# Patient Record
Sex: Female | Born: 1955 | Race: White | Hispanic: No | Marital: Married | Smoking: Former smoker
Health system: Southern US, Community
[De-identification: ages and names within clinical notes are randomized; demographics above are authoritative.]

## PROBLEM LIST (undated history)

## (undated) DIAGNOSIS — G4730 Sleep apnea, unspecified: Secondary | ICD-10-CM

## (undated) DIAGNOSIS — M722 Plantar fascial fibromatosis: Secondary | ICD-10-CM

## (undated) DIAGNOSIS — J189 Pneumonia, unspecified organism: Secondary | ICD-10-CM

## (undated) DIAGNOSIS — F32A Depression, unspecified: Secondary | ICD-10-CM

## (undated) DIAGNOSIS — T7840XA Allergy, unspecified, initial encounter: Secondary | ICD-10-CM

## (undated) DIAGNOSIS — E669 Obesity, unspecified: Secondary | ICD-10-CM

## (undated) DIAGNOSIS — M1990 Unspecified osteoarthritis, unspecified site: Secondary | ICD-10-CM

## (undated) DIAGNOSIS — F419 Anxiety disorder, unspecified: Secondary | ICD-10-CM

## (undated) DIAGNOSIS — D649 Anemia, unspecified: Secondary | ICD-10-CM

## (undated) DIAGNOSIS — J302 Other seasonal allergic rhinitis: Secondary | ICD-10-CM

## (undated) DIAGNOSIS — Z8619 Personal history of other infectious and parasitic diseases: Secondary | ICD-10-CM

## (undated) DIAGNOSIS — I10 Essential (primary) hypertension: Secondary | ICD-10-CM

## (undated) DIAGNOSIS — F329 Major depressive disorder, single episode, unspecified: Secondary | ICD-10-CM

## (undated) DIAGNOSIS — M797 Fibromyalgia: Secondary | ICD-10-CM

## (undated) DIAGNOSIS — A4902 Methicillin resistant Staphylococcus aureus infection, unspecified site: Secondary | ICD-10-CM

## (undated) HISTORY — DX: Essential (primary) hypertension: I10

## (undated) HISTORY — DX: Plantar fascial fibromatosis: M72.2

## (undated) HISTORY — PX: UPPER GASTROINTESTINAL ENDOSCOPY: SHX188

## (undated) HISTORY — DX: Allergy, unspecified, initial encounter: T78.40XA

## (undated) HISTORY — DX: Anxiety disorder, unspecified: F41.9

## (undated) HISTORY — DX: Personal history of other infectious and parasitic diseases: Z86.19

## (undated) HISTORY — PX: TONSILLECTOMY: SUR1361

---

## 1999-04-12 ENCOUNTER — Ambulatory Visit (HOSPITAL_COMMUNITY): Admission: RE | Admit: 1999-04-12 | Discharge: 1999-04-12 | Payer: Self-pay | Admitting: Orthopedic Surgery

## 2000-05-08 ENCOUNTER — Encounter: Payer: Self-pay | Admitting: Orthopedic Surgery

## 2000-05-08 ENCOUNTER — Encounter: Admission: RE | Admit: 2000-05-08 | Discharge: 2000-05-08 | Payer: Self-pay | Admitting: Orthopedic Surgery

## 2000-06-05 ENCOUNTER — Ambulatory Visit (HOSPITAL_COMMUNITY): Admission: RE | Admit: 2000-06-05 | Discharge: 2000-06-05 | Payer: Self-pay | Admitting: Orthopedic Surgery

## 2000-06-13 ENCOUNTER — Encounter: Admission: RE | Admit: 2000-06-13 | Discharge: 2000-07-18 | Payer: Self-pay | Admitting: Orthopedic Surgery

## 2000-10-24 HISTORY — PX: GASTRIC BYPASS: SHX52

## 2001-07-17 ENCOUNTER — Encounter: Payer: Self-pay | Admitting: Internal Medicine

## 2001-07-17 ENCOUNTER — Inpatient Hospital Stay (HOSPITAL_COMMUNITY): Admission: EM | Admit: 2001-07-17 | Discharge: 2001-07-18 | Payer: Self-pay | Admitting: *Deleted

## 2001-07-29 ENCOUNTER — Emergency Department (HOSPITAL_COMMUNITY): Admission: EM | Admit: 2001-07-29 | Discharge: 2001-07-29 | Payer: Self-pay

## 2001-08-08 ENCOUNTER — Encounter: Admission: RE | Admit: 2001-08-08 | Discharge: 2001-08-08 | Payer: Self-pay | Admitting: Family Medicine

## 2001-10-24 HISTORY — PX: SHOULDER ARTHROSCOPY: SHX128

## 2002-02-15 ENCOUNTER — Ambulatory Visit (HOSPITAL_BASED_OUTPATIENT_CLINIC_OR_DEPARTMENT_OTHER): Admission: RE | Admit: 2002-02-15 | Discharge: 2002-02-15 | Payer: Self-pay | Admitting: Orthopedic Surgery

## 2002-10-24 HISTORY — PX: KNEE ARTHROSCOPY: SHX127

## 2003-04-21 ENCOUNTER — Ambulatory Visit (HOSPITAL_BASED_OUTPATIENT_CLINIC_OR_DEPARTMENT_OTHER): Admission: RE | Admit: 2003-04-21 | Discharge: 2003-04-21 | Payer: Self-pay | Admitting: Orthopedic Surgery

## 2003-11-07 ENCOUNTER — Inpatient Hospital Stay (HOSPITAL_COMMUNITY): Admission: RE | Admit: 2003-11-07 | Discharge: 2003-11-12 | Payer: Self-pay | Admitting: Orthopedic Surgery

## 2003-11-07 HISTORY — PX: TOTAL KNEE ARTHROPLASTY: SHX125

## 2003-11-17 ENCOUNTER — Emergency Department (HOSPITAL_COMMUNITY): Admission: EM | Admit: 2003-11-17 | Discharge: 2003-11-17 | Payer: Self-pay | Admitting: Emergency Medicine

## 2004-02-05 ENCOUNTER — Ambulatory Visit: Admission: RE | Admit: 2004-02-05 | Discharge: 2004-02-05 | Payer: Self-pay | Admitting: Orthopedic Surgery

## 2004-03-29 ENCOUNTER — Inpatient Hospital Stay (HOSPITAL_COMMUNITY): Admission: RE | Admit: 2004-03-29 | Discharge: 2004-04-02 | Payer: Self-pay | Admitting: Orthopedic Surgery

## 2004-03-29 HISTORY — PX: TOTAL KNEE ARTHROPLASTY: SHX125

## 2004-11-30 ENCOUNTER — Ambulatory Visit: Payer: Self-pay | Admitting: Internal Medicine

## 2004-12-06 ENCOUNTER — Ambulatory Visit (HOSPITAL_COMMUNITY): Admission: RE | Admit: 2004-12-06 | Discharge: 2004-12-06 | Payer: Self-pay | Admitting: Internal Medicine

## 2004-12-08 ENCOUNTER — Ambulatory Visit: Payer: Self-pay | Admitting: Internal Medicine

## 2005-01-03 ENCOUNTER — Ambulatory Visit: Payer: Self-pay | Admitting: Internal Medicine

## 2005-02-17 ENCOUNTER — Emergency Department (HOSPITAL_COMMUNITY): Admission: EM | Admit: 2005-02-17 | Discharge: 2005-02-17 | Payer: Self-pay | Admitting: Emergency Medicine

## 2005-03-18 ENCOUNTER — Emergency Department (HOSPITAL_COMMUNITY): Admission: EM | Admit: 2005-03-18 | Discharge: 2005-03-18 | Payer: Self-pay | Admitting: *Deleted

## 2006-01-06 ENCOUNTER — Ambulatory Visit (HOSPITAL_BASED_OUTPATIENT_CLINIC_OR_DEPARTMENT_OTHER): Admission: RE | Admit: 2006-01-06 | Discharge: 2006-01-06 | Payer: Self-pay | Admitting: Orthopedic Surgery

## 2006-01-06 HISTORY — PX: ROTATOR CUFF REPAIR W/ DISTAL CLAVICLE EXCISION: SHX2365

## 2008-02-21 ENCOUNTER — Encounter: Admission: RE | Admit: 2008-02-21 | Discharge: 2008-02-21 | Payer: Self-pay

## 2008-05-09 ENCOUNTER — Ambulatory Visit (HOSPITAL_COMMUNITY): Admission: RE | Admit: 2008-05-09 | Discharge: 2008-05-10 | Payer: Self-pay | Admitting: Neurosurgery

## 2008-05-09 HISTORY — PX: ANTERIOR CERVICAL DECOMP/DISCECTOMY FUSION: SHX1161

## 2009-11-26 ENCOUNTER — Other Ambulatory Visit: Payer: Self-pay | Admitting: Internal Medicine

## 2009-11-26 ENCOUNTER — Ambulatory Visit: Payer: Self-pay | Admitting: Internal Medicine

## 2009-11-30 ENCOUNTER — Ambulatory Visit: Payer: Self-pay | Admitting: Internal Medicine

## 2009-12-03 ENCOUNTER — Ambulatory Visit: Payer: Self-pay | Admitting: Internal Medicine

## 2009-12-07 ENCOUNTER — Ambulatory Visit: Payer: Self-pay | Admitting: Specialist

## 2009-12-08 ENCOUNTER — Ambulatory Visit: Payer: Self-pay | Admitting: Internal Medicine

## 2009-12-10 ENCOUNTER — Ambulatory Visit: Payer: Self-pay | Admitting: Internal Medicine

## 2009-12-16 ENCOUNTER — Ambulatory Visit: Payer: Self-pay | Admitting: Internal Medicine

## 2009-12-17 ENCOUNTER — Ambulatory Visit: Payer: Self-pay | Admitting: Internal Medicine

## 2009-12-23 ENCOUNTER — Ambulatory Visit: Payer: Self-pay | Admitting: Internal Medicine

## 2009-12-24 ENCOUNTER — Ambulatory Visit: Payer: Self-pay | Admitting: Internal Medicine

## 2009-12-31 ENCOUNTER — Ambulatory Visit: Payer: Self-pay | Admitting: Internal Medicine

## 2010-07-23 ENCOUNTER — Ambulatory Visit (HOSPITAL_COMMUNITY): Admission: RE | Admit: 2010-07-23 | Discharge: 2010-07-23 | Payer: Self-pay | Admitting: Orthopedic Surgery

## 2010-07-30 ENCOUNTER — Inpatient Hospital Stay (HOSPITAL_COMMUNITY): Admission: RE | Admit: 2010-07-30 | Discharge: 2010-08-03 | Payer: Self-pay | Admitting: Orthopedic Surgery

## 2010-07-30 HISTORY — PX: TOTAL KNEE REVISION: SHX996

## 2010-10-24 DIAGNOSIS — G4730 Sleep apnea, unspecified: Secondary | ICD-10-CM

## 2010-10-24 HISTORY — DX: Sleep apnea, unspecified: G47.30

## 2011-01-05 LAB — WOUND CULTURE

## 2011-01-05 LAB — CROSSMATCH: Antibody Screen: NEGATIVE

## 2011-01-05 LAB — BASIC METABOLIC PANEL
BUN: 10 mg/dL (ref 6–23)
BUN: 12 mg/dL (ref 6–23)
BUN: 13 mg/dL (ref 6–23)
BUN: 6 mg/dL (ref 6–23)
CO2: 30 mEq/L (ref 19–32)
CO2: 30 mEq/L (ref 19–32)
Calcium: 8.6 mg/dL (ref 8.4–10.5)
Chloride: 102 mEq/L (ref 96–112)
Chloride: 103 mEq/L (ref 96–112)
Chloride: 104 mEq/L (ref 96–112)
Creatinine, Ser: 0.68 mg/dL (ref 0.4–1.2)
Creatinine, Ser: 0.8 mg/dL (ref 0.4–1.2)
GFR calc Af Amer: >60 mL/min (ref >60)
GFR calc non Af Amer: >60 mL/min (ref >60)
GFR calc non Af Amer: >60 mL/min (ref >60)
Glucose, Bld: 108 mg/dL — ABNORMAL HIGH (ref 70–99)
Glucose, Bld: 112 mg/dL — ABNORMAL HIGH (ref 70–99)
Potassium: 3.9 mEq/L (ref 3.5–5.1)
Potassium: 4.2 mEq/L (ref 3.5–5.1)
Sodium: 137 mEq/L (ref 135–145)

## 2011-01-05 LAB — CBC
HCT: 28.2 % — ABNORMAL LOW (ref 36.0–46.0)
Hemoglobin: 9.4 g/dL — ABNORMAL LOW (ref 12.0–15.0)
MCH: 29.6 pg (ref 26.0–34.0)
MCH: 29.7 pg (ref 26.0–34.0)
MCHC: 30.9 g/dL (ref 30.0–36.0)
MCHC: 31.6 g/dL (ref 30.0–36.0)
MCV: 94.8 fL (ref 78.0–100.0)
MCV: 95.3 fL (ref 78.0–100.0)
Platelets: 286 10*3/uL (ref 150–400)
Platelets: 336 10*3/uL (ref 150–400)
RBC: 3.98 MIL/uL (ref 3.87–5.11)
RDW: 14.3 % (ref 11.5–15.5)
RDW: 14.5 % (ref 11.5–15.5)
RDW: 14.6 % (ref 11.5–15.5)
RDW: 14.8 % (ref 11.5–15.5)
WBC: 6.9 10*3/uL (ref 4.0–10.5)

## 2011-01-05 LAB — ANAEROBIC CULTURE

## 2011-01-05 LAB — GRAM STAIN

## 2011-01-05 LAB — PROTIME-INR: Prothrombin Time: 13.6 seconds (ref 11.6–15.2)

## 2011-01-06 LAB — DIFFERENTIAL
Eosinophils Relative: 6 % — ABNORMAL HIGH (ref 0–5)
Lymphocytes Relative: 38 % (ref 12–46)
Monocytes Absolute: 0.5 10*3/uL (ref 0.1–1.0)
Monocytes Relative: 8 % (ref 3–12)
Neutro Abs: 3.2 10*3/uL (ref 1.7–7.7)

## 2011-01-06 LAB — TYPE AND SCREEN: ABO/RH(D): O POS

## 2011-01-06 LAB — APTT: aPTT: 33 seconds (ref 24–37)

## 2011-01-06 LAB — COMPREHENSIVE METABOLIC PANEL
AST: 25 U/L (ref 0–37)
Albumin: 4.3 g/dL (ref 3.5–5.2)
Alkaline Phosphatase: 86 U/L (ref 39–117)
BUN: 9 mg/dL (ref 6–23)
Chloride: 103 mEq/L (ref 96–112)
GFR calc Af Amer: >60 mL/min (ref >60)
Potassium: 4.3 mEq/L (ref 3.5–5.1)
Total Protein: 6.1 g/dL (ref 6.0–8.3)

## 2011-01-06 LAB — CBC
HCT: 35.8 % — ABNORMAL LOW (ref 36.0–46.0)
Hemoglobin: 11.4 g/dL — ABNORMAL LOW (ref 12.0–15.0)
MCH: 29.6 pg (ref 26.0–34.0)
MCHC: 31.8 g/dL (ref 30.0–36.0)
Platelets: 337 10*3/uL (ref 150–400)

## 2011-01-06 LAB — URINALYSIS, ROUTINE W REFLEX MICROSCOPIC
Glucose, UA: NEGATIVE mg/dL
Nitrite: NEGATIVE
Specific Gravity, Urine: 1.02 (ref 1.005–1.030)
pH: 5.5 (ref 5.0–8.0)

## 2011-01-06 LAB — SURGICAL PCR SCREEN
MRSA, PCR: NEGATIVE
Staphylococcus aureus: POSITIVE — AB

## 2011-03-08 NOTE — Op Note (Signed)
[name], [name]                 ACCOUNT NO.:  [id number]   MEDICAL RECORD NO.:  [id number]          PATIENT TYPE:  OIB   LOCATION:  3599                         FACILITY:  [facility]   PHYSICIAN:  Coletta Memos, M.D.     DATE OF BIRTH:  03-30-1956   DATE OF PROCEDURE:  DATE OF DISCHARGE:                               OPERATIVE REPORT   PREOPERATIVE DIAGNOSES:  Cervical spondylosis C5-6, C6-C7 and cervical  radiculopathy.   POSTOPERATIVE DIAGNOSES:  Cervical spondylosis C5-6, C6-C7 and cervical  radiculopathy.   PROCEDURE:  1. Anterior cervical decompression C5-C6, C6-C7.  2. Arthrodesis C5-C6, 6 mm allograft; C6-C7, 7 mm allograft.  3. Anterior instrumentation, Vector plating with 14-16 mm screws.   COMPLICATIONS:  None.   SURGEON:  Coletta Memos, MD   ASSISTANT:  Stefani Dama, MD   INDICATIONS:  [name] presented with a significant pain in the left  upper extremity along with weakness in the triceps and slightly in her  biceps.  After undergoing extensive conservative treatment, I therefore  offered and she agreed to undergo operative decompression.   OPERATIVE NOTE:  [name] was brought to the operating room, intubated,  and placed under general anesthetic without difficulty.  Head was placed  in essentially neutral position on a horseshoe headrest.  The neck was  prepped.  She was draped in a sterile fashion.  I infiltrated 4 mL 0.5%  lidocaine with 1:200,000 strength epinephrine into the cervical region,  starting at the level of the cricothyroid membrane.  I opened the skin  with a #10 blade, took this down to the platysma.  I opened the platysma  in a horizontal fashion and exposed the strap muscles.  I retracted the  strap muscles medially and the sternocleidomastoid laterally and was  able to delineate an avascular corridor to the cervical spine.  I placed  spinal needle __________.  I placed another spinal needle at both 4-5, 5-  6.  I then reflected the  longus colli muscles bilaterally.  I exposed  both C5-6 and C6-7.  I opened the disk spaces at both levels using a 15  blade.  I did some preliminary curetting.  I then turned my attention to  the C6-7 level.  I placed self-retaining retractors and distraction pins  1 at C6, the other at C7.  I then started with my decompression.   The cervical decompression of the C6-7 was performed using the  microscope, high-speed drill, and Kerrison punches along with curette.  I removed osteophytes and endplate until I was able to fully decompress  both C7 nerve roots on the right and left sides.  I did this and did a  great deal of drilling out in the uncovertebral joints to remove any  bony trapping of the nerve.  After the decompression of the nerve roots  and spinal canal had been completed, I then sized the space for a graft.  I also prepared for arthrodesis by using a barrel-shaped bur to even out  the retrieval bodies at C6 and at C7.   I completed the  arthrodesis by placing a 7-mm allograft at this level.  I achieved hemostasis using Surgifoam.  I then turned my attention to  the C5-6 level.  I removed the distraction pin from C7 and placed at C5.  I removed the self-retaining retractor.  I opened the disk space further  with the distraction pins and then again using curettes, pituitary  rongeurs, and the microscope I completed my decompression of both C6  nerve root and the spinal canal.  Osteophytes again were drilled down  and soft tissue was removed.  Certainly, it was overgrown as it had been  at C6-7.  After completing the decompression, I turned my attention to  the arthrodesis.   I used again Broussard burr to even out the surfaces at C5 and C6.  I  sized the space and placed a 6 mm structural allograft into this space.  I again was able to control bleeding.  I then turned my attention to the  anterior instrumentation.   I placed an anterior plate from U0-A5 using 16 mm screws at  C5, 14 mm  screws at C6-C7.  I used longer screws at C5 secondary to an osteophyte.  I kept the plate off the bone to some degree.  X-ray showed the plate  plug-in screws to be in good position.  I completed this with Dr.  Verlee Rossetti assistance using self-tapping screws at each level, 2 screws  placed at C5, 2 at C6, and 2 at C7.  I then irrigated the wound.  I then  closed wound in layered fashion using Vicryl sutures to reapproximate  the platysma in subcuticular layers.  Dermabond was used for sterile  dressing.  She was extubated moving all extremities well.           ______________________________  Coletta Memos, M.D.     KC/MEDQ  D:  05/09/2008  T:  05/10/2008  Job:  [id number]

## 2011-03-11 NOTE — Discharge Summary (Signed)
[place]  Patient:    [name], [name] Visit Number: [id number] MRN: [id number]          Service Type: MED Location: [id number] Attending Physician:  Doneta Public Dictated by:   Arlis Porta, M.D. Admit Date:  07/16/2001 Disc. Date: 07/18/01   CC:         Evlyn Clines, M.D., [facility]   Discharge Summary  DISCHARGE DIAGNOSES: 1. Surgical complications from gastric bypass and stapling. 2. Nausea and vomiting and dehydration.  SECONDARY DIAGNOSES: 1. Hypertension. 2. Allergic rhinitis. 3. Right rotator cuff problems. 4. She is also status post arthroscopic knee surgery x 3, twice on the left    and one on the right.  CONSULTATIONS:  None during her stay.  PROCEDURES: 1. She had an abdominal x-ray and chest x-ray done on July 17, 2001,    which showed no acute abdominal or pulmonary abnormalities. 2. She had a urinalysis on July 17, 2001, which showed specific gravity    1.023, pH of 6, glucose of 100, moderate bilirubin, 15 ketones, protein    greater than 300.  Urobilinogen 1, nitrate positive.  Small leukocyte    esterase, many epithelial cells, 7-10 white blood cells, 3-6 red blood    cells, many bacteria and hyalin casts.  She had blood cultures x 3 drawn    which were done on July 17, 2001, which showed one of them from her    left PICC line growing positive for gram negative rods and gram positive    cocci.  The other two blood cultures were negative growth to date. 3. The patient also had a CBC and complete metabolic panel drawn on July 17, 2001, which showed slightly elevated LFTs with an AST of 82, and an ALT    of 49, and alk. phos. of 115.  She also had hypokalemia with a potassium    level of 3.1.  Her CBC showed mild anemia at hemoglobin of 11.9, and    hematocrit of 35.  White blood cell count was 9.4.  HOSPITAL COURSE:  This is a 55 year old white female who underwent  gastric bypass stapling procedure in August of 2002, by Dr. Clovis Riley and who has since been put on TNA through a PICC line secondary to inability to tolerate p.o. The patient has nausea and vomiting for several weeks, and has since been getting worse.  She presented to the [facility] Emergency Room with a history of a fever of 101.4, feelings chills, and weak and listless.  The patient was then admitted to rehydrate her with intravenous fluids as well as to rule out any possible sepsis as a cause for her nausea and vomiting. She was also given potassium supplementation in her fluids.  Blood cultures and urine cultures were drawn which showed 1 blood culture positive for gram positive cocci and gram negative rods.  Her PICC line was then taken out of her arm and then she was started on IV Zosyn.  Her nausea was also treated with IV Phenergan with a good result as the patient only vomited once.  She was also given protonix to help relieve possible gastritis.  Throughout her stay she was kept n.p.o. and just maintained on IV fluids.  She remained afebrile and hemodynamically stable with normal and stable vital signs.  It was decided that the best care that she would receive was to be transferred back to [facility]  where she is known by her physicians there.  A phone call to Dr. Evlyn Clines was placed and he agreed to accept her as a patient in Dr. Ledell Peoples absence.  She will be transferred by [facility] and her care will be resumed at [facility].  DISCHARGE MEDICATIONS: 1. Phenergan 12.5 mg IV q6h. p.r.n. for nausea. 2. Toradol 15-30 mg IV q.6h p.r.n. for pain. 3. Zosyn 3.375 grams IV q.6h. 4. Protonix 40 mg IV q. day 5. Tylenol 650 mg per rectum q.6h. p.r.n. for pain and fever. 6. D-5 normal saline with 30 mEq of potassium chloride per liter running at    158 mL/hr.  ACTIVITY:  She is to maintain bed rest with bathroom privileges.  DIET:  She can eat a diet  as tolerated.  WOUND CARE:  FOLLOWUP:  She is being transferred to [facility] and to be taken care of by Dr. Evlyn Clines.  The phone number there is [phone]. Dictated by:   Arlis Porta, M.D. Attending Physician:  Doneta Public DD:  07/18/01 TD:  07/18/01 Job: [id number]

## 2011-03-11 NOTE — Discharge Summary (Signed)
NAMECONSANDRA, [name]                           ACCOUNT NO.:  [id number]   MEDICAL RECORD NO.:  [id number]                   PATIENT TYPE:  INP   LOCATION:  5005                                 FACILITY:  [facility]   PHYSICIAN:  Harvie Junior, M.D.                DATE OF BIRTH:  [birth year]   DATE OF ADMISSION:  03/29/2004  DATE OF DISCHARGE:  04/02/2004                                 DISCHARGE SUMMARY   ADMITTING DIAGNOSES:  1. End-stage degenerative joint disease left knee.  2. 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  3. Hypertension.  4. History of morbid obesity status post gastric bypass surgery August 2002.   DISCHARGE DIAGNOSES:  1. End-stage degenerative joint disease left knee.  2. 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  3. Hypertension.  4. History of morbid obesity status post gastric bypass surgery August 2002.  5. Postoperative blood loss anemia.   PROCEDURES:  Left total knee arthroplasty.   BRIEF HISTORY:  [name] is a 55 year old female who had an on-the-job  injury to both her knees.  She had pain in both knees and subsequently  underwent a right total knee replacement.  She did relatively well from  this, but had continued pain in her left knee with weightbearing.  X-rays of  left knee showed bone on bone changes with complete loss of joint space.  She got temporary relief with left knee arthroscopy.  She had night pain and  pain with ambulation and did not respond to exhaustive conservative  management of the non-operative type.  She was admitted for a left total  knee replacement.   LABORATORIES:  EKG on admission showed normal sinus rhythm.  EKG showed no  active disease.  Hemoglobin on admission was 9.9, hematocrit 30.4.  Indices  were within normal limits.  On postoperative day #1 her hemoglobin was 8.9,  postoperative day #2 7.8, postoperative day #3 8.6, and #4 8.4.  Pro time on  admission was 11.2 seconds with an  INR of 0.7, PTT 32.  On Coumadin therapy  her pro time was up to 15.1 seconds with an INR of 1.3.  CMET on admission  was within normal limits other than decreased calcium, total protein, and  albumin at 8.2, 5.1, and 3.2.  Urinalysis showed no abnormalities.  2 units  packed rbc's were transfused during this hospitalization.   HOSPITAL COURSE:  The patient underwent left total knee arthroplasty as well  described in Dr. Luiz Blare' operative note on March 29, 2004.  Immediately  postoperatively patient had a Marcaine epidural with 10 mL of 0.25% plain  Marcaine.  This improved her pain somewhat.  Her epidural catheter was  placed for pain control.  She was given IV Ancef 1 g q.8h. x5 doses.  On  postoperative day #1 she was gotten out of bed to the chair.  She  did  complain of quite a bit of left knee pain.  She was given her usual  OxyContin dose which she had taken preoperatively which was 80 mg b.i.d.  On  postoperative day #2 she had improvement in knee pain.  Her Foley catheter  which had been placed at time of surgery was intact.  She was out of bed to  the chair.  She had low grade fever of 100.0.  Her vital signs were stable.  Hemoglobin was 7.8.  Her INR was 1.0. Her left knee dressing was changed.  Her Hemovac drain was pulled.  The epidural was discontinued per anesthesia  and 2 units of packed rbc's were transfused.  She was continued with  physical therapy.  Was started on Lovenox six hours after the epidural was  discontinued subcutaneous.  Rehabilitation consult was obtained for possible  admission to inpatient rehabilitation floor.  She made progress with their  therapies.  On postoperative day #4 she had moderate knee pain.  She was  taking fluids, voiding without difficulty.  She was afebrile and vital signs  were stable.  Her hemoglobin was 8.4.  INR was 1.3.  Her saline lines were  discontinued.  She was discharged home in improved condition on a regular  diet.   DISCHARGE  MEDICATIONS:  1. Was given prescription for OxyContin 80 mg b.i.d.  2. OxyContin IR for breakthrough pain one q.6h. p.r.n. pain.  3. Robaxin 750 mg one t.i.d. p.r.n. spasm.  4. Coumadin for DVT prophylaxis x1 month postoperatively.  5. Lovenox 30 mg subcutaneous b.i.d. x3 days.   ACTIVITY:  Ambulating, weightbearing as tolerated on the left with a walker.  She will need home health PT three times a week and home CPM 0 degrees to 50  degrees, increase up to 90 degrees as tolerated.  She will follow up with  Dr. Luiz Blare in two weeks in the office, call sooner if any problems occur.      [name], P.A.                       Harvie Junior, M.D.    [name]  D:  06/01/2004  T:  06/02/2004  Job:  [id number]   cc:   Burnett Kanaris, PA  [facility] [address]

## 2011-03-11 NOTE — Op Note (Signed)
NAMEELAINNA, ESHLEMAN                           ACCOUNT NO.:  [id number]   MEDICAL RECORD NO.:  [id number]                   PATIENT TYPE:  INP   LOCATION:  2112                                 FACILITY:  [facility]   PHYSICIAN:  Harvie Junior, M.D.                DATE OF BIRTH:  Sep 02, 1956   DATE OF PROCEDURE:  11/07/2003  DATE OF DISCHARGE:                                 OPERATIVE REPORT   PREOPERATIVE DIAGNOSIS:  End-stage degenerative joint disease right knee.   POSTOPERATIVE DIAGNOSIS:  End-stage degenerative joint disease right knee.   PROCEDURE:  Right total knee replacement with DePuy LCS instrumentation,  large femur, size 4 cemented keel tibia and a 12.5-mm bridging bearing with  the 38-mm all poly patella.   SURGEON:  Harvie Junior, M.D.   ASSISTANT:  Bradley Ferris, M.D.   ANESTHESIA:  General.   INDICATIONS FOR PROCEDURE:  This is a 55 year old female with a long history  of having bilateral severe degenerative joint disease who ultimately tried  activity modification and injection therapy and anti-inflammatory  medication. None of this worked, and because of continued severe  degenerative joint disease end-stage of both knees, the patient is taken to  the operating room for total knee replacement.   Preoperatively the patient had significant problems with pain control, and  ultimately had been on  OxyContin 60 mg b.i.d., as well as oral pain  relievers, and felt that  there were going to be some issues with pain  control postoperatively, so she was planned for epidural anesthesia and this  was obtained preoperatively.   DESCRIPTION OF PROCEDURE:  The patient was taken to the operating room and  after adequate anesthesia was obtained with a general anesthetic, the  patient was placed on the operating table. Her  right leg was prepped and  draped in the usual sterile fashion.   Following  this a midline incision was made through the subcutaneous tissue  and taken  down to the level of the knee. A medial peripatellar arthrotomy  was then undertaken. The anterior and  posterior cruciates were removed as  well as the medial and lateral  meniscus.   Attention was turned to the tibia where a cut was made with a 10 degree  slope, perpendicular to the long axis of the tibia. At this point attention  was turned towards the femur, where the femur was sized, and then the  flexion gap was measured and  the anterior and posterior cuts were made. The  gaps were then measured and attention was turned towards the chamfer cuts  which were made as well as the anterior cut.   At this point the keel was then cut as well as a long peg hole. At this  point a trial tibia was put in place as well as a trial femur. The flexion-  extension gaps had been matched  during the surgery perfectly, and a 12.5-mm  bridging bearing was put in place. Excellent range of motion was achieved  with no instability.   Attention was turned towards the patella, where it was sized to a 38, and  the patella was then cut down to a level of 14 mm and a 38-mm patella was  put in place. Excellent range of motion was achieved at this point. All the  components were then removed. The knee was copiously irrigated with a pulse  lavage irrigation system.   The components were cemented into place, a large femur, size 4 cemented keel  tibia, 12.5-mm bridging bearing and a 38-mm all poly patella. Excellent fit  and fill with the components was achieved. The leg was held in 10 degrees of  flexion with compression while the cement was allowed to harden.   The patient was then taken through a range of motion and the medial  peripatellar arthrotomy was then closed after medium Hemovac drains were put  in place with #1 Vicryl. The skin was closed with 0 and 2-0 Vicryl and skin  staples. A sterile compressive dressing was applied at this point.   The patient was taken to the recovery room. She was noted to  be in  satisfactory condition. Estimated blood loss was from the procedure was not  significant.                                               Harvie Junior, M.D.    Ranae Plumber  D:  11/09/2003  T:  11/09/2003  Job:  [id number]

## 2011-03-11 NOTE — Op Note (Signed)
NAME:  [name], [name]                           ACCOUNT NO.:  [id number]   MEDICAL RECORD NO.:  [id number]                   PATIENT TYPE:  AMB   LOCATION:  DSC                                  FACILITY:  [facility]   PHYSICIAN:  Harvie Junior, M.D.                DATE OF BIRTH:  [birth year]   DATE OF PROCEDURE:  04/21/2003  DATE OF DISCHARGE:                                 OPERATIVE REPORT   PREOPERATIVE DIAGNOSIS:  Left knee degenerative changes with suspected  patellofemoral and medial compartment disease.   POSTOPERATIVE DIAGNOSIS:  1. Grade 4 chondromalacia medial femoral compartment.  2. Grade 2 and 3 change lateral femoral compartment.  3. Grade 2 and 3 change patellofemoral compartment.   OPERATION PERFORMED:  1. Debridement of medial femoral compartment.  2. Debridement of lateral femoral compartment.  3. Debridement of chondromalacia patella.   SURGEON:  Harvie Junior, M.D.   ASSISTANT:  Marshia Ly, P.A.   ANESTHESIA:  General.   INDICATIONS FOR PROCEDURE:  The patient is a 55 year old female with a long  history of having significant degenerative disease of her left knee.  She  had two previous left knee arthroscopies with some relief.  Because of  relief with these and recent weight loss, the patient was taken to the  operating room for the left knee arthroscopy.  Previous dictation was made  of her right knee arthroscopy.  This is a separate procedure.   DESCRIPTION OF PROCEDURE:  The patient was taken to the operating room and  after adequate anesthesia was obtained with a general anesthetic, the  patient was placed supine on the operating table.  The left leg was prepped  and draped in the usual sterile fashion.  Following this, the routine  arthroscopic examination of the knee revealed that there was obvious grade 4  change in the medial femoral compartment.  This was debrided back to a  smooth and stable rim.  There was some slight tendency towards the  posterior  horn of the meniscus to be pulled in but certainly not enough that I  considered a meniscal tear.  This was contoured down with suction shaver.  Following this, attention was turned to the lateral femoral compartment  where there was a grade 3 change in the lateral femoral compartment.  This  was debrided with  suction shaver back to a smooth and stable rim.  Attention was turned up into the patellofemoral joint where there was grade  two and three changes in the femoral trochlea.  This was debrided back to a  smooth and stable rim.  At this point the knee was copiously irrigated and  suctioned dry.  The arthroscopic  portals were closed with a bandage.  A sterile compressive dressing was  applied.  The patient was taken to the recovery room where she was noted to  be in satisfactory condition.  The estimated blood loss for this procedure  was none.                                                Harvie Junior, M.D.    Ranae Plumber  D:  04/21/2003  T:  04/21/2003  Job:  [id number]

## 2011-03-11 NOTE — Discharge Summary (Signed)
NAMESOPHY, MESLER                           ACCOUNT NO.:  [id number]   MEDICAL RECORD NO.:  [id number]                   PATIENT TYPE:  INP   LOCATION:  5032                                 FACILITY:  [facility]   PHYSICIAN:  Harvie Junior, M.D.                DATE OF BIRTH:  [birth year]   DATE OF ADMISSION:  11/07/2003  DATE OF DISCHARGE:  11/12/2003                                 DISCHARGE SUMMARY   ADMISSION DIAGNOSES:  1. End-stage degenerative joint disease, right knee.  2. End-stage degenerative joint disease, left knee.  3. History of gastric bypass August 2002 secondary to obesity.  4. History of bilateral knee arthroscopies June of 2004.  5. History of right shoulder surgery April of 2003.  6. Postoperative blood loss anemia.  7. Urinary tract infection.   PROCEDURE:  Right total knee arthroplasty, Jodi Geralds, M.D., November 07, 2003.   BRIEF HISTORY:  [name] is a 55 year old female who has a history of a  fall at work. She works with [employer]. She injured both knees  and subsequently had bilateral knee arthroscopies in June of 2004. 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 She got temporary relief with  arthroscopy, then her pain returned to the point where she could not  ambulate. She had night pain and pain with ambulation. We also did injection  therapy including cortisone and viscosupplementation injections. She  continued to have significant pain and disability. Weight bearing x-rays  showed bone on bone in both knees. She was felt to be a candidate for  bilateral knee replacement surgery, and based upon surgeon's decision for  optimal result, we will proceed with right total knee arthroplasty followed  at some point in the further with left total knee arthroplasty. We felt due  to her weight and medical condition that bilateral knee arthroscopy would  present some  inherent risk of suboptimal outcome with knee replacement  surgery.   PERTINENT LABORATORY DATA:  EKG on admission showed normal sinus rhythm with  inferolateral ST depression which had resolved. Hemoglobin on admission was  10.6. On postoperative day #1, hemoglobin was 8.1, on postoperative day #2  9.2, #3 9.5, on date of discharge 9.8. Protime on admission was 12.3 seconds  with an INR of 0.9, PTT was 35. On the day of discharge on Coumadin therapy,  protime was 22.9 seconds with an INR of 2.8. CMET on admission showed a  total protein of 5.9. Otherwise within normal limits. BMET was within normal  limits on postoperative day #1. Urinalysis on admission showed 3 to 6 WBCs  per high powered field and many bacteria.   HOSPITAL COURSE:  The patient underwent right total knee arthroplasty as  well described in Dr. Stacy Gardner operative note on November 07, 2003.  Postoperatively, the patient was  treated with an epidural fentanyl catheter  per anesthesia for pain control. She also was given Ancef 1 g IV q.8h.  prophylactically. CPM machine was used. Physical therapy for walker  ambulation, weight bearing as tolerated on the right side. On postoperative  day #1, she had a hemoglobin of 8.1, hematocrit of 24.1. CPM was increased.  Two units of packed RBCs were transfused due to this anemia which was felt  to be a blood loss anemia. The epidural catheter continued. We started her  on Coumadin on postoperative day #1 for DVT prophylaxis, and once the  epidural was pulled six hours later, the subcu Lovenox was started for DVT  prophylaxis. On postoperative day #2, she was afebrile and vital signs were  stable. Her hemoglobin was 9.2. Her dressing was changed, and Hemovac drain  was pulled. Therapy was advanced. The patient initially was admitted to ICU  for medical management, and close nursing care was felt to be medically  necessary. Her epidural fentanyl was pulled on postoperative day #2 by   anesthesia. On postop day #3, she was improving. Her INR was 1.6. Her  hemoglobin was 9.5. She had been started on a PCA pump for pain control as  pain control was a difficult problem for her at that time. She was then  converted over to p.o. analgesics. She continued to progress with physical  therapy. The patient continued to progress without any immediate  complications, and on postoperative day #5, November 12, 2003, she was  discharged home. She had complained of right knee pain. She was ambulating  the hall. Her vital signs were stable. She was afebrile. Her anemia was  benign. Her calf was soft. Her INR was 2.8, and her hemoglobin was 9.8. She  was discharged home in improved condition.   ACTIVITY:  _____________ ambulate weight bearing as tolerated on the right  with a knee immobilizer and walker. Home health physical therapy three times  per week was ordered with a home CPM and Coumadin management x1 month for  postoperative DVT prophylaxis.   MEDICATIONS AT DISCHARGE:  1. Lortab 10 mg #60 one to two q.6h. p.r.n. for breakthrough pain.  2. She had OxyContin 60 mg b.i.d. at home which she will continue on.   FOLLOW UP:  She will follow up with Dr. Luiz Blare in two weeks.      Marshia Ly, P.A.                       Harvie Junior, M.D.    Cordelia Pen  D:  12/31/2003  T:  01/02/2004  Job:  [id number]

## 2011-03-11 NOTE — Op Note (Signed)
NAME:  [name], [name]                           ACCOUNT NO.:  [id number]   MEDICAL RECORD NO.:  [id number]                   PATIENT TYPE:  AMB   LOCATION:  DSC                                  FACILITY:  [facility]   PHYSICIAN:  Harvie Junior, M.D.                DATE OF BIRTH:  09/24/1956   DATE OF PROCEDURE:  04/21/2003  DATE OF DISCHARGE:                                 OPERATIVE REPORT   PREOPERATIVE DIAGNOSIS:  Degenerative joint disease of right knee with  suspected medial meniscal tear.   POSTOPERATIVE DIAGNOSIS:  1. 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  2. Chondromalacia patella.  3. Chondromalacia lateral femoral condyle.   OPERATION PERFORMED:  1. Right knee arthroscopy with partial posterior horn medial meniscectomy     with debridement of corresponding medial compartment degenerative     disease.  2. Debridement of lateral femoral condyle condylar disease.  3. Debridement of patellofemoral disease.   SURGEON:  Harvie Junior, M.D.   ASSISTANT:  Marshia Ly, P.A.   ANESTHESIA:  General.   INDICATIONS FOR PROCEDURE:  The patient is a 55 year old female with a long  history of having severe bilateral knee disease.  She ultimately had been  evaluated previously arthroscopically and noted to have some degenerative  changes.  X-rays showed narrowing.  She has had significant pain.  She  ultimately had recently undergone a weight loss procedure and had lost  greater than 100 pounds.  Because of continued complaints of knee pain and  desire not to proceed with knee replacement surgery, the patient was brought  to the operating room for operative knee arthroscopy.   DESCRIPTION OF PROCEDURE:  The patient was brought to the operating room and  after adequate anesthesia was obtained with a general anesthetic, the  patient was placed supine on the operating table.  The right leg was prepped  and draped in the usual sterile  fashion.  Following this, routine  arthroscopic examination of the knee revealed that there were obvious  degenerative changes in the medial compartment.  There was a displaceable  posterior horn medial meniscal tear.  This was debrided back to a smooth and  stable rim and once this was accomplished, attention was turned and the  remaining meniscal rim was contoured down with a suction shaver.  Following  this, attention was turned to the medial compartment where there was grade 4  change over a signficant area in the medial tibial plateau and the medial  femoral condyle.  These were debrided to a smooth and stable rim.  Attention  was turned laterally where there was a large osteochondral defect in the  flexion area of the lateral femoral condyle which was debrided to a smooth  and stable rim.  Attention was then turned to the patellofemoral joint where  there was  noted to be some grade 2 and grade 3 changes in the patellofemoral  joint.  This was debrided to a smooth and stable rim from both the medial  and lateral portal.  At this point the knee was copiously irrigated and  suctioned  dry.  The arthroscopic portals were closed with a bandage, and sterile  compressive dressing was applied.  The patient was taken to the recovery  room where she was noted to be in satisfactory condition.  The estimated  blood loss for this procedure was none.                                                Harvie Junior, M.D.    Ranae Plumber  D:  04/21/2003  T:  04/21/2003  Job:  [id number]

## 2011-03-11 NOTE — H&P (Signed)
[facility]  Patient:    [name], [name] Visit Number: [id number] MRN: [id number]          Service Type: MED Location: 1800 1830 01 Attending Physician:  Carmelina Peal Dictated by:   Theressa Millard, M.D. Admit Date:  07/16/2001   CC:         Urgent Care   History and Physical  HISTORY OF PRESENT ILLNESS:  Ms. Lotspeich is a 55 year old white female admitted with nausea, vomiting, fever, and hypotension.  Her regular physicians are at Urgent Care.  She is morbidly obese.  This is complicated by mild hypertension, treated intermittently with hydrochlorothiazide, knee problems, low back pain, and lower extremity edema.  She had a laparoscopic gastric stapling and gastrojejunal bypass at [facility] [place]) by Dr. Clovis Riley on June 11, 2001.  She went home on June 17, 2001, and was able to consume liquids adequately.  In early September, per instructions, she tried to eat some soft foods.  However, she developed nausea and vomiting, and she has basically been unable to eat or drink due to nausea and vomiting for the past three weeks.  She has been hospitalized since in September for nausea and vomiting, varying blood pressure, but no fever.  Dr. Clovis Riley has done two esophagogastroduodenoscopies but has been unable to find the gastric pouch exit stoma due to edema.  Her last EGD was on July 08, 2001, and she has been on IV Protonix since starting TPN (see below).  She has also been on Reglan for the last few days.  On July 06, 2001, a PICC line was placed.  She has been on nocturnal TPN, receiving 2600 cc over 12 hours.  Until last night this included lipids. However, the last three nights, Saturday, Sunday, and Monday night, she has developed nausea and vomiting.  This has occurred five hours, three hours, and one hour (respectively) after starting TPN.  As indicated above, the formulation included lipids  until last night when lipids were removed.  The infusion was prepared by [facility] in [place], [place], and administered by [employer] R.N.s or the patients husband.  Tonight, in addition to nausea and vomiting, she had chills, temperature of 101.4, felt listless and week, and they decided to come to the hospital.  She denies abdominal pain, although there is some soreness, cough, dyspnea, chest pain, and dysuria.  PAST MEDICAL HISTORY: 1. Hypertension. 2. Allergic rhinitis. 3. Morbid obesity (weight 330 at surgery, down to 300 since then). 4. Right rotator cuff problems.  PAST SURGICAL HISTORY: 1. Laparoscopic gastric stapling and bypass. 2. Arthroscopic knee surgery x 3, twice on the left, once on the right.  MEDICATION PRIOR TO SURGERY:  Zyrtec, Vioxx, hydrochlorothiazide.  ALLERGIES:  None known.  SOCIAL HISTORY:  Smoking, quit 15-20 years ago.  Alcohol, less than 1 per month.  She is a Geophysicist/field seismologist at [employer].  She has a very supportive husband.  She lives in [place].  FAMILY HISTORY:  Mother had a myocardial infarction at age 86, now has an automated implantable cardiac defibrillator.  Father alive and well.  There is obesity in several family members.  REVIEW OF SYSTEMS:  Headaches, which she attributes to her allergies.  No localizing symptoms for source of fever.  All other systems are negative.  PHYSICAL EXAMINATION:  GENERAL:  Obese.  In no acute distress.  SKIN:  Warm and dry.  VITAL SIGNS:  Temperature 98.3, blood pressure  initially 87/31 and up to 124/40 after IV fluids, pulse initially 130 and down to 100 after IV fluids.  HEENT:  Eyes have pupils which are round and reactive to light.  Extraocular movements are intact.  Ears are normal.  Nose and throat unremarkable except for dry mucous membranes.  NECK:  Supple.  Thyroid is not enlarged or tender.  CHEST:  Clear to auscultation and percussion.  CARDIAC:   Regular rhythm and rate with normal S1 and S2, ______ S3 or S4. There is a 2/6 systolic ejection murmur.  ABDOMEN:  Soft, with mild upper abdominal tenderness.  Incisions are healing nicely.  EXTREMITIES:  Without cyanosis, clubbing, or edema.  There is no calf tenderness.  NEUROLOGIC:  Alert and oriented x 3.  Cranial nerves are intact.  Motor is 5/5.  LABORATORY DATA:  UA revealed 7-10 wbcs per high-power field with 3-6 rbcs per high-power field with many squamous cells.  Hemoglobin 11.9, white count 9400, platelets 397,000.  Sodium 142, potassium 3.2, chloride 104, CO2 22, BUN 23, creatinine 1.2.  Chest x-ray and acute abdominal series:  No acute disease.  IMPRESSION: 1. Nausea and vomiting postlaparoscopic gastric stapling and bypass.  This is    presumably due to edema of the exit stoma.  The patient states that    Dr. Clovis Riley thinks this will improve.  Will continue Protonix or similar    PPI for now.  May need to call Dr. Clovis Riley about further evaluation of    this as needed.  The patient will need more TNA, but this has not been    ordered by me yet. 2. Hypotension.  This has already been largely corrected with IV fluids.    Differential diagnosis includes hypovolemia secondary to nausea and    vomiting and inability to receive adequate TNA x 3 days; systemic immune    response syndrome (tachycardia and fever present) although source of    infection not clear.  I suspect this is more hypovolemia than SIRS, but    the fever is concerning. 3. Fever.  Blood cultures x 2 done along with urine culture.  Differential    diagnosis includes UTI, PICC line infection, TNA contamination, abdominal    source postsurgery.  Workup could include culture of TNA, abdominal CT, or    other tests, depending upon the results of her blood cultures.  PICC line    discontinuation should be considered.  She is on Zosyn. 4. Pyuria.  This does show evidence of some contamination.  Urine cultures  are    pending.  5. Hypokalemia.  Potassium added to IV fluids. 6. Morbid obesity. 7. History of hypertension. 8. Allergic rhinitis.  The patient has been pan-cultured, IV fluids have been ordered, Zosyn started. I have discussed this with the family practice teaching service, who will assume care in the morning as the patient is cared for by Urgent Care. Dictated by:   Theressa Millard, M.D. Attending Physician:  Carmelina Peal DD:  07/17/01 TD:  07/17/01 Job: [id number]

## 2011-03-11 NOTE — Op Note (Signed)
NAME:  [name], [name]                           ACCOUNT NO.:  [id number]   MEDICAL RECORD NO.:  [id number]                   PATIENT TYPE:  INP   LOCATION:  5005                                 FACILITY:  [facility]   PHYSICIAN:  Harvie Junior, M.D.                DATE OF BIRTH:  Apr 17, 1956   DATE OF PROCEDURE:  03/29/2004  DATE OF DISCHARGE:                                 OPERATIVE REPORT   PREOPERATIVE DIAGNOSIS:  End stage degenerative joint disease, left knee.   POSTOPERATIVE DIAGNOSIS:  End stage degenerative joint disease, left knee.   OPERATION PERFORMED:  Left total knee replacement with a [employer]  Sigma knee series, a size 3 femur, a size 4 cemented keel tibia, 12.5 mm  bridging bearing and a 38 mm all polyethylene patella.   SURGEON:  Harvie Junior, M.D.   ASSISTANT:  Marshia Ly, P.A.   ANESTHESIA:  General.   INDICATIONS FOR PROCEDURE:  [name] is a 55 year old female with a history  of having had severe end stage degenerative joint disease.  She underwent a  right total knee replacement and tolerated this reasonably well.  Because of  continuing complaints in her left total knee, she is brought back now for  staged total knee replacement.   DESCRIPTION OF PROCEDURE:  The patient was brought to the operating room.  After adequate anesthesia obtained with general anesthetic, the patient was  placed supine on the operating table.  The leg was then prepped and draped  in the usual sterile fashion.  Following this, a midline incision was made.  Subcutaneous tissue dissected down to the level of the extensor mechanism,  medial parapatellar arthrotomy was undertaken.  Medial and lateral  meniscectomy were performed as well as an anterior posterior cruciate  excision.  At this point the tibia was cut perpendicular to its long axis.  Attention was then turned to the femur where a distal femoral cut was made  with a 5 degree valgus alignment.  A 12.5 mm guide  could be put in place and  the patient easily brought into full extension.  Attention was then turned  to the sizing of the femoral guide.  It really was between a 3 and a 4.  We  were concerned about overstuffing her as she had problems with motion on the  opposite side and so we elected to do a size 3 which seemed to fit best in  the medial and lateral direction.  The size 3 guide was then used and  anterior and posterior cuts were made and Chamfers were made.  At this point  the flexion gap was identified and noted to be easily 12.5 mm.  At that  point attention was turned towards the Chamfer cuts which were made.  Anterior posterior cuts were made and the attention was then turned to the  tibia which was then  sized as a 4, cemented and keeled and the keel was  punched in.  The box cut was made on the femur prior to doing this.  At this  point attention was turned to the patella where the patella was cut down to  the level of the patellar and quadriceps tendon.  At this point the 38 mm  patella was chosen and the drill holes were made for this.  Attention was  then turned back to removing all the trial components.  Full range of motion  was achieved without problems, easy full flexion, easy full extension and no  medial lateral instability.  No anterior posterior instability __________  test in flexion.  At this point attention was turned to removing all of the  trial components and the final components were cemented into place.  A size  3 cemented tibia, a size 4 tibia with a polyethylene central portion, a 12.5  mm bridging bearing and a 38 mm all poly patella.  The knee was put through  a range of motion.  Excellent range of motion was achieved.  At this point  the medium Hemovac drain was put in place after all the cement was allowed  to harden and also excess cement was removed with cement tools.  Once this  was allowed to harden, attention was turned towards the closure.  #1   Ethibond closure was taken closing the medial and lateral parapatellar  situation.  The wound was then copiously irrigated and suctioned dry.  Skin  closure with 0 and 2-0 Vicryl and skin with skin staples.  Sterile  compressive dressing was applied as well as a knee immobilizer.  The patient  was taken to the recovery room where she was noted to be in satisfactory  condition.  The estimated blood loss for this procedure was none.                                               Harvie Junior, M.D.    Ranae Plumber  D:  03/29/2004  T:  03/30/2004  Job:  [id number]

## 2011-03-11 NOTE — Op Note (Signed)
[name], [name]                           ACCOUNT NO.:  [id number]   MEDICAL RECORD NO.:  [id number]                   PATIENT TYPE:  INP   LOCATION:  2550                                 FACILITY:  [facility]   PHYSICIAN:  Zenon Mayo, MD            DATE OF BIRTH:  Jun 17, 1956   DATE OF PROCEDURE:  11/07/2003  DATE OF DISCHARGE:                                 OPERATIVE REPORT   PROCEDURE:  Lumbar epidural.   INDICATIONS:  Postop pain management status post total knee replacement.   DESCRIPTION:  Ms. Berry is a 55 year old female with a history of obesity  and chronic pain secondary to degenerative joint disease in her knees.  She  was brought to the operating room today by Dr. Luiz Blare for a right total knee  replacement.  Prior to the procedure, the patient was counseled on various  forms of postoperative pain management, and the patient requested epidural  anesthesia for her postoperative pain control.  Prior to going to the  operating room, the patient was placed in the sitting position in the  holding room and her back was sterilely prepped and draped for epidural  placement.  An 18-gauge Tuohy needle was inserted into the L3-4 interspace  with loss of resistance to air at 9 cm.  The catheter was threaded easily  into the epidural space and was left in the space at 6 cm and taped to the  skin at 16 cm.   After placement of the catheter, there was negative aspiration of heme or  CSF and a negative IV test dose with 3 cc of 2% lidocaine with 1:200,000 epi  was administered.  After the negative IV test dose, an additional 3 cc of  the same solution was given via the epidural.   Over the course of the next 10-15 minutes, the patient described some  sensory changes in her lower extremities.  There was a delay between  placement of the epidural catheter and going back to the operating room for  the procedure, so no more medicine was given at this time.  Upon proceeding  to  the operating room, an additional 12 cc of 2% Xylocaine was administered  via the epidural catheter in 5 cc increments over a period of 15 minutes.  The patient continued to have a sensory block to cold but had no motor  blocked whatsoever.  At this point we decided to proceed with a general  anesthetic for the case and to use the epidural postoperatively.   At the conclusion of the case, the patient had once again no motor block,  and this time she had no sensory block as well.  The patient was  hypertensive and tachycardic upon extubation and it was decided to replace  the epidural at that time.  The patient was placed in the right lateral  decubitus position and sedated with narcotics for pain control  as well as  some propofol.  The epidural space was again located at the L3-4 interspace  with loss of resistance to air at 9 cm.  The epidural catheter once again  threaded easily and was left in the epidural space at 6 cm and taped to the  skin at 16 cm.  There was once again negative aspiration for heme or CSF and  a negative IV test dose was once again administered.  The epidural catheter  was then bolused to a total of 15 cc over the next 20 minutes and once again  dosed incrementally and the epidural catheter was taped into place and the  patient was placed supine.  An epidural infusion of 1/16th percent Marcaine  with 5 mcg per mL of Fentanyl was begun at 15 cc an hour and the patient was  taken to the recovery room in stable fashion.  We will continue to follow  this epidural throughout its course.                                               Zenon Mayo, MD    WEF/MEDQ  D:  11/07/2003  T:  11/08/2003  Job:  [id number]

## 2011-03-11 NOTE — Op Note (Signed)
[facility]  Patient:    [name], [name] Visit Number: [id number] MRN: [id number]          Service Type: DSU Location: [facility] Attending Physician:  Milly Jakob Dictated by:   Harvie Junior, M.D. Proc. 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  POSTOPERATIVE DIAGNOSIS: 1. Anterolateral impingement acromioclavicular joint arthritis with partial    rotator cuff tear. 2. Posterior labral tear.  OPERATION PERFORMED: 1. Anterolateral acromioplasty with debridement of rotator cuff bursal    inflammation. 2. Distal clavicle excision, arthroscopic. 3. Posterolabral debridement from within the glenohumeral joint.  SURGEON:  Harvie Junior, M.D.  ASSISTANT:  Currie Paris. Thedore Mins.  ANESTHESIA:  General.  INDICATIONS FOR PROCEDURE:  The patient is a 55 year old female with a long history of having had anterolateral impingement.  She had an MRI which showed that she had a partial thickness rotator cuff tear but ultimately because of continued complaints of pain, came for acromioplasty.  She was brought to the operating room for this procedure.  DESCRIPTION OF PROCEDURE:  The patient was brought to the operating room where adequate anesthesia was obtained with general anesthetic.  The patient was placed upon the operating table.  The right shoulder was prepped and draped in the usual sterile fashion.  Following this, a routine arthroscopic examination of the shoulder revealed that there was a posterior labral tear.  This was debrided from the anterior portal.  The biceps tendon was well anchored.  The rotator cuff looked pristine from the under surface.  We took a good look posteriorly and did not find any significant posterior rotator cuff tear.  I think this was actually tissue from the  posterior labrum which was projecting up onto the rotator cuff.  The posterior labrum was resected from within the glenohumeral joint.  At this point the cannulas were removed and attention was turned into the subacromial space where a large shoulder cannula was introduced into the subacromial space.  In the subacromial space there was significant anterior more so than lateral impingement, dramatic anterior spurring.  At this point a 6 mm bur was used to resect the anterior portion of the acromion and once the anterior portion of the acromion was resected, attention was turned to the distal clavicle where 15 mm of distal clavicle were excised through the lateral as well as the anterior portal.  At this point the camera was changed to the lateral portal and the cutting block technique was used posteriorly to make sure that there was a flat acromion and the acromion had been flattened nicely.  It was also beveled slightly laterally.  At this point attention was then turned back to the camera to the posterior portal and a significant bursectomy was undertaken as well as removing any remaining bony fragments.  Once the bursectomy was completed, attention was turned back towards the acromioplasty, the distal clavicle excision and the final check was made to make sure that these were in fact done well and the final debridement of the rotator cuff was undertaken.  At this point the shoulder was copiously irrigated and suctioned dry.  The arthroscopic portals were closed with a bandage.  A sterile compressive dressing was applied.  The patient  was taken to the recovery room where she was noted to be in satisfactory condition.  Estimated blood loss for this procedure was none. Dictated by:   Harvie Junior, M.D. Attending Physician:  Milly Jakob DD:  02/15/02 TD:  02/15/02 Job: [id number]

## 2011-03-11 NOTE — Op Note (Signed)
NAMESURAH, PELLEY                 ACCOUNT NO.:  [id number]   MEDICAL RECORD NO.:  [id number]          PATIENT TYPE:  AMB   LOCATION:  DSC                          FACILITY:  [facility]   PHYSICIAN:  Harvie Junior, M.D.   DATE OF BIRTH:  12-14-1955   DATE OF PROCEDURE:  01/06/2006  DATE OF DISCHARGE:                                 OPERATIVE REPORT   PREOPERATIVE DIAGNOSIS:  Rotator cuff tear with impingement.   POSTOPERATIVE DIAGNOSIS:  Rotator cuff tear with impingement from both the  anterolateral acromion as well as the undersurface of the clavicle.   OPERATION PERFORMED:  1.  Miniopen rotator cuff repair of a high grade partial thickness rotator      cuff tear.  2.  Anterolateral acromioplasty.  3.  Distal clavicle resection by way of coplaning.   SURGEON:  Harvie Junior, M.D.   ASSISTANT:  Marshia Ly, P.A.   ANESTHESIA:  General.   INDICATIONS FOR PROCEDURE:  Ms. Agro is a 55 year old female with a long  history of having had significant right shoulder pain.  She has had previous  subacromial decompression and distal clavicle resection.  She began having  increasing complaints of pain.  Injection therapy seemed to help but because  of recurrence of pain she was ultimately taken to the operating room for  subacromial decompression and evaluation as needed.   DESCRIPTION OF PROCEDURE:  The patient was taken to the operating room and  after adequate anesthesia was obtained with general anesthetic, the patient  was placed supine on the operating table and then moved to the beach chair  position.  All bony prominences were well padded.  Attention was turned to  the right shoulder where after routine prep and drape, routine arthroscopic  examination revealed that the glenohumeral joint looked without abnormality  and the ball-socket joint and biceps tendon was within normal limits.  Attention was then turned to the rotator cuff just posterior to the biceps  tendon which  was noted on MRI to have a high grade partial thickness tear.  There were some torn fibers here.  This was debrided minimally with a sucker  shaver and once this was accomplished, this was marked with a Prolene  suture.  Attention was turned now to the glenohumeral joint and the  subacromial space.  There was some impinging acromion which was debrided.  The distal clavicle was also impinging although the distal joint had been  taken, there was some impingement from the clavicle and this was debrided by  way of a clavicular coplane.  Rotator cuff was then identified from the  superior surface when suture placement was identified and we with a probe  could easily put a probe through the cuff at this point and felt that the  high grade partial thickness nature of this tear was that this needed to be  tacked down. At this point the case was abandoned arthroscopically and a  small incision was made over the lateral aspect of the shoulder.  Subcutaneous tissue dissected down to the level of the cuff and the  cuff was  then identified and a rongeur was used to debride out the torn portions of  the cuff and the full nature of the tear was identified.  The undersurface  portions of the cuff were debrided.  The lateral bone was debrided at the  area where the cuff was inserted over the greater tuberosity and once this  was all accomplished and the bed was prepared, an awl was used to create a  pilot hole and then a single 5 mm anchor was used.  The cuff was then  grasped about 17 mm medial to the placement of the anchor and the rotator  cuff was pulled over into this area.  Excellent fixation was achieved with  these #2 FiberWires tied over the anchor and once this was done, single  limbs of this were able to be advanced posteriorly through some cuff pulling  the knots down laterally.  At this point once this had been completed, the  wound was copiously irrigated and suctioned dry. A finger was used to  make  sure there was no impingement on the cuff underneath.  There was none.  Arm  was put through a range of motion, could achieve full range of motion and at  this point the deltoid was closed with 1 Vicryl running suture, subcu with 2-  0 Vicryl and the skin with 3-0 Maxon pull out suture.  Benzoin and Steri-  Strips were applied.  Sterile compressive dressing was applied.  The patient  was taken to the recovery room where she was noted to be in satisfactory  condition.  The estimated blood loss for this procedure was none.      Harvie Junior, M.D.  Electronically Signed     JLG/MEDQ  D:  01/06/2006  T:  01/07/2006  Job:  [id number]

## 2011-07-22 LAB — COMPREHENSIVE METABOLIC PANEL
AST: 21
Albumin: 3.9
Calcium: 9.1
Creatinine, Ser: 0.96
GFR calc Af Amer: >60

## 2011-07-22 LAB — CBC
MCHC: 32.9
MCV: 88.6
Platelets: 404 — ABNORMAL HIGH

## 2012-01-13 ENCOUNTER — Ambulatory Visit: Payer: Self-pay | Admitting: Family Medicine

## 2012-05-09 ENCOUNTER — Other Ambulatory Visit: Payer: Self-pay | Admitting: Orthopedic Surgery

## 2012-05-14 ENCOUNTER — Encounter (HOSPITAL_BASED_OUTPATIENT_CLINIC_OR_DEPARTMENT_OTHER): Payer: Self-pay | Admitting: *Deleted

## 2012-05-15 ENCOUNTER — Encounter (HOSPITAL_BASED_OUTPATIENT_CLINIC_OR_DEPARTMENT_OTHER): Payer: Self-pay | Admitting: *Deleted

## 2012-05-16 ENCOUNTER — Encounter (HOSPITAL_BASED_OUTPATIENT_CLINIC_OR_DEPARTMENT_OTHER): Payer: Self-pay | Admitting: *Deleted
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
## 2012-05-16 NOTE — Progress Notes (Signed)
Did review sleep study and non compliance cpap-ok-may need to stay [facility]

## 2012-05-18 ENCOUNTER — Encounter (HOSPITAL_BASED_OUTPATIENT_CLINIC_OR_DEPARTMENT_OTHER): Admission: RE | Payer: Self-pay | Source: Ambulatory Visit

## 2012-05-18 ENCOUNTER — Ambulatory Visit (HOSPITAL_BASED_OUTPATIENT_CLINIC_OR_DEPARTMENT_OTHER)
Admission: RE | Admit: 2012-05-18 | Payer: BC Managed Care – PPO | Source: Ambulatory Visit | Admitting: Orthopedic Surgery

## 2012-05-18 HISTORY — DX: Anemia, unspecified: D64.9

## 2012-05-18 HISTORY — DX: Sleep apnea, unspecified: G47.30

## 2012-05-18 HISTORY — DX: Depression, unspecified: F32.A

## 2012-05-18 HISTORY — DX: Other seasonal allergic rhinitis: J30.2

## 2012-05-18 HISTORY — DX: Unspecified osteoarthritis, unspecified site: M19.90

## 2012-05-18 HISTORY — DX: Obesity, unspecified: E66.9

## 2012-05-18 HISTORY — DX: Major depressive disorder, single episode, unspecified: F32.9

## 2012-05-18 SURGERY — ARTHROPLASTY, FINGER
Anesthesia: General | Laterality: Left

## 2013-07-16 ENCOUNTER — Telehealth: Payer: Self-pay

## 2013-07-16 ENCOUNTER — Ambulatory Visit (INDEPENDENT_AMBULATORY_CARE_PROVIDER_SITE_OTHER): Payer: BC Managed Care – PPO | Admitting: Physician Assistant

## 2013-07-16 VITALS — BP 122/84 | HR 96 | Temp 98.5°F | Resp 18 | Ht 66.0 in | Wt 256.0 lb

## 2013-07-16 DIAGNOSIS — R51 Headache: Secondary | ICD-10-CM

## 2013-07-16 DIAGNOSIS — R519 Headache, unspecified: Secondary | ICD-10-CM

## 2013-07-16 DIAGNOSIS — S0180XA Unspecified open wound of other part of head, initial encounter: Secondary | ICD-10-CM

## 2013-07-16 NOTE — Telephone Encounter (Signed)
Please advise 

## 2013-07-16 NOTE — Patient Instructions (Signed)
WOUND CARE Please return in 5 days to have your stitches/staples removed or sooner if you have concerns.  Keep area clean and dry for 24 hours. Do not remove bandage, if applied.  After 24 hours, remove bandage and wash wound gently with mild soap and warm water. Reapply a new bandage after cleaning wound, if directed.  Continue daily cleansing with soap and water until stitches/staples are removed.  Do not apply any ointments or creams to the wound while stitches/staples are in place, as this may cause delayed healing.  Notify the office if you experience any of the following signs of infection: Swelling, redness, pus drainage, streaking, fever >101.0 F  Notify the office if you experience excessive bleeding that does not stop after 15-20 minutes of constant, firm pressure.  
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
## 2013-07-16 NOTE — Telephone Encounter (Signed)
PT WAS SEEN BY SARAH EARLIER TODAY AND WOULD LIKE TO HAVE A MUSCLE RELAXER CALLED IN FOR HER PLEASE CALL [phone]   [facility]

## 2013-07-17 ENCOUNTER — Telehealth: Payer: Self-pay

## 2013-07-17 ENCOUNTER — Encounter: Payer: Self-pay | Admitting: Radiology

## 2013-07-17 MED ORDER — CYCLOBENZAPRINE HCL 10 MG PO TABS: 10.0000 mg | ORAL_TABLET | Freq: Three times a day (TID) | ORAL | Status: DC | PRN

## 2013-07-17 NOTE — Telephone Encounter (Signed)
Pt CB and reported that she is having a lot of muscle soreness and tightness in her hip and arm where she has bruising from fall. She actually had some cramping that went down into her thigh from hip which eased off some after taking some K+. Pt has used Flexeril 10 mg several times in the past for muscle spasms/tightness which always helps, but does not have any at this time. She stated that the hydrocodone is helping some but feels that the muscle relaxer would help further.

## 2013-07-17 NOTE — Telephone Encounter (Signed)
This is duplicate message. See other.

## 2013-07-17 NOTE — Telephone Encounter (Signed)
Since she did have a bite, I need to make sure there is no redness or drainage, etc. Do expect swelling/ will try to call again. Line still busy.

## 2013-07-17 NOTE — Telephone Encounter (Signed)
Expect swelling. Called line busy.

## 2013-07-17 NOTE — Telephone Encounter (Signed)
Pt saw Benny Lennert yesterday and had stitches put in by eye.  Woke up and its swollen. Wants to know if she can put a bandage over it to keep it closed. Would also like a muscle relaxer prescribed if possible. Call at [phone].

## 2013-07-17 NOTE — Telephone Encounter (Signed)
I don't see wear a muscle relaxer was discussed at her visit with Sarah, and I am not sure why she would need this for the injury she sustained.  Why does pt want muscle relaxer?

## 2013-07-17 NOTE — Telephone Encounter (Signed)
Called line busy.

## 2013-07-17 NOTE — Telephone Encounter (Signed)
Sent flexeril to pharmacy 

## 2013-07-18 NOTE — Telephone Encounter (Signed)
Notified pt Rx was sent and she stated that the pharm let her know and she slept much better last night after taking the flexeril.

## 2013-07-22 ENCOUNTER — Ambulatory Visit (INDEPENDENT_AMBULATORY_CARE_PROVIDER_SITE_OTHER): Payer: BC Managed Care – PPO | Admitting: Physician Assistant

## 2013-07-22 VITALS — BP 120/92 | HR 86 | Temp 98.7°F | Resp 16 | Ht 66.0 in | Wt 256.0 lb

## 2013-07-22 DIAGNOSIS — I998 Other disorder of circulatory system: Secondary | ICD-10-CM

## 2013-07-22 DIAGNOSIS — R58 Hemorrhage, not elsewhere classified: Secondary | ICD-10-CM

## 2013-07-22 DIAGNOSIS — S0180XA Unspecified open wound of other part of head, initial encounter: Secondary | ICD-10-CM

## 2013-07-22 DIAGNOSIS — M797 Fibromyalgia: Secondary | ICD-10-CM

## 2013-07-22 DIAGNOSIS — S01111D Laceration without foreign body of right eyelid and periocular area, subsequent encounter: Secondary | ICD-10-CM

## 2013-07-22 NOTE — Progress Notes (Signed)
   [address] [place] [address]   Phone [phone]  Subjective:    Patient ID: [name], female    DOB: [birth year], 56 y.o.   MRN: [id number]  HPI Pt presents for suture removal.  The wound is not causing her any discomfort.  She is having pain with her buttocks and leg bruise.  She used ice for the 1st 3 days.   Review of Systems     Objective:   Physical Exam  Vitals reviewed. Constitutional: She appears well-developed and well-nourished.  Pulmonary/Chest: Effort normal and breath sounds normal.  Skin: Skin is warm and dry.     Wound well healed.  Sutures removed without difficulty.  Psychiatric: She has a normal mood and affect. Her behavior is normal. Judgment and thought content normal.       Assessment & Plan:  Wound face - well healed.  Ecchymosis -

## 2013-09-17 DIAGNOSIS — F411 Generalized anxiety disorder: Secondary | ICD-10-CM | POA: Insufficient documentation

## 2013-12-09 DIAGNOSIS — F329 Major depressive disorder, single episode, unspecified: Secondary | ICD-10-CM | POA: Diagnosis present

## 2013-12-09 DIAGNOSIS — F32A Depression, unspecified: Secondary | ICD-10-CM | POA: Diagnosis present

## 2014-03-01 ENCOUNTER — Ambulatory Visit (INDEPENDENT_AMBULATORY_CARE_PROVIDER_SITE_OTHER): Payer: BC Managed Care – PPO | Admitting: Family Medicine

## 2014-03-01 VITALS — BP 134/82 | HR 114 | Temp 102.5°F | Resp 20 | Ht 65.5 in | Wt 248.0 lb

## 2014-03-01 DIAGNOSIS — R300 Dysuria: Secondary | ICD-10-CM

## 2014-03-01 DIAGNOSIS — N12 Tubulo-interstitial nephritis, not specified as acute or chronic: Secondary | ICD-10-CM

## 2014-03-01 DIAGNOSIS — R509 Fever, unspecified: Secondary | ICD-10-CM

## 2014-03-01 LAB — POCT URINALYSIS DIPSTICK
Bilirubin, UA: NEGATIVE
Glucose, UA: NEGATIVE
Ketones, UA: NEGATIVE
Nitrite, UA: NEGATIVE
Spec Grav, UA: 1.02
Urobilinogen, UA: 4
pH, UA: 5.5

## 2014-03-01 LAB — POCT CBC
GRANULOCYTE PERCENT: 81.8 % — AB (ref 37–80)
HCT, POC: 31.3 % — AB (ref 37.7–47.9)
HEMOGLOBIN: 9.9 g/dL — AB (ref 12.2–16.2)
LYMPH, POC: 1.6 (ref 0.6–3.4)
MCH, POC: 29.7 pg (ref 27–31.2)
MCHC: 31.6 g/dL — AB (ref 31.8–35.4)
MCV: 94 fL (ref 80–97)
MID (cbc): 1.2 — AB (ref 0–0.9)
MPV: 8.7 fL (ref 0–99.8)
POC GRANULOCYTE: 12.7 — AB (ref 2–6.9)
POC LYMPH %: 10.6 % (ref 10–50)
POC MID %: 7.6 % (ref 0–12)
Platelet Count, POC: 266 10*3/uL (ref 142–424)
RBC: 3.33 M/uL — AB (ref 4.04–5.48)
RDW, POC: 14 %
WBC: 15.5 10*3/uL — AB (ref 4.6–10.2)

## 2014-03-01 LAB — POCT UA - MICROSCOPIC ONLY
Casts, Ur, LPF, POC: NEGATIVE
Crystals, Ur, HPF, POC: NEGATIVE
Mucus, UA: NEGATIVE
Yeast, UA: NEGATIVE

## 2014-03-01 LAB — POCT WET PREP WITH KOH
KOH PREP POC: NEGATIVE
Trichomonas, UA: NEGATIVE
Yeast Wet Prep HPF POC: NEGATIVE

## 2014-03-01 MED ORDER — CEFTRIAXONE SODIUM 1 G IJ SOLR
1.0000 g | Freq: Once | INTRAMUSCULAR | Status: AC
  Administered 2014-03-01: 1 g via INTRAMUSCULAR

## 2014-03-01 MED ORDER — CIPROFLOXACIN HCL 500 MG PO TABS
500.0000 mg | ORAL_TABLET | Freq: Two times a day (BID) | ORAL | Status: DC
Start: 2014-03-01 — End: 2014-09-26

## 2014-03-01 NOTE — Progress Notes (Signed)
Subjective:   This chart was scribed for Sydney MochaEva N Alaina Donati, MD, by [name], ED Scribe. This patient was seen at 3:15 PM.   Patient ID: [name], female    DOB: Dec 24, 1955, 58 y.o.   MRN: [id number]  Chief Complaint  Patient presents with  . Cough    cough with light green x 3 days.  severe headache with the cough.  ?UTI  lower back pain, painful and buring with urination. 10 lb weight loss this week.     HPI  [name] is a 58 y.o. female who [facility] complaining of a fever which has persisted for four days. She states the first symptom she experienced four days ago was emesis and abdominal pain which was then followed by diarrhea and a fever four days ago. At the clinic her temperature is 102.5 F. She reports the fever and abdominal pain have continued; the emesis and diarrhea have resolved. She has also experienced a mild sore throat, a cough, and a headache which is increased with coughing.  Also, three days ago she developed urinary urgency, frequency, dysuria, cloudiness to her urine, and back pain. The pt reports she has experienced several episodes of urinary incontinence. She denies pelvic pain or vaginal discharge not associated with micturition. She reports she had intercourse with a new partner two weeks ago for the first time.    The pt has used hydrocodone for the pain, but she reports she uses hydrocodone "all the time" and, thus, minimal relief from the hydrocodone. She has also used OTC cough syrup.   She reports she is drinking "tons" of water, Gatorade, coke, and sprite. She has eaten minimally; she has not eaten anything today, but she ate a sausage biscuit yesterday. [name] states she has lost ten pounds in the course of a week.   The pt reports her daughter, who lives with her, has autism and experienced emesis and diarrhea after the pt developed the symptoms.   She denies a h/o swelling to her lower extremities increased from baseline.   She also  denies a h/o chronic UTI or a h/o renal calculi.   [name] voices a h/o chronic constipation, stating she has two BMs a week; she attributes the constipation due to a h/o pain medication for knee surgeries. She denies using a stool softener.   Past Medical History  Diagnosis Date  . Depression   . Arthritis   . Seasonal allergies   . Anemia   . Obese   . Sleep apnea 2012    took test in [place]-told to use cpap-could not ude   Current Outpatient Prescriptions on File Prior to Visit  Medication Sig Dispense Refill  . cetirizine-pseudoephedrine (ZYRTEC-D) 5-120 MG per tablet Take 1 tablet by mouth 2 (two) times daily.      . DULoxetine (CYMBALTA) 60 MG capsule Take 60 mg by mouth 2 (two) times daily.      Marland Kitchen. FLUoxetine (PROZAC) 20 MG capsule Take 20 mg by mouth 4 (four) times daily.      Marland Kitchen. ibuprofen (ADVIL,MOTRIN) 800 MG tablet Take 800 mg by mouth every 8 (eight) hours as needed.      . phentermine (ADIPEX-P) 37.5 MG tablet Take 37.5 mg by mouth daily before breakfast.       No current facility-administered medications on file prior to visit.   No Known Allergies   Review of Systems  Constitutional: Positive for fever, chills, diaphoresis, appetite change, fatigue  and unexpected weight change.  HENT: Positive for sore throat. Negative for ear pain.   Respiratory: Positive for cough.   Cardiovascular: Negative for leg swelling (Baseline swelling).  Gastrointestinal: Positive for nausea, vomiting, abdominal pain, diarrhea and constipation.  Genitourinary: Positive for dysuria, urgency, frequency and pelvic pain.  Musculoskeletal: Positive for back pain.  Neurological: Positive for headaches.   Vitals: BP 134/82  Pulse 114  Temp(Src) 102.5 F (39.2 C) (Oral)  Resp 20  Ht 5' 5.5" (1.664 m)  Wt 248 lb (112.492 kg)  BMI 40.63 kg/m2  SpO2 96%      Objective:   Physical Exam  Nursing note and vitals reviewed. Constitutional: She is oriented to person, place, and time.  She appears well-developed and well-nourished. No distress.  Diaphoretic. Ill-appearing. Clammy.   HENT:  Head: Normocephalic and atraumatic.  Right Ear: Tympanic membrane and external ear normal. No swelling. Tympanic membrane is not injected, not perforated, not erythematous, not retracted and not bulging. No middle ear effusion.  Left Ear: Tympanic membrane and external ear normal. No swelling. Tympanic membrane is not injected, not perforated, not erythematous, not retracted and not bulging.  No middle ear effusion.  Nose: Nose normal. No mucosal edema, rhinorrhea or sinus tenderness.  Mouth/Throat: Posterior oropharyngeal erythema present. No oropharyngeal exudate or posterior oropharyngeal edema.  Mucus membranes very dry.   Eyes: EOM are normal.  Neck: Neck supple. No tracheal deviation present.  Cardiovascular: Regular rhythm.   Murmur heard. 2/6 systolic murmur.  Tachycardic.   Pulmonary/Chest: Effort normal and breath sounds normal. No respiratory distress. She has no wheezes. She has no rales.  Abdominal: Soft. There is tenderness. There is no rebound and no guarding.  Hypoactive bowel sounds.  Bilateral upper quadrant tenderness.   Genitourinary:  No vaginal discharge. Normal vagina and cervix with some mild atrophy of mucosa. No tenderness on bimanual exam.   Musculoskeletal: Normal range of motion.  Left greater than right CVA tenderness.   Lymphadenopathy:       Head (right side): No submandibular and no tonsillar adenopathy present.       Head (left side): No submandibular and no tonsillar adenopathy present.    She has no cervical adenopathy.       Right: No supraclavicular adenopathy present.       Left: No supraclavicular adenopathy present.  Neurological: She is alert and oriented to person, place, and time.  Skin: Skin is warm. She is diaphoretic.  Psychiatric: She has a normal mood and affect. Her behavior is normal.   Results for orders placed in visit on  03/01/14  POCT URINALYSIS DIPSTICK      Result Value Ref Range   Color, UA yellow     Clarity, UA cloudy     Glucose, UA neg     Bilirubin, UA neg     Ketones, UA neg     Spec Grav, UA 1.020     Blood, UA moderate     pH, UA 5.5     Protein, UA 3+     Urobilinogen, UA 4.0     Nitrite, UA neg     Leukocytes, UA large (3+)    POCT UA - MICROSCOPIC ONLY      Result Value Ref Range   WBC, Ur, HPF, POC TNTC     RBC, urine, microscopic 6-12     Bacteria, U Microscopic 2+     Mucus, UA neg     Epithelial cells, urine per micros 2-4  Crystals, Ur, HPF, POC neg     Casts, Ur, LPF, POC neg     Yeast, UA neg    POCT CBC      Result Value Ref Range   WBC 15.5 (*) 4.6 - 10.2 K/uL   Lymph, poc 1.6  0.6 - 3.4   POC LYMPH PERCENT 10.6  10 - 50 %L   MID (cbc) 1.2 (*) 0 - 0.9   POC MID % 7.6  0 - 12 %M   POC Granulocyte 12.7 (*) 2 - 6.9   Granulocyte percent 81.8 (*) 37 - 80 %G   RBC 3.33 (*) 4.04 - 5.48 M/uL   Hemoglobin 9.9 (*) 12.2 - 16.2 g/dL   HCT, POC 96.0 (*) 45.4 - 47.9 %   MCV 94.0  80 - 97 fL   MCH, POC 29.7  27 - 31.2 pg   MCHC 31.6 (*) 31.8 - 35.4 g/dL   RDW, POC 09.8     Platelet Count, POC 266  142 - 424 K/uL   MPV 8.7  0 - 99.8 fL  POCT WET PREP WITH KOH      Result Value Ref Range   Trichomonas, UA Negative     Clue Cells Wet Prep HPF POC 0-1     Epithelial Wet Prep HPF POC 1-3     Yeast Wet Prep HPF POC neg     Bacteria Wet Prep HPF POC 1+     RBC Wet Prep HPF POC 0-1     WBC Wet Prep HPF POC 2-3     KOH Prep POC Negative          Assessment & Plan:  Will treat pt with IV antibiotic and fluids.  Will perform pelvic exam.   Dysuria - Plan: POCT urinalysis dipstick, POCT UA - Microscopic Only, Urine culture, POCT CBC, Comprehensive metabolic panel, POCT Wet Prep with KOH, GC/Chlamydia Probe Amp  Pyelonephritis - Plan: Urine culture, POCT CBC, Comprehensive metabolic panel, cefTRIAXone (ROCEPHIN) injection 1 g  Fever - Plan: Urine culture, POCT CBC,  Comprehensive metabolic panel  Meds ordered this encounter  Medications  . cefTRIAXone (ROCEPHIN) injection 1 g    Sig:     Order Specific Question:  Antibiotic Indication:    Answer:  Other Indication (list below)    Order Specific Question:  Other Indication:    Answer:  Pyelonephritis  . ciprofloxacin (CIPRO) 500 MG tablet    Sig: Take 1 tablet (500 mg total) by mouth 2 (two) times daily.    Dispense:  14 tablet    Refill:  0    I personally performed the services described in this documentation, which was scribed in my presence. The recorded information has been reviewed and considered, and addended by me as needed.  Norberto Sorenson, MD MPH

## 2014-03-01 NOTE — Patient Instructions (Signed)

## 2014-03-02 ENCOUNTER — Telehealth: Payer: Self-pay

## 2014-03-02 ENCOUNTER — Ambulatory Visit (INDEPENDENT_AMBULATORY_CARE_PROVIDER_SITE_OTHER): Payer: BC Managed Care – PPO | Admitting: Family Medicine

## 2014-03-02 VITALS — BP 128/66 | HR 98 | Temp 99.4°F | Resp 18 | Ht 65.0 in | Wt 252.0 lb

## 2014-03-02 DIAGNOSIS — R300 Dysuria: Secondary | ICD-10-CM

## 2014-03-02 DIAGNOSIS — R109 Unspecified abdominal pain: Secondary | ICD-10-CM

## 2014-03-02 DIAGNOSIS — N12 Tubulo-interstitial nephritis, not specified as acute or chronic: Secondary | ICD-10-CM

## 2014-03-02 LAB — POCT URINALYSIS DIPSTICK
BILIRUBIN UA: NEGATIVE
Glucose, UA: NEGATIVE
KETONES UA: NEGATIVE
Nitrite, UA: NEGATIVE
PH UA: 7
Protein, UA: 30
Spec Grav, UA: 1.01
Urobilinogen, UA: 1

## 2014-03-02 LAB — POCT CBC
GRANULOCYTE PERCENT: 75.3 % (ref 37–80)
HEMATOCRIT: 32.8 % — AB (ref 37.7–47.9)
HEMOGLOBIN: 10.2 g/dL — AB (ref 12.2–16.2)
Lymph, poc: 2.1 (ref 0.6–3.4)
MCH, POC: 29.7 pg (ref 27–31.2)
MCHC: 31.1 g/dL — AB (ref 31.8–35.4)
MCV: 95.4 fL (ref 80–97)
MID (cbc): 1.5 — AB (ref 0–0.9)
MPV: 8.3 fL (ref 0–99.8)
POC GRANULOCYTE: 10.8 — AB (ref 2–6.9)
POC LYMPH %: 14.5 % (ref 10–50)
POC MID %: 10.2 %M (ref 0–12)
Platelet Count, POC: 284 10*3/uL (ref 142–424)
RBC: 3.44 M/uL — AB (ref 4.04–5.48)
RDW, POC: 14.2 %
WBC: 14.3 10*3/uL — AB (ref 4.6–10.2)

## 2014-03-02 LAB — COMPREHENSIVE METABOLIC PANEL
ALK PHOS: 141 U/L — AB (ref 39–117)
ALT: 26 U/L (ref 0–35)
AST: 34 U/L (ref 0–37)
Albumin: 3.5 g/dL (ref 3.5–5.2)
BILIRUBIN TOTAL: 0.3 mg/dL (ref 0.2–1.2)
BUN: 17 mg/dL (ref 6–23)
CO2: 22 mEq/L (ref 19–32)
Calcium: 8.3 mg/dL — ABNORMAL LOW (ref 8.4–10.5)
Chloride: 98 mEq/L (ref 96–112)
Creat: 1.28 mg/dL — ABNORMAL HIGH (ref 0.50–1.10)
Glucose, Bld: 119 mg/dL — ABNORMAL HIGH (ref 70–99)
POTASSIUM: 3.4 meq/L — AB (ref 3.5–5.3)
SODIUM: 131 meq/L — AB (ref 135–145)
TOTAL PROTEIN: 5.5 g/dL — AB (ref 6.0–8.3)

## 2014-03-02 LAB — IFOBT (OCCULT BLOOD): IMMUNOLOGICAL FECAL OCCULT BLOOD TEST: NEGATIVE

## 2014-03-02 LAB — POCT UA - MICROSCOPIC ONLY
Casts, Ur, LPF, POC: NEGATIVE
Crystals, Ur, HPF, POC: NEGATIVE
EPITHELIAL CELLS, URINE PER MICROSCOPY: NEGATIVE
Mucus, UA: NEGATIVE
WBC, UR, HPF, POC: NEGATIVE
Yeast, UA: NEGATIVE

## 2014-03-02 MED ORDER — CEFTRIAXONE SODIUM 1 G IJ SOLR
1.0000 g | Freq: Once | INTRAMUSCULAR | Status: AC
  Administered 2014-03-02: 1 g via INTRAMUSCULAR

## 2014-03-02 MED ORDER — FLUCONAZOLE 150 MG PO TABS: 150.0000 mg | ORAL_TABLET | Freq: Once | ORAL | Status: DC

## 2014-03-02 NOTE — Patient Instructions (Signed)
The other possibility as a cause of your abdominal pain is diverticulitis.  If you abdominal pain is not improving or you continue to have abnormal stools and fevers, we may need to add on an additional antibiotic or even consider a CT scan of your abdomen.  Recheck with mi 5/11 after 5 p.m. If not improving. If you are starting to get a little better, make sure you come in 5/12 before you leave town  To make sure you are ok to travel and don't need additional antibiotics.  Pyelonephritis, Adult Pyelonephritis is a kidney infection. In general, there are 2 main types of pyelonephritis:  Infections that come on quickly without any warning (acute pyelonephritis).  Infections that persist for a long period of time (chronic pyelonephritis). CAUSES  Two main causes of pyelonephritis are:  Bacteria traveling from the bladder to the kidney. This is a problem especially in pregnant women. The urine in the bladder can become filled with bacteria from multiple causes, including:  Inflammation of the prostate gland (prostatitis).  Sexual intercourse in females.  Bladder infection (cystitis).  Bacteria traveling from the bloodstream to the tissue part of the kidney. Problems that may increase your risk of getting a kidney infection include:  Diabetes.  Kidney stones or bladder stones.  Cancer.  Catheters placed in the bladder.  Other abnormalities of the kidney or ureter. SYMPTOMS   Abdominal pain.  Pain in the side or flank area.  Fever.  Chills.  Upset stomach.  Blood in the urine (dark urine).  Frequent urination.  Strong or persistent urge to urinate.  Burning or stinging when urinating. DIAGNOSIS  Your caregiver may diagnose your kidney infection based on your symptoms. A urine sample may also be taken. TREATMENT  In general, treatment depends on how severe the infection is.   If the infection is mild and caught early, your caregiver may treat you with oral antibiotics  and send you home.  If the infection is more severe, the bacteria may have gotten into the bloodstream. This will require intravenous (IV) antibiotics and a hospital stay. Symptoms may include:  High fever.  Severe flank pain.  Shaking chills.  Even after a hospital stay, your caregiver may require you to be on oral antibiotics for a period of time.  Other treatments may be required depending upon the cause of the infection. HOME CARE INSTRUCTIONS   Take your antibiotics as directed. Finish them even if you start to feel better.  Make an appointment to have your urine checked to make sure the infection is gone.  Drink enough fluids to keep your urine clear or pale yellow.  Take medicines for the bladder if you have urgency and frequency of urination as directed by your caregiver. SEEK IMMEDIATE MEDICAL CARE IF:   You have a fever or persistent symptoms for more than 2-3 days.  You have a fever and your symptoms suddenly get worse.  You are unable to take your antibiotics or fluids.  You develop shaking chills.  You experience extreme weakness or fainting.  There is no improvement after 2 days of treatment. MAKE SURE YOU:  Understand these instructions.  Will watch your condition.  Will get help right away if you are not doing well or get worse. Document Released: 10/10/2005 Document Revised: 04/10/2012 Document Reviewed: 03/16/2011 [facility] Patient Information 2014 [place], [place].

## 2014-03-02 NOTE — Telephone Encounter (Signed)
Patient called stated she was unable to pick up 2 pills from [facility] last evening due to they closed before she arrived. Patient is to return today to see Dr Clelia CroftShaw however needs to know if she still needs to go and get med from [facility] this morning? Please call back at [phone]

## 2014-03-02 NOTE — Telephone Encounter (Signed)
Per Dr. Clelia CroftShaw pt is to start med tonight because she is going to get more antibiotics when she come in.  Pt can pickup either before or after she comes since she has to come in for more antibiotics.  Pt notified

## 2014-03-03 LAB — GC/CHLAMYDIA PROBE AMP
CT PROBE, AMP APTIMA: NEGATIVE
GC Probe RNA: NEGATIVE

## 2014-03-04 ENCOUNTER — Telehealth: Payer: Self-pay

## 2014-03-04 LAB — URINE CULTURE: Colony Count: >=100000

## 2014-03-04 NOTE — Telephone Encounter (Signed)
Other than being under a ton of stress due to mother. Pt states mother does not have much longer to live. She is headed out to see her.  She will call this weekend to follow up again. She will come in if necessary. She has not had a fever since Sunday. She was feeling weak Monday morning, she took the day off to rest up. She is feeling much better.  She has been getting her appetite back. She has been staying hydrated.

## 2014-03-04 NOTE — Telephone Encounter (Signed)
PT STATED DR SHAW WANTED HER TO COME BACK IN FOR A RECHECK, HOWEVER SHE IS LEAVING TO GO SEE HER MOM WHO IS REALLY SICK AND DR Clelia CroftSHAW KNOWS ABOUT IT PLEASE CALL [phone]

## 2014-03-05 NOTE — Telephone Encounter (Signed)
Advised patient that her urine culture came back and she will be successfully treated by the cipro.  Advised her that Dr. Clelia CroftShaw would like to recheck her about the 18th.  She said she will come back this weekend, probably Sunday [place] patient's mother is very ill.  She would like Dr. Clelia CroftShaw to know this.

## 2014-03-05 NOTE — Telephone Encounter (Signed)
Luckily we got her urine culture results back and her pyelonephritis (kidney infection) will be successfully treated by the cipro.  Rec OV for recheck prior to finishing antibiotic course (so about 5/18) - sooner if worsening.

## 2014-03-07 ENCOUNTER — Telehealth: Payer: Self-pay

## 2014-03-07 DIAGNOSIS — K920 Hematemesis: Secondary | ICD-10-CM | POA: Insufficient documentation

## 2014-03-07 NOTE — Telephone Encounter (Signed)
Called , patient indicates she has vomited bright red blood x4 now has vomited multiple times and this is black, she feels weak. I have advised her to go NOW to the emergency room, she can not wait to be seen until she gets home. She should not drive, she indicates her husband will take her now. To you FYI

## 2014-03-07 NOTE — Telephone Encounter (Signed)
PATIENT WOULD LIKE TO TALK TO DR. SHAW AS SOON AS POSSIBLE. SHE HAS SEEN DR. SHAW WITHIN THE LAST FEW DAYS FOR SOMETHING TOTALLY DIFFERENT. HOWEVER, DR. SHAW SAID IT WOULD BE O.K. FOR HER TO GO SEE HER MOTHER IN [place] WHO  IS VERY SICK. THIS MORNING [name] HAS THROWN UP 4 TIMES AND IT WAS NOTHING BUT BLOOD. SHE JUST NEEDS TO GET DR. SHAW'S ADVICE ON IF SHE SHOULD RETURN BACK HOME? BEST PHONE [phone] (CELL)    MBC

## 2014-04-03 ENCOUNTER — Telehealth: Payer: Self-pay

## 2014-04-03 NOTE — Telephone Encounter (Signed)
Pt sched for 6/19 - 1 wk.

## 2014-04-03 NOTE — Telephone Encounter (Signed)
PATIENT SAYS SHE WANTS TO LET DR Clelia Croft KNOW HOW SHE IS DOING.

## 2014-04-03 NOTE — Telephone Encounter (Signed)
Okay, so I called, her how is she doing?  Called patient she did go to [employer] to see her mother and she Gentil) was in hospital for 6 days due to a GI bleed. She indicates she is going to go back there soon, she has scheduled an appt with you for 6/15. She wants you to be her PCP

## 2014-04-03 NOTE — Telephone Encounter (Signed)
Thanks for the update. I would be happy to be her PCP. Do not have sched appt on 6/15 though but will be in clinic that evening.

## 2014-04-11 ENCOUNTER — Ambulatory Visit: Payer: BC Managed Care – PPO | Admitting: Family Medicine

## 2014-05-03 DIAGNOSIS — T148XXA Other injury of unspecified body region, initial encounter: Secondary | ICD-10-CM | POA: Insufficient documentation

## 2014-05-16 NOTE — Progress Notes (Signed)
Subjective:    Patient ID: [name], female    DOB: 12-May-1956, 58 y.o.   MRN: [id number] This chart was scribed for Levell July. Clelia Croft, MD by Marica Otter, ED Scribe. This patient was seen in room 3 and the patient's care was started at 12:54 PM.   Chief Complaint  Patient presents with  . Follow-up    HPI  HPI Comments: LELIANA Berry is a 58 y.o. female who presents to the [facility] for a recheck. Pt reports she continues to feel poorly, with very little improvement since yesterday, and had a fever of 102 degrees last night. Pt denies a temperature this morning. Pt reports she still has not picked up her Cipro. Pt complains of intermittent back pain; cough; sore throat; and mild, upper abd pain. Pt denies nausea but reports she is unable to eat. Pt reports she had 4 loose, bowel movements after eating a small bowl of mashed potatoes last night.   Summary of Last Visit: Pt was seen in clinic yesterday and diagnsed with Pyelonephritis and had a white count of 15. Given 2L of nl saline and cefTRIAXone (ROCEPHIN) injection 1 g. Instructed to start Cipro but unable to obtain Cipro from pharmacy last night, thus, has not started med yet.   Social Hx: Pt reports her mother is extremely ill and "on her death bed."   Current Outpatient Prescriptions on File Prior to Visit  Medication Sig Dispense Refill  . cetirizine-pseudoephedrine (ZYRTEC-D) 5-120 MG per tablet Take 1 tablet by mouth 2 (two) times daily.      . ciprofloxacin (CIPRO) 500 MG tablet Take 1 tablet (500 mg total) by mouth 2 (two) times daily.  14 tablet  0  . DULoxetine (CYMBALTA) 60 MG capsule Take 60 mg by mouth 2 (two) times daily.      Marland Kitchen FLUoxetine (PROZAC) 20 MG capsule Take 20 mg by mouth 4 (four) times daily.      Marland Kitchen ibuprofen (ADVIL,MOTRIN) 800 MG tablet Take 800 mg by mouth every 8 (eight) hours as needed.      . phentermine (ADIPEX-P) 37.5 MG tablet Take 37.5 mg by mouth daily before breakfast.        No current facility-administered medications on file prior to visit.     Review of Systems  Constitutional: Positive for fever, chills and appetite change (loss of appetite).  HENT: Positive for sore throat.   Respiratory: Positive for cough.       BP 128/66  Pulse 98  Temp(Src) 99.4 F (37.4 C)  Resp 18  Ht 5\' 5"  (1.651 m)  Wt 252 lb (114.306 kg)  BMI 41.93 kg/m2  SpO2 97% Objective:   Physical Exam  Nursing note and vitals reviewed. Constitutional: She is oriented to person, place, and time. She appears well-developed and well-nourished. No distress.  HENT:  Head: Normocephalic and atraumatic.  Eyes: EOM are normal.  Neck: Neck supple. No tracheal deviation present.  Cardiovascular: Normal rate and regular rhythm.   Murmur (2/6 systolic murmur) heard. Pulmonary/Chest: Effort normal and breath sounds normal. No respiratory distress.  Abdominal: Soft. Bowel sounds are normal. There is tenderness (generalized ). There is guarding.  Genitourinary:  Rectal Exam: Non inflamed external hemorrhoids, no blood visible, normal anal tone, no stool in vault.   Musculoskeletal: Normal range of motion.  Neurological: She is alert and oriented to person, place, and time.  Skin: Skin is warm and dry.  Psychiatric: She has a normal mood  and affect. Her behavior is normal.      Results for orders placed in visit on 03/02/14  POCT CBC      Result Value Ref Range   WBC 14.3 (*) 4.6 - 10.2 K/uL   Lymph, poc 2.1  0.6 - 3.4   POC LYMPH PERCENT 14.5  10 - 50 %L   MID (cbc) 1.5 (*) 0 - 0.9   POC MID % 10.2  0 - 12 %M   POC Granulocyte 10.8 (*) 2 - 6.9   Granulocyte percent 75.3  37 - 80 %G   RBC 3.44 (*) 4.04 - 5.48 M/uL   Hemoglobin 10.2 (*) 12.2 - 16.2 g/dL   HCT, POC 29.532.8 (*) 62.137.7 - 47.9 %   MCV 95.4  80 - 97 fL   MCH, POC 29.7  27 - 31.2 pg   MCHC 31.1 (*) 31.8 - 35.4 g/dL   RDW, POC 30.814.2     Platelet Count, POC 284  142 - 424 K/uL   MPV 8.3  0 - 99.8 fL  POCT UA -  MICROSCOPIC ONLY      Result Value Ref Range   WBC, Ur, HPF, POC neg     RBC, urine, microscopic 1-3     Bacteria, U Microscopic trace     Mucus, UA neg     Epithelial cells, urine per micros neg     Crystals, Ur, HPF, POC neg     Casts, Ur, LPF, POC neg     Yeast, UA neg    POCT URINALYSIS DIPSTICK      Result Value Ref Range   Color, UA light yellow     Clarity, UA clear     Glucose, UA neg     Bilirubin, UA neg     Ketones, UA neg     Spec Grav, UA 1.010     Blood, UA trace-lysed     pH, UA 7.0     Protein, UA 30     Urobilinogen, UA 1.0     Nitrite, UA neg     Leukocytes, UA small (1+)    IFOBT (OCCULT BLOOD)      Result Value Ref Range   IFOBT Negative         Assessment & Plan:  1:10 PM-Discussed treatment plan with pt at bedside and pt agreed to plan. Pt advised to return if symptoms worsen or do not improve.  Dysuria - Plan: POCT CBC, POCT UA - Microscopic Only, POCT urinalysis dipstick  Pyelonephritis - Plan: POCT CBC, POCT UA - Microscopic Only, POCT urinalysis dipstick, cefTRIAXone (ROCEPHIN) injection 1 g  Abdominal pain, unspecified site - Plan: IFOBT POC (occult bld, rslt in office)  Meds ordered this encounter  Medications  . cefTRIAXone (ROCEPHIN) injection 1 g    Sig:     Order Specific Question:  Antibiotic Indication:    Answer:  UTI  . fluconazole (DIFLUCAN) 150 MG tablet    Sig: Take 1 tablet (150 mg total) by mouth once. Repeat if needed after 3 days    Dispense:  2 tablet    Refill:  0    I personally performed the services described in this documentation, which was scribed in my presence. The recorded information has been reviewed and considered, and addended by me as needed.  [name], MD MPH

## 2014-06-27 ENCOUNTER — Other Ambulatory Visit (HOSPITAL_COMMUNITY): Payer: Self-pay | Admitting: Rheumatology

## 2014-06-27 ENCOUNTER — Ambulatory Visit (HOSPITAL_COMMUNITY): Payer: BC Managed Care – PPO | Attending: Family Medicine | Admitting: Cardiology

## 2014-06-27 DIAGNOSIS — Z6841 Body Mass Index (BMI) 40.0 and over, adult: Secondary | ICD-10-CM | POA: Diagnosis not present

## 2014-06-27 DIAGNOSIS — I10 Essential (primary) hypertension: Secondary | ICD-10-CM | POA: Insufficient documentation

## 2014-06-27 DIAGNOSIS — R609 Edema, unspecified: Secondary | ICD-10-CM | POA: Insufficient documentation

## 2014-06-27 DIAGNOSIS — E669 Obesity, unspecified: Secondary | ICD-10-CM | POA: Insufficient documentation

## 2014-06-27 DIAGNOSIS — I079 Rheumatic tricuspid valve disease, unspecified: Secondary | ICD-10-CM | POA: Diagnosis not present

## 2014-06-27 DIAGNOSIS — E785 Hyperlipidemia, unspecified: Secondary | ICD-10-CM | POA: Insufficient documentation

## 2014-06-27 NOTE — Progress Notes (Signed)
Echo performed. 

## 2014-08-28 ENCOUNTER — Ambulatory Visit (INDEPENDENT_AMBULATORY_CARE_PROVIDER_SITE_OTHER): Payer: Self-pay

## 2014-08-28 ENCOUNTER — Ambulatory Visit (INDEPENDENT_AMBULATORY_CARE_PROVIDER_SITE_OTHER): Payer: BC Managed Care – PPO | Admitting: Neurology

## 2014-08-28 DIAGNOSIS — G5601 Carpal tunnel syndrome, right upper limb: Secondary | ICD-10-CM

## 2014-08-28 DIAGNOSIS — Z0289 Encounter for other administrative examinations: Secondary | ICD-10-CM

## 2014-08-28 NOTE — Procedures (Signed)
   NCS (NERVE CONDUCTION STUDY) WITH EMG (ELECTROMYOGRAPHY) REPORT   STUDY DATE: November fifth 2015 PATIENT NAME: [name] DOB: 1955/11/04 MRN: [id number]    TECHNOLOGIST: Gearldine ShownLorraine Jones ELECTROMYOGRAPHER: Levert FeinsteinYan, Venna Berberich M.D.  CLINICAL INFORMATION:   58 years old right-handed female, presenting with 3 month history of right hand paresthesia,mainly involving right first 3 fingers, weakness of right hand  On examination: She has mild to moderate right abductor pollicis brevis, opponents weakness, length dependent decreased to pinprick at first 3 finger pads.  FINDINGS: NERVE CONDUCTION STUDY: Right ulnar sensory and motor responses were normal. Right median sensory response showed mild to moderately prolonged peak latency, with normal snap amplitude. Right median motor response showed mild to moderately prolonged distal latency, with preserved C map amplitude, conduction velocity.  NEEDLE ELECTROMYOGRAPHY: Selected needle examination was performed at right upper extremities, right cervical paraspinal muscles  Right abductor pollicis brevis: Increased insertion activity, no spontaneous activity, enlarged complex motor unit potential, with mildly decreased recruitment patterns.  Needle examination of right pronator teres, biceps, triceps, deltoid, extensor digital communis was normal  There was no spontaneous activities at right cervical paraspinal muscles,right C5, C6, C7  IMPRESSION: This is an abnormal study. There is electrodiagnostic evidence of median neuropathy across the right wrist, consistent with moderate right carpal tunnel syndromes. There was no evidence of right cervical radiculopathy.   INTERPRETING PHYSICIAN:   Levert FeinsteinYan, Greenley Martone M.D. Ph.D. [facility] [address], [address] [place], [place] [address] [phone]

## 2014-09-23 ENCOUNTER — Telehealth: Payer: Self-pay

## 2014-09-23 ENCOUNTER — Ambulatory Visit (INDEPENDENT_AMBULATORY_CARE_PROVIDER_SITE_OTHER): Payer: Self-pay | Admitting: Family Medicine

## 2014-09-23 VITALS — BP 138/88 | HR 104 | Temp 97.8°F | Resp 18 | Ht 65.0 in | Wt 269.0 lb

## 2014-09-23 DIAGNOSIS — Z23 Encounter for immunization: Secondary | ICD-10-CM

## 2014-09-23 DIAGNOSIS — R040 Epistaxis: Secondary | ICD-10-CM

## 2014-09-23 DIAGNOSIS — E669 Obesity, unspecified: Secondary | ICD-10-CM

## 2014-09-23 MED ORDER — OXYMETAZOLINE HCL 0.05 % NA SOLN: 3.0000 | Freq: Two times a day (BID) | NASAL | Status: DC | PRN

## 2014-09-23 MED ORDER — PHENTERMINE HCL 37.5 MG PO TABS: 37.5000 mg | ORAL_TABLET | Freq: Every day | ORAL | Status: DC

## 2014-09-23 NOTE — Progress Notes (Signed)
Subjective:  This chart was scribed for Norberto SorensonEva Nikaela Coyne, MD by Carl Bestelina Holson, Medical Scribe. This patient was seen in Room 8 and the patient's care was started at 10:46 AM.   Patient ID: [name], female    DOB: Mar 09, 1956, 58 y.o.   MRN: [id number]  Chief Complaint  Patient presents with  . Epistaxis    3 times in the last 2 days    HPI HPI Comments: [name] is a [age] y.o. female who presents to the [facility] complaining of 3 severe, episodes of epistaxis within the past 2 days.  She states that the episodes have lasted for more than an hour and are only localized to the left nare.  She has not used any nasal sprays but she takes Zyrtec D regularly.  She does not use a humidifier.  She experienced these symptoms before and had a cauterization procedure done.  She denies nasal congestion and sinus pressure or pain as associated symptoms.    Past Medical History  Diagnosis Date  . Depression   . Arthritis   . Seasonal allergies   . Anemia   . Obese   . Sleep apnea 2012    took test in [place]-told to use cpap-could not ude   Past Surgical History  Procedure Laterality Date  . Total knee revision  07/30/2010    right; with lateral release  . Anterior cervical decomp/discectomy fusion  05/09/2008    C5-6, C6-7; with arthrodesis also  . Rotator cuff repair w/ distal clavicle excision  01/06/2006    right  . 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 Total knee arthroplasty  11/07/2003    right  . Shoulder arthroscopy  2003    rt  . Knee arthroscopy  2004    rt and lt knee  . Shoulder arthroscopy  2003    rt  . Tonsillectomy    . Gastric bypass  2002   Family History  Problem Relation Age of Onset  . Heart disease Mother   . Cancer Father    History   Social History  . Marital Status: Married    Spouse Name: N/A    Number of Children: N/A  . Years of Education: N/A   Occupational History  . Not on file.   Social History Main Topics    . Smoking status: Former Smoker    Quit date: 05/16/1981  . Smokeless tobacco: Not on file  . Alcohol Use: 3.0 oz/week    5 Cans of beer per week  . Drug Use: No  . Sexual Activity: Yes   Other Topics Concern  . Not on file   Social History Narrative   No Known Allergies  Review of Systems  HENT: Negative for congestion and sinus pressure.     Objective:  BP 138/88 mmHg  Pulse 104  Temp(Src) 97.8 F (36.6 C) (Oral)  Resp 18  Ht 5\' 5"  (1.651 m)  Wt 269 lb (122.018 kg)  BMI 44.76 kg/m2  SpO2 95% Physical Exam  Constitutional: She is oriented to person, place, and time. She appears well-developed and well-nourished.  HENT:  Head: Normocephalic and atraumatic.  Right Ear: Hearing, tympanic membrane, external ear and ear canal normal.  Left Ear: Hearing, tympanic membrane, external ear and ear canal normal.  Nose: Mucosal edema present.  Mouth/Throat: Oropharynx is clear and moist. No oropharyngeal exudate.  Mild friable area along the left nasal septum.  Eyes: EOM are normal.  Neck: Normal range of motion.  Cardiovascular: Normal rate.   Pulmonary/Chest: Effort normal.  Musculoskeletal: Normal range of motion.  Neurological: She is alert and oriented to person, place, and time.  Skin: Skin is warm and dry.  Psychiatric: She has a normal mood and affect. Her behavior is normal.  Nursing note and vitals reviewed.   Assessment & Plan:   Epistaxis, recurrent - cauterized friable left nasal apex with silver nitrate. Pt tolerated procedure well.  Start intranasal petroleum ointment and humidifier.   Advised patient to use a humidifier and Afrin nasal spray if she encounters another episode of bleeding.   Obesity - pt reports she did really well on adipex prev - reviewed risks and concern for recurrence of weight gain once med is stopped but pt really wants to try again to help her kick off her weightloss journey - ok to do 4 wk trial - needs to have recheck in office  with 5 lb weight loss to continue.  Influenza vaccine administered - Plan: Flu Vaccine QUAD 36+ mos IM  Meds ordered this encounter  Medications  . oxymetazoline (AFRIN NASAL SPRAY) 0.05 % nasal spray    Sig: Place 3 sprays into right nostril 2 (two) times daily as needed (nose bleeds).    Dispense:  30 mL    Refill:  0  . phentermine (ADIPEX-P) 37.5 MG tablet    Sig: Take 1 tablet (37.5 mg total) by mouth daily before breakfast.    Dispense:  30 tablet    Refill:  0    I personally performed the services described in this documentation, which was scribed in my presence. The recorded information has been reviewed and considered, and addended by me as needed.  Norberto SorensonEva Dazia Lippold, MD MPH

## 2014-09-23 NOTE — Telephone Encounter (Signed)
Patient was seen this morning has more questions regarding her treatment.   [phone]

## 2014-09-23 NOTE — Patient Instructions (Signed)
Start a  Cool mist humidifier next to your bed.  Apply vaseline or antibiotic ointment into right nare along the septum every few hours throughout the day. Recheck weight in 1 mo for any additional med refills - you need to loose at least 5 pounds minimum in the next month if you want to continue.  Nosebleed Nosebleeds can be caused by many conditions, including trauma, infections, polyps, foreign bodies, dry mucous membranes or climate, medicines, and air conditioning. Most nosebleeds occur in the front of the nose. Because of this location, most nosebleeds can be controlled by pinching the nostrils gently and continuously for at least 10 to 20 minutes. The long, continuous pressure allows enough time for the blood to clot. If pressure is released during that 10 to 20 minute time period, the process may have to be started again. The nosebleed may stop by itself or quit with pressure, or it may need concentrated heating (cautery) or pressure from packing. HOME CARE INSTRUCTIONS   If your nose was packed, try to maintain the pack inside until your health care provider removes it. If a gauze pack was used and it starts to fall out, gently replace it or cut the end off. Do not cut if a balloon catheter was used to pack the nose. Otherwise, do not remove unless instructed.  Avoid blowing your nose for 12 hours after treatment. This could dislodge the pack or clot and start the bleeding again.  If the bleeding starts again, sit up and bend forward, gently pinching the front half of your nose continuously for 20 minutes.  If bleeding was caused by dry mucous membranes, use over-the-counter saline nasal spray or gel. This will keep the mucous membranes moist and allow them to heal. If you must use a lubricant, choose the water-soluble variety. Use it only sparingly and not within several hours of lying down.  Do not use petroleum jelly or mineral oil, as these may drip into the lungs and cause serious  problems.  Maintain humidity in your home by using less air conditioning or by using a humidifier.  Do not use aspirin or medicines which make bleeding more likely. Your health care provider can give you recommendations on this.  Resume normal activities as you are able, but try to avoid straining, lifting, or bending at the waist for several days.  If the nosebleeds become recurrent and the cause is unknown, your health care provider may suggest laboratory tests. SEEK MEDICAL CARE IF: You have a fever. SEEK IMMEDIATE MEDICAL CARE IF:   Bleeding recurs and cannot be controlled.  There is unusual bleeding from or bruising on other parts of the body.  Nosebleeds continue.  There is any worsening of the condition which originally brought you in.  You become light-headed, feel faint, become sweaty, or vomit blood. MAKE SURE YOU:   Understand these instructions.  Will watch your condition.  Will get help right away if you are not doing well or get worse. Document Released: 07/20/2005 Document Revised: 02/24/2014 Document Reviewed: 09/10/2009 [facility] Patient Information 2015 [place], [place]. This information is not intended to replace advice given to you by your health care provider. Make sure you discuss any questions you have with your health care provider.

## 2014-09-24 ENCOUNTER — Encounter (HOSPITAL_COMMUNITY): Payer: Self-pay | Admitting: Neurology

## 2014-09-24 ENCOUNTER — Ambulatory Visit (INDEPENDENT_AMBULATORY_CARE_PROVIDER_SITE_OTHER): Payer: Self-pay | Admitting: Family Medicine

## 2014-09-24 ENCOUNTER — Observation Stay (HOSPITAL_COMMUNITY)
Admission: EM | Admit: 2014-09-24 | Discharge: 2014-09-26 | Disposition: A | Discharge status: Home / Self Care | Payer: BC Managed Care – PPO | Attending: Family Medicine | Admitting: Family Medicine

## 2014-09-24 VITALS — BP 142/90 | HR 106 | Temp 98.8°F | Resp 18 | Ht 66.0 in | Wt 259.0 lb

## 2014-09-24 DIAGNOSIS — Z87891 Personal history of nicotine dependence: Secondary | ICD-10-CM | POA: Insufficient documentation

## 2014-09-24 DIAGNOSIS — D500 Iron deficiency anemia secondary to blood loss (chronic): Secondary | ICD-10-CM

## 2014-09-24 DIAGNOSIS — Z6841 Body Mass Index (BMI) 40.0 and over, adult: Secondary | ICD-10-CM | POA: Diagnosis not present

## 2014-09-24 DIAGNOSIS — G8929 Other chronic pain: Secondary | ICD-10-CM | POA: Diagnosis not present

## 2014-09-24 DIAGNOSIS — F329 Major depressive disorder, single episode, unspecified: Secondary | ICD-10-CM | POA: Diagnosis not present

## 2014-09-24 DIAGNOSIS — G4730 Sleep apnea, unspecified: Secondary | ICD-10-CM | POA: Diagnosis not present

## 2014-09-24 DIAGNOSIS — R040 Epistaxis: Principal | ICD-10-CM | POA: Insufficient documentation

## 2014-09-24 DIAGNOSIS — F1010 Alcohol abuse, uncomplicated: Secondary | ICD-10-CM | POA: Insufficient documentation

## 2014-09-24 DIAGNOSIS — M797 Fibromyalgia: Secondary | ICD-10-CM | POA: Insufficient documentation

## 2014-09-24 DIAGNOSIS — D62 Acute posthemorrhagic anemia: Secondary | ICD-10-CM | POA: Diagnosis not present

## 2014-09-24 DIAGNOSIS — E669 Obesity, unspecified: Secondary | ICD-10-CM | POA: Insufficient documentation

## 2014-09-24 LAB — CBC
HEMATOCRIT: 24.6 % — AB (ref 36.0–46.0)
Hemoglobin: 7.6 g/dL — ABNORMAL LOW (ref 12.0–15.0)
MCH: 26.6 pg (ref 26.0–34.0)
MCHC: 30.9 g/dL (ref 30.0–36.0)
MCV: 86 fL (ref 78.0–100.0)
Platelets: 416 10*3/uL — ABNORMAL HIGH (ref 150–400)
RBC: 2.86 MIL/uL — AB (ref 3.87–5.11)
RDW: 17.5 % — ABNORMAL HIGH (ref 11.5–15.5)
WBC: 6.6 10*3/uL (ref 4.0–10.5)

## 2014-09-24 LAB — POCT CBC
GRANULOCYTE PERCENT: 59.1 % (ref 37–80)
HCT, POC: 25.1 % — AB (ref 37.7–47.9)
Hemoglobin: 7.7 g/dL — AB (ref 12.2–16.2)
Lymph, poc: 2.1 (ref 0.6–3.4)
MCH, POC: 27 pg (ref 27–31.2)
MCHC: 30.8 g/dL — AB (ref 31.8–35.4)
MCV: 87.4 fL (ref 80–97)
MID (CBC): 0.4 (ref 0–0.9)
MPV: 6.7 fL (ref 0–99.8)
POC GRANULOCYTE: 3.7 (ref 2–6.9)
POC LYMPH %: 33.7 % (ref 10–50)
POC MID %: 7.2 %M (ref 0–12)
Platelet Count, POC: 439 10*3/uL — AB (ref 142–424)
RBC: 2.87 M/uL — AB (ref 4.04–5.48)
RDW, POC: 18.8 %
WBC: 6.2 10*3/uL (ref 4.6–10.2)

## 2014-09-24 LAB — PROTIME-INR
INR: 1 (ref 0.00–1.49)
Prothrombin Time: 13.3 seconds (ref 11.6–15.2)

## 2014-09-24 LAB — BASIC METABOLIC PANEL
Anion gap: 14 (ref 5–15)
BUN: 18 mg/dL (ref 6–23)
CHLORIDE: 102 meq/L (ref 96–112)
CO2: 25 meq/L (ref 19–32)
Calcium: 8.7 mg/dL (ref 8.4–10.5)
Creatinine, Ser: 0.79 mg/dL (ref 0.50–1.10)
GFR calc Af Amer: >90 mL/min (ref >90)
GFR calc non Af Amer: >90 mL/min (ref >90)
Glucose, Bld: 98 mg/dL (ref 70–99)
Potassium: 3.8 mEq/L (ref 3.7–5.3)
Sodium: 141 mEq/L (ref 137–147)

## 2014-09-24 MED ORDER — HYDROMORPHONE HCL 1 MG/ML IJ SOLN
1.0000 mg | Freq: Once | INTRAMUSCULAR | Status: AC
  Administered 2014-09-24: 1 mg via INTRAVENOUS
  Filled 2014-09-24: qty 1

## 2014-09-24 MED ORDER — OXYCODONE-ACETAMINOPHEN 5-325 MG PO TABS
2.0000 | ORAL_TABLET | Freq: Once | ORAL | Status: AC
  Administered 2014-09-24: 2 via ORAL
  Filled 2014-09-24: qty 2

## 2014-09-24 NOTE — Telephone Encounter (Signed)
Lm for rtn call if any further questions

## 2014-09-24 NOTE — ED Provider Notes (Signed)
CSN: [id number]     Arrival date & time 09/24/14  1827 History   First MD Initiated Contact with Patient 09/24/14 1843     Chief Complaint  Patient presents with  . Epistaxis     (Consider location/radiation/quality/duration/timing/severity/associated sxs/prior Treatment) HPI Patient is a 58 year old female with a history of gastric bypass surgery, former GI bleeding requiring multiple transfusions, who presents complaining of a nosebleed. She says she's been having a nearly continuous nosebleed for the last 3 days. She has intermittently been able to get it to stop with direct pressure, but it has bled more then it hasn't for the last several days. She went to the urgent care this afternoon, where she had a small area cauterized, but it continued to bleed. She denies any lightheadedness, does not have high blood pressure, has no history of bleeding disorders,, and had a nosebleed which is difficult to control, required packing partially 5 years ago.   She has not had a near syncopal episodes, does not feel faint or weak.  Past Medical History  Diagnosis Date  . Depression   . Arthritis   . Seasonal allergies   . Anemia   . Obese   . Sleep apnea 2012    took test in [place]-told to use cpap-could not ude   Past Surgical History  Procedure Laterality Date  . Total knee revision  07/30/2010    right; with lateral release  . Anterior cervical decomp/discectomy fusion  05/09/2008    C5-6, C6-7; with arthrodesis also  . Rotator cuff repair w/ distal clavicle excision  01/06/2006    right  . 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 Total knee arthroplasty  11/07/2003    right  . Shoulder arthroscopy  2003    rt  . Knee arthroscopy  2004    rt and lt knee  . Shoulder arthroscopy  2003    rt  . Tonsillectomy    . Gastric bypass  2002   Family History  Problem Relation Age of Onset  . Heart disease Mother   . Cancer Father    History  Substance Use Topics  . Smoking  status: Former Smoker    Quit date: 05/16/1981  . Smokeless tobacco: Not on file  . Alcohol Use: 3.0 oz/week    5 Cans of beer per week   OB History    No data available     Review of Systems  Constitutional: Positive for fatigue.  HENT: Positive for nosebleeds. Negative for congestion.   Gastrointestinal: Positive for blood in stool.  Genitourinary: Negative for urgency.  Neurological: Negative for dizziness and light-headedness.  All other systems reviewed and are negative.     Allergies  Review of patient's allergies indicates no known allergies.  Home Medications   Prior to Admission medications   Medication Sig Start Date End Date Taking? Authorizing Provider  amphetamine-dextroamphetamine (ADDERALL) 20 MG tablet Take 20 mg by mouth 2 (two) times daily.   Yes Historical Provider, MD  cetirizine-pseudoephedrine (ZYRTEC-D) 5-120 MG per tablet Take 1 tablet by mouth 2 (two) times daily.   Yes Historical Provider, MD  DULoxetine (CYMBALTA) 60 MG capsule Take 60 mg by mouth 2 (two) times daily.   Yes Historical Provider, MD  FLUoxetine (PROZAC) 20 MG capsule Take 20-40 mg by mouth 2 (two) times daily.    Yes Historical Provider, MD  HYDROcodone-acetaminophen (NORCO) 10-325 MG per tablet Take 1 tablet by mouth every 6 (six) hours as  needed for moderate pain.  08/26/14  Yes Historical Provider, MD  oxymetazoline (AFRIN NASAL SPRAY) 0.05 % nasal spray Place 3 sprays into right nostril 2 (two) times daily as needed (nose bleeds). 09/23/14  Yes Sherren Mocha, MD  Potassium Gluconate 550 MG TABS Take 550 mg by mouth daily.   Yes Historical Provider, MD  Pyridoxine HCl (B-6 PO) Take by mouth.   Yes Historical Provider, MD  ciprofloxacin (CIPRO) 500 MG tablet Take 1 tablet (500 mg total) by mouth 2 (two) times daily. 03/01/14   Sherren Mocha, MD  fluconazole (DIFLUCAN) 150 MG tablet Take 1 tablet (150 mg total) by mouth once. Repeat if needed after 3 days 03/02/14   Sherren Mocha, MD   BP 126/76  mmHg  Pulse 85  Temp(Src) 98.7 F (37.1 C) (Oral)  Resp 14  Ht 5\' 6"  (1.676 m)  Wt 257 lb (116.574 kg)  BMI 41.50 kg/m2  SpO2 94% Physical Exam  Constitutional: She is oriented to person, place, and time. She appears well-developed and well-nourished.  HENT:  Head: Normocephalic and atraumatic.  Nose: Mucosal edema present. Epistaxis (from left narea only, no identifiable source of bleeding) is observed.  Eyes: Conjunctivae are normal. Pupils are equal, round, and reactive to light.  Neck: Normal range of motion. Neck supple.  Cardiovascular: Regular rhythm and normal heart sounds.   Tachycardia  Pulmonary/Chest: Effort normal and breath sounds normal. No respiratory distress. She has no wheezes.  Abdominal: Soft. Bowel sounds are normal. There is no tenderness. There is no guarding.  Musculoskeletal: Normal range of motion. She exhibits no edema.  Neurological: She is alert and oriented to person, place, and time.  Skin: Skin is warm and dry. There is pallor.  Psychiatric: She has a normal mood and affect.  Nursing note and vitals reviewed.   ED Course  EPISTAXIS MANAGEMENT Date/Time: 09/24/2014 10:46 PM Performed by: Erskine Emery Authorized by: Erskine Emery Consent: Verbal consent obtained. Local anesthetic: lidocaine 1% with epinephrine Anesthetic total: 5 ml Treatment site: left anterior Repair method: anterior pack Post-procedure assessment: bleeding stopped Treatment complexity: complex Recurrence: recurrence of recent bleed Patient tolerance: Patient tolerated the procedure well with no immediate complications   (including critical care time) Labs Review Labs Reviewed  CBC - Abnormal; Notable for the following:    RBC 2.86 (*)    Hemoglobin 7.6 (*)    HCT 24.6 (*)    RDW 17.5 (*)    Platelets 416 (*)    All other components within normal limits  BASIC METABOLIC PANEL  PROTIME-INR    Imaging Review No results found.   EKG Interpretation None       MDM   Final diagnoses:  None   58 year old female presenting with recurrent nosebleed. Had cautery performed at urgent care, but has continued to bleed. She has been bleeding continuously for 3 days, and has had a proximal 3 g drop in her hemoglobin, and was tachycardic on arrival.  The bleeding appears to be coming entirely from the left nostril, it was anesthetized, explored, and fiberoptic nasopharyngeal scope was used to examine the posterior nasopharynx, and no clear identifiable sites of bleeding were identified. She did continue to briskly bleed, and this resolved with packing.  Considering her significant drop in hemoglobin, and previous need for significant transfusions due to persistent GI bleeding, although it is most likely that the blood in her stool has been from blood she is swallowed, cannot rule out GI bleed as well  as this point, though it is unlikely. We will plan to admit the patient for observation overnight and a recheck of her hemoglobin in the morning.  Erskine Emeryhris Marybella Ethier, MD 09/25/14 [id number]  Glynn OctaveStephen Rancour, MD 09/25/14 [phone]

## 2014-09-24 NOTE — ED Notes (Signed)
Attempted to call report

## 2014-09-24 NOTE — Progress Notes (Signed)
Subjective:  This chart was scribed for Dr. [name], MD by Jarvis Morganaylor Ferguson, ED Scribe. This patient was seen in Room 10 and the patient's care was started at 5:04 PM.   Patient ID: Sydney HoffFonda T Berry, female    DOB: 11-26-1955, 58 y.o.   MRN: [id number]  Chief Complaint  Patient presents with  . Epistaxis  . Headache    HPI HPI Comments: Sydney HoffFonda T Jerry is a 58 y.o. female who presents to the [facility] complaining of intermittent, episodic, epistaxis. Pt was here yesterday and had to have her nose cauterized. Pt had been having 3 severe episodes of epistaxis of over a period of 2 days. . She states the cauterization helped yesterday until last night around 10:00 her nose began bleeding again and bleed for about 9 hours. She has had some associated diarrhea and melena due to swallowing a lot of blood. She also notes she has been having intermittent migraines for 3 weeks. She has been taking Tylenol with no relief. She cannot take Ibuprofen to a h/o peptic ulcers. She took 1 hydrocodone today for her HA and her HA is still there. She has been applying a heating pad and applying pressure to the head and that seems to help. She states the HA is localized in the bitemporal area and goes around vertex of the head. She has been having some coagulated post nasal drip. She denies any other URI symptoms or otalgia. She denies any dizziness or lightheadedness due to the epistaxis.    Past Medical History  Diagnosis Date  . Depression   . Arthritis   . Seasonal allergies   . Anemia   . Obese   . Sleep apnea 2012    took test in [place]-told to use cpap-could not ude   Current Outpatient Prescriptions on File Prior to Visit  Medication Sig Dispense Refill  . cetirizine-pseudoephedrine (ZYRTEC-D) 5-120 MG per tablet Take 1 tablet by mouth 2 (two) times daily.    . ciprofloxacin (CIPRO) 500 MG tablet Take 1 tablet (500 mg total) by mouth 2 (two) times daily. 14 tablet 0  .  DULoxetine (CYMBALTA) 60 MG capsule Take 60 mg by mouth 2 (two) times daily.    . fluconazole (DIFLUCAN) 150 MG tablet Take 1 tablet (150 mg total) by mouth once. Repeat if needed after 3 days 2 tablet 0  . FLUoxetine (PROZAC) 20 MG capsule Take 20 mg by mouth 4 (four) times daily.    Marland Kitchen. ibuprofen (ADVIL,MOTRIN) 800 MG tablet Take 800 mg by mouth every 8 (eight) hours as needed.    Marland Kitchen. oxymetazoline (AFRIN NASAL SPRAY) 0.05 % nasal spray Place 3 sprays into right nostril 2 (two) times daily as needed (nose bleeds). 30 mL 0  . phentermine (ADIPEX-P) 37.5 MG tablet Take 1 tablet (37.5 mg total) by mouth daily before breakfast. 30 tablet 0   No current facility-administered medications on file prior to visit.   No Known Allergies  Review of Systems  HENT: Positive for nosebleeds and postnasal drip. Negative for congestion, ear discharge, ear pain, hearing loss, rhinorrhea, sinus pressure, sneezing and sore throat.   Respiratory: Negative for cough.   Gastrointestinal: Positive for diarrhea. Negative for nausea and vomiting.       + melena (due to swallowing of blood)  Neurological: Positive for headaches (3 weeks). Negative for dizziness and light-headedness.   BP 142/90 mmHg  Pulse 106  Temp(Src) 98.8 F (37.1 C) (Oral)  Resp 18  Ht 5\' 6"  (1.676 m)  Wt 259 lb (117.482 kg)  BMI 41.82 kg/m2  SpO2 97%     Objective:   Physical Exam  Constitutional: She is oriented to person, place, and time. She appears well-developed and well-nourished. No distress.  HENT:  Head: Normocephalic and atraumatic.  Right Ear: Tympanic membrane, external ear and ear canal normal.  Left Ear: Hearing and external ear normal. There is hemotympanum (stripes of blood on the inside of left TM).  Eyes: Conjunctivae and EOM are normal. Pupils are equal, round, and reactive to light.  Neck: Neck supple. No tracheal deviation present.  Cardiovascular: Normal rate.   Pulmonary/Chest: Effort normal. No respiratory  distress.  Musculoskeletal: Normal range of motion.  Neurological: She is alert and oriented to person, place, and time.  Skin: Skin is warm and dry.  Psychiatric: She has a normal mood and affect. Her behavior is normal.  Nursing note and vitals reviewed.  Results for orders placed or performed in visit on 09/24/14  POCT CBC  Result Value Ref Range   WBC 6.2 4.6 - 10.2 K/uL   Lymph, poc 2.1 0.6 - 3.4   POC LYMPH PERCENT 33.7 10 - 50 %L   MID (cbc) 0.4 0 - 0.9   POC MID % 7.2 0 - 12 %M   POC Granulocyte 3.7 2 - 6.9   Granulocyte percent 59.1 37 - 80 %G   RBC 2.87 (A) 4.04 - 5.48 M/uL   Hemoglobin 7.7 (A) 12.2 - 16.2 g/dL   HCT, POC 16.125.1 (A) 09.637.7 - 47.9 %   MCV 87.4 80 - 97 fL   MCH, POC 27.0 27 - 31.2 pg   MCHC 30.8 (A) 31.8 - 35.4 g/dL   RDW, POC 04.518.8 %   Platelet Count, POC 439 (A) 142 - 424 K/uL   MPV 6.7 0 - 99.8 fL       Assessment & Plan:   Recommend head CT due to new development of HAs for 3 weeks and maxillary sinus pressure for the past 24 hours. Epistaxis - Plan: POCT CBC Sent to ER by non-emergent ambulance due to severity and rapidity of epistaxis with prior cbc confirming significant blood loss from recent epistaxis episodes.  I personally performed the services described in this documentation, which was scribed in my presence. The recorded information has been reviewed and considered, and addended by me as needed.  [name], MD MPH

## 2014-09-24 NOTE — ED Notes (Signed)
Resident MD packed pt.'s left nare .

## 2014-09-24 NOTE — ED Notes (Addendum)
EDP / Resident MD  explained results of tests and plan of care to pt. and admission plan . Nasal packing intact/ no bleeding .

## 2014-09-25 ENCOUNTER — Encounter (HOSPITAL_COMMUNITY): Payer: Self-pay | Admitting: Internal Medicine

## 2014-09-25 DIAGNOSIS — R040 Epistaxis: Secondary | ICD-10-CM | POA: Diagnosis not present

## 2014-09-25 DIAGNOSIS — D62 Acute posthemorrhagic anemia: Secondary | ICD-10-CM | POA: Diagnosis present

## 2014-09-25 LAB — CBC WITH DIFFERENTIAL/PLATELET
BASOS ABS: 0 10*3/uL (ref 0.0–0.1)
Basophils Relative: 1 % (ref 0–1)
Eosinophils Absolute: 0.3 10*3/uL (ref 0.0–0.7)
Eosinophils Relative: 6 % — ABNORMAL HIGH (ref 0–5)
HEMATOCRIT: 22.7 % — AB (ref 36.0–46.0)
HEMOGLOBIN: 7 g/dL — AB (ref 12.0–15.0)
LYMPHS PCT: 39 % (ref 12–46)
Lymphs Abs: 2 10*3/uL (ref 0.7–4.0)
MCH: 26.6 pg (ref 26.0–34.0)
MCHC: 30.8 g/dL (ref 30.0–36.0)
MCV: 86.3 fL (ref 78.0–100.0)
MONO ABS: 0.7 10*3/uL (ref 0.1–1.0)
Monocytes Relative: 13 % — ABNORMAL HIGH (ref 3–12)
NEUTROS ABS: 2.1 10*3/uL (ref 1.7–7.7)
NEUTROS PCT: 41 % — AB (ref 43–77)
Platelets: 385 10*3/uL (ref 150–400)
RBC: 2.63 MIL/uL — ABNORMAL LOW (ref 3.87–5.11)
RDW: 17.6 % — ABNORMAL HIGH (ref 11.5–15.5)
WBC: 5.1 10*3/uL (ref 4.0–10.5)

## 2014-09-25 LAB — COMPREHENSIVE METABOLIC PANEL
ALT: 15 U/L (ref 0–35)
ANION GAP: 13 (ref 5–15)
AST: 20 U/L (ref 0–37)
Albumin: 3.4 g/dL — ABNORMAL LOW (ref 3.5–5.2)
Alkaline Phosphatase: 78 U/L (ref 39–117)
BUN: 19 mg/dL (ref 6–23)
CO2: 27 mEq/L (ref 19–32)
CREATININE: 0.96 mg/dL (ref 0.50–1.10)
Calcium: 8.7 mg/dL (ref 8.4–10.5)
Chloride: 101 mEq/L (ref 96–112)
GFR calc Af Amer: 75 mL/min — ABNORMAL LOW (ref >90)
GFR calc non Af Amer: 64 mL/min — ABNORMAL LOW (ref >90)
Glucose, Bld: 105 mg/dL — ABNORMAL HIGH (ref 70–99)
Potassium: 3.6 mEq/L — ABNORMAL LOW (ref 3.7–5.3)
Sodium: 141 mEq/L (ref 137–147)
TOTAL PROTEIN: 5.7 g/dL — AB (ref 6.0–8.3)

## 2014-09-25 LAB — CBC
HCT: 22.6 % — ABNORMAL LOW (ref 36.0–46.0)
HEMOGLOBIN: 7 g/dL — AB (ref 12.0–15.0)
MCH: 26.8 pg (ref 26.0–34.0)
MCHC: 31 g/dL (ref 30.0–36.0)
MCV: 86.6 fL (ref 78.0–100.0)
Platelets: 384 10*3/uL (ref 150–400)
RBC: 2.61 MIL/uL — ABNORMAL LOW (ref 3.87–5.11)
RDW: 17.7 % — ABNORMAL HIGH (ref 11.5–15.5)
WBC: 5.1 10*3/uL (ref 4.0–10.5)

## 2014-09-25 LAB — HEMOGLOBIN AND HEMATOCRIT, BLOOD
HEMATOCRIT: 23.9 % — AB (ref 36.0–46.0)
HEMOGLOBIN: 7.4 g/dL — AB (ref 12.0–15.0)

## 2014-09-25 LAB — PREPARE RBC (CROSSMATCH)

## 2014-09-25 MED ORDER — THIAMINE HCL 100 MG/ML IJ SOLN
100.0000 mg | Freq: Every day | INTRAMUSCULAR | Status: DC
  Filled 2014-09-25: qty 1

## 2014-09-25 MED ORDER — LORAZEPAM 1 MG PO TABS
0.0000 mg | ORAL_TABLET | Freq: Four times a day (QID) | ORAL | Status: DC
  Administered 2014-09-26: 2 mg via ORAL
  Filled 2014-09-25: qty 2

## 2014-09-25 MED ORDER — MORPHINE SULFATE 2 MG/ML IJ SOLN
2.0000 mg | INTRAMUSCULAR | Status: DC | PRN
Start: 2014-09-25 — End: 2014-09-26
  Administered 2014-09-25: 2 mg via INTRAVENOUS
  Filled 2014-09-25: qty 1

## 2014-09-25 MED ORDER — DULOXETINE HCL 60 MG PO CPEP
60.0000 mg | ORAL_CAPSULE | Freq: Two times a day (BID) | ORAL | Status: DC
  Administered 2014-09-25 – 2014-09-26 (×3): 60 mg via ORAL
  Filled 2014-09-25 (×4): qty 1

## 2014-09-25 MED ORDER — ACETAMINOPHEN 650 MG RE SUPP: 650.0000 mg | Freq: Four times a day (QID) | RECTAL | Status: DC | PRN

## 2014-09-25 MED ORDER — AMPHETAMINE-DEXTROAMPHETAMINE 10 MG PO TABS
20.0000 mg | ORAL_TABLET | Freq: Two times a day (BID) | ORAL | Status: DC
  Administered 2014-09-25 – 2014-09-26 (×3): 20 mg via ORAL
  Filled 2014-09-25 (×4): qty 2

## 2014-09-25 MED ORDER — FLUOXETINE HCL 20 MG PO CAPS
20.0000 mg | ORAL_CAPSULE | Freq: Two times a day (BID) | ORAL | Status: DC
  Administered 2014-09-25 – 2014-09-26 (×3): 20 mg via ORAL
  Filled 2014-09-25 (×4): qty 1

## 2014-09-25 MED ORDER — FLUCONAZOLE 150 MG PO TABS
150.0000 mg | ORAL_TABLET | Freq: Once | ORAL | Status: AC
  Administered 2014-09-25: 150 mg via ORAL
  Filled 2014-09-25: qty 1

## 2014-09-25 MED ORDER — ONDANSETRON HCL 4 MG PO TABS: 4.0000 mg | ORAL_TABLET | Freq: Four times a day (QID) | ORAL | Status: DC | PRN

## 2014-09-25 MED ORDER — LORAZEPAM 1 MG PO TABS
0.0000 mg | ORAL_TABLET | Freq: Two times a day (BID) | ORAL | Status: DC
Start: 2014-09-27 — End: 2014-09-26

## 2014-09-25 MED ORDER — ADULT MULTIVITAMIN W/MINERALS CH
1.0000 | ORAL_TABLET | Freq: Every day | ORAL | Status: DC
  Administered 2014-09-25 – 2014-09-26 (×2): 1 via ORAL
  Filled 2014-09-25 (×2): qty 1

## 2014-09-25 MED ORDER — ONDANSETRON HCL 4 MG/2ML IJ SOLN: 4.0000 mg | Freq: Four times a day (QID) | INTRAMUSCULAR | Status: DC | PRN

## 2014-09-25 MED ORDER — ACETAMINOPHEN 325 MG PO TABS: 650.0000 mg | ORAL_TABLET | Freq: Four times a day (QID) | ORAL | Status: DC | PRN

## 2014-09-25 MED ORDER — LORAZEPAM 1 MG PO TABS: 1.0000 mg | ORAL_TABLET | Freq: Four times a day (QID) | ORAL | Status: DC | PRN

## 2014-09-25 MED ORDER — LORAZEPAM 2 MG/ML IJ SOLN
1.0000 mg | Freq: Four times a day (QID) | INTRAMUSCULAR | Status: DC | PRN
  Administered 2014-09-25: 1 mg via INTRAVENOUS
  Filled 2014-09-25: qty 1

## 2014-09-25 MED ORDER — AMOXICILLIN-POT CLAVULANATE 875-125 MG PO TABS
1.0000 | ORAL_TABLET | Freq: Two times a day (BID) | ORAL | Status: DC
  Administered 2014-09-25 – 2014-09-26 (×3): 1 via ORAL
  Filled 2014-09-25 (×4): qty 1

## 2014-09-25 MED ORDER — SODIUM CHLORIDE 0.9 % IV SOLN
INTRAVENOUS | Status: DC
Start: 2014-09-25 — End: 2014-09-25
  Administered 2014-09-25: 01:00:00 via INTRAVENOUS

## 2014-09-25 MED ORDER — HYDROCODONE-ACETAMINOPHEN 10-325 MG PO TABS
1.0000 | ORAL_TABLET | Freq: Four times a day (QID) | ORAL | Status: DC | PRN
  Administered 2014-09-25 – 2014-09-26 (×4): 1 via ORAL
  Filled 2014-09-25 (×5): qty 1

## 2014-09-25 MED ORDER — VITAMIN B-1 100 MG PO TABS
100.0000 mg | ORAL_TABLET | Freq: Every day | ORAL | Status: DC
Start: 2014-09-25 — End: 2014-09-26
  Administered 2014-09-25 – 2014-09-26 (×2): 100 mg via ORAL
  Filled 2014-09-25 (×2): qty 1

## 2014-09-25 MED ORDER — FOLIC ACID 1 MG PO TABS
1.0000 mg | ORAL_TABLET | Freq: Every day | ORAL | Status: DC
  Administered 2014-09-25 – 2014-09-26 (×2): 1 mg via ORAL
  Filled 2014-09-25 (×2): qty 1

## 2014-09-25 MED ORDER — SODIUM CHLORIDE 0.9 % IV SOLN
Freq: Once | INTRAVENOUS | Status: AC
  Administered 2014-09-25: 06:00:00 via INTRAVENOUS

## 2014-09-25 MED ORDER — POTASSIUM CHLORIDE CRYS ER 10 MEQ PO TBCR
10.0000 meq | EXTENDED_RELEASE_TABLET | Freq: Every day | ORAL | Status: DC
  Administered 2014-09-25 – 2014-09-26 (×2): 10 meq via ORAL
  Filled 2014-09-25 (×2): qty 1

## 2014-09-25 NOTE — H&P (Signed)
[facility] History and Physical  Sydney HoffFonda T Stebbins OZD:[id number] DOB: September 06, 1956 DOA: 09/24/2014  Referring physician: ER physician. PCP: Norberto SorensonSHAW,EVA, MD   Chief Complaint: Epistaxis.  HPI: [name] is a 58 y.o. female with history of fibromyalgia and recent GI bleed and was admitted in [place] in May of this year presents to the ER because of persistent epistaxis. Patient has been having epistaxis over the last 3 days and call to her PCP earlier yesterday and had cauterization done despite which patient continued to bleed and presented to the ER. In the ER patient had nasal package done following which patient's bleeding stopped. Patient has had dropped 2 g of hemoglobin from her baseline and is around 7. At this time patient will be admitted for observation. He otherwise denies any chest pain or shortness of breath. Patient does drink alcohol every day.   Review of Systems: As presented in the history of presenting illness, rest negative.  Past Medical History  Diagnosis Date  . Depression   . Arthritis   . Seasonal allergies   . Anemia   . Obese   . Sleep apnea 2012    took test in [place]-told to use cpap-could not ude   Past Surgical History  Procedure Laterality Date  . Total knee revision  07/30/2010    right; with lateral release  . Anterior cervical decomp/discectomy fusion  05/09/2008    C5-6, C6-7; with arthrodesis also  . Rotator cuff repair w/ distal clavicle excision  01/06/2006    right  . 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 Total knee arthroplasty  11/07/2003    right  . Shoulder arthroscopy  2003    rt  . Knee arthroscopy  2004    rt and lt knee  . Shoulder arthroscopy  2003    rt  . Tonsillectomy    . Gastric bypass  2002   Social History:  reports that she quit smoking about 33 years ago. She does not have any smokeless tobacco history on file. She reports that she drinks about 3.0 oz of alcohol per week. She reports that she does not  use illicit drugs. Where does patient live home. Can patient participate in ADLs? Yes.  No Known Allergies  Family History:  Family History  Problem Relation Age of Onset  . Heart disease Mother   . Cancer Father       Prior to Admission medications   Medication Sig Start Date End Date Taking? Authorizing Provider  amphetamine-dextroamphetamine (ADDERALL) 20 MG tablet Take 20 mg by mouth 2 (two) times daily.   Yes Historical Provider, MD  cetirizine-pseudoephedrine (ZYRTEC-D) 5-120 MG per tablet Take 1 tablet by mouth 2 (two) times daily.   Yes Historical Provider, MD  DULoxetine (CYMBALTA) 60 MG capsule Take 60 mg by mouth 2 (two) times daily.   Yes Historical Provider, MD  FLUoxetine (PROZAC) 20 MG capsule Take 20-40 mg by mouth 2 (two) times daily.    Yes Historical Provider, MD  HYDROcodone-acetaminophen (NORCO) 10-325 MG per tablet Take 1 tablet by mouth every 6 (six) hours as needed for moderate pain.  08/26/14  Yes Historical Provider, MD  oxymetazoline (AFRIN NASAL SPRAY) 0.05 % nasal spray Place 3 sprays into right nostril 2 (two) times daily as needed (nose bleeds). 09/23/14  Yes Sherren MochaEva N Shaw, MD  Potassium Gluconate 550 MG TABS Take 550 mg by mouth daily.   Yes Historical Provider, MD  Pyridoxine HCl (B-6 PO) Take  by mouth.   Yes Historical Provider, MD  ciprofloxacin (CIPRO) 500 MG tablet Take 1 tablet (500 mg total) by mouth 2 (two) times daily. 03/01/14   Sherren MochaEva N Shaw, MD  fluconazole (DIFLUCAN) 150 MG tablet Take 1 tablet (150 mg total) by mouth once. Repeat if needed after 3 days 03/02/14   Sherren MochaEva N Shaw, MD    Physical Exam: Filed Vitals:   09/24/14 2300 09/24/14 2330 09/25/14 0000 09/25/14 0031  BP: 134/53 144/61 126/76 149/64  Pulse: 97 88 85 85  Temp:    97.8 F (36.6 C)  TempSrc:    Oral  Resp:    16  Height:      Weight:      SpO2: 94% 94% 94% 97%     General:  Well-developed and nourished.  Eyes: Anicteric no pallor.  ENT: Left nasal packing seen.  Neck: No  neck rigidity or mass felt.  Cardiovascular: S1 and S2 heard.  Respiratory: No rhonchi or crepitations.  Abdomen: Soft nontender bowel sounds present.  Skin: No rash.  Musculoskeletal: No edema.  Psychiatric: Appears normal.  Neurologic: Alert awake oriented to time place and person. Moves all extremities.  Labs on Admission:  Basic Metabolic Panel:  Recent Labs Lab 09/24/14 1900  NA 141  K 3.8  CL 102  CO2 25  GLUCOSE 98  BUN 18  CREATININE 0.79  CALCIUM 8.7   Liver Function Tests: No results for input(s): AST, ALT, ALKPHOS, BILITOT, PROT, ALBUMIN in the last 168 hours. No results for input(s): LIPASE, AMYLASE in the last 168 hours. No results for input(s): AMMONIA in the last 168 hours. CBC:  Recent Labs Lab 09/24/14 1734 09/24/14 1900  WBC 6.2 6.6  HGB 7.7* 7.6*  HCT 25.1* 24.6*  MCV 87.4 86.0  PLT  --  416*   Cardiac Enzymes: No results for input(s): CKTOTAL, CKMB, CKMBINDEX, TROPONINI in the last 168 hours.  BNP (last 3 results) No results for input(s): PROBNP in the last 8760 hours. CBG: No results for input(s): GLUCAP in the last 168 hours.  Radiological Exams on Admission: No results found.   Assessment/Plan Principal Problem:   Epistaxis Active Problems:   Acute blood loss anemia   1. Epistaxis - at this time controlled after nasal pack. Since patient has 2 g drop in hemoglobin and is around 7.6 we will closely follow for any rebleeding or further drop in hemoglobin. There is no rebleeding following hemoglobin then patient may be discharged home to follow-up with ENT which will need to be arranged. Patient has been placed empirically on Augmentin for now. 2. Blood loss anemia - closely follow CBC and if there is any further drop in hemoglobin patient may need transfusion. Patient presently hemodynamically stable. 3. Alcohol abuse - patient has been placed on Ativan protocol and thiamine. 4. History of GI bleed in May of this year from  gastric ulcer as per the patient. 5. Fibromyalgia - continue present medication.    Code Status: Full code.  Family Communication: None.  Disposition Plan: Admit for observation.    KAKRAKANDY,ARSHAD N. [facility] Pager [phone].  If 7PM-7AM, please contact night-coverage [email or web address] Password [email or web address] 09/25/2014, 12:41 AM

## 2014-09-25 NOTE — Progress Notes (Signed)
UR completed 

## 2014-09-25 NOTE — Progress Notes (Signed)
[facility] PROGRESS NOTE  Thomas HoffFonda T Helseth [id number] DOB: 1956/05/28 DOA: 09/24/2014 PCP: Norberto SorensonSHAW,EVA, MD  Assessment/Plan: 58 y.o. female with history of fibromyalgia and recent GI bleed and was admitted in [place] in May of this year presents to the ER because of persistent epistaxis. Patient has been having epistaxis over the last 3 days and call to her PCP earlier yesterday and had cauterization done despite which patient continued to bleed and presented to the ER  1. Epistaxis s/p nasal packing done in ED; patient reports some mild residual bleeding; on exam no obvious bleeding  -cont nasal packing, cont atx;  2. Acute blood loss anemia due to epistaxis  -TFsed 1 units on 12/3; pend repeat Hg; monitor Tf prn  3. Alcohol abuse; denies h/o DTs; monitor on CIWA  4. Chronic pain on opioids; monitor for overdose;  5. H/o GIB, denies acute s/s of gi bleeding at this time   Possible d/c 24-48 hrs if no further bleeding   Code Status: full Family Communication: d/w patient (indicate person spoken with, relationship, and if by phone, the number) Disposition Plan: 24-48 hrs    Consultants:  none  Procedures:  none  Antibiotics:  Augmentin12/3<<<   (indicate start date, and stop date if known)  HPI/Subjective: Alert, oriented   Objective: Filed Vitals:   09/25/14 0857  BP: 145/59  Pulse: 96  Temp: 98 F (36.7 C)  Resp: 16    Intake/Output Summary (Last 24 hours) at 09/25/14 1055 Last data filed at 09/25/14 0851  Gross per 24 hour  Intake    335 ml  Output      0 ml  Net    335 ml   Filed Weights   09/24/14 1831 09/25/14 0500  Weight: 116.574 kg (257 lb) 119.84 kg (264 lb 3.2 oz)    Exam:   General:  alert  Cardiovascular: s1,s2 rrr  Respiratory: CTA BL  Abdomen: soft, nt,dn   Musculoskeletal: no LE edema   Data Reviewed: Basic Metabolic Panel:  Recent Labs Lab 09/24/14 1900 09/25/14 0244  NA 141 141  K 3.8 3.6*  CL 102 101  CO2  25 27  GLUCOSE 98 105*  BUN 18 19  CREATININE 0.79 0.96  CALCIUM 8.7 8.7   Liver Function Tests:  Recent Labs Lab 09/25/14 0244  AST 20  ALT 15  ALKPHOS 78  BILITOT <0.2*  PROT 5.7*  ALBUMIN 3.4*   No results for input(s): LIPASE, AMYLASE in the last 168 hours. No results for input(s): AMMONIA in the last 168 hours. CBC:  Recent Labs Lab 09/24/14 1734 09/24/14 1900 09/25/14 0244  WBC 6.2 6.6 5.1  5.1  NEUTROABS  --   --  2.1  HGB 7.7* 7.6* 7.0*  7.0*  HCT 25.1* 24.6* 22.6*  22.7*  MCV 87.4 86.0 86.6  86.3  PLT  --  416* 384  385   Cardiac Enzymes: No results for input(s): CKTOTAL, CKMB, CKMBINDEX, TROPONINI in the last 168 hours. BNP (last 3 results) No results for input(s): PROBNP in the last 8760 hours. CBG: No results for input(s): GLUCAP in the last 168 hours.  No results found for this or any previous visit (from the past 240 hour(s)).   Studies: No results found.  Scheduled Meds: . amoxicillin-clavulanate  1 tablet Oral Q12H  . amphetamine-dextroamphetamine  20 mg Oral BID WC  . DULoxetine  60 mg Oral BID  . FLUoxetine  20 mg Oral BID  . folic acid  1 mg  Oral Daily  . LORazepam  0-4 mg Oral 4 times per day   Followed by  . [START ON 09/27/2014] LORazepam  0-4 mg Oral Q12H  . multivitamin with minerals  1 tablet Oral Daily  . thiamine  100 mg Oral Daily   Or  . thiamine  100 mg Intravenous Daily   Continuous Infusions: . sodium chloride 75 mL/hr at 09/25/14 0128    Principal Problem:   Epistaxis Active Problems:   Acute blood loss anemia    Time spent: >35 minutes     Esperanza SheetsBURIEV, Khyri Hinzman N  [facility] Pager [phone]. If 7PM-7AM, please contact night-coverage at [email or web address], password [email or web address] 09/25/2014, 10:55 AM  LOS: 1 day

## 2014-09-25 NOTE — Plan of Care (Signed)
Problem: Phase I Progression Outcomes Goal: Pain controlled with appropriate interventions Outcome: Completed/Met Date Met:  09/25/14 Goal: OOB as tolerated unless otherwise ordered Outcome: Completed/Met Date Met:  09/25/14 Goal: Voiding-avoid urinary catheter unless indicated Outcome: Completed/Met Date Met:  09/25/14 Goal: Hemodynamically stable Outcome: Completed/Met Date Met:  09/25/14

## 2014-09-26 DIAGNOSIS — R040 Epistaxis: Secondary | ICD-10-CM | POA: Diagnosis not present

## 2014-09-26 LAB — TYPE AND SCREEN
ABO/RH(D): O POS
Antibody Screen: NEGATIVE
Unit division: 0

## 2014-09-26 LAB — HEMOGLOBIN AND HEMATOCRIT, BLOOD
HCT: 23.6 % — ABNORMAL LOW (ref 36.0–46.0)
HEMOGLOBIN: 7.3 g/dL — AB (ref 12.0–15.0)

## 2014-09-26 MED ORDER — FERROUS SULFATE 325 (65 FE) MG PO TABS: 325.0000 mg | ORAL_TABLET | Freq: Two times a day (BID) | ORAL | Status: DC

## 2014-09-26 MED ORDER — AMOXICILLIN-POT CLAVULANATE 875-125 MG PO TABS: 1.0000 | ORAL_TABLET | Freq: Two times a day (BID) | ORAL | Status: DC

## 2014-09-26 NOTE — Discharge Summary (Signed)
Physician Discharge Summary  Thomas HoffFonda T Hilger VHQ:[id number]:[id number] DOB: 11-15-1955 DOA: 09/24/2014  PCP: Norberto SorensonSHAW,EVA, MD  Admit date: 09/24/2014 Discharge date: 09/26/2014  Time spent: > 35 minutes  Recommendations for Outpatient Follow-up:  1. Consider reassessing hemoglobin levels within the next 1 week  Discharge Diagnoses:  Principal Problem:   Epistaxis Active Problems:   Acute blood loss anemia   Discharge Condition: stable, no active bleeding  Diet recommendation: regular diet.  Filed Weights   09/24/14 1831 09/25/14 0500  Weight: 116.574 kg (257 lb) 119.84 kg (264 lb 3.2 oz)    History of present illness:  58 y/o female with h/o fibromyalgia and recent GI bleed and was [place] in [place] in May of this year presents to the ER because of persistent epistaxis.  Hospital Course:  Epistaxis - discussed with ENT specialist. Plan is to leave nasal packing in for 5 days total. Patient will be placed on antibiotics while packing is in.   - Iron supplementation will be administered - Pt to f/u with ent specialist 09/29/14 - will d/c patient on augmentin  Procedures:  Nasal packing  Consultations:  Discussed over the phone with ENT  Discharge Exam: Filed Vitals:   09/26/14 0953  BP: 147/79  Pulse: 108  Temp: 98.7 F (37.1 C)  Resp: 19    General: pt in nad, alert and awake Cardiovascular: rrr, no mrg Respiratory: cta bl, no wheezes  Discharge Instructions You were cared for by a hospitalist during your hospital stay. If you have any questions about your discharge medications or the care you received while you were in the hospital after you are discharged, you can call the unit and asked to speak with the hospitalist on call if the hospitalist that took care of you is not available. Once you are discharged, your primary care physician will handle any further medical issues. Please note that NO REFILLS for any discharge medications will be authorized once you are  discharged, as it is imperative that you return to your primary care physician (or establish a relationship with a primary care physician if you do not have one) for your aftercare needs so that they can reassess your need for medications and monitor your lab values.  Discharge Instructions    Call MD for:  difficulty breathing, headache or visual disturbances    Complete by:  As directed      Call MD for:  severe uncontrolled pain    Complete by:  As directed      Call MD for:  temperature >100.4    Complete by:  As directed      Diet - low sodium heart healthy    Complete by:  As directed      Discharge instructions    Complete by:  As directed   Please follow up with an ENT on 09/29/14. Please call [phone] to confirm appointment with Dr. Randa SpikeShoemaker     Increase activity slowly    Complete by:  As directed           Current Discharge Medication List    START taking these medications   Details  amoxicillin-clavulanate (AUGMENTIN) 875-125 MG per tablet Take 1 tablet by mouth every 12 (twelve) hours. Qty: 12 tablet, Refills: 0    ferrous sulfate 325 (65 FE) MG tablet Take 1 tablet (325 mg total) by mouth 2 (two) times daily with a meal. Qty: 60 tablet, Refills: 0      CONTINUE these medications which have NOT CHANGED  Details  amphetamine-dextroamphetamine (ADDERALL) 20 MG tablet Take 20 mg by mouth 2 (two) times daily.    cetirizine-pseudoephedrine (ZYRTEC-D) 5-120 MG per tablet Take 1 tablet by mouth 2 (two) times daily.    DULoxetine (CYMBALTA) 60 MG capsule Take 60 mg by mouth 2 (two) times daily.    FLUoxetine (PROZAC) 20 MG capsule Take 20 mg by mouth 2 (two) times daily.     HYDROcodone-acetaminophen (NORCO) 10-325 MG per tablet Take 1 tablet by mouth every 6 (six) hours as needed for moderate pain.  Refills: 0    Potassium Gluconate 550 MG TABS Take 550 mg by mouth daily.    Pyridoxine HCl (B-6 PO) Take by mouth.      STOP taking these medications      oxymetazoline (AFRIN NASAL SPRAY) 0.05 % nasal spray      ciprofloxacin (CIPRO) 500 MG tablet      fluconazole (DIFLUCAN) 150 MG tablet        No Known Allergies    The results of significant diagnostics from this hospitalization (including imaging, microbiology, ancillary and laboratory) are listed below for reference.    Significant Diagnostic Studies: No results found.  Microbiology: No results found for this or any previous visit (from the past 240 hour(s)).   Labs: Basic Metabolic Panel:  Recent Labs Lab 09/24/14 1900 09/25/14 0244  NA 141 141  K 3.8 3.6*  CL 102 101  CO2 25 27  GLUCOSE 98 105*  BUN 18 19  CREATININE 0.79 0.96  CALCIUM 8.7 8.7   Liver Function Tests:  Recent Labs Lab 09/25/14 0244  AST 20  ALT 15  ALKPHOS 78  BILITOT <0.2*  PROT 5.7*  ALBUMIN 3.4*   No results for input(s): LIPASE, AMYLASE in the last 168 hours. No results for input(s): AMMONIA in the last 168 hours. CBC:  Recent Labs Lab 09/24/14 1734 09/24/14 1900 09/25/14 0244 09/25/14 1956 09/26/14 0730  WBC 6.2 6.6 5.1  5.1  --   --   NEUTROABS  --   --  2.1  --   --   HGB 7.7* 7.6* 7.0*  7.0* 7.4* 7.3*  HCT 25.1* 24.6* 22.6*  22.7* 23.9* 23.6*  MCV 87.4 86.0 86.6  86.3  --   --   PLT  --  416* 384  385  --   --    Cardiac Enzymes: No results for input(s): CKTOTAL, CKMB, CKMBINDEX, TROPONINI in the last 168 hours. BNP: BNP (last 3 results) No results for input(s): PROBNP in the last 8760 hours. CBG: No results for input(s): GLUCAP in the last 168 hours.     Signed:  Penny PiaVEGA, Tahj Lindseth  [facility] 09/26/2014, 1:02 PM

## 2014-09-29 NOTE — Progress Notes (Signed)
LMVM for patient to CB to schedule a one month appt with Dr. Clelia CroftShaw.

## 2014-10-03 ENCOUNTER — Telehealth: Payer: Self-pay | Admitting: Family Medicine

## 2014-10-03 NOTE — Telephone Encounter (Signed)
LMOM to CB. 

## 2014-10-03 NOTE — Telephone Encounter (Signed)
-----   Message from Sherren MochaEva N Shaw, MD sent at 09/23/2014 11:21 AM EST ----- Please see if pt would like to sched f/u OV for weight loss in 1 mo

## 2014-10-13 NOTE — Progress Notes (Signed)
LMVM advising patient that she has an appt scheduled with Dr. Clelia CroftShaw 11/21/14 @ 2:00.  Please call back with any questions or concerns.

## 2014-11-04 ENCOUNTER — Other Ambulatory Visit: Payer: Self-pay | Admitting: Family Medicine

## 2014-11-21 ENCOUNTER — Ambulatory Visit (INDEPENDENT_AMBULATORY_CARE_PROVIDER_SITE_OTHER): Payer: BC Managed Care – PPO | Admitting: Family Medicine

## 2014-11-21 ENCOUNTER — Encounter: Payer: Self-pay | Admitting: Family Medicine

## 2014-11-21 VITALS — BP 160/97 | HR 85 | Temp 98.1°F | Resp 20 | Ht 65.5 in | Wt 265.6 lb

## 2014-11-21 DIAGNOSIS — F411 Generalized anxiety disorder: Secondary | ICD-10-CM | POA: Insufficient documentation

## 2014-11-21 DIAGNOSIS — E669 Obesity, unspecified: Secondary | ICD-10-CM | POA: Insufficient documentation

## 2014-11-21 DIAGNOSIS — R030 Elevated blood-pressure reading, without diagnosis of hypertension: Secondary | ICD-10-CM

## 2014-11-21 DIAGNOSIS — F1010 Alcohol abuse, uncomplicated: Secondary | ICD-10-CM | POA: Insufficient documentation

## 2014-11-21 DIAGNOSIS — Z6841 Body Mass Index (BMI) 40.0 and over, adult: Secondary | ICD-10-CM

## 2014-11-21 DIAGNOSIS — IMO0001 Reserved for inherently not codable concepts without codable children: Secondary | ICD-10-CM

## 2014-11-21 DIAGNOSIS — D649 Anemia, unspecified: Secondary | ICD-10-CM

## 2014-11-21 LAB — CBC
HCT: 31.7 % — ABNORMAL LOW (ref 36.0–46.0)
HEMOGLOBIN: 9.8 g/dL — AB (ref 12.0–15.0)
MCH: 27.5 pg (ref 26.0–34.0)
MCHC: 30.9 g/dL (ref 30.0–36.0)
MCV: 89 fL (ref 78.0–100.0)
MPV: 9 fL (ref 8.6–12.4)
PLATELETS: 408 10*3/uL — AB (ref 150–400)
RBC: 3.56 MIL/uL — ABNORMAL LOW (ref 3.87–5.11)
RDW: 17.1 % — AB (ref 11.5–15.5)
WBC: 5.4 10*3/uL (ref 4.0–10.5)

## 2014-11-21 LAB — COMPREHENSIVE METABOLIC PANEL
ALBUMIN: 4.2 g/dL (ref 3.5–5.2)
ALT: 14 U/L (ref 0–35)
AST: 20 U/L (ref 0–37)
Alkaline Phosphatase: 66 U/L (ref 39–117)
BUN: 13 mg/dL (ref 6–23)
CO2: 28 meq/L (ref 19–32)
Calcium: 9.2 mg/dL (ref 8.4–10.5)
Chloride: 105 mEq/L (ref 96–112)
Creat: 0.99 mg/dL (ref 0.50–1.10)
Glucose, Bld: 92 mg/dL (ref 70–99)
POTASSIUM: 4.3 meq/L (ref 3.5–5.3)
Sodium: 142 mEq/L (ref 135–145)
Total Bilirubin: 0.4 mg/dL (ref 0.2–1.2)
Total Protein: 6.3 g/dL (ref 6.0–8.3)

## 2014-11-21 MED ORDER — CHLORDIAZEPOXIDE HCL 10 MG PO CAPS: 10.0000 mg | ORAL_CAPSULE | Freq: Three times a day (TID) | ORAL | Status: DC | PRN

## 2014-11-21 NOTE — Patient Instructions (Signed)
Finding Treatment for Alcohol and Drug Addiction It can be hard to find the right place to get professional treatment. Here are some important things to consider:  There are different types of treatment to choose from.  Some programs are live-in (residential) while others are not (outpatient). Sometimes a combination is offered.  No single type of program is right for everyone.  Most treatment programs involve a combination of education, counseling, and a 12-step, spiritually-based approach.  There are non-spiritually based programs (not 12-step).  Some treatment programs are government sponsored. They are geared for patients without private insurance.  Treatment programs can vary in many respects such as:  Cost and types of insurance accepted.  Types of on-site medical services offered.  Length of stay, setting, and size.  Overall philosophy of treatment. A person may need specialized treatment or have needs not addressed by all programs. For example, adolescents need treatment appropriate for their age. Other people have secondary disorders that must be managed as well. Secondary conditions can include mental illness, such as depression or diabetes. Often, a period of detoxification from alcohol or drugs is needed. This requires medical supervision and not all programs offer this. THINGS TO CONSIDER WHEN SELECTING A TREATMENT PROGRAM   Is the program certified by the appropriate government agency? Even private programs must be certified and employ certified professionals.  Does the program accept your insurance? If not, can a payment plan be set up?  Is the facility clean, organized, and well run? Do they allow you to speak with graduates who can share their treatment experience with you? Can you tour the facility? Can you meet with staff?  Does the program meet the full range of individual needs?  Does the treatment program address sexual orientation and physical disabilities?  Do they provide age, gender, and culturally appropriate treatment services?  Is treatment available in languages other than English?  Is long-term aftercare support or guidance encouraged and provided?  Is assessment of an individual's treatment plan ongoing to ensure it meets changing needs?  Does the program use strategies to encourage reluctant patients to remain in treatment long enough to increase the likelihood of success?  Does the program offer counseling (individual or group) and other behavioral therapies?  Does the program offer medicine as part of the treatment regimen, if needed?  Is there ongoing monitoring of possible relapse? Is there a defined relapse prevention program? Are services or referrals offered to family members to ensure they understand addiction and the recovery process? This would help them support the recovering individual.  Are 12-step meetings held at the center or is transport available for patients to attend outside meetings? In countries outside of the [place]. and [place], Magazine features editor for contact information for services in your area. Document Released: 09/08/2005 Document Revised: 01/02/2012 Document Reviewed: 03/20/2008 [facility] Patient Information 2015 [place], [place]. This information is not intended to replace advice given to you by your health care provider. Make sure you discuss any questions you have with your health care provider.  Alcohol Withdrawal Anytime drug use is interfering with normal living activities it has become abuse. This includes problems with family and friends. Psychological dependence has developed when your mind tells you that the drug is needed. This is usually followed by physical dependence when a continuing increase of drugs are required to get the same feeling or "high." This is known as addiction or chemical dependency. A person's risk is much higher if there is a history of chemical  dependency in the family. Mild  Withdrawal Following Stopping Alcohol, When Addiction or Chemical Dependency Has Developed When a person has developed tolerance to alcohol, any sudden stopping of alcohol can cause uncomfortable physical symptoms. Most of the time these are mild and consist of tremors in the hands and increases in heart rate, breathing, and temperature. Sometimes these symptoms are associated with anxiety, panic attacks, and bad dreams. There may also be stomach upset. Normal sleep patterns are often interrupted with periods of inability to sleep (insomnia). This may last for 6 months. Because of this discomfort, many people choose to continue drinking to get rid of this discomfort and to try to feel normal. Severe Withdrawal with Decreased or No Alcohol Intake, When Addiction or Chemical Dependency Has Developed About five percent of alcoholics will develop signs of severe withdrawal when they stop using alcohol. One sign of this is development of generalized seizures (convulsions). Other signs of this are severe agitation and confusion. This may be associated with believing in things which are not real or seeing things which are not really there (delusions and hallucinations). Vitamin deficiencies are usually present if alcohol intake has been long-term. Treatment for this most often requires hospitalization and close observation. Addiction can only be helped by stopping use of all chemicals. This is hard but may save your life. With continual alcohol use, possible outcomes are usually loss of self respect and esteem, violence, and death. Addiction cannot be cured but it can be stopped. This often requires outside help and the care of professionals. Treatment centers are listed in the yellow pages under Cocaine, Narcotics, and Alcoholics Anonymous. Most hospitals and clinics can refer you to a specialized care center. It is not necessary for you to go through the uncomfortable symptoms of withdrawal. Your caregiver can  provide you with medicines that will help you through this difficult period. Try to avoid situations, friends, or drugs that made it possible for you to keep using alcohol in the past. Learn how to say no. It takes a long period of time to overcome addictions to all drugs, including alcohol. There may be many times when you feel as though you want a drink. After getting rid of the physical addiction and withdrawal, you will have a lessening of the craving which tells you that you need alcohol to feel normal. Call your caregiver if more support is needed. Learn who to talk to in your family and among your friends so that during these periods you can receive outside help. Alcoholics Anonymous (AA) has helped many people over the years. To get further help, contact AA or call your caregiver, counselor, or clergyperson. Al-Anon and Alateen are support groups for friends and family members of an alcoholic. The people who love and care for an alcoholic often need help, too. For information about these organizations, check your phone directory or call a local alcoholism treatment center.  SEEK IMMEDIATE MEDICAL CARE IF:   You have a seizure.  You have a fever.  You experience uncontrolled vomiting or you vomit up blood. This may be bright red or look like black coffee grounds.  You have blood in the stool. This may be bright red or appear as a black, tarry, bad-smelling stool.  You become lightheaded or faint. Do not drive if you feel this way. Have someone else drive you or call [id number] for help.  You become more agitated or confused.  You develop uncontrolled anxiety.  You begin to see things that are  not really there (hallucinate). Your caregiver has determined that you completely understand your medical condition, and that your mental state is back to normal. You understand that you have been treated for alcohol withdrawal, have agreed not to drink any alcohol for a minimum of 1 day, will not operate a  car or other machinery for 24 hours, and have had an opportunity to ask any questions about your condition. Document Released: 07/20/2005 Document Revised: 01/02/2012 Document Reviewed: 05/28/2008 [facility] Patient Information 2015 [place], [place]. This information is not intended to replace advice given to you by your health care provider. Make sure you discuss any questions you have with your health care provider.  Addiction in the Family Alcoholism and drug addiction takes a toll on the family. Many families have at least one parent who needs treatment for alcohol or drug dependency. Many children are exposed to illegal drug use in these families. Children of addiction (or COA's) are at great risk for:  Mental illness or emotional problems, such as:  Depression.  Anxiety.  Physical health problems.  Learning problems.  Children whose parents abuse alcohol or drugs are almost three times more likely to be abused. The abuse can be verbal, physical, or sexual. They are 4 times more likely than other children to be neglected. Strong scientific evidence suggests that addiction runs in families. Children of alcoholics are 4 times more likely than non-COA's to develop alcoholism or other drug problems. RESEARCH Research shows that many children with drug or alcohol dependent parents can benefit from adults who help and encourage them. Those children may cope well with the trauma of growing up in families affected by addiction. They often say that success is due to the support of:  A non-alcoholic parent.  Relative.  Teachers Other trusted adult in their lives that can help include:  Doctors.  Teachers.  Guidance counselors.  Theatre manager.  Social workers.  Consulting civil engineer.  Faith/spirituality Multimedia programmer. UNDERSTANDING CHILDREN AND ADDICTION Children in alcohol or drug dependent homes often live with denial, shame, and silence about their family problems. The  chaos in the family may lead to a painful, unsafe setting. COA's often take on the parent's responsibilities. For many, this results in a loss of childhood. Without help these children grow into adults (Adult Children of Alcoholics or ACOA's) who spend much of their time trying to make up for hidden fears, shame and denied anger from childhood. Without treatment, these adults experience ongoing problem with interpersonal relationships and daily functioning.  Some COA's display negative behaviors that may warn adults of a problem at home. Others work hard to succeed and please in spite of the stress at home. The unstable home life may have a negative impact on their future. Children of alcoholics are more likely to have substance abuse problems. WHEN PARENTS RECEIVE TREATMENT Living with an active alcohol or drug dependent adult is hard for the whole family. Even having a parent go through treatment can be painful for children. This change can confuse children. When a parent receives treatment, each family member should also receive appropriate services. That way all members of the family can recover from the impact of addiction. HOW YOU CAN HELP Adults can support COA's in these ways.   Provide children with information about addiction. Tell them:  Alcohol/drug dependency is an illness. You did not cause it. You cannot fix it.  Take care of yourself by talking with a trusted person.  Make good choices in your own life.  Addiction treatment can help your parent get well.  You are not alone. You need and deserve help. There are safe people and places that can help you.  Teach children how to share their feelings in healthy ways. One way is by speaking with "safe" adults. Guide them to support programs at school or in your community. Such programs can help them develop coping skills.  Take the time to develop a healthy relationship with a COA who needs you. Children who live in addicted families  learn not to trust adults. Listening to and supporting these children can change much of that mistrust. You can have a positive impact on a child's life.  If you can, guide the adults in the family to a qualified professional. He/she can help them get the treatment they need to begin recovery. One way to begin recovery is through an intervention. Only a Herbalist should lead an intervention. WHERE YOU AND COA'S CAN TURN FOR HELP Many resources can help adults identify and support COA's. Learn about local support groups such as Alateen and Al-Anon. School programs can assist COA's, too. [employer] that can provide resource materials to caring adults as well as COA's. Just showing an interest in the child and offering support can make a difference in his/her life. For more information about how you can help children who live in alcohol or drug dependent families, please contact any one of the following organizations.  Organizations for [employer] and ACOA's [employer] for Children of Alcoholics (NACoA) 888-55-4COAS (938) 799-1645)  nacoa@nacoa .org www.nacoa.org  Al-Anon/Alateen  For Families and Friends of Alcoholics 888-4AL-ANON/717 186 5523  WSO@al -anon.org www.al-anon.org 800-ALCOHOL for information and referrals Adult Children Of Alcoholics World Engineer, mining.adultchildren.org  Resources for information on substance abuse and treatment Center for Substance Abuse Treatment (CSAT) Substance Abuse and Mental Health Services Administration [facility]) CSAT's [employer] 800-662-HELP (Toll-Free) 8323752242 (TDD) 949 812 1517 (Spanish) info@samhsa .gov SkateOasis.com.pt SAMHSA's National Clearinghouse for Alcohol and Drug Information (NCADI) (518) 696-5653 (646)796-9605 (TDD)  info@samhsa .gov SkateOasis.com.pt National Council on Alcoholism and Drug Dependence, Inc. (NCADD) 800-NCA-CALL  national@ncadd .org www.ncadd.org Office of [employer] (ONDCP) (253)496-1182  ondcp@ncjrs .org www.whitehousedrugpolicy.gov Daisy Floro: www.freevibe.com is an Occupational hygienist for youth 11-18 focusing on drug-specific information in an entertainment setting.  Adults can get drug-specific information and tips for children by visiting the multilingual website, www.theantidrug.com, designed by the [employer] to help adults keep kids healthy, safe and drug-free. Document Released: 06/15/2004 Document Revised: 01/02/2012 Document Reviewed: 12/30/2013 [facility] Patient Information 2015 [place], [place]. This information is not intended to replace advice given to you by your health care provider. Make sure you discuss any questions you have with your health care provider.

## 2014-11-21 NOTE — Progress Notes (Signed)
Subjective:    Patient ID: [name], female    DOB: 02/09/1956, 59 y.o.   MRN: [id number]  HPI  This is a 59 year old female with PMH anemia d/t blood loss, obesity and anxiety who is presenting for weight loss recommendation.  At last visit 2 months ago she presented with epistaxis that eventually required hospitalization and transfusion. At that visit she also expressed desire for weight loss and wanted to start on phentermine. Dr. Clelia CroftShaw prescribed 30 adipex with goal of 5 pound wt loss by next visit. Pt reports she did not start taking adipex until 2 weeks after last visit. She is not taking every day. She states it helps control appetite but she hasn't lost weight. She has been on adderall for 6 months for anxiety/focus, prescribed by her psychiatrist, Dr. Sandria ManlyLove. Been on adderall for 6 months. She is not doing anything for exercise, states she never really has. For diet she reports "I eat whatever". She has not any more nosebleeds and no congestion.  She reports for the past 4-5 months she has been drinking 6-7 beers every night. She states she has never drank like this before. It is causing problems at home - she lives with her husband of 31 years. He does not like her drinking so much. She states this all started because 5 months ago she took on a lover. He is 58 years old. She states her husband has ED and ED meds have been unsuccessful. Four years ago they started sleeping in different bedrooms. She states sex is very important to her and "he doesn't care". She is very stressed out about being caught with her lover. She states if he found out "her life would fall apart". They have never done marriage counseling. She has not done individual counseling. She reports alcoholism runs on the maternal side of her family. She has never had withdrawal symptoms from coming off alcohol. She wants to come off the alcohol - the amount that she can drink in a night scares her. She is starting to realize  that alcoholism is hereditary. She is not sleeping well.  Elevated BP: Pt has elevated BP today. She states she has never had elevated BP like this before. She takes hydrocodone BID for knee pain. She takes adderall daily. She take zyrtec-D every day for the past 10 years. She has not taken adipex today.   Review of Systems  Constitutional: Negative for fever and chills.  HENT: Negative for congestion and nosebleeds.   Eyes: Negative for visual disturbance.  Cardiovascular: Negative for chest pain and palpitations.  Gastrointestinal: Negative for abdominal pain.  Musculoskeletal: Positive for arthralgias.  Psychiatric/Behavioral: Positive for sleep disturbance. The patient is nervous/anxious.     Patient Active Problem List   Diagnosis Date Noted  . Acute blood loss anemia 09/25/2014  . Epistaxis 09/24/2014   Prior to Admission medications   Medication Sig Start Date End Date Taking? Authorizing Provider  amphetamine-dextroamphetamine (ADDERALL) 20 MG tablet Take 20 mg by mouth 2 (two) times daily.   Yes Historical Provider, MD  cetirizine-pseudoephedrine (ZYRTEC-D) 5-120 MG per tablet Take 1 tablet by mouth 2 (two) times daily.   Yes Historical Provider, MD  DULoxetine (CYMBALTA) 60 MG capsule Take 60 mg by mouth 2 (two) times daily.   Yes Historical Provider, MD  ferrous sulfate 325 (65 FE) MG tablet Take 1 tablet (325 mg total) by mouth 2 (two) times daily with a meal. 09/26/14  Yes Penny Piarlando Vega,  MD  FLUoxetine (PROZAC) 20 MG capsule Take 20 mg by mouth 2 (two) times daily.    Yes Historical Provider, MD  HYDROcodone-acetaminophen (NORCO) 10-325 MG per tablet Take 1 tablet by mouth every 6 (six) hours as needed for moderate pain.  08/26/14  Yes Historical Provider, MD  Potassium Gluconate 550 MG TABS Take 550 mg by mouth daily.   Yes Historical Provider, MD  Pyridoxine HCl (B-6 PO) Take by mouth.   Yes Historical Provider, MD   No Known Allergies  Patient's social and family history  were reviewed.     Objective:   Physical Exam  Constitutional: She is oriented to person, place, and time. She appears well-developed and well-nourished. No distress.  HENT:  Head: Normocephalic and atraumatic.  Right Ear: Hearing normal.  Left Ear: Hearing normal.  Nose: Nose normal.  Eyes: Conjunctivae and lids are normal. Right eye exhibits no discharge. Left eye exhibits no discharge. No scleral icterus.  Cardiovascular: Normal rate, regular rhythm, normal heart sounds, intact distal pulses and normal pulses.   No murmur heard. Pulmonary/Chest: Effort normal and breath sounds normal. No respiratory distress. She has no wheezes. She has no rhonchi. She has no rales.  Musculoskeletal: Normal range of motion.  Neurological: She is alert and oriented to person, place, and time.  Skin: Skin is warm, dry and intact. No lesion and no rash noted.  Psychiatric: Her speech is normal and behavior is normal. Thought content normal.  Often tearful   BP 160/97 mmHg  Pulse 85  Temp(Src) 98.1 F (36.7 C) (Oral)  Resp 20  Ht 5' 5.5" (1.664 m)  Wt 265 lb 9.6 oz (120.475 kg)  BMI 43.51 kg/m2  SpO2 97%     Assessment & Plan:  1. Anemia, unspecified anemia type No further nosebleeds. Has been taking iron supplement. Will recheck hgb and ferritin. - CBC - Ferritin  2. Elevated blood pressure 3. Alcohol abuse 4. Anxiety state 5. BMI 40.0-44.9 adult Pt's BP is likely elevated d/t multiple BP elevating meds (adderall, adipex, zyrtec-D) and her emotional state. Prescribed librium to take at night instead of drinking. Pt is on board with this plan. She wants to quit. Advised that she try AA meetings to see if they are helpful to her. Advised she see a Veterinary surgeon. She will stop the adipex as it has not helped and is likely elevating her blood pressure. Eventually she should come off the zyrtec-D. She will follow up next week or sooner if needed.  - Comprehensive metabolic panel   [place].  Dyke Brackett, MHS [facility]  11/21/2014

## 2014-11-22 LAB — FERRITIN: Ferritin: 14 ng/mL (ref 10–291)

## 2014-11-28 ENCOUNTER — Telehealth: Payer: Self-pay | Admitting: Physician Assistant

## 2014-11-28 NOTE — Telephone Encounter (Signed)
Called pt and left VM to call back.

## 2014-12-02 NOTE — Telephone Encounter (Signed)
Called pt again and left VM to call back. Also let her know she is still fairly anemic and informed that she needed to be taking her iron supplement BID and needs to return for further blood work.

## 2014-12-03 NOTE — Progress Notes (Signed)
Pt assessed, reviewed documentation and agree w/ assessment and plan. Deziya Amero, MD MPH   

## 2015-01-07 ENCOUNTER — Encounter: Payer: Self-pay | Admitting: Family Medicine

## 2015-02-16 ENCOUNTER — Ambulatory Visit (INDEPENDENT_AMBULATORY_CARE_PROVIDER_SITE_OTHER): Payer: BC Managed Care – PPO | Admitting: Family Medicine

## 2015-02-16 VITALS — BP 142/88 | HR 86 | Temp 98.3°F | Resp 16 | Ht 66.0 in | Wt 266.0 lb

## 2015-02-16 DIAGNOSIS — R300 Dysuria: Secondary | ICD-10-CM

## 2015-02-16 DIAGNOSIS — D849 Immunodeficiency, unspecified: Secondary | ICD-10-CM

## 2015-02-16 DIAGNOSIS — D899 Disorder involving the immune mechanism, unspecified: Secondary | ICD-10-CM | POA: Diagnosis not present

## 2015-02-16 DIAGNOSIS — A5901 Trichomonal vulvovaginitis: Secondary | ICD-10-CM | POA: Diagnosis not present

## 2015-02-16 DIAGNOSIS — N898 Other specified noninflammatory disorders of vagina: Secondary | ICD-10-CM

## 2015-02-16 LAB — POCT UA - MICROSCOPIC ONLY
CASTS, UR, LPF, POC: NEGATIVE
CRYSTALS, UR, HPF, POC: NEGATIVE
MUCUS UA: NEGATIVE
Trichomonas, UA: POSITIVE
Yeast, UA: NEGATIVE

## 2015-02-16 LAB — POCT URINALYSIS DIPSTICK
Bilirubin, UA: NEGATIVE
Glucose, UA: NEGATIVE
KETONES UA: NEGATIVE
NITRITE UA: NEGATIVE
PH UA: 6
Protein, UA: 100
Spec Grav, UA: 1.015
Urobilinogen, UA: 0.2

## 2015-02-16 LAB — POCT WET PREP WITH KOH
KOH Prep POC: NEGATIVE
Trichomonas, UA: POSITIVE
YEAST WET PREP PER HPF POC: NEGATIVE

## 2015-02-16 MED ORDER — METRONIDAZOLE 500 MG PO TABS: 500.0000 mg | ORAL_TABLET | Freq: Two times a day (BID) | ORAL | Status: DC

## 2015-02-16 NOTE — Patient Instructions (Signed)

## 2015-02-16 NOTE — Progress Notes (Addendum)
Subjective:    Patient ID: [name], female    DOB: 05-Oct-1956, 59 y.o.   MRN: [id number] This chart was scribed for Sherren MochaEva N Luisenrique Conran, MD by [place] Peace, ED Scribe. The patient was seen in [facility]02. The patient's care was started at 6:39 PM.  Chief Complaint  Patient presents with  . Vaginal Discharge    x 3 days     Vaginal Discharge The patient's primary symptoms include vaginal discharge. The patient's pertinent negatives include no pelvic pain. Associated symptoms include dysuria. Pertinent negatives include no abdominal pain, chills, constipation, diarrhea, fever, frequency, hematuria, nausea, rash, urgency or vomiting.    HPI Comments: Sydney HoffFonda T Pledger is a 59 y.o. female who [facility] complaining of vaginal discharge for several wks - thinks she has a yeast infection. No itching but is having some mild vaginal discomfort.  No vag bleeding. Normal bowels but is having some mild dysuria as well.  Pt has been under a lot of stress as her husband has to many medical comorbities which has caused severe ED and unable to use prn diphospherase inhibitors as well as husband has told her he has no libido and so declared that there would not be any physical/sexual component to their marriage going forward without every really discussing what her needs are - he told her that she should get a boyfriend for this which she did but has not told husband since she is sure he was joking.  Pt has been sexually involved at a 58 yo boyfriend for over a year. Do not use condoms. At her last visit she decided to stop the affair as the large amount of stress is clearly taking its toll.  However, that did not happen and has plans to see him tonight since her husband is out of town - wants to get her vaginal discharge treated prior.  Having a ton of guilt about this which is making her very stressed/anxious so has been self-medicating with etoh to where pt was drinking 6-7 beers/night for the past 6 months.  Husband is really pressuring her to cut down/out and at her last visit 3 months ago.  Bush-PA and I encouraged pt to stop drinking all alcohol and check out AA which pt was open to at that time.  Unfortunately she states that she has cut down but not out and is having a lot of guilt  - worrying that she let me down as well.      Filed Vitals:   02/16/15 1817  BP: 158/98  Pulse: 86  Temp: 98.3 F (36.8 C)  Resp: 16   Past Medical History  Diagnosis Date  . Depression   . Arthritis   . Seasonal allergies   . Anemia   . Obese   . Sleep apnea 2012    took test in [place]-told to use cpap-could not ude   No Known Allergies Current Outpatient Prescriptions on File Prior to Visit  Medication Sig Dispense Refill  . amphetamine-dextroamphetamine (ADDERALL) 20 MG tablet Take 20 mg by mouth 2 (two) times daily.    . DULoxetine (CYMBALTA) 60 MG capsule Take 60 mg by mouth 2 (two) times daily.    . ferrous sulfate 325 (65 FE) MG tablet Take 1 tablet (325 mg total) by mouth 2 (two) times daily with a meal. 60 tablet 0  . FLUoxetine (PROZAC) 20 MG capsule Take 20 mg by mouth 2 (two) times daily.     Marland Kitchen. HYDROcodone-acetaminophen (  NORCO) 10-325 MG per tablet Take 1 tablet by mouth every 6 (six) hours as needed for moderate pain.   0  . Potassium Gluconate 550 MG TABS Take 550 mg by mouth daily.    . Pyridoxine HCl (B-6 PO) Take by mouth.     No current facility-administered medications on file prior to visit.     Review of Systems  Constitutional: Positive for fatigue. Negative for fever, chills, diaphoresis, activity change, appetite change and unexpected weight change.  Gastrointestinal: Negative for nausea, vomiting, abdominal pain, diarrhea, constipation, blood in stool, abdominal distention, anal bleeding and rectal pain.  Genitourinary: Positive for dysuria and vaginal discharge. Negative for urgency, frequency, hematuria, decreased urine volume, vaginal bleeding, difficulty urinating,  genital sores, vaginal pain, menstrual problem, pelvic pain and dyspareunia.  Musculoskeletal: Negative for gait problem.  Skin: Negative for rash.  Neurological: Negative for tremors and syncope.  Hematological: Negative for adenopathy.  Psychiatric/Behavioral: Positive for sleep disturbance, dysphoric mood and agitation. The patient is nervous/anxious.        Objective:   Physical Exam  Constitutional: She is oriented to person, place, and time. She appears well-developed and well-nourished. No distress.  HENT:  Head: Normocephalic and atraumatic.  Abdominal: Soft. Bowel sounds are normal. She exhibits no distension. There is no tenderness. There is no rebound and no guarding.  Genitourinary: Uterus normal. Pelvic exam was performed with patient supine. There is no rash, tenderness or lesion on the right labia. There is no rash, tenderness or lesion on the left labia. Cervix exhibits no motion tenderness and no friability. Right adnexum displays no mass, no tenderness and no fullness. Left adnexum displays no mass, no tenderness and no fullness. There is erythema in the vagina. No tenderness in the vagina. Vaginal discharge found.  Copious yellow green thin discharge Mild erythema of vaginal introitus  Lymphadenopathy:       Right: No inguinal adenopathy present.       Left: No inguinal adenopathy present.  Neurological: She is alert and oriented to person, place, and time.  Skin: Skin is warm and dry. She is not diaphoretic.  Psychiatric: She has a normal mood and affect. Her behavior is normal.    Results for orders placed or performed in visit on 02/16/15  GC/Chlamydia Probe Amp  Result Value Ref Range   CT Probe RNA NEGATIVE    GC Probe RNA NEGATIVE   Urine culture  Result Value Ref Range   Colony Count 30,000 COLONIES/ML    Organism ID, Bacteria Multiple bacterial morphotypes present, none    Organism ID, Bacteria predominant. Suggest appropriate recollection if      Organism ID, Bacteria clinically indicated.   POCT UA - Microscopic Only  Result Value Ref Range   WBC, Ur, HPF, POC TNTC    RBC, urine, microscopic 8-12    Bacteria, U Microscopic 1+    Mucus, UA neg    Epithelial cells, urine per micros 3-6    Crystals, Ur, HPF, POC neg    Casts, Ur, LPF, POC neg    Yeast, UA neg    Trichomonas, UA Positive   POCT urinalysis dipstick  Result Value Ref Range   Color, UA yellow    Clarity, UA cloudy    Glucose, UA neg    Bilirubin, UA neg    Ketones, UA neg    Spec Grav, UA 1.015    Blood, UA moderate    pH, UA 6.0    Protein, UA 100  Urobilinogen, UA 0.2    Nitrite, UA neg    Leukocytes, UA large (3+)   POCT Wet Prep with KOH  Result Value Ref Range   Trichomonas, UA Positive    Clue Cells Wet Prep HPF POC 0-2    Epithelial Wet Prep HPF POC 2-6    Yeast Wet Prep HPF POC neg    Bacteria Wet Prep HPF POC 2+    RBC Wet Prep HPF POC 2-3    WBC Wet Prep HPF POC 35-40    KOH Prep POC Negative      6:39 PM- Treatment plan was discussed with patient who verbalizes understanding and agrees.      Assessment & Plan:   1. Vaginal discharge - due to trichomonas - boyfriend needs treatment as well before resuming sexual activity. Rec RTC in 2-4 wks for TOC.  2. Immunosuppressed status - on Humira  3. Dysuria   Pt needs to stop all EtOH and start therapy. NO EtOH while on flagyl   Orders Placed This Encounter  Procedures  . GC/Chlamydia Probe Amp  . Urine culture  . POCT UA - Microscopic Only  . POCT urinalysis dipstick  . POCT Wet Prep with KOH    Meds ordered this encounter  Medications  . metroNIDAZOLE (FLAGYL) 500 MG tablet    Sig: Take 1 tablet (500 mg total) by mouth 2 (two) times daily.    Dispense:  14 tablet    Refill:  0    I personally performed the services described in this documentation, which was scribed in my presence. The recorded information has been reviewed and considered, and addended by me as needed.  Norberto Sorenson, MD MPH

## 2015-02-17 ENCOUNTER — Telehealth: Payer: Self-pay

## 2015-02-17 NOTE — Telephone Encounter (Signed)
Pt states she saw Dr.Shaw yesterday and forgot to discuss something else with her, didn't want to go into detail with me Please call [phone]

## 2015-02-17 NOTE — Telephone Encounter (Signed)
Is it medically necessary though? We do need to ensure we have documentaiton/proof to support this.  What are her symptoms? Where is she being seen? Who recommended that she have her veins fixed, whiched ones, how are they going to do this? Pt should really follow up in 2-4 wks for repeat pelvic exam - ensure she responded to treatment and that infection has resolved. THen we could do this letter at that time since there is clearly a lot of information we need in order to declare that this is medically necessary.

## 2015-02-17 NOTE — Telephone Encounter (Signed)
Pt needs a note from you saying it is medically necessary for her to have her veins fixed. She forgot to mention this to you yesterday.  Her appt is in about 6 weeks.

## 2015-02-18 LAB — URINE CULTURE: Colony Count: 30000

## 2015-02-18 LAB — GC/CHLAMYDIA PROBE AMP
CT PROBE, AMP APTIMA: NEGATIVE
GC Probe RNA: NEGATIVE

## 2015-02-18 NOTE — Telephone Encounter (Signed)
Perfect - thanks for the info Rebekah - super helpful - will write letter this wk and call pt when ready

## 2015-02-18 NOTE — Telephone Encounter (Signed)
Her veins are bulging, legs ache. She had some fixed 10-12 years ago and now they've come back. She is going to [facility]. She is not sure of procedure this time, last time they did saline injections. She's already had ultrasound on both legs.  Her pelvic infection has gotten better, medicine has helped. Pt will come in to be seen. Pt will return in 2-4 weeks.

## 2015-03-03 ENCOUNTER — Encounter: Payer: Self-pay | Admitting: Family Medicine

## 2015-03-03 NOTE — Telephone Encounter (Signed)
Letter written.  It is short but hopefully sufficient.  Printer was broken - please print out from letter tab. Thanks.

## 2015-03-03 NOTE — Telephone Encounter (Signed)
Patient calling to check on status of letter?  [phone]

## 2015-03-05 DIAGNOSIS — F423 Hoarding disorder: Secondary | ICD-10-CM | POA: Insufficient documentation

## 2015-03-05 NOTE — Telephone Encounter (Signed)
Letter in pick up draw. 

## 2015-03-05 NOTE — Telephone Encounter (Signed)
Left message letter ready to be picked up.

## 2015-04-03 ENCOUNTER — Encounter: Payer: Self-pay | Admitting: *Deleted

## 2015-04-11 ENCOUNTER — Telehealth: Payer: Self-pay

## 2015-04-11 ENCOUNTER — Other Ambulatory Visit: Payer: Self-pay | Admitting: Family Medicine

## 2015-04-11 NOTE — Telephone Encounter (Signed)
Pt is asking questions about the status of her refill of medication she has requested

## 2015-04-11 NOTE — Telephone Encounter (Signed)
Awaiting response from Dr. Clelia Croft.

## 2015-04-14 ENCOUNTER — Ambulatory Visit (INDEPENDENT_AMBULATORY_CARE_PROVIDER_SITE_OTHER): Payer: BC Managed Care – PPO | Admitting: Physician Assistant

## 2015-04-14 ENCOUNTER — Telehealth: Payer: Self-pay

## 2015-04-14 VITALS — BP 160/98 | HR 94 | Temp 99.2°F | Resp 16 | Ht 66.0 in | Wt 271.0 lb

## 2015-04-14 DIAGNOSIS — I10 Essential (primary) hypertension: Secondary | ICD-10-CM

## 2015-04-14 DIAGNOSIS — R300 Dysuria: Secondary | ICD-10-CM | POA: Diagnosis not present

## 2015-04-14 DIAGNOSIS — N898 Other specified noninflammatory disorders of vagina: Secondary | ICD-10-CM

## 2015-04-14 DIAGNOSIS — F1010 Alcohol abuse, uncomplicated: Secondary | ICD-10-CM

## 2015-04-14 LAB — POCT URINALYSIS DIPSTICK
BILIRUBIN UA: NEGATIVE
Blood, UA: NEGATIVE
GLUCOSE UA: NEGATIVE
Ketones, UA: NEGATIVE
NITRITE UA: NEGATIVE
Protein, UA: NEGATIVE
Spec Grav, UA: <=1.005
Urobilinogen, UA: 0.2
pH, UA: 5.5

## 2015-04-14 LAB — POCT UA - MICROSCOPIC ONLY
CASTS, UR, LPF, POC: NEGATIVE
CRYSTALS, UR, HPF, POC: NEGATIVE
MUCUS UA: NEGATIVE
Yeast, UA: NEGATIVE

## 2015-04-14 LAB — POCT WET PREP WITH KOH
CLUE CELLS WET PREP PER HPF POC: NEGATIVE
KOH PREP POC: NEGATIVE
RBC Wet Prep HPF POC: NEGATIVE
Trichomonas, UA: NEGATIVE
Yeast Wet Prep HPF POC: NEGATIVE

## 2015-04-14 MED ORDER — HYDROCHLOROTHIAZIDE 12.5 MG PO TABS: 12.5000 mg | ORAL_TABLET | Freq: Every day | ORAL | Status: DC

## 2015-04-14 MED ORDER — CEPHALEXIN 500 MG PO CAPS: 500.0000 mg | ORAL_CAPSULE | Freq: Two times a day (BID) | ORAL | Status: AC

## 2015-04-14 NOTE — Telephone Encounter (Signed)
Pt needs office visit. Left a message letting pt know.

## 2015-04-14 NOTE — Progress Notes (Signed)
[facility] [address] [place] [address] [phone]  Date:  04/14/2015   Name:  [name]   DOB:  April 07, 1956   MRN:  [id number]  PCP:  Norberto Sorenson, MD    History of Present Illness:  [name] is a 59 y.o. female patient who presents to [facility] for chief complaint of abnormal itching and burning in her vaginal area for the last 5-6 days.  She has no burning sensation with urination, frequency, or hematuria.  She denies abnormal bleeding. She denies rash.  She denies nausea, fever, or abdominal pain.  Patient had recent hx of trichomonal vaginitis 2 months ago.  She states that she has not had any sexual engagement with this individual.       Patient Active Problem List   Diagnosis Date Noted  . Alcohol abuse 11/21/2014  . Anxiety state 11/21/2014  . BMI 40.0-44.9, adult 11/21/2014  . Acute blood loss anemia 09/25/2014  . Epistaxis 09/24/2014    Past Medical History  Diagnosis Date  . Depression   . Arthritis   . Seasonal allergies   . Anemia   . Obese   . Sleep apnea 2012    took test in [place]-told to use cpap-could not ude    Past Surgical History  Procedure Laterality Date  . Total knee revision  07/30/2010    right; with lateral release  . Anterior cervical decomp/discectomy fusion  05/09/2008    C5-6, C6-7; with arthrodesis also  . Rotator cuff repair w/ distal clavicle excision  01/06/2006    right  . 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 Total knee arthroplasty  11/07/2003    right  . Shoulder arthroscopy  2003    rt  . Knee arthroscopy  2004    rt and lt knee  . Shoulder arthroscopy  2003    rt  . Tonsillectomy    . Gastric bypass  2002    History  Substance Use Topics  . Smoking status: Former Smoker    Quit date: 05/16/1981  . Smokeless tobacco: Not on file  . Alcohol Use: 3.0 oz/week    5 Cans of beer per week    Family History  Problem Relation Age of Onset  . Heart disease Mother   . Cancer Father      No Known Allergies  Medication list has been reviewed and updated.  Current Outpatient Prescriptions on File Prior to Visit  Medication Sig Dispense Refill  . amphetamine-dextroamphetamine (ADDERALL) 20 MG tablet Take 20 mg by mouth 2 (two) times daily.    . DULoxetine (CYMBALTA) 60 MG capsule Take 60 mg by mouth 2 (two) times daily.    Marland Kitchen FLUoxetine (PROZAC) 20 MG capsule Take 20 mg by mouth 2 (two) times daily.     Marland Kitchen HYDROcodone-acetaminophen (NORCO) 10-325 MG per tablet Take 1 tablet by mouth every 6 (six) hours as needed for moderate pain.   0  . Potassium Gluconate 550 MG TABS Take 550 mg by mouth daily.    . Pyridoxine HCl (B-6 PO) Take by mouth.    . ferrous sulfate 325 (65 FE) MG tablet Take 1 tablet (325 mg total) by mouth 2 (two) times daily with a meal. (Patient not taking: Reported on 04/14/2015) 60 tablet 0  . metroNIDAZOLE (FLAGYL) 500 MG tablet Take 1 tablet (500 mg total) by mouth 2 (two) times daily. (Patient not taking: Reported on 04/14/2015) 14 tablet 0  No current facility-administered medications on file prior to visit.    ROS ROS otherwise unremarkable unless listed above.    Physical Examination: BP 160/98 mmHg  Pulse 94  Temp(Src) 99.2 F (37.3 C) (Oral)  Resp 16  Ht  (1.676 m)  Wt 271 lb (122.925 kg)  BMI 43.76 kg/m2  SpO2 98% Ideal Body Weight: Weight in (lb) to have BMI = 25: 154.6  Physical Exam  Constitutional: She is oriented to person, place, and time. She appears well-developed and well-nourished. No distress.  HENT:  Head: Normocephalic and atraumatic.  Eyes: Pupils are equal, round, and reactive to light.  Cardiovascular: Normal rate, regular rhythm and normal heart sounds.  Exam reveals no friction rub.   No murmur heard. Pulmonary/Chest: Effort normal and breath sounds normal. No respiratory distress. She has no wheezes.  Genitourinary: Pelvic exam was performed with patient supine.  Mild erythematous rash along the labia majora.   Vaginal wall at the 9'oclock region has a bleeding tissue.  No growth can be visualized at this location.  There is no lesion present in this location.  Copious yellow discharge circulates throughout the vaginal area.  There is no bleeding of the cervix.   Bimanual with mild tenderness at the vaginal wall consistent with bleeding area.    Neurological: She is alert and oriented to person, place, and time.  Skin: Skin is warm and dry.  Psychiatric: She has a normal mood and affect. Her behavior is normal.   Results for orders placed or performed in visit on 04/14/15  POCT UA - Microscopic Only  Result Value Ref Range   WBC, Ur, HPF, POC 0-2    RBC, urine, microscopic 0-2    Bacteria, U Microscopic trace    Mucus, UA neg    Epithelial cells, urine per micros 1-2    Crystals, Ur, HPF, POC neg    Casts, Ur, LPF, POC neg    Yeast, UA neg    Renal tubular cells    POCT urinalysis dipstick  Result Value Ref Range   Color, UA light yellow    Clarity, UA clear    Glucose, UA neg    Bilirubin, UA neg    Ketones, UA neg    Spec Grav, UA <=1.005    Blood, UA neg    pH, UA 5.5    Protein, UA neg    Urobilinogen, UA 0.2    Nitrite, UA neg    Leukocytes, UA small (1+) (A) Negative  POCT Wet Prep with KOH  Result Value Ref Range   Trichomonas, UA Negative    Clue Cells Wet Prep HPF POC neg    Epithelial Wet Prep HPF POC Few Few, Moderate, Many   Yeast Wet Prep HPF POC neg    Bacteria Wet Prep HPF POC Few Few   RBC Wet Prep HPF POC neg    WBC Wet Prep HPF POC moderate    KOH Prep POC Negative      Assessment and Plan: 59 year old female is here today for chief complaint of abnormal itching and vaginal discharge for 5 days.  Urine appears to be more prominent of infeciton.  Cultures placed.  Will place on keflex for 5 days.  Will have urine culture, and advised to return to clinic if symptoms continue.   1. Dysuria - POCT UA - Microscopic Only - POCT urinalysis dipstick - Urine  culture - cephALEXin (KEFLEX) 500 MG capsule; Take 1 capsule (500 mg total)  by mouth 2 (two) times daily.  Dispense: 10 capsule; Refill: 0  2. Vaginal discharge - POCT Wet Prep with KOH - GC/Chlamydia Probe Amp  3. Vaginal lesion - Ambulatory referral to Gynecology  4. Essential hypertension -Advising alcohol decrease, and cessation.  Patient would like to decrease her alcohol intake, prior to starting an anti-hypertensive, which is the likely culprit to her symptoms.  She will check her BP at home for 2 weeks, and if she continues to have >140/90, then she will return for anti-hypertensive.       5. Alcohol abuse   Trena Platt, PA-C [facility] 04/14/2015 4:59 PM

## 2015-04-14 NOTE — Telephone Encounter (Signed)
Pt has a UTI that keeps flaring up. She would like a script for ciprofloxacin (CIPRO) 500 MG tablet. I see this request was denied on 6/18. Please advise at [phone]

## 2015-04-14 NOTE — Patient Instructions (Addendum)
We will treat you for the leukocytes in the urine.  I am placing you on keflex for 5 days.  I will contact you sooner, about the urine culture.  We will refer you to ob gyn for a pap smear and to monitor the lesion, so await this phone call for appointment.   check your blood pressure at home, and if it is over 140/90, you need to begin the anti-hypertensive medication and follow up.    DASH Eating Plan DASH stands for "Dietary Approaches to Stop Hypertension." The DASH eating plan is a healthy eating plan that has been shown to reduce high blood pressure (hypertension). Additional health benefits may include reducing the risk of type 2 diabetes mellitus, heart disease, and stroke. The DASH eating plan may also help with weight loss. WHAT DO I NEED TO KNOW ABOUT THE DASH EATING PLAN? For the DASH eating plan, you will follow these general guidelines:  Choose foods with a percent daily value for sodium of less than 5% (as listed on the food label).  Use salt-free seasonings or herbs instead of table salt or sea salt.  Check with your health care provider or pharmacist before using salt substitutes.  Eat lower-sodium products, often labeled as "lower sodium" or "no salt added."  Eat fresh foods.  Eat more vegetables, fruits, and low-fat dairy products.  Choose whole grains. Look for the word "whole" as the first word in the ingredient list.  Choose fish and skinless chicken or [place] more often than red meat. Limit fish, poultry, and meat to 6 oz (170 g) each day.  Limit sweets, desserts, sugars, and sugary drinks.  Choose heart-healthy fats.  Limit cheese to 1 oz (28 g) per day.  Eat more home-cooked food and less restaurant, buffet, and fast food.  Limit fried foods.  Cook foods using methods other than frying.  Limit canned vegetables. If you do use them, rinse them well to decrease the sodium.  When eating at a restaurant, ask that your food be prepared with less salt, or no  salt if possible. WHAT FOODS CAN I EAT? Seek help from a dietitian for individual calorie needs. Grains Whole grain or whole wheat bread. Brown rice. Whole grain or whole wheat pasta. Quinoa, bulgur, and whole grain cereals. Low-sodium cereals. Corn or whole wheat flour tortillas. Whole grain cornbread. Whole grain crackers. Low-sodium crackers. Vegetables Fresh or frozen vegetables (raw, steamed, roasted, or grilled). Low-sodium or reduced-sodium tomato and vegetable juices. Low-sodium or reduced-sodium tomato sauce and paste. Low-sodium or reduced-sodium canned vegetables.  Fruits All fresh, canned (in natural juice), or frozen fruits. Meat and Other Protein Products Ground beef (85% or leaner), grass-fed beef, or beef trimmed of fat. Skinless chicken or [place]. Ground chicken or [place]. Pork trimmed of fat. All fish and seafood. Eggs. Dried beans, peas, or lentils. Unsalted nuts and seeds. Unsalted canned beans. Dairy Low-fat dairy products, such as skim or 1% milk, 2% or reduced-fat cheeses, low-fat ricotta or cottage cheese, or plain low-fat yogurt. Low-sodium or reduced-sodium cheeses. Fats and Oils Tub margarines without trans fats. Light or reduced-fat mayonnaise and salad dressings (reduced sodium). Avocado. Safflower, olive, or canola oils. Natural peanut or almond butter. Other Unsalted popcorn and pretzels. The items listed above may not be a complete list of recommended foods or beverages. Contact your dietitian for more options. WHAT FOODS ARE NOT RECOMMENDED? Grains White bread. White pasta. White rice. Refined cornbread. Bagels and croissants. Crackers that contain trans fat. Vegetables Creamed or  fried vegetables. Vegetables in a cheese sauce. Regular canned vegetables. Regular canned tomato sauce and paste. Regular tomato and vegetable juices. Fruits Dried fruits. Canned fruit in light or heavy syrup. Fruit juice. Meat and Other Protein Products Fatty cuts of meat. Ribs,  chicken wings, bacon, sausage, bologna, salami, chitterlings, fatback, hot dogs, bratwurst, and packaged luncheon meats. Salted nuts and seeds. Canned beans with salt. Dairy Whole or 2% milk, cream, half-and-half, and cream cheese. Whole-fat or sweetened yogurt. Full-fat cheeses or blue cheese. Nondairy creamers and whipped toppings. Processed cheese, cheese spreads, or cheese curds. Condiments Onion and garlic salt, seasoned salt, table salt, and sea salt. Canned and packaged gravies. Worcestershire sauce. Tartar sauce. Barbecue sauce. Teriyaki sauce. Soy sauce, including reduced sodium. Steak sauce. Fish sauce. Oyster sauce. Cocktail sauce. Horseradish. Ketchup and mustard. Meat flavorings and tenderizers. Bouillon cubes. Hot sauce. Tabasco sauce. Marinades. Taco seasonings. Relishes. Fats and Oils Butter, stick margarine, lard, shortening, ghee, and bacon fat. Coconut, palm kernel, or palm oils. Regular salad dressings. Other Pickles and olives. Salted popcorn and pretzels. The items listed above may not be a complete list of foods and beverages to avoid. Contact your dietitian for more information. WHERE CAN I FIND MORE INFORMATION? National Heart, Lung, and Blood Institute: [employer] Document Released: 09/29/2011 Document Revised: 02/24/2014 Document Reviewed: 08/14/2013 [facility] Patient Information 2015 [place], [place]. This information is not intended to replace advice given to you by your health care provider. Make sure you discuss any questions you have with your health care provider. DASH Eating Plan DASH stands for "Dietary Approaches to Stop Hypertension." The DASH eating plan is a healthy eating plan that has been shown to reduce high blood pressure (hypertension). Additional health benefits may include reducing the risk of type 2 diabetes mellitus, heart disease, and stroke. The DASH eating plan may also help with weight loss. WHAT DO I NEED TO  KNOW ABOUT THE DASH EATING PLAN? For the DASH eating plan, you will follow these general guidelines:  Choose foods with a percent daily value for sodium of less than 5% (as listed on the food label).  Use salt-free seasonings or herbs instead of table salt or sea salt.  Check with your health care provider or pharmacist before using salt substitutes.  Eat lower-sodium products, often labeled as "lower sodium" or "no salt added."  Eat fresh foods.  Eat more vegetables, fruits, and low-fat dairy products.  Choose whole grains. Look for the word "whole" as the first word in the ingredient list.  Choose fish and skinless chicken or [place] more often than red meat. Limit fish, poultry, and meat to 6 oz (170 g) each day.  Limit sweets, desserts, sugars, and sugary drinks.  Choose heart-healthy fats.  Limit cheese to 1 oz (28 g) per day.  Eat more home-cooked food and less restaurant, buffet, and fast food.  Limit fried foods.  Cook foods using methods other than frying.  Limit canned vegetables. If you do use them, rinse them well to decrease the sodium.  When eating at a restaurant, ask that your food be prepared with less salt, or no salt if possible. WHAT FOODS CAN I EAT? Seek help from a dietitian for individual calorie needs. Grains Whole grain or whole wheat bread. Brown rice. Whole grain or whole wheat pasta. Quinoa, bulgur, and whole grain cereals. Low-sodium cereals. Corn or whole wheat flour tortillas. Whole grain cornbread. Whole grain crackers. Low-sodium crackers. Vegetables Fresh or frozen vegetables (raw, steamed, roasted, or grilled). Low-sodium or  reduced-sodium tomato and vegetable juices. Low-sodium or reduced-sodium tomato sauce and paste. Low-sodium or reduced-sodium canned vegetables.  Fruits All fresh, canned (in natural juice), or frozen fruits. Meat and Other Protein Products Ground beef (85% or leaner), grass-fed beef, or beef trimmed of fat. Skinless  chicken or [place]. Ground chicken or [place]. Pork trimmed of fat. All fish and seafood. Eggs. Dried beans, peas, or lentils. Unsalted nuts and seeds. Unsalted canned beans. Dairy Low-fat dairy products, such as skim or 1% milk, 2% or reduced-fat cheeses, low-fat ricotta or cottage cheese, or plain low-fat yogurt. Low-sodium or reduced-sodium cheeses. Fats and Oils Tub margarines without trans fats. Light or reduced-fat mayonnaise and salad dressings (reduced sodium). Avocado. Safflower, olive, or canola oils. Natural peanut or almond butter. Other Unsalted popcorn and pretzels. The items listed above may not be a complete list of recommended foods or beverages. Contact your dietitian for more options. WHAT FOODS ARE NOT RECOMMENDED? Grains White bread. White pasta. White rice. Refined cornbread. Bagels and croissants. Crackers that contain trans fat. Vegetables Creamed or fried vegetables. Vegetables in a cheese sauce. Regular canned vegetables. Regular canned tomato sauce and paste. Regular tomato and vegetable juices. Fruits Dried fruits. Canned fruit in light or heavy syrup. Fruit juice. Meat and Other Protein Products Fatty cuts of meat. Ribs, chicken wings, bacon, sausage, bologna, salami, chitterlings, fatback, hot dogs, bratwurst, and packaged luncheon meats. Salted nuts and seeds. Canned beans with salt. Dairy Whole or 2% milk, cream, half-and-half, and cream cheese. Whole-fat or sweetened yogurt. Full-fat cheeses or blue cheese. Nondairy creamers and whipped toppings. Processed cheese, cheese spreads, or cheese curds. Condiments Onion and garlic salt, seasoned salt, table salt, and sea salt. Canned and packaged gravies. Worcestershire sauce. Tartar sauce. Barbecue sauce. Teriyaki sauce. Soy sauce, including reduced sodium. Steak sauce. Fish sauce. Oyster sauce. Cocktail sauce. Horseradish. Ketchup and mustard. Meat flavorings and tenderizers. Bouillon cubes. Hot sauce. Tabasco sauce.  Marinades. Taco seasonings. Relishes. Fats and Oils Butter, stick margarine, lard, shortening, ghee, and bacon fat. Coconut, palm kernel, or palm oils. Regular salad dressings. Other Pickles and olives. Salted popcorn and pretzels. The items listed above may not be a complete list of foods and beverages to avoid. Contact your dietitian for more information. WHERE CAN I FIND MORE INFORMATION? National Heart, Lung, and Blood Institute: [employer] Document Released: 09/29/2011 Document Revised: 02/24/2014 Document Reviewed: 08/14/2013 [facility] Patient Information 2015 [place], [place]. This information is not intended to replace advice given to you by your health care provider. Make sure you discuss any questions you have with your health care provider.

## 2015-04-15 ENCOUNTER — Telehealth: Payer: Self-pay

## 2015-04-15 LAB — GC/CHLAMYDIA PROBE AMP
CT PROBE, AMP APTIMA: NEGATIVE
GC Probe RNA: NEGATIVE

## 2015-04-15 MED ORDER — FLUCONAZOLE 150 MG PO TABS: 150.0000 mg | ORAL_TABLET | Freq: Once | ORAL | Status: DC

## 2015-04-15 NOTE — Telephone Encounter (Signed)
Pt was seen my stephanie english yesterday, she was given an antibiotic. When she gets antibiotics she gets yeast infections. Pt would like 4 Diflucan to prevent this.  Please advise [phone]

## 2015-04-15 NOTE — Telephone Encounter (Signed)
Called pt, left message letting pt know.

## 2015-04-16 LAB — URINE CULTURE: Colony Count: 10000

## 2015-04-17 ENCOUNTER — Other Ambulatory Visit: Payer: Self-pay | Admitting: Physician Assistant

## 2015-04-17 DIAGNOSIS — T3695XA Adverse effect of unspecified systemic antibiotic, initial encounter: Principal | ICD-10-CM

## 2015-04-17 DIAGNOSIS — B379 Candidiasis, unspecified: Secondary | ICD-10-CM

## 2015-04-17 MED ORDER — FLUCONAZOLE 150 MG PO TABS: 150.0000 mg | ORAL_TABLET | Freq: Once | ORAL | Status: DC

## 2015-04-22 ENCOUNTER — Encounter: Payer: Self-pay | Admitting: Physician Assistant

## 2015-04-23 ENCOUNTER — Ambulatory Visit (INDEPENDENT_AMBULATORY_CARE_PROVIDER_SITE_OTHER): Payer: BC Managed Care – PPO | Admitting: Women's Health

## 2015-04-23 ENCOUNTER — Encounter: Payer: Self-pay | Admitting: Women's Health

## 2015-04-23 ENCOUNTER — Other Ambulatory Visit (HOSPITAL_COMMUNITY)
Admission: RE | Admit: 2015-04-23 | Discharge: 2015-04-23 | Disposition: A | Discharge status: Home / Self Care | Payer: BC Managed Care – PPO | Source: Ambulatory Visit | Attending: Women's Health | Admitting: Women's Health

## 2015-04-23 VITALS — BP 136/80 | Ht 66.0 in | Wt 271.0 lb

## 2015-04-23 DIAGNOSIS — Z13820 Encounter for screening for osteoporosis: Secondary | ICD-10-CM

## 2015-04-23 DIAGNOSIS — Z1151 Encounter for screening for human papillomavirus (HPV): Secondary | ICD-10-CM | POA: Diagnosis present

## 2015-04-23 DIAGNOSIS — Z01419 Encounter for gynecological examination (general) (routine) without abnormal findings: Secondary | ICD-10-CM | POA: Insufficient documentation

## 2015-04-23 DIAGNOSIS — R87810 Cervical high risk human papillomavirus (HPV) DNA test positive: Secondary | ICD-10-CM | POA: Insufficient documentation

## 2015-04-23 DIAGNOSIS — N898 Other specified noninflammatory disorders of vagina: Secondary | ICD-10-CM

## 2015-04-23 LAB — WET PREP FOR TRICH, YEAST, CLUE
CLUE CELLS WET PREP: NONE SEEN
Trich, Wet Prep: NONE SEEN
Yeast Wet Prep HPF POC: NONE SEEN
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
## 2015-04-23 NOTE — Patient Instructions (Addendum)
Health Recommendations for Postmenopausal Women Respected and ongoing research has looked at the most common causes of death, disability, and poor quality of life in postmenopausal women. The causes include heart disease, diseases of blood vessels, diabetes, depression, cancer, and bone loss (osteoporosis). Many things can be done to help lower the chances of developing these and other common problems. CARDIOVASCULAR DISEASE Heart Disease: A heart attack is a medical emergency. Know the signs and symptoms of a heart attack. Below are things women can do to reduce their risk for heart disease.  1. Do not smoke. If you smoke, quit. 2. Aim for a healthy weight. Being overweight causes many preventable deaths. Eat a healthy and balanced diet and drink an adequate amount of liquids. 3. Get moving. Make a commitment to be more physically active. Aim for 30 minutes of activity on most, if not all days of the week. 4. Eat for heart health. Choose a diet that is low in saturated fat and cholesterol and eliminate trans fat. Include whole grains, vegetables, and fruits. Read and understand the labels on food containers before buying. 5. Know your numbers. Ask your caregiver to check your blood pressure, cholesterol (total, HDL, LDL, triglycerides) and blood glucose. Work with your caregiver on improving your entire clinical picture. 6. High blood pressure. Limit or stop your table salt intake (try salt substitute and food seasonings). Avoid salty foods and drinks. Read labels on food containers before buying. Eating well and exercising can help control high blood pressure. STROKE  Stroke is a medical emergency. Stroke may be the result of a blood clot in a blood vessel in the brain or by a brain hemorrhage (bleeding). Know the signs and symptoms of a stroke. To lower the risk of developing a stroke:  Avoid fatty foods.  Quit smoking.  Control your diabetes, blood pressure, and irregular heart  rate. THROMBOPHLEBITIS (BLOOD CLOT) OF THE LEG  Becoming overweight and leading a stationary lifestyle may also contribute to developing blood clots. Controlling your diet and exercising will help lower the risk of developing blood clots. CANCER SCREENING  Breast Cancer: Take steps to reduce your risk of breast cancer.  You should practice "breast self-awareness." This means understanding the normal appearance and feel of your breasts and should include breast self-examination. Any changes detected, no matter how small, should be reported to your caregiver.  After age 95, you should have a clinical breast exam (CBE) every year.  Starting at age 56, you should consider having a mammogram (breast X-ray) every year.  If you have a family history of breast cancer, talk to your caregiver about genetic screening.  If you are at high risk for breast cancer, talk to your caregiver about having an MRI and a mammogram every year.  Intestinal or Stomach Cancer: Tests to consider are a rectal exam, fecal occult blood, sigmoidoscopy, and colonoscopy. Women who are high risk may need to be screened at an earlier age and more often.  Cervical Cancer:  Beginning at age 1, you should have a Pap test every 3 years as long as the past 3 Pap tests have been normal.  If you have had past treatment for cervical cancer or a condition that could lead to cancer, you need Pap tests and screening for cancer for at least 20 years after your treatment.  If you had a hysterectomy for a problem that was not cancer or a condition that could lead to cancer, then you no longer need Pap tests.  If you are between ages 65 and 70, and you have had normal Pap tests going back 10 years, you no longer need Pap tests.  If Pap tests have been discontinued, risk factors (such as a new sexual partner) need to be reassessed to determine if screening should be resumed.  Some medical problems can increase the chance of getting  cervical cancer. In these cases, your caregiver may recommend more frequent screening and Pap tests.  Uterine Cancer: If you have vaginal bleeding after reaching menopause, you should notify your caregiver.  Ovarian Cancer: Other than yearly pelvic exams, there are no reliable tests available to screen for ovarian cancer at this time except for yearly pelvic exams.  Lung Cancer: Yearly chest X-rays can detect lung cancer and should be done on high risk women, such as cigarette smokers and women with chronic lung disease (emphysema).  Skin Cancer: A complete body skin exam should be done at your yearly examination. Avoid overexposure to the sun and ultraviolet light lamps. Use a strong sun block cream when in the sun. All of these things are important for lowering the risk of skin cancer. MENOPAUSE Menopause Symptoms: Hormone therapy products are effective for treating symptoms associated with menopause:  Moderate to severe hot flashes.  Night sweats.  Mood swings.  Headaches.  Tiredness.  Loss of sex drive.  Insomnia.  Other symptoms. Hormone replacement carries certain risks, especially in older women. Women who use or are thinking about using estrogen or estrogen with progestin treatments should discuss that with their caregiver. Your caregiver will help you understand the benefits and risks. The ideal dose of hormone replacement therapy is not known. The Food and Drug Administration (FDA) has concluded that hormone therapy should be used only at the lowest doses and for the shortest amount of time to reach treatment goals.  OSTEOPOROSIS Protecting Against Bone Loss and Preventing Fracture If you use hormone therapy for prevention of bone loss (osteoporosis), the risks for bone loss must outweigh the risk of the therapy. Ask your caregiver about other medications known to be safe and effective for preventing bone loss and fractures. To guard against bone loss or fractures, the  following is recommended:  If you are younger than age 50, take 1000 mg of calcium and at least 600 mg of Vitamin D per day.  If you are older than age 50 but younger than age 70, take 1200 mg of calcium and at least 600 mg of Vitamin D per day.  If you are older than age 70, take 1200 mg of calcium and at least 800 mg of Vitamin D per day. Smoking and excessive alcohol intake increases the risk of osteoporosis. Eat foods rich in calcium and vitamin D and do weight bearing exercises several times a week as your caregiver suggests. DIABETES Diabetes Mellitus: If you have type I or type 2 diabetes, you should keep your blood sugar under control with diet, exercise, and recommended medication. Avoid starchy and fatty foods, and too many sweets. Being overweight can make diabetes control more difficult. COGNITION AND MEMORY Cognition and Memory: Menopausal hormone therapy is not recommended for the prevention of cognitive disorders such as Alzheimer's disease or memory loss.  DEPRESSION  Depression may occur at any age, but it is common in elderly women. This may be because of physical, medical, social (loneliness), or financial problems and needs. If you are experiencing depression because of medical problems and control of symptoms, talk to your caregiver about this. Physical   activity and exercise may help with mood and sleep. Community and volunteer involvement may improve your sense of value and worth. If you have depression and you feel that the problem is getting worse or becoming severe, talk to your caregiver about which treatment options are best for you. ACCIDENTS  Accidents are common and can be serious in elderly woman. Prepare your house to prevent accidents. Eliminate throw rugs, place hand bars in bath, shower, and toilet areas. Avoid wearing high heeled shoes or walking on wet, snowy, and icy areas. Limit or stop driving if you have vision or hearing problems, or if you feel you are  unsteady with your movements and reflexes. HEPATITIS C Hepatitis C is a type of viral infection affecting the liver. It is spread mainly through contact with blood from an infected person. It can be treated, but if left untreated, it can lead to severe liver damage over the years. Many people who are infected do not know that the virus is in their blood. If you are a "baby-boomer", it is recommended that you have one screening test for Hepatitis C. IMMUNIZATIONS  Several immunizations are important to consider having during your senior years, including:   Tetanus, diphtheria, and pertussis booster shot.  Influenza every year before the flu season begins.  Pneumonia vaccine.  Shingles vaccine.  Others, as indicated based on your specific needs. Talk to your caregiver about these. Document Released: 12/02/2005 Document Revised: 02/24/2014 Document Reviewed: 07/28/2008 [facility] Patient Information 2015 [place], [place]. This information is not intended to replace advice given to you by your health care provider. Make sure you discuss any questions you have with your health care provider. Depression Depression refers to feeling sad, low, down in the dumps, blue, gloomy, or empty. In general, there are two kinds of depression: 7. Normal sadness or normal grief. This kind of depression is one that we all feel from time to time after upsetting life experiences, such as the loss of a job or the ending of a relationship. This kind of depression is considered normal, is short lived, and resolves within a few days to 2 weeks. Depression experienced after the loss of a loved one (bereavement) often lasts longer than 2 weeks but normally gets better with time. 8. Clinical depression. This kind of depression lasts longer than normal sadness or normal grief or interferes with your ability to function at home, at work, and in school. It also interferes with your personal relationships. It affects almost every  aspect of your life. Clinical depression is an illness. Symptoms of depression can also be caused by conditions other than those mentioned above, such as:  Physical illness. Some physical illnesses, including underactive thyroid gland (hypothyroidism), severe anemia, specific types of cancer, diabetes, uncontrolled seizures, heart and lung problems, strokes, and chronic pain are commonly associated with symptoms of depression.  Side effects of some prescription medicine. In some people, certain types of medicine can cause symptoms of depression.  Substance abuse. Abuse of alcohol and illicit drugs can cause symptoms of depression. SYMPTOMS Symptoms of normal sadness and normal grief include the following:  Feeling sad or crying for short periods of time.  Not caring about anything (apathy).  Difficulty sleeping or sleeping too much.  No longer able to enjoy the things you used to enjoy.  Desire to be by oneself all the time (social isolation).  Lack of energy or motivation.  Difficulty concentrating or remembering.  Change in appetite or weight.  Restlessness or agitation.  Symptoms of clinical depression include the same symptoms of normal sadness or normal grief and also the following symptoms:  Feeling sad or crying all the time.  Feelings of guilt or worthlessness.  Feelings of hopelessness or helplessness.  Thoughts of suicide or the desire to harm yourself (suicidal ideation).  Loss of touch with reality (psychotic symptoms). Seeing or hearing things that are not real (hallucinations) or having false beliefs about your life or the people around you (delusions and paranoia). DIAGNOSIS  The diagnosis of clinical depression is usually based on how bad the symptoms are and how long they have lasted. Your health care provider will also ask you questions about your medical history and substance use to find out if physical illness, use of prescription medicine, or substance  abuse is causing your depression. Your health care provider may also order blood tests. TREATMENT  Often, normal sadness and normal grief do not require treatment. However, sometimes antidepressant medicine is given for bereavement to ease the depressive symptoms until they resolve. The treatment for clinical depression depends on how bad the symptoms are but often includes antidepressant medicine, counseling with a mental health professional, or both. Your health care provider will help to determine what treatment is best for you. Depression caused by physical illness usually goes away with appropriate medical treatment of the illness. If prescription medicine is causing depression, talk with your health care provider about stopping the medicine, decreasing the dose, or changing to another medicine. Depression caused by the abuse of alcohol or illicit drugs goes away when you stop using these substances. Some adults need professional help in order to stop drinking or using drugs. SEEK IMMEDIATE MEDICAL CARE IF:  You have thoughts about hurting yourself or others.  You lose touch with reality (have psychotic symptoms).  You are taking medicine for depression and have a serious side effect. FOR MORE INFORMATION  National Alliance on Mental Illness: www.nami.[employer] of Mental Health: http://www.maynard.net/ Document Released: 10/07/2000 Document Revised: 02/24/2014 Document Reviewed: 01/09/2012 [facility] Patient Information 2015 [place], [place]. This information is not intended to replace advice given to you by your health care provider. Make sure you discuss any questions you have with your health care provider.

## 2015-04-24 LAB — URINALYSIS W MICROSCOPIC + REFLEX CULTURE
Bacteria, UA: NONE SEEN
Bilirubin Urine: NEGATIVE
CASTS: NONE SEEN
Crystals: NONE SEEN
Glucose, UA: NEGATIVE mg/dL
Hgb urine dipstick: NEGATIVE
KETONES UR: NEGATIVE mg/dL
Nitrite: NEGATIVE
Protein, ur: NEGATIVE mg/dL
Specific Gravity, Urine: 1.029 (ref 1.005–1.030)
Urobilinogen, UA: 0.2 mg/dL (ref 0.0–1.0)
pH: 5 (ref 5.0–8.0)

## 2015-04-25 LAB — URINE CULTURE
Colony Count: NO GROWTH
ORGANISM ID, BACTERIA: NO GROWTH

## 2015-04-28 LAB — CYTOLOGY - PAP

## 2015-05-01 ENCOUNTER — Encounter: Payer: Self-pay | Admitting: Women's Health

## 2015-05-01 ENCOUNTER — Ambulatory Visit (INDEPENDENT_AMBULATORY_CARE_PROVIDER_SITE_OTHER): Payer: BC Managed Care – PPO | Admitting: Women's Health

## 2015-05-01 DIAGNOSIS — N760 Acute vaginitis: Secondary | ICD-10-CM | POA: Diagnosis not present

## 2015-05-01 DIAGNOSIS — A499 Bacterial infection, unspecified: Secondary | ICD-10-CM

## 2015-05-01 DIAGNOSIS — N898 Other specified noninflammatory disorders of vagina: Secondary | ICD-10-CM

## 2015-05-01 DIAGNOSIS — B9689 Other specified bacterial agents as the cause of diseases classified elsewhere: Secondary | ICD-10-CM

## 2015-05-01 LAB — WET PREP FOR TRICH, YEAST, CLUE
TRICH WET PREP: NONE SEEN
Yeast Wet Prep HPF POC: NONE SEEN

## 2015-05-01 MED ORDER — METRONIDAZOLE 0.75 % VA GEL: VAGINAL | Status: DC

## 2015-05-01 NOTE — Patient Instructions (Signed)
Bacterial Vaginosis Bacterial vaginosis is an infection of the vagina. It happens when too many of certain germs (bacteria) grow in the vagina. HOME CARE  Take your medicine as told by your doctor.  Finish your medicine even if you start to feel better.  Do not have sex until you finish your medicine and are better.  Tell your sex partner that you have an infection. They should see their doctor for treatment.  Practice safe sex. Use condoms. Have only one sex partner. GET HELP IF:  You are not getting better after 3 days of treatment.  You have more grey fluid (discharge) coming from your vagina than before.  You have more pain than before.  You have a fever. MAKE SURE YOU:   Understand these instructions.  Will watch your condition.  Will get help right away if you are not doing well or get worse. Document Released: 07/19/2008 Document Revised: 07/31/2013 Document Reviewed: 05/22/2013 ExitCare Patient Information 2015 [employer]. This information is not intended to replace advice given to you by your health care provider. Make sure you discuss any questions you have with your health care provider.  

## 2015-05-01 NOTE — Progress Notes (Signed)
Patient ID: Thomas HoffFonda T Berry, female   DOB: 01/05/1956, 59 y.o.   MRN: [id number] Presents with complaint of increased vaginal discharge with odor especially after intercourse. Denies abdominal pain, urinary symptoms or fever. At annual exam last week, wet prep negative.  Exam: Appears well. External genitalia within normal limits, speculum exam scant amount of a yellow adherent discharge wet prep positive for amines, clues, TNTC bacteria.  Bacteria vaginosis  Plan: MetroGel vaginal cream 1 applicator at bedtime 5, alcohol precautions reviewed. Instructed to call if no relief of symptoms.

## 2015-05-07 ENCOUNTER — Encounter (HOSPITAL_COMMUNITY): Payer: Self-pay | Admitting: Emergency Medicine

## 2015-05-07 ENCOUNTER — Emergency Department (HOSPITAL_COMMUNITY)
Admission: EM | Admit: 2015-05-07 | Discharge: 2015-05-08 | Disposition: A | Discharge status: Home / Self Care | Payer: BC Managed Care – PPO | Attending: Emergency Medicine | Admitting: Emergency Medicine

## 2015-05-07 DIAGNOSIS — M1990 Unspecified osteoarthritis, unspecified site: Secondary | ICD-10-CM | POA: Diagnosis not present

## 2015-05-07 DIAGNOSIS — F329 Major depressive disorder, single episode, unspecified: Secondary | ICD-10-CM | POA: Diagnosis not present

## 2015-05-07 DIAGNOSIS — Z87891 Personal history of nicotine dependence: Secondary | ICD-10-CM | POA: Insufficient documentation

## 2015-05-07 DIAGNOSIS — Z8669 Personal history of other diseases of the nervous system and sense organs: Secondary | ICD-10-CM | POA: Diagnosis not present

## 2015-05-07 DIAGNOSIS — Y9389 Activity, other specified: Secondary | ICD-10-CM | POA: Diagnosis not present

## 2015-05-07 DIAGNOSIS — E669 Obesity, unspecified: Secondary | ICD-10-CM | POA: Diagnosis not present

## 2015-05-07 DIAGNOSIS — Y998 Other external cause status: Secondary | ICD-10-CM | POA: Diagnosis not present

## 2015-05-07 DIAGNOSIS — T63461A Toxic effect of venom of wasps, accidental (unintentional), initial encounter: Secondary | ICD-10-CM | POA: Insufficient documentation

## 2015-05-07 DIAGNOSIS — D649 Anemia, unspecified: Secondary | ICD-10-CM | POA: Diagnosis not present

## 2015-05-07 DIAGNOSIS — Y929 Unspecified place or not applicable: Secondary | ICD-10-CM | POA: Diagnosis not present

## 2015-05-07 DIAGNOSIS — T7840XA Allergy, unspecified, initial encounter: Secondary | ICD-10-CM

## 2015-05-07 DIAGNOSIS — Z79899 Other long term (current) drug therapy: Secondary | ICD-10-CM | POA: Insufficient documentation

## 2015-05-07 DIAGNOSIS — R220 Localized swelling, mass and lump, head: Secondary | ICD-10-CM | POA: Diagnosis present

## 2015-05-07 MED ORDER — DIPHENHYDRAMINE HCL 50 MG/ML IJ SOLN
25.0000 mg | Freq: Once | INTRAMUSCULAR | Status: AC
  Administered 2015-05-07: 25 mg via INTRAVENOUS
  Filled 2015-05-07: qty 1

## 2015-05-07 MED ORDER — METHYLPREDNISOLONE SODIUM SUCC 125 MG IJ SOLR: 125.0000 mg | Freq: Once | INTRAMUSCULAR | Status: DC

## 2015-05-07 MED ORDER — DEXAMETHASONE SODIUM PHOSPHATE 10 MG/ML IJ SOLN
10.0000 mg | Freq: Once | INTRAMUSCULAR | Status: AC
  Administered 2015-05-07: 10 mg via INTRAVENOUS
  Filled 2015-05-07: qty 1

## 2015-05-07 MED ORDER — EPINEPHRINE 0.3 MG/0.3ML IJ SOAJ: 0.3000 mg | Freq: Once | INTRAMUSCULAR | Status: DC

## 2015-05-07 MED ORDER — PREDNISONE 20 MG PO TABS: 40.0000 mg | ORAL_TABLET | Freq: Every day | ORAL | Status: DC

## 2015-05-07 MED ORDER — HYDROCODONE-ACETAMINOPHEN 7.5-325 MG/15ML PO SOLN: 15.0000 mL | Freq: Four times a day (QID) | ORAL | Status: DC | PRN

## 2015-05-07 MED ORDER — MORPHINE SULFATE 4 MG/ML IJ SOLN
4.0000 mg | Freq: Once | INTRAMUSCULAR | Status: AC
  Administered 2015-05-07: 4 mg via INTRAVENOUS
  Filled 2015-05-07: qty 1

## 2015-05-07 NOTE — ED Notes (Signed)
Pt arrives via [facility], pt was at pool, picked up her drink bottle to take a sip and got stung on the tongue and lip by a yellow jacket. Pt was not having any resp distress upon ems arrival but received 0.15 epi, 50 of benadryl, and 50 of zantac. Pt has visible swelling to tongue and states she unable to swallow. O2 sat 100 on ra. Pt alert, oriented.

## 2015-05-07 NOTE — ED Notes (Signed)
Patient asked for and received a cup of ice. 

## 2015-05-07 NOTE — Discharge Instructions (Signed)

## 2015-05-07 NOTE — ED Provider Notes (Signed)
CSN: [id number]     Arrival date & time 05/07/15  2100 History   First MD Initiated Contact with Patient 05/07/15 2109     Chief Complaint  Patient presents with  . Insect Bite  . Oral Swelling     (Consider location/radiation/quality/duration/timing/severity/associated sxs/prior Treatment) HPI Comments: Patient here after being stem by a yellow jacket when she was drinking her beer. Began to immediately note tongue swelling. The patient denied any rashes or pruritus. Denies any dyspnea. EMS was called and patient given epinephrine, Benadryl, Zantac. Still notes tongue swelling. She is mentating appropriately. Denies any prior history of allergies to yellow jackets.  The history is provided by the patient.    Past Medical History  Diagnosis Date  . Depression   . Arthritis   . Seasonal allergies   . Anemia   . Obese   . Sleep apnea 2012    took test in [place]-told to use cpap-could not ude   Past Surgical History  Procedure Laterality Date  . Total knee revision  07/30/2010    right; with lateral release  . Anterior cervical decomp/discectomy fusion  05/09/2008    C5-6, C6-7; with arthrodesis also  . Rotator cuff repair w/ distal clavicle excision  01/06/2006    right  . 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 Total knee arthroplasty  11/07/2003    right  . Shoulder arthroscopy  2003    rt  . Knee arthroscopy  2004    rt and lt knee  . Shoulder arthroscopy  2003    rt  . Tonsillectomy    . Gastric bypass  2002   Family History  Problem Relation Age of Onset  . Heart disease Mother   . Cancer Father    History  Substance Use Topics  . Smoking status: Former Smoker    Quit date: 05/16/1981  . Smokeless tobacco: Not on file  . Alcohol Use: 3.0 oz/week    5 Cans of beer per week   OB History    Gravida Para Term Preterm AB TAB SAB Ectopic Multiple Living       Review of Systems  All other systems reviewed and are  negative.     Allergies  Review of patient's allergies indicates no known allergies.  Home Medications   Prior to Admission medications   Medication Sig Start Date End Date Taking? Authorizing Provider  amphetamine-dextroamphetamine (ADDERALL XR) 30 MG 24 hr capsule as directed. 03/06/15   Historical Provider, MD  DULoxetine (CYMBALTA) 30 MG capsule Take 2 capsules by mouth daily. 02/12/15   Historical Provider, MD  ferrous sulfate 325 (65 FE) MG tablet Take 1 tablet (325 mg total) by mouth 2 (two) times daily with a meal. 09/26/14   Penny Pia, MD  FLUoxetine (PROZAC) 20 MG capsule Take 20 mg by mouth 2 (two) times daily.     Historical Provider, MD  HYDROcodone-acetaminophen (NORCO) 10-325 MG per tablet Take 1 tablet by mouth every 6 (six) hours as needed for moderate pain.  08/26/14   Historical Provider, MD  metroNIDAZOLE (METROGEL VAGINAL) 0.75 % vaginal gel 1 applicator per vagina at HS x 5 05/01/15   Harrington Challenger, NP  Potassium Gluconate 550 MG TABS Take 550 mg by mouth daily.    Historical Provider, MD  Pyridoxine HCl (B-6 PO) Take by mouth.    Historical Provider, MD  traZODone (DESYREL) 50 MG tablet  1/2 to 1 at night for sleep 12/11/14   Historical Provider, MD   BP 159/67 mmHg  Pulse 91  Temp(Src) 97.1 F (36.2 C) (Axillary)  Resp 22  SpO2 100% Physical Exam  Constitutional: She is oriented to person, place, and time. She appears well-developed and well-nourished.  Non-toxic appearance. No distress.  HENT:  Head: Normocephalic and atraumatic.  Mouth/Throat:    Eyes: Conjunctivae, EOM and lids are normal. Pupils are equal, round, and reactive to light.  Neck: Normal range of motion. Neck supple. No tracheal deviation present. No thyroid mass present.  Cardiovascular: Normal rate, regular rhythm and normal heart sounds.  Exam reveals no gallop.   No murmur heard. Pulmonary/Chest: Effort normal and breath sounds normal. No stridor. No respiratory distress. She has no  decreased breath sounds. She has no wheezes. She has no rhonchi. She has no rales.  Abdominal: Soft. Normal appearance and bowel sounds are normal. She exhibits no distension. There is no tenderness. There is no rebound and no CVA tenderness.  Musculoskeletal: Normal range of motion. She exhibits no edema or tenderness.  Neurological: She is alert and oriented to person, place, and time. She has normal strength. No cranial nerve deficit or sensory deficit. GCS eye subscore is 4. GCS verbal subscore is 5. GCS motor subscore is 6.  Skin: Skin is warm and dry. No abrasion and no rash noted.  Psychiatric: She has a normal mood and affect. Her speech is normal and behavior is normal.  Nursing note and vitals reviewed.   ED Course  Procedures (including critical care time) Labs Review Labs Reviewed - No data to display  Imaging Review No results found.   EKG Interpretation None      MDM   Final diagnoses:  None    Patient received Decadron and more Benadryl here. She was monitored for several hours and her tongue swelling is much improved. She is able to speak in full sentences. She was given morphine for pain here. She has no stridor. We assessed multiple times and patient wanted to go home at this time. I will place her on prednisone as well as give her oral pain medications as well as an EpiPen    Lorre NickAnthony Mohamadou Maciver, MD 05/07/15 2342

## 2015-05-14 ENCOUNTER — Telehealth: Payer: Self-pay | Admitting: Women's Health

## 2015-05-14 NOTE — Telephone Encounter (Signed)
05/14/15-Pt called and said she was told by her [facility] BC ins that she needed prior auth for the ZOX#09604 for her dexa scan. I called and was told that it was not needed. BC REF #[id number] )Francis Dowse has a $30 copay. The patient was told this information today/wl

## 2015-06-02 ENCOUNTER — Ambulatory Visit (INDEPENDENT_AMBULATORY_CARE_PROVIDER_SITE_OTHER): Payer: BC Managed Care – PPO

## 2015-06-02 ENCOUNTER — Ambulatory Visit (INDEPENDENT_AMBULATORY_CARE_PROVIDER_SITE_OTHER): Payer: BC Managed Care – PPO | Admitting: Women's Health

## 2015-06-02 ENCOUNTER — Encounter: Payer: Self-pay | Admitting: Women's Health

## 2015-06-02 VITALS — BP 132/80 | Ht 66.0 in | Wt 273.0 lb

## 2015-06-02 DIAGNOSIS — Z780 Asymptomatic menopausal state: Secondary | ICD-10-CM | POA: Diagnosis not present

## 2015-06-02 DIAGNOSIS — Z13820 Encounter for screening for osteoporosis: Secondary | ICD-10-CM

## 2015-06-02 NOTE — Progress Notes (Addendum)
Patient ID: [name], female   DOB: 1956-04-08, 58 y.o.   MRN: [id number] Presents with complaint of questionable memory issues. Was scheduled for a bone density this morning but states got lost in route. States grew up near [employer] but states area felt unfamiliar. Tearful while explaining, upset over situation. States is only drinking 4+ beers one-day weekly now, had been drinking more days than not. Denies difficulty with daily routine of eating, dressing, driving, until today. States  often misplaces keys and purse, but not many other things. Also is having difficulty with hoarding, has appointment with psychiatrist tomorrow. Questions if related to menopause.  Exam: Appears well, well groomed, speech appropriate . Tearful.  Questionable short-term memory loss vs anxiety Postmenopausal/no bleeding/no HRT  Plan: Instructed to keep scheduled appointment with psychiatrist. Congratulated on better choices. Requested oxycodone, previous primary care retired, reviewed we cannot prescribe, will need to seek care with new primary care. DEXA.

## 2015-12-03 ENCOUNTER — Ambulatory Visit (INDEPENDENT_AMBULATORY_CARE_PROVIDER_SITE_OTHER): Payer: BC Managed Care – PPO | Admitting: Physician Assistant

## 2015-12-03 VITALS — BP 164/92 | HR 84 | Temp 100.4°F | Resp 20 | Ht 66.0 in | Wt 283.0 lb

## 2015-12-03 DIAGNOSIS — IMO0001 Reserved for inherently not codable concepts without codable children: Secondary | ICD-10-CM

## 2015-12-03 DIAGNOSIS — I872 Venous insufficiency (chronic) (peripheral): Secondary | ICD-10-CM | POA: Diagnosis not present

## 2015-12-03 DIAGNOSIS — R609 Edema, unspecified: Secondary | ICD-10-CM

## 2015-12-03 DIAGNOSIS — L03119 Cellulitis of unspecified part of limb: Secondary | ICD-10-CM

## 2015-12-03 DIAGNOSIS — R509 Fever, unspecified: Secondary | ICD-10-CM | POA: Diagnosis not present

## 2015-12-03 DIAGNOSIS — R030 Elevated blood-pressure reading, without diagnosis of hypertension: Secondary | ICD-10-CM

## 2015-12-03 LAB — POCT CBC
Granulocyte percent: 75.2 %G (ref 37–80)
HCT, POC: 31.7 % — AB (ref 37.7–47.9)
Hemoglobin: 10.4 g/dL — AB (ref 12.2–16.2)
LYMPH, POC: 2.1 (ref 0.6–3.4)
MCH, POC: 30.7 pg (ref 27–31.2)
MCHC: 32.7 g/dL (ref 31.8–35.4)
MCV: 93.7 fL (ref 80–97)
MID (cbc): 0.6 (ref 0–0.9)
MPV: 6.5 fL (ref 0–99.8)
PLATELET COUNT, POC: 385 10*3/uL (ref 142–424)
POC Granulocyte: 8.3 — AB (ref 2–6.9)
POC LYMPH %: 19.2 % (ref 10–50)
POC MID %: 5.6 %M (ref 0–12)
RBC: 3.39 M/uL — AB (ref 4.04–5.48)
RDW, POC: 15.4 %
WBC: 11 10*3/uL — AB (ref 4.6–10.2)

## 2015-12-03 MED ORDER — FLUCONAZOLE 150 MG PO TABS: 150.0000 mg | ORAL_TABLET | Freq: Once | ORAL | Status: DC

## 2015-12-03 MED ORDER — DOXYCYCLINE HYCLATE 100 MG PO CAPS: 100.0000 mg | ORAL_CAPSULE | Freq: Two times a day (BID) | ORAL | Status: DC

## 2015-12-03 MED ORDER — CEFTRIAXONE SODIUM 1 G IJ SOLR
1.0000 g | Freq: Once | INTRAMUSCULAR | Status: AC
  Administered 2015-12-03: 1 g via INTRAMUSCULAR

## 2015-12-03 MED ORDER — HYDROCODONE-ACETAMINOPHEN 5-325 MG PO TABS: 1.0000 | ORAL_TABLET | Freq: Four times a day (QID) | ORAL | Status: DC | PRN

## 2015-12-03 MED ORDER — SULFAMETHOXAZOLE-TRIMETHOPRIM 800-160 MG PO TABS: 1.0000 | ORAL_TABLET | Freq: Two times a day (BID) | ORAL | Status: DC

## 2015-12-03 NOTE — Progress Notes (Signed)
[facility] [address], [place] [address] [phone]  Date:  12/03/2015   Name:  [name]   DOB:  15-Aug-1956   MRN:  [id number]  PCP:  Norberto Sorenson, MD    Chief Complaint: Mass   History of Present Illness:  This is a 60 y.o. female with PMH alcohol abuse, anemia who is presenting with bilateral lower leg edema and erythema. I received phone call from Dr. Ardyth Gal from [facility] an hour before pt arrived. He was seeing her for venous insufficiency. Saw her on 2/7 and noticed she was starting to get some cellulitic changes in her legs. Put her on augmentin and told her to follow up on 2/9. Today noticed that cellulitis was much worse and starting to form abscesses. He told her to come here. Pt is endorsing severe pain in the legs. Has 1 abscess on right leg that is draining. 2 abscess on left leg that are not draining. Has fever to 100.4. She thinks she started having a fever yesterday but did not check. She has never had a problem like this before. Pt does not want to be sent to the hospital. 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   Review of Systems:  Review of Systems See HPI  Patient Active Problem List   Diagnosis Date Noted  . Alcohol abuse 11/21/2014  . Anxiety state 11/21/2014  . BMI 40.0-44.9, adult (HCC) 11/21/2014  . Acute blood loss anemia 09/25/2014  . Epistaxis 09/24/2014    Prior to Admission medications   Medication Sig Start Date End Date Taking? Authorizing Provider  DULoxetine (CYMBALTA) 30 MG capsule Take 2 capsules by mouth daily. 02/12/15  Yes Historical Provider, MD  EPINEPHrine 0.3 mg/0.3 mL IJ SOAJ injection Inject 0.3 mLs (0.3 mg total) into the muscle once. 05/07/15  Yes Lorre Nick, MD  FLUoxetine (PROZAC) 20 MG capsule Take 20 mg by mouth 2 (two) times daily.    Yes Historical Provider, MD  Potassium Gluconate 550 MG TABS Take 550 mg by mouth daily.   Yes Historical Provider,  MD  traZODone (DESYREL) 50 MG tablet TAKE 50 MG BY MOUTH DAILY AT BEDTIME AS NEEDED FOR SLEEP 12/11/14  Yes Historical Provider, MD       Historical Provider, MD       Dorna Leitz, PA-C       Penny Pia, MD       Dorna Leitz, PA-C       Dorna Leitz, PA-C       Historical Provider, MD       Dorna Leitz, PA-C    No Known Allergies  Past Surgical History  Procedure Laterality Date  . Total knee revision  07/30/2010    right; with lateral release  . Anterior cervical decomp/discectomy fusion  05/09/2008    C5-6, C6-7; with arthrodesis also  . Rotator cuff repair w/ distal clavicle excision  01/06/2006    right  . 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 Total knee arthroplasty  11/07/2003    right  . Shoulder arthroscopy  2003    rt  . Knee arthroscopy  2004    rt and lt knee  . Shoulder arthroscopy  2003    rt  . Tonsillectomy    . Gastric bypass  2002    Social History  Substance Use Topics  . Smoking status:  Former Smoker    Quit date: 05/16/1981  . Smokeless tobacco: None  . Alcohol Use: 3.0 oz/week    5 Cans of beer per week    Family History  Problem Relation Age of Onset  . Heart disease Mother   . Cancer Father     Medication list has been reviewed and updated.  Physical Examination:  Physical Exam  Constitutional: She is oriented to person, place, and time. She appears well-developed and well-nourished. No distress.  HENT:  Head: Normocephalic and atraumatic.  Right Ear: Hearing normal.  Left Ear: Hearing normal.  Nose: Nose normal.  Eyes: Conjunctivae and lids are normal. Right eye exhibits no discharge. Left eye exhibits no discharge. No scleral icterus.  Cardiovascular: Normal rate, regular rhythm, normal heart sounds and normal pulses.   No murmur heard. Pulmonary/Chest: Effort normal and breath sounds normal. No respiratory distress. She has no wheezes. She has no rhonchi. She has no rales.  Musculoskeletal: Normal range of motion.   Neurological: She is alert and oriented to person, place, and time.  Skin: Skin is warm and dry.  Erythema and swelling of bilateral lower legs from ankles to inferior to knee. + warmth. Right leg with bulging abscess with yellow crusted drainage. Surrounding fluctuance. Significant tenderness. Left leg with two fluctuance abscess. No drainage. No lymphatic streaking.  Psychiatric: She has a normal mood and affect. Her speech is normal and behavior is normal. Thought content normal.   BP 164/92 mmHg  Pulse 84  Temp(Src) 100.4 F (38 C) (Oral)  Resp 20  Ht 5\' 6"  (1.676 m)  Wt 283 lb (128.368 kg)  BMI 45.70 kg/m2  SpO2 95%  Results for orders placed or performed in visit on 12/03/15  POCT CBC  Result Value Ref Range   WBC 11.0 (A) 4.6 - 10.2 K/uL   Lymph, poc 2.1 0.6 - 3.4   POC LYMPH PERCENT 19.2 10 - 50 %L   MID (cbc) 0.6 0 - 0.9   POC MID % 5.6 0 - 12 %M   POC Granulocyte 8.3 (A) 2 - 6.9   Granulocyte percent 75.2 37 - 80 %G   RBC 3.39 (A) 4.04 - 5.48 M/uL   Hemoglobin 10.4 (A) 12.2 - 16.2 g/dL   HCT, POC 16.1 (A) 09.6 - 47.9 %   MCV 93.7 80 - 97 fL   MCH, POC 30.7 27 - 31.2 pg   MCHC 32.7 31.8 - 35.4 g/dL   RDW, POC 04.5 %   Platelet Count, POC 385 142 - 424 K/uL   MPV 6.5 0 - 99.8 fL   Procedure: Verbal consent obtained. Skin was cleaned with alcohol and anesthetized with total of  5 cc 1% lido with epi. A 1 cm incision was made in abscess of right leg. A 1 cm incision was made in each of abscesses on left leg (2 total). Mostly sanguinous material expressed from all three abscesses. One abscess on left leg with purulence expresses as well. Wounds packed with 1/4 inch packing. Wound dressed and wound care discussed.  Assessment and Plan:  1. Cellulitis of lower extremity, unspecified laterality 2. Fever, unspecified 3. Edema 4. Chronic venous insufficiency Bilateral lower extremity cellulitis. Three abscesses I&D with only minimal purulence expressed. Possible abscess  appearing areas d/t edema? CBC with wbc of only 11. + fever. Gave 1 gm rocephin. Will treat with doxy and bactrim while awaiting wound culture. norco given for pain. Pt requested diflucan since gets yeast infections with abx. Return  in 24 hours for recheck. - POCT CBC - cefTRIAXone (ROCEPHIN) injection 1 g; Inject 1 g into the muscle once. - doxycycline (VIBRAMYCIN) 100 MG capsule; Take 1 capsule (100 mg total) by mouth 2 (two) times daily. AVOID EXCESS SUN EXPOSURE WHILE ON THIS MEDICATION  Dispense: 20 capsule; Refill: 0 - Wound culture - sulfamethoxazole-trimethoprim (BACTRIM DS,SEPTRA DS) 800-160 MG tablet; Take 1 tablet by mouth 2 (two) times daily.  Dispense: 20 tablet; Refill: 0 - fluconazole (DIFLUCAN) 150 MG tablet; Take 1 tablet (150 mg total) by mouth once. Repeat if needed  Dispense: 2 tablet; Refill: 0 - HYDROcodone-acetaminophen (NORCO) 5-325 MG tablet; Take 1 tablet by mouth every 6 (six) hours as needed.  Dispense: 15 tablet; Refill: 0  5. Elevated BP Elevated bp here. Could be d/t pain and infection. Recheck tomorrow.   [place]well Min[place] Brackett, MHS [facility]  12/04/2015

## 2015-12-03 NOTE — Patient Instructions (Signed)
Take doxy and bactrim twice a day. We will stop one of these once we see improvement and get results from your wound culture. Take norco for pain Diflucan if you get a yeast infection Apply heat Return tomorrow for recheck

## 2015-12-04 ENCOUNTER — Encounter (HOSPITAL_COMMUNITY): Payer: Self-pay | Admitting: Nurse Practitioner

## 2015-12-04 ENCOUNTER — Inpatient Hospital Stay (HOSPITAL_COMMUNITY)
Admission: EM | Admit: 2015-12-04 | Discharge: 2015-12-13 | DRG: 603 | Disposition: A | Discharge status: Home / Self Care | Payer: BC Managed Care – PPO | Attending: Family Medicine | Admitting: Family Medicine

## 2015-12-04 ENCOUNTER — Ambulatory Visit (INDEPENDENT_AMBULATORY_CARE_PROVIDER_SITE_OTHER): Payer: BC Managed Care – PPO | Admitting: Physician Assistant

## 2015-12-04 VITALS — BP 154/94 | HR 84 | Temp 99.3°F | Resp 17 | Ht 66.5 in | Wt 287.0 lb

## 2015-12-04 DIAGNOSIS — L03116 Cellulitis of left lower limb: Principal | ICD-10-CM | POA: Diagnosis present

## 2015-12-04 DIAGNOSIS — I872 Venous insufficiency (chronic) (peripheral): Secondary | ICD-10-CM

## 2015-12-04 DIAGNOSIS — K5900 Constipation, unspecified: Secondary | ICD-10-CM | POA: Diagnosis not present

## 2015-12-04 DIAGNOSIS — R509 Fever, unspecified: Secondary | ICD-10-CM

## 2015-12-04 DIAGNOSIS — Z9884 Bariatric surgery status: Secondary | ICD-10-CM

## 2015-12-04 DIAGNOSIS — F1010 Alcohol abuse, uncomplicated: Secondary | ICD-10-CM | POA: Diagnosis present

## 2015-12-04 DIAGNOSIS — L0231 Cutaneous abscess of buttock: Secondary | ICD-10-CM | POA: Diagnosis present

## 2015-12-04 DIAGNOSIS — R05 Cough: Secondary | ICD-10-CM | POA: Insufficient documentation

## 2015-12-04 DIAGNOSIS — I878 Other specified disorders of veins: Secondary | ICD-10-CM | POA: Diagnosis present

## 2015-12-04 DIAGNOSIS — L02419 Cutaneous abscess of limb, unspecified: Secondary | ICD-10-CM | POA: Insufficient documentation

## 2015-12-04 DIAGNOSIS — Z96653 Presence of artificial knee joint, bilateral: Secondary | ICD-10-CM | POA: Diagnosis present

## 2015-12-04 DIAGNOSIS — E669 Obesity, unspecified: Secondary | ICD-10-CM | POA: Diagnosis present

## 2015-12-04 DIAGNOSIS — L03119 Cellulitis of unspecified part of limb: Secondary | ICD-10-CM

## 2015-12-04 DIAGNOSIS — L0390 Cellulitis, unspecified: Secondary | ICD-10-CM

## 2015-12-04 DIAGNOSIS — R739 Hyperglycemia, unspecified: Secondary | ICD-10-CM | POA: Diagnosis present

## 2015-12-04 DIAGNOSIS — F329 Major depressive disorder, single episode, unspecified: Secondary | ICD-10-CM | POA: Diagnosis present

## 2015-12-04 DIAGNOSIS — Z6841 Body Mass Index (BMI) 40.0 and over, adult: Secondary | ICD-10-CM

## 2015-12-04 DIAGNOSIS — Z981 Arthrodesis status: Secondary | ICD-10-CM

## 2015-12-04 DIAGNOSIS — L03115 Cellulitis of right lower limb: Secondary | ICD-10-CM | POA: Diagnosis present

## 2015-12-04 DIAGNOSIS — M79669 Pain in unspecified lower leg: Secondary | ICD-10-CM | POA: Insufficient documentation

## 2015-12-04 DIAGNOSIS — D509 Iron deficiency anemia, unspecified: Secondary | ICD-10-CM | POA: Diagnosis present

## 2015-12-04 DIAGNOSIS — L089 Local infection of the skin and subcutaneous tissue, unspecified: Secondary | ICD-10-CM | POA: Diagnosis present

## 2015-12-04 DIAGNOSIS — R609 Edema, unspecified: Secondary | ICD-10-CM

## 2015-12-04 DIAGNOSIS — G4733 Obstructive sleep apnea (adult) (pediatric): Secondary | ICD-10-CM | POA: Diagnosis present

## 2015-12-04 DIAGNOSIS — L0291 Cutaneous abscess, unspecified: Secondary | ICD-10-CM | POA: Diagnosis present

## 2015-12-04 DIAGNOSIS — Z87891 Personal history of nicotine dependence: Secondary | ICD-10-CM

## 2015-12-04 DIAGNOSIS — L4050 Arthropathic psoriasis, unspecified: Secondary | ICD-10-CM | POA: Insufficient documentation

## 2015-12-04 DIAGNOSIS — R062 Wheezing: Secondary | ICD-10-CM | POA: Insufficient documentation

## 2015-12-04 DIAGNOSIS — L02415 Cutaneous abscess of right lower limb: Secondary | ICD-10-CM | POA: Diagnosis present

## 2015-12-04 DIAGNOSIS — R059 Cough, unspecified: Secondary | ICD-10-CM | POA: Insufficient documentation

## 2015-12-04 DIAGNOSIS — L02416 Cutaneous abscess of left lower limb: Secondary | ICD-10-CM | POA: Diagnosis present

## 2015-12-04 DIAGNOSIS — M79661 Pain in right lower leg: Secondary | ICD-10-CM

## 2015-12-04 DIAGNOSIS — D649 Anemia, unspecified: Secondary | ICD-10-CM | POA: Diagnosis present

## 2015-12-04 DIAGNOSIS — F32A Depression, unspecified: Secondary | ICD-10-CM | POA: Diagnosis present

## 2015-12-04 DIAGNOSIS — M79605 Pain in left leg: Secondary | ICD-10-CM | POA: Diagnosis not present

## 2015-12-04 DIAGNOSIS — N179 Acute kidney failure, unspecified: Secondary | ICD-10-CM | POA: Diagnosis present

## 2015-12-04 HISTORY — DX: Venous insufficiency (chronic) (peripheral): I87.2

## 2015-12-04 MED ORDER — HYDROMORPHONE HCL 1 MG/ML IJ SOLN
1.0000 mg | Freq: Once | INTRAMUSCULAR | Status: AC
  Administered 2015-12-04: 1 mg via INTRAVENOUS
  Filled 2015-12-04: qty 1

## 2015-12-04 MED ORDER — VANCOMYCIN HCL 10 G IV SOLR
2000.0000 mg | Freq: Once | INTRAVENOUS | Status: AC
  Administered 2015-12-04: 2000 mg via INTRAVENOUS
  Filled 2015-12-04: qty 2000

## 2015-12-04 MED ORDER — SODIUM BICARBONATE 4 % IV SOLN
5.0000 mL | Freq: Once | INTRAVENOUS | Status: AC
  Administered 2015-12-04: 5 mL via SUBCUTANEOUS
  Filled 2015-12-04: qty 5

## 2015-12-04 MED ORDER — LIDOCAINE HCL (PF) 1 % IJ SOLN
5.0000 mL | Freq: Once | INTRAMUSCULAR | Status: AC
  Administered 2015-12-04: 5 mL
  Filled 2015-12-04: qty 5

## 2015-12-04 NOTE — Progress Notes (Addendum)
Pharmacy Antibiotic Note  [name] is a 60 y.o. female admitted on 12/04/2015 with cellulitis.  Pharmacy has been consulted for Vancomycin dosing.  Plan: Vancomycin 2g IV load.  Goal trough 10-15 mcg/mL. F/u labs to redose vancomycin Monitor culture data, renal function and clinical course VT at SS prn  Height:  (167.6 cm) Weight: 283 lb 1.6 oz (128.413 kg) IBW/kg (Calculated) : 59.3  Temp (24hrs), Avg:99.3 F (37.4 C), Min:99.2 F (37.3 C), Max:99.3 F (37.4 C)   Recent Labs Lab 12/03/15 1724  WBC 11.0*    CrCl cannot be calculated (Patient has no serum creatinine result on file.).    No Known Allergies  Antimicrobials this admission: Vanc 2/10 >>    >>   Dose adjustments this admission:   Microbiology results:  BCx:   UCx:    Sputum:    MRSA PCR:  2/10 WCx: ngtd  Arlean Hopping. Newman Pies, PharmD, BCPS Clinical Pharmacist Pager [phone] 12/04/2015 10:36 PM    ADDENDUM: CrCl ~90 ml/min.  Will continue with vancomycin 1g IV Q12H and monitor CBC, Cx, levels prn. Vernard Gambles, PharmD, BCPS 12/05/2015 1:20 AM

## 2015-12-04 NOTE — ED Notes (Signed)
She reports 1 week history of generalized swelling, redness and pain to BLE with some red sore areas to both thighs as well. She had boils lanced to both lower extremities yesterday at an urgent care and followed up today and they referred her to ED for further evaluation of infection and possible IV aBX. She reports low fevers this week also.

## 2015-12-04 NOTE — Progress Notes (Signed)
[facility] [address] [place] [address] [phone]  Date:  12/04/2015   Name:  [name]   DOB:  [birth year]   MRN:  [id number]  PCP:  Norberto Sorenson, MD    Chief Complaint: Follow-up   History of Present Illness:  This is a 60 y.o. female with PMH chronic venous insufficiency, alcohol abuse and HTN who is presenting for follow up cellulitis. I saw pt yesterday for bilateral leg cellulitis. She had seen vein specialist, Dr. Ardyth Gal, 2 days prior and started on augmentin. He saw her again yesterday (2/9) with worsening cellulitis - sent here. She had temp of 100.4. Exquisitely tender legs, erythema R>L, pitting edema. 3 abscess formations - 1 on right leg and 2 on left leg. No lymphangitic streaking. I&D of all three abscess with mostly blood expressed and minimal purulence mixed in. WBC surprisingly only 11. Gave 1 gm rocephin in office. Sent home with doxy and bactrim while awaiting culture. Instructed to come back today -- cellulitis of lower legs looks about the same. Still very tender. Packing in place. Has two new nodular lesions on left thigh. These are tender. Has been taking norco with tylenol. Temp decreased today to 99.3.  Review of Systems:  Review of Systems See HPI  Patient Active Problem List   Diagnosis Date Noted  . Chronic venous insufficiency 12/04/2015  . Alcohol abuse 11/21/2014  . Anxiety state 11/21/2014  . BMI 40.0-44.9, adult (HCC) 11/21/2014  . Acute blood loss anemia 09/25/2014  . Epistaxis 09/24/2014    Prior to Admission medications   Medication Sig Start Date End Date Taking? Authorizing Provider  amphetamine-dextroamphetamine (ADDERALL XR) 30 MG 24 hr capsule Take 30 mg by mouth 2 (two) times daily. Reported on 12/03/2015 03/06/15  Yes Historical Provider, MD  doxycycline (VIBRAMYCIN) 100 MG capsule Take 1 capsule (100 mg total) by mouth 2 (two) times daily. AVOID EXCESS SUN EXPOSURE WHILE ON THIS MEDICATION 12/03/15 12/13/15  Yes Lanier Clam V, PA-C  DULoxetine (CYMBALTA) 30 MG capsule Take 2 capsules by mouth daily. 02/12/15  Yes Historical Provider, MD  EPINEPHrine 0.3 mg/0.3 mL IJ SOAJ injection Inject 0.3 mLs (0.3 mg total) into the muscle once. 05/07/15  Yes Lorre Nick, MD  ferrous sulfate 325 (65 FE) MG tablet Take 1 tablet (325 mg total) by mouth 2 (two) times daily with a meal. 09/26/14  Yes Penny Pia, MD  fluconazole (DIFLUCAN) 150 MG tablet Take 1 tablet (150 mg total) by mouth once. Repeat if needed 12/03/15  Yes Lanier Clam V, PA-C  FLUoxetine (PROZAC) 20 MG capsule Take 20 mg by mouth 2 (two) times daily.    Yes Historical Provider, MD  HYDROcodone-acetaminophen (NORCO) 5-325 MG tablet Take 1 tablet by mouth every 6 (six) hours as needed. 12/03/15  Yes Dorna Leitz, PA-C  Potassium Gluconate 550 MG TABS Take 550 mg by mouth daily.   Yes Historical Provider, MD  Pyridoxine HCl (B-6 PO) Take 1 tablet by mouth daily. Reported on 12/03/2015   Yes Historical Provider, MD  sulfamethoxazole-trimethoprim (BACTRIM DS,SEPTRA DS) 800-160 MG tablet Take 1 tablet by mouth 2 (two) times daily. 12/03/15  Yes Lanier Clam V, PA-C  traZODone (DESYREL) 50 MG tablet TAKE 50 MG BY MOUTH DAILY AT BEDTIME AS NEEDED FOR SLEEP 12/11/14  Yes Historical Provider, MD    No Known Allergies  Past Surgical History  Procedure Laterality Date  . Total knee revision  07/30/2010    right; with lateral  release  . Anterior cervical decomp/discectomy fusion  05/09/2008    C5-6, C6-7; with arthrodesis also  . Rotator cuff repair w/ distal clavicle excision  01/06/2006    right  . 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 Total knee arthroplasty  11/07/2003    right  . Shoulder arthroscopy  2003    rt  . Knee arthroscopy  2004    rt and lt knee  . Shoulder arthroscopy  2003    rt  . Tonsillectomy    . Gastric bypass  2002    Social History  Substance Use Topics  . Smoking status: Former Smoker    Quit date: 05/16/1981  .  Smokeless tobacco: None  . Alcohol Use: 3.0 oz/week    5 Cans of beer per week    Family History  Problem Relation Age of Onset  . Heart disease Mother   . Cancer Father     Medication list has been reviewed and updated.  Physical Examination:  Physical Exam  Constitutional: She is oriented to person, place, and time. She appears well-developed and well-nourished. No distress.  HENT:  Head: Normocephalic and atraumatic.  Right Ear: Hearing normal.  Left Ear: Hearing normal.  Nose: Nose normal.  Eyes: Conjunctivae and lids are normal. Right eye exhibits no discharge. Left eye exhibits no discharge. No scleral icterus.  Pulmonary/Chest: Effort normal. No respiratory distress.  Musculoskeletal: Normal range of motion.  Lymphadenopathy:       Right: No inguinal adenopathy present.       Left: No inguinal adenopathy present.  Neurological: She is alert and oriented to person, place, and time.  Skin: Skin is warm and dry.  Bilateral lower extremities with erythema and warmth, R>L. Discrete line demarcation above right ankle and stops inferior to knee. Left leg with erythema over anterior leg. New nodular, erythematous and tender lesions of left lateral thigh. No lymphangitic streaking. + pitting edema.  Psychiatric: She has a normal mood and affect. Her speech is normal and behavior is normal. Thought content normal.   BP 154/94 mmHg  Pulse 84  Temp(Src) 99.3 F (37.4 C) (Oral)  Resp 17  Ht 5' 6.5" (1.689 m)  Wt 287 lb (130.182 kg)  BMI 45.63 kg/m2  SpO2 97%  Assessment and Plan:  1. Cellulitis of lower extremity, unspecified laterality 2. Fever, unspecified 3. Edema, unspecified type 4. Chronic venous insufficiency No significant change in cellulitis with doxy, bactrim and 1 gm rocephin. New lesions on left thigh. Discussed case with Dr. Cleta Alberts. Decided to send patient to ED for IV abx. Called and spoke with charge nurse -- they will be going by private  vehicle.   [place] Brackett, MHS [facility]  12/04/2015

## 2015-12-04 NOTE — ED Provider Notes (Signed)
CSN: [id number]     Arrival date & time 12/04/15  1504 History   First MD Initiated Contact with Patient 12/04/15 2217     Chief Complaint  Patient presents with  . Leg Pain     Patient is a 60 y.o. female presenting with leg pain. The history is provided by the patient.  Leg Pain Associated symptoms: back pain   Associated symptoms: no fever    patient presents with pain in her legs and likely infection. She was seen by a vein surgery first and then [facility]. She has been on Rocephin and doxycycline and Bactrim. Seen yesterday and today and has not improved. She states she has a new abscess on her left leg. States there is extra 2 no abscess up there. No history of abscesses in the past. States she has 3 one or legs. States she has a slight redness and elixir with patient not this red. She also states she is in a lot of pain. 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  Past Medical History  Diagnosis Date  . Depression   . Arthritis   . Seasonal allergies   . Anemia   . Obese   . Sleep apnea 2012    took test in [place]-told to use cpap-could not ude   Past Surgical History  Procedure Laterality Date  . Total knee revision  07/30/2010    right; with lateral release  . Anterior cervical decomp/discectomy fusion  05/09/2008    C5-6, C6-7; with arthrodesis also  . Rotator cuff repair w/ distal clavicle excision  01/06/2006    right  . 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 Total knee arthroplasty  11/07/2003    right  . Shoulder arthroscopy  2003    rt  . Knee arthroscopy  2004    rt and lt knee  . Shoulder arthroscopy  2003    rt  . Tonsillectomy    . Gastric bypass  2002   Family History  Problem Relation Age of Onset  . Heart disease Mother   . Cancer Father    Social History  Substance Use Topics  . Smoking status: Former Smoker    Quit date: 05/16/1981  . Smokeless tobacco: None  . Alcohol Use: 3.0 oz/week     5 Cans of beer per week   OB History    Gravida Para Term Preterm AB TAB SAB Ectopic Multiple Living       Review of Systems  Constitutional: Negative for fever, chills and appetite change.  Respiratory: Negative for shortness of breath.   Cardiovascular: Negative for chest pain.  Gastrointestinal: Negative for abdominal pain.  Genitourinary: Negative for flank pain.  Musculoskeletal: Positive for back pain.  Skin: Positive for color change and wound.      Allergies  Review of patient's allergies indicates no known allergies.  Home Medications   Prior to Admission medications   Medication Sig Start Date End Date Taking? Authorizing Provider  amphetamine-dextroamphetamine (ADDERALL XR) 30 MG 24 hr capsule Take 30 mg by mouth 2 (two) times daily. Reported on 12/03/2015 03/06/15  Yes Historical Provider, MD  doxycycline (VIBRAMYCIN) 100 MG capsule Take 1 capsule (100 mg total) by mouth 2 (two) times daily. AVOID EXCESS SUN EXPOSURE WHILE ON THIS MEDICATION 12/03/15 12/13/15 Yes Lanier Clam V, PA-C  DULoxetine (CYMBALTA) 30  MG capsule Take 2 capsules by mouth daily. 02/12/15  Yes Historical Provider, MD  EPINEPHrine 0.3 mg/0.3 mL IJ SOAJ injection Inject 0.3 mLs (0.3 mg total) into the muscle once. 05/07/15  Yes Lorre Nick, MD  FLUoxetine (PROZAC) 20 MG capsule Take 20 mg by mouth 2 (two) times daily.    Yes Historical Provider, MD  HYDROcodone-acetaminophen (NORCO) 5-325 MG tablet Take 1 tablet by mouth every 6 (six) hours as needed. 12/03/15  Yes Lanier Clam V, PA-C  sulfamethoxazole-trimethoprim (BACTRIM DS,SEPTRA DS) 800-160 MG tablet Take 1 tablet by mouth 2 (two) times daily. 12/03/15  Yes Lanier Clam V, PA-C  traZODone (DESYREL) 50 MG tablet TAKE 50 MG BY MOUTH DAILY AT BEDTIME AS NEEDED FOR SLEEP 12/11/14  Yes Historical Provider, MD   BP 135/77 mmHg  Pulse 96  Temp(Src) 99.2 F (37.3 C) (Oral)  Resp 18  Ht  (1.676 m)  Wt 283 lb 1.6 oz (128.413 kg)   BMI 45.72 kg/m2  SpO2 95% Physical Exam  Constitutional: She appears well-developed.  HENT:  Head: Atraumatic.  Neck: Neck supple.  Cardiovascular: Normal rate.   Pulmonary/Chest: Effort normal.  Abdominal: Soft. There is no tenderness.  Musculoskeletal: She exhibits edema.  Erythema and edema to bilateral lower legs. Dressing over 2 abscesses that have been drained on her left lower leg and one on her right lower leg. The one on the right lower leg now has a 2 cm fluctuant area medial to the previous abscess. There is purulent drainage of this that was sent for culture. On her left thigh there is a 3 cm tender indurated area laterally along with another 2 cm area more distally. The superior abscess does have fluctuance.  Skin:  Moderate erythema and chronic skin changes to right lower leg. Less erythema to left lower leg.  Psychiatric: She has a normal mood and affect.    ED Course  Procedures (including critical care time) Labs Review Labs Reviewed  WOUND CULTURE  CBC WITH DIFFERENTIAL/PLATELET  COMPREHENSIVE METABOLIC PANEL    Imaging Review No results found. I have personally reviewed and evaluated these images and lab results as part of my medical decision-making.   EKG Interpretation None      MDM   Final diagnoses:  Cellulitis of both lower extremities  Abscess of lower leg    Patient has been being treated for cellulitis/abscess of lower legs. Has had fevers. Has been on antibiotics. Has been on Bactrim and doxy and has had Rocephin. Left thigh abscess drained with only bloody drainage. Will treat with antibiotics and admission.  INCISION AND DRAINAGE Performed by: Billee Cashing. Consent: Verbal consent obtained. Risks and benefits: risks, benefits and alternatives were discussed Type: abscess  Body area: left thigh  Anesthesia: local infiltration  Incision was made with a scalpel.  Local anesthetic: lidocaine 1% with neut  Anesthetic total: 4  ml  Complexity: complex Blunt dissection to break up loculations  Drainage: bloody  Drainage amount: moderate  Patient tolerance: Patient tolerated the procedure well with no immediate complications.       Benjiman Core, MD 12/05/15 [phone]

## 2015-12-05 ENCOUNTER — Encounter (HOSPITAL_COMMUNITY): Payer: Self-pay | Admitting: Internal Medicine

## 2015-12-05 DIAGNOSIS — L0390 Cellulitis, unspecified: Secondary | ICD-10-CM

## 2015-12-05 DIAGNOSIS — I878 Other specified disorders of veins: Secondary | ICD-10-CM | POA: Diagnosis present

## 2015-12-05 DIAGNOSIS — F329 Major depressive disorder, single episode, unspecified: Secondary | ICD-10-CM | POA: Diagnosis not present

## 2015-12-05 DIAGNOSIS — Z87891 Personal history of nicotine dependence: Secondary | ICD-10-CM | POA: Diagnosis not present

## 2015-12-05 DIAGNOSIS — L03116 Cellulitis of left lower limb: Principal | ICD-10-CM

## 2015-12-05 DIAGNOSIS — G4733 Obstructive sleep apnea (adult) (pediatric): Secondary | ICD-10-CM | POA: Diagnosis present

## 2015-12-05 DIAGNOSIS — E669 Obesity, unspecified: Secondary | ICD-10-CM | POA: Diagnosis present

## 2015-12-05 DIAGNOSIS — R739 Hyperglycemia, unspecified: Secondary | ICD-10-CM | POA: Diagnosis present

## 2015-12-05 DIAGNOSIS — R05 Cough: Secondary | ICD-10-CM | POA: Diagnosis not present

## 2015-12-05 DIAGNOSIS — Z6841 Body Mass Index (BMI) 40.0 and over, adult: Secondary | ICD-10-CM | POA: Diagnosis not present

## 2015-12-05 DIAGNOSIS — L089 Local infection of the skin and subcutaneous tissue, unspecified: Secondary | ICD-10-CM | POA: Diagnosis present

## 2015-12-05 DIAGNOSIS — F1010 Alcohol abuse, uncomplicated: Secondary | ICD-10-CM | POA: Diagnosis present

## 2015-12-05 DIAGNOSIS — L0231 Cutaneous abscess of buttock: Secondary | ICD-10-CM | POA: Diagnosis present

## 2015-12-05 DIAGNOSIS — K5900 Constipation, unspecified: Secondary | ICD-10-CM | POA: Diagnosis not present

## 2015-12-05 DIAGNOSIS — L4050 Arthropathic psoriasis, unspecified: Secondary | ICD-10-CM | POA: Diagnosis present

## 2015-12-05 DIAGNOSIS — L02419 Cutaneous abscess of limb, unspecified: Secondary | ICD-10-CM | POA: Diagnosis not present

## 2015-12-05 DIAGNOSIS — L03115 Cellulitis of right lower limb: Secondary | ICD-10-CM | POA: Diagnosis present

## 2015-12-05 DIAGNOSIS — D649 Anemia, unspecified: Secondary | ICD-10-CM | POA: Diagnosis not present

## 2015-12-05 DIAGNOSIS — L0291 Cutaneous abscess, unspecified: Secondary | ICD-10-CM | POA: Diagnosis present

## 2015-12-05 DIAGNOSIS — Z9884 Bariatric surgery status: Secondary | ICD-10-CM | POA: Diagnosis not present

## 2015-12-05 DIAGNOSIS — D509 Iron deficiency anemia, unspecified: Secondary | ICD-10-CM | POA: Diagnosis present

## 2015-12-05 DIAGNOSIS — Z96653 Presence of artificial knee joint, bilateral: Secondary | ICD-10-CM | POA: Diagnosis present

## 2015-12-05 DIAGNOSIS — N179 Acute kidney failure, unspecified: Secondary | ICD-10-CM | POA: Diagnosis present

## 2015-12-05 DIAGNOSIS — M79661 Pain in right lower leg: Secondary | ICD-10-CM | POA: Diagnosis not present

## 2015-12-05 DIAGNOSIS — L02415 Cutaneous abscess of right lower limb: Secondary | ICD-10-CM | POA: Diagnosis not present

## 2015-12-05 DIAGNOSIS — M79605 Pain in left leg: Secondary | ICD-10-CM | POA: Diagnosis present

## 2015-12-05 DIAGNOSIS — Z981 Arthrodesis status: Secondary | ICD-10-CM | POA: Diagnosis not present

## 2015-12-05 DIAGNOSIS — L02416 Cutaneous abscess of left lower limb: Secondary | ICD-10-CM | POA: Diagnosis not present

## 2015-12-05 LAB — CBC WITH DIFFERENTIAL/PLATELET
BASOS ABS: 0 10*3/uL (ref 0.0–0.1)
BASOS PCT: 0 %
Basophils Absolute: 0 10*3/uL (ref 0.0–0.1)
Basophils Relative: 0 %
EOS ABS: 0.6 10*3/uL (ref 0.0–0.7)
Eosinophils Absolute: 0.6 10*3/uL (ref 0.0–0.7)
Eosinophils Relative: 5 %
Eosinophils Relative: 7 %
HCT: 30.5 % — ABNORMAL LOW (ref 36.0–46.0)
HEMATOCRIT: 29.3 % — AB (ref 36.0–46.0)
HEMOGLOBIN: 9 g/dL — AB (ref 12.0–15.0)
Hemoglobin: 9.5 g/dL — ABNORMAL LOW (ref 12.0–15.0)
LYMPHS PCT: 23 %
Lymphocytes Relative: 25 %
Lymphs Abs: 2.3 10*3/uL (ref 0.7–4.0)
Lymphs Abs: 2.3 10*3/uL (ref 0.7–4.0)
MCH: 29.3 pg (ref 26.0–34.0)
MCH: 29.7 pg (ref 26.0–34.0)
MCHC: 30.7 g/dL (ref 30.0–36.0)
MCHC: 31.1 g/dL (ref 30.0–36.0)
MCV: 95.3 fL (ref 78.0–100.0)
MCV: 95.4 fL (ref 78.0–100.0)
MONOS PCT: 11 %
Monocytes Absolute: 1 10*3/uL (ref 0.1–1.0)
Monocytes Absolute: 1.3 10*3/uL — ABNORMAL HIGH (ref 0.1–1.0)
Monocytes Relative: 12 %
NEUTROS ABS: 5.2 10*3/uL (ref 1.7–7.7)
NEUTROS PCT: 57 %
Neutro Abs: 6.1 10*3/uL (ref 1.7–7.7)
Neutrophils Relative %: 60 %
Platelets: 337 10*3/uL (ref 150–400)
Platelets: 364 10*3/uL (ref 150–400)
RBC: 3.07 MIL/uL — AB (ref 3.87–5.11)
RBC: 3.2 MIL/uL — ABNORMAL LOW (ref 3.87–5.11)
RDW: 14.1 % (ref 11.5–15.5)
RDW: 14.2 % (ref 11.5–15.5)
WBC: 10.2 10*3/uL (ref 4.0–10.5)
WBC: 9.1 10*3/uL (ref 4.0–10.5)

## 2015-12-05 LAB — COMPREHENSIVE METABOLIC PANEL
ALK PHOS: 79 U/L (ref 38–126)
ALT: 12 U/L — AB (ref 14–54)
ALT: 11 U/L — AB (ref 14–54)
AST: 15 U/L (ref 15–41)
AST: 16 U/L (ref 15–41)
Albumin: 3 g/dL — ABNORMAL LOW (ref 3.5–5.0)
Albumin: 3 g/dL — ABNORMAL LOW (ref 3.5–5.0)
Alkaline Phosphatase: 78 U/L (ref 38–126)
Anion gap: 14 (ref 5–15)
Anion gap: 9 (ref 5–15)
BILIRUBIN TOTAL: 0.4 mg/dL (ref 0.3–1.2)
BUN: 10 mg/dL (ref 6–20)
BUN: 9 mg/dL (ref 6–20)
CALCIUM: 8.6 mg/dL — AB (ref 8.9–10.3)
CALCIUM: 8.7 mg/dL — AB (ref 8.9–10.3)
CO2: 28 mmol/L (ref 22–32)
CO2: 24 mmol/L (ref 22–32)
CREATININE: 1.06 mg/dL — AB (ref 0.44–1.00)
CREATININE: 0.92 mg/dL (ref 0.44–1.00)
Chloride: 98 mmol/L — ABNORMAL LOW (ref 101–111)
Chloride: 98 mmol/L — ABNORMAL LOW (ref 101–111)
GFR calc non Af Amer: 57 mL/min — ABNORMAL LOW (ref >60)
GFR calc non Af Amer: >60 mL/min (ref >60)
GLUCOSE: 103 mg/dL — AB (ref 65–99)
Glucose, Bld: 137 mg/dL — ABNORMAL HIGH (ref 65–99)
Potassium: 3.8 mmol/L (ref 3.5–5.1)
Potassium: 3.9 mmol/L (ref 3.5–5.1)
SODIUM: 135 mmol/L (ref 135–145)
SODIUM: 136 mmol/L (ref 135–145)
TOTAL PROTEIN: 6.2 g/dL — AB (ref 6.5–8.1)
Total Bilirubin: 0.4 mg/dL (ref 0.3–1.2)
Total Protein: 6.2 g/dL — ABNORMAL LOW (ref 6.5–8.1)

## 2015-12-05 LAB — IRON AND TIBC
IRON: 11 ug/dL — AB (ref 28–170)
SATURATION RATIOS: 4 % — AB (ref 10.4–31.8)
TIBC: 305 ug/dL (ref 250–450)
UIBC: 294 ug/dL

## 2015-12-05 LAB — LIPID PANEL
CHOL/HDL RATIO: 2 ratio
Cholesterol: 143 mg/dL (ref 0–200)
HDL: 73 mg/dL (ref >40)
LDL CALC: 58 mg/dL (ref 0–99)
TRIGLYCERIDES: 61 mg/dL (ref <150)
VLDL: 12 mg/dL (ref 0–40)

## 2015-12-05 LAB — RETICULOCYTES
RBC.: 3.07 MIL/uL — ABNORMAL LOW (ref 3.87–5.11)
RETIC COUNT ABSOLUTE: 43 10*3/uL (ref 19.0–186.0)
Retic Ct Pct: 1.4 % (ref 0.4–3.1)

## 2015-12-05 LAB — FERRITIN: FERRITIN: 66 ng/mL (ref 11–307)

## 2015-12-05 LAB — LACTIC ACID, PLASMA: LACTIC ACID, VENOUS: 0.7 mmol/L (ref 0.5–2.0)

## 2015-12-05 LAB — VITAMIN B12: Vitamin B-12: 704 pg/mL (ref 180–914)

## 2015-12-05 LAB — FOLATE: FOLATE: 25.9 ng/mL (ref >5.9)

## 2015-12-05 MED ORDER — DULOXETINE HCL 60 MG PO CPEP
60.0000 mg | ORAL_CAPSULE | Freq: Every day | ORAL | Status: DC
  Administered 2015-12-05 – 2015-12-13 (×9): 60 mg via ORAL
  Filled 2015-12-05 (×9): qty 1

## 2015-12-05 MED ORDER — VITAMIN B-1 100 MG PO TABS
100.0000 mg | ORAL_TABLET | Freq: Every day | ORAL | Status: DC
  Administered 2015-12-05 – 2015-12-09 (×5): 100 mg via ORAL
  Filled 2015-12-05 (×5): qty 1

## 2015-12-05 MED ORDER — KETOROLAC TROMETHAMINE 30 MG/ML IJ SOLN
30.0000 mg | Freq: Once | INTRAMUSCULAR | Status: AC
  Administered 2015-12-05: 30 mg via INTRAVENOUS
  Filled 2015-12-05: qty 1

## 2015-12-05 MED ORDER — SODIUM CHLORIDE 0.9 % IV SOLN
INTRAVENOUS | Status: DC
  Administered 2015-12-05 – 2015-12-06 (×2): via INTRAVENOUS
  Filled 2015-12-05: qty 1000

## 2015-12-05 MED ORDER — HYDROMORPHONE HCL 1 MG/ML IJ SOLN: 1.0000 mg | INTRAMUSCULAR | Status: DC | PRN

## 2015-12-05 MED ORDER — ENOXAPARIN SODIUM 80 MG/0.8ML ~~LOC~~ SOLN
65.0000 mg | SUBCUTANEOUS | Status: AC
Start: 2015-12-05 — End: 2015-12-05
  Administered 2015-12-05: 65 mg via SUBCUTANEOUS
  Filled 2015-12-05 (×2): qty 0.8

## 2015-12-05 MED ORDER — LORAZEPAM 1 MG PO TABS: 1.0000 mg | ORAL_TABLET | Freq: Four times a day (QID) | ORAL | Status: AC | PRN

## 2015-12-05 MED ORDER — THIAMINE HCL 100 MG/ML IJ SOLN
100.0000 mg | Freq: Every day | INTRAMUSCULAR | Status: DC
  Filled 2015-12-05: qty 2

## 2015-12-05 MED ORDER — HYDROCORTISONE 2.5 % RE CREA
TOPICAL_CREAM | Freq: Two times a day (BID) | RECTAL | Status: DC
  Administered 2015-12-06 – 2015-12-12 (×8): via RECTAL
  Filled 2015-12-05: qty 28.35

## 2015-12-05 MED ORDER — HYDROMORPHONE HCL 1 MG/ML IJ SOLN
1.0000 mg | INTRAMUSCULAR | Status: DC | PRN
  Administered 2015-12-05 – 2015-12-08 (×6): 1 mg via INTRAVENOUS
  Filled 2015-12-05 (×7): qty 1

## 2015-12-05 MED ORDER — FERROUS SULFATE 325 (65 FE) MG PO TABS
325.0000 mg | ORAL_TABLET | Freq: Every day | ORAL | Status: DC
  Administered 2015-12-06 – 2015-12-13 (×8): 325 mg via ORAL
  Filled 2015-12-05 (×8): qty 1

## 2015-12-05 MED ORDER — HYDROCODONE-ACETAMINOPHEN 5-325 MG PO TABS
1.0000 | ORAL_TABLET | Freq: Four times a day (QID) | ORAL | Status: DC | PRN
  Administered 2015-12-05 – 2015-12-07 (×6): 1 via ORAL
  Filled 2015-12-05 (×6): qty 1

## 2015-12-05 MED ORDER — ADULT MULTIVITAMIN W/MINERALS CH
1.0000 | ORAL_TABLET | Freq: Every day | ORAL | Status: DC
Start: 2015-12-05 — End: 2015-12-13
  Administered 2015-12-05 – 2015-12-13 (×9): 1 via ORAL
  Filled 2015-12-05 (×9): qty 1

## 2015-12-05 MED ORDER — VANCOMYCIN HCL IN DEXTROSE 1-5 GM/200ML-% IV SOLN
1000.0000 mg | Freq: Two times a day (BID) | INTRAVENOUS | Status: DC
  Administered 2015-12-05 – 2015-12-09 (×9): 1000 mg via INTRAVENOUS
  Filled 2015-12-05 (×10): qty 200

## 2015-12-05 MED ORDER — LORAZEPAM 2 MG/ML IJ SOLN: 0.0000 mg | Freq: Two times a day (BID) | INTRAMUSCULAR | Status: DC

## 2015-12-05 MED ORDER — FOLIC ACID 1 MG PO TABS
1.0000 mg | ORAL_TABLET | Freq: Every day | ORAL | Status: DC
  Administered 2015-12-05 – 2015-12-09 (×5): 1 mg via ORAL
  Filled 2015-12-05 (×5): qty 1

## 2015-12-05 MED ORDER — ENOXAPARIN SODIUM 40 MG/0.4ML ~~LOC~~ SOLN: 40.0000 mg | SUBCUTANEOUS | Status: DC

## 2015-12-05 MED ORDER — POLYETHYLENE GLYCOL 3350 17 G PO PACK
17.0000 g | PACK | Freq: Every day | ORAL | Status: DC | PRN
  Administered 2015-12-08: 17 g via ORAL
  Filled 2015-12-05: qty 1

## 2015-12-05 MED ORDER — LORAZEPAM 2 MG/ML IJ SOLN
0.0000 mg | Freq: Four times a day (QID) | INTRAMUSCULAR | Status: AC
  Administered 2015-12-06 (×3): 2 mg via INTRAVENOUS
  Filled 2015-12-05 (×3): qty 1

## 2015-12-05 MED ORDER — FLUOXETINE HCL 20 MG PO CAPS
20.0000 mg | ORAL_CAPSULE | Freq: Two times a day (BID) | ORAL | Status: DC
  Administered 2015-12-05 – 2015-12-13 (×17): 20 mg via ORAL
  Filled 2015-12-05 (×17): qty 1

## 2015-12-05 MED ORDER — ONDANSETRON HCL 4 MG/2ML IJ SOLN
4.0000 mg | Freq: Four times a day (QID) | INTRAMUSCULAR | Status: DC | PRN
  Administered 2015-12-06 – 2015-12-12 (×3): 4 mg via INTRAVENOUS
  Filled 2015-12-05 (×3): qty 2

## 2015-12-05 MED ORDER — LORAZEPAM 2 MG/ML IJ SOLN: 1.0000 mg | Freq: Four times a day (QID) | INTRAMUSCULAR | Status: AC | PRN

## 2015-12-05 MED ORDER — TRAZODONE HCL 50 MG PO TABS
50.0000 mg | ORAL_TABLET | Freq: Every day | ORAL | Status: DC
  Administered 2015-12-05 – 2015-12-12 (×9): 50 mg via ORAL
  Filled 2015-12-05 (×9): qty 1

## 2015-12-05 MED ORDER — FLUCONAZOLE 100 MG PO TABS
150.0000 mg | ORAL_TABLET | Freq: Once | ORAL | Status: AC
  Administered 2015-12-05: 150 mg via ORAL
  Filled 2015-12-05: qty 2

## 2015-12-05 NOTE — Consult Note (Signed)
WOC wound consult note Reason for Consult: LE ulcers; L thigh ulcer Seen by surgery for buttock, however this lesion really is just area of erythema and induration currently Chart reviewed, has had multiple I&D of lesions over her LE and left thigh Wound type: unclear etiology Measurement: R pretibial: 3cmx 5cm x 0.2cm L pretibial x 2 both only small incisional openings L upper thigh incision that has since closed Wound bed: R pretibial abscess but not able to express drainage, more like bulla but not fluid filled, drainage is yellow/thick with no odor Left pretibial areas with packing strip, drainage is serousanginous and the wounds continue to have induration, no odor Legt upper thigh with induration but erythema has mostly resolved Drainage (amount, consistency, odor) see above Periwound: LE with cellulitis, but based on marking appears to be less  Dressing procedure/placement/frequency: Continue 1/4" iodoform packing to the left pretibial wounds, change daily.  Use stick end of sterile applicator to get packing into sites to wick Xeroform gauze to the bullous type lesion on the right pretibial area, change daily.  Monitor buttock and left thigh for changes. Will need to teach patient or caregiver to change dressings, patient does not want to stay inpatient very long.   Would suggest dermatology follow up as outpatient.  Discussed POC with patient and bedside nurse.  Re consult if needed, will not follow at this time. Thanks  Robertson [employer]ough [employer], CWOCN [phone])

## 2015-12-05 NOTE — Consult Note (Signed)
[facility] Surgery Consult Note  [name] 14-Nov-1955  [id number].    Requesting MD: Dr. Tana Coast Chief Complaint/Reason for Consult: LE and buttock cellulitis/abscess  HPI:  60 y/o white female with multiple medical problems who has had 1 week of LE cellulitis and abscesses scattered to her LE, thighs, and buttock.  She's had multiple I&D's by urgent care and our ED physicians here.  She has been on several antibiotics without significant improvement.  She has a new hard area popping up on her left buttock.  We were asked to eval whether that area needed I&D.  She has a h/o staph on her face in the past from a tanning bed which resolved with antibiotics.  She has chronic venous stasis issues due to obesity and she sees a vein specialist as an outpatient.  No fevers/chills, no N/V/D, no trauma to site, no bug bites.   No precipitating/alleviating factors, no radicular pain.    ROS: All systems reviewed and otherwise negative except for as above  Family History  Problem Relation Age of Onset  . Heart disease Mother   . Cancer Father     Past Medical History  Diagnosis Date  . Depression   . Arthritis   . Seasonal allergies   . Anemia   . Obese   . Sleep apnea 2012    took test in [place]-told to use cpap-could not ude    Past Surgical History  Procedure Laterality Date  . Total knee revision  07/30/2010    right; with lateral release  . Anterior cervical decomp/discectomy fusion  05/09/2008    C5-6, C6-7; with arthrodesis also  . Rotator cuff repair w/ distal clavicle excision  01/06/2006    right  . 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 Total knee arthroplasty  11/07/2003    right  . Shoulder arthroscopy  2003    rt  . Knee arthroscopy  2004    rt and lt knee  . Shoulder arthroscopy  2003    rt  . Tonsillectomy    . Gastric bypass  2002    Social History:  reports that she quit smoking about 34 years ago. She does not have any smokeless tobacco history  on file. She reports that she drinks about 3.0 oz of alcohol per week. She reports that she does not use illicit drugs.  Allergies: No Known Allergies  Medications Prior to Admission  Medication Sig Dispense Refill  . amphetamine-dextroamphetamine (ADDERALL XR) 30 MG 24 hr capsule Take 30 mg by mouth 2 (two) times daily. Reported on 12/03/2015  0  . doxycycline (VIBRAMYCIN) 100 MG capsule Take 1 capsule (100 mg total) by mouth 2 (two) times daily. AVOID EXCESS SUN EXPOSURE WHILE ON THIS MEDICATION 20 capsule 0  . DULoxetine (CYMBALTA) 30 MG capsule Take 2 capsules by mouth daily.  1  . EPINEPHrine 0.3 mg/0.3 mL IJ SOAJ injection Inject 0.3 mLs (0.3 mg total) into the muscle once. 1 Device 2  . FLUoxetine (PROZAC) 20 MG capsule Take 20 mg by mouth 2 (two) times daily.     Marland Kitchen HYDROcodone-acetaminophen (NORCO) 5-325 MG tablet Take 1 tablet by mouth every 6 (six) hours as needed. 15 tablet 0  . sulfamethoxazole-trimethoprim (BACTRIM DS,SEPTRA DS) 800-160 MG tablet Take 1 tablet by mouth 2 (two) times daily. 20 tablet 0  . traZODone (DESYREL) 50 MG tablet TAKE 50 MG BY MOUTH DAILY AT BEDTIME AS NEEDED FOR SLEEP  Blood pressure 130/72, pulse 100, temperature 98.4 F (36.9 C), temperature source Oral, resp. rate 20, height _0  (1.676 m), weight 127.5 kg (281 lb 1.4 oz), SpO2 95 %. Physical Exam: General: pleasant, WD/WN white obese female who is laying in bed in NAD HEENT: head is normocephalic, atraumatic.  Sclera are noninjected.  Ears and nose without any masses or lesions.  Mouth is pink and moist Heart: regular, rate, and rhythm.  No obvious murmurs, gallops, or rubs noted.  Palpable pedal pulses bilaterally Lungs: CTAB, no wheezes, rhonchi, or rales noted.  Respiratory effort nonlabored Abd: soft, NT/ND, +BS, no masses, hernias, or organomegaly Skin: chronic LE venous stasis.  Edema, erythema and induration to her shins.  Right LE wound is weeping seropurulent drainage. Left leg has 2 I&D  sites with 1/4 inch wicks in place without significant drainage.  Left buttock s/p I&D, new 2cm nodule in left buttock without significant fluctuance. Psych: A&Ox3 with an appropriate affect.   Results for orders placed or performed during the hospital encounter of 12/04/15 (from the past 48 hour(s))  CBC with Differential     Status: Abnormal   Collection Time: 12/04/15 11:30 PM  Result Value Ref Range   WBC 10.2 4.0 - 10.5 K/uL   RBC 3.20 (L) 3.87 - 5.11 MIL/uL   Hemoglobin 9.5 (L) 12.0 - 15.0 g/dL   HCT 30.5 (L) 36.0 - 46.0 %   MCV 95.3 78.0 - 100.0 fL   MCH 29.7 26.0 - 34.0 pg   MCHC 31.1 30.0 - 36.0 g/dL   RDW 14.1 11.5 - 15.5 %   Platelets 364 150 - 400 K/uL   Neutrophils Relative % 60 %   Neutro Abs 6.1 1.7 - 7.7 K/uL   Lymphocytes Relative 23 %   Lymphs Abs 2.3 0.7 - 4.0 K/uL   Monocytes Relative 12 %   Monocytes Absolute 1.3 (H) 0.1 - 1.0 K/uL   Eosinophils Relative 5 %   Eosinophils Absolute 0.6 0.0 - 0.7 K/uL   Basophils Relative 0 %   Basophils Absolute 0.0 0.0 - 0.1 K/uL  Comprehensive metabolic panel     Status: Abnormal   Collection Time: 12/04/15 11:30 PM  Result Value Ref Range   Sodium 136 135 - 145 mmol/L   Potassium 3.9 3.5 - 5.1 mmol/L   Chloride 98 (L) 101 - 111 mmol/L   CO2 24 22 - 32 mmol/L   Glucose, Bld 103 (H) 65 - 99 mg/dL   BUN 9 6 - 20 mg/dL   Creatinine, Ser 0.92 0.44 - 1.00 mg/dL   Calcium 8.7 (L) 8.9 - 10.3 mg/dL   Total Protein 6.2 (L) 6.5 - 8.1 g/dL   Albumin 3.0 (L) 3.5 - 5.0 g/dL   AST 16 15 - 41 U/L   ALT 11 (L) 14 - 54 U/L   Alkaline Phosphatase 78 38 - 126 U/L   Total Bilirubin 0.4 0.3 - 1.2 mg/dL   GFR calc non Af Amer >60 >60 mL/min   GFR calc Af Amer >60 >60 mL/min    Comment: (NOTE) The eGFR has been calculated using the CKD EPI equation. This calculation has not been validated in all clinical situations. eGFR's persistently <60 mL/min signify possible Chronic Kidney Disease.    Anion gap 14 5 - 15  CBC WITH DIFFERENTIAL      Status: Abnormal   Collection Time: 12/05/15  4:08 AM  Result Value Ref Range   WBC 9.1 4.0 - 10.5 K/uL  RBC 3.07 (L) 3.87 - 5.11 MIL/uL   Hemoglobin 9.0 (L) 12.0 - 15.0 g/dL   HCT 29.3 (L) 36.0 - 46.0 %   MCV 95.4 78.0 - 100.0 fL   MCH 29.3 26.0 - 34.0 pg   MCHC 30.7 30.0 - 36.0 g/dL   RDW 14.2 11.5 - 15.5 %   Platelets 337 150 - 400 K/uL   Neutrophils Relative % 57 %   Neutro Abs 5.2 1.7 - 7.7 K/uL   Lymphocytes Relative 25 %   Lymphs Abs 2.3 0.7 - 4.0 K/uL   Monocytes Relative 11 %   Monocytes Absolute 1.0 0.1 - 1.0 K/uL   Eosinophils Relative 7 %   Eosinophils Absolute 0.6 0.0 - 0.7 K/uL   Basophils Relative 0 %   Basophils Absolute 0.0 0.0 - 0.1 K/uL  Comprehensive metabolic panel     Status: Abnormal   Collection Time: 12/05/15  4:08 AM  Result Value Ref Range   Sodium 135 135 - 145 mmol/L   Potassium 3.8 3.5 - 5.1 mmol/L   Chloride 98 (L) 101 - 111 mmol/L   CO2 28 22 - 32 mmol/L   Glucose, Bld 137 (H) 65 - 99 mg/dL   BUN 10 6 - 20 mg/dL   Creatinine, Ser 1.06 (H) 0.44 - 1.00 mg/dL   Calcium 8.6 (L) 8.9 - 10.3 mg/dL   Total Protein 6.2 (L) 6.5 - 8.1 g/dL   Albumin 3.0 (L) 3.5 - 5.0 g/dL   AST 15 15 - 41 U/L   ALT 12 (L) 14 - 54 U/L   Alkaline Phosphatase 79 38 - 126 U/L   Total Bilirubin 0.4 0.3 - 1.2 mg/dL   GFR calc non Af Amer 57 (L) >60 mL/min   GFR calc Af Amer >60 >60 mL/min    Comment: (NOTE) The eGFR has been calculated using the CKD EPI equation. This calculation has not been validated in all clinical situations. eGFR's persistently <60 mL/min signify possible Chronic Kidney Disease.    Anion gap 9 5 - 15  Iron and TIBC     Status: Abnormal   Collection Time: 12/05/15  4:08 AM  Result Value Ref Range   Iron 11 (L) 28 - 170 ug/dL   TIBC 305 250 - 450 ug/dL   Saturation Ratios 4 (L) 10.4 - 31.8 %   UIBC 294 ug/dL  Ferritin     Status: None   Collection Time: 12/05/15  4:08 AM  Result Value Ref Range   Ferritin 66 11 - 307 ng/mL  Folate      Status: None   Collection Time: 12/05/15  4:08 AM  Result Value Ref Range   Folate 25.9 >5.9 ng/mL  Reticulocytes     Status: Abnormal   Collection Time: 12/05/15  4:08 AM  Result Value Ref Range   Retic Ct Pct 1.4 0.4 - 3.1 %   RBC. 3.07 (L) 3.87 - 5.11 MIL/uL   Retic Count, Manual 43.0 19.0 - 186.0 K/uL  Vitamin B12     Status: None   Collection Time: 12/05/15  4:08 AM  Result Value Ref Range   Vitamin B-12 704 180 - 914 pg/mL    Comment: (NOTE) This assay is not validated for testing neonatal or myeloproliferative syndrome specimens for Vitamin B12 levels.    No results found.    Assessment/Plan Chronic LE venous stasis with abscesses s/p I&D Multiple other areas of subcutaneous abscess s/p I&D Left buttock infection  Plan: 1.  No acute  surgical needs at this time.  Nothing to drain on left buttock.  Needs continued IV antibiotics, follow cultures. 2.  Heat packs, dressing changes to I&D sites/packing changes 3.  WOC consult for possible unaboots, may need vascular consult 4.  NPO after MN will re-eval left buttock in am to see if it needs surgical drainage,  Hopefully vanco will help   Nat Christen, [facility] Surgery 12/05/2015, 11:27 AM Pager: [phone]

## 2015-12-05 NOTE — Progress Notes (Signed)
Family Medicine Teaching Service Daily Progress Note Intern Pager: [phone]  Patient name: [name] Medical record number: [id number] Date of birth: 16-Jan-1956 Age: 60 y.o. Gender: female  Primary Care Provider: Norberto Sorenson, MD Consultants: Surgery, WOC Code Status: Full  Assessment and Plan: [name] is a 60yo female presenting with lower extremity swelling and redness. PMH includes depression, osteoarthritis, anemia, OSA (not on CPAP), obesity s/p gastric bypass surgery in 2002, venous insufficiency  # Cellulitis with Abscesses: Bilateral lower extremities, right worse than left. Multiple I&Ds performed. Failed courses of Augmentin, Doxycycline, and Bactrim. qSOFA score 0. Afebrile. Vitals stable.  - Admitted to [facility] Medicine Teaching Service, [name] attending. - Monitor vitals - Area of cellulitis demarcated. Monitor daily for improvement. - Obtain HIV labs, Lactic Acid - Will obtain blood cultures x2 given fevers, initial tachycardia to 109. Note that IV antibiotics were initiated prior to transfer of care and before cultures. - Follow up wound culture - Consider ABIs or Dopplers of lower extremity if no improvement. Pulses palpable with no calf tenderness and negative Homan's, so will not obtain at this time. - Surgery consulted at admission. No further I&D at this time.  - NPO after midnight to re-evaluate need for I&D in the morning  - Continue antibiotics, heat packs - WOC consulted - Vancomycin (Day #1)  # Elevated Creatinine: Creatinine elevated to 1.06 at admission, baseline 0.9. Not diagnostic of Acute Kidney Injury at this time. - Continue to monitor - MIVF, NS at 150cc/hr. Consider discontinuing after creatinine returns to baseline.  # Hyperglycemia: Glucose elevated to 137. - Obtain A1C and Lipid Panel - Continue to monitor. Consider initiating sliding scale if remains elevated.   # Anemia: Hemoglobin 9 at admission. Anemia panel shows iron low at 11 and  saturation low at 4, indicating iron deficiency as etiology of anemia. - Initiate Ferrous sulfate  - Miralax PRN constipation  # History of Alcohol Abuse: Drinks 6 pack of beer daily. - CIWA  # Depression:  Home medications include Cymbalta , Fluoxetine , and Trazodone . - Continue home medications.  FEN/GI: Heart Healthy, NPO after Midnight, NS@150  PPx: Lovenox  Disposition: Home pending clinical improvement  Subjective:  Initially presented this morning and was admitted by [facility]. Care transferred to [facility] Medicine Teaching Service.  Presented to [facility] on 2/7 for evaluation of venous insufficiency and was noted to have cellulitic changes and was initiated on Augmentin. Followed up at Urgent Care on 2/9 and cellulitis was noted to be worsening and abscesses forming. Was found to have one draining abscess on right leg and two non-draining abscesses on left leg. Three abscesses were incised and drained and 1gram of Rocephin given. Initiated Bactrim and Doxycyclin. Again followed up with Urgent Care on 2/10 and no change in cellulitis noted. Two new abscesses noted on left thigh. Was sent to ED for further evaluation.   In the ED, reported erythema and swelling has been present in bilateral lower extremities for approximately one week. Continues to note low grade fevers. Prior to this, she has not had any history of prior abscesses. Incision and drainage of left thigh abscess completed in ED.   Drinks six pack of beer every night. Prior smoker. Cares for child with Autism at home and wants to limit length of hospital stay if possible.   Objective: Temp:  [98.4 F (36.9 C)-99.3 F (37.4 C)] 98.4 F (36.9 C) (02/11 0600) Pulse Rate:  [84-109] 100 (02/11 0953) Resp:  [17-20] 20 (02/11  0600) BP: (112-165)/(69-101) 130/72 mmHg (02/11 0600) SpO2:  [94 %-97 %] 95 % (02/11 0600) Weight:  [281 lb 1.4 oz (127.5 kg)-287 lb (130.182 kg)] 281 lb 1.4 oz (127.5  kg) (02/11 0137) Physical Exam: General: 59yo female resting comfortably in no apparent distress Cardiovascular: S1 and S2 noted, no murmurs, regular rate and rhythm, pulses palpable Respiratory: Clear to auscultation bilaterally, no wheezes, no increased work of breathing Abdomen: Bowel sounds noted, soft and nondistended, no tenderness to palpation Extremities: Pitting edema noted, Homan's negative Skin: Increased warmth and erythema of bilateral lower extremities, multiple draining abscesses noted as well as nonfluctuant nondraining nodules in upper left thigh and left buttock  Laboratory:  Recent Labs Lab 12/03/15 1724 12/04/15 2330 12/05/15 0408  WBC 11.0* 10.2 9.1  HGB 10.4* 9.5* 9.0*  HCT 31.7* 30.5* 29.3*  PLT  --  364 337    Recent Labs Lab 12/04/15 2330 12/05/15 0408  NA 136 135  K 3.9 3.8  CL 98* 98*  CO2 24 28  BUN 9 10  CREATININE 0.92 1.06*  CALCIUM 8.7* 8.6*  PROT 6.2* 6.2*  BILITOT 0.4 0.4  ALKPHOS 78 79  ALT 11* 12*  AST 16 15  GLUCOSE 103* 137*  - Anemia Panel: Iron 11, TIBC 305, Saturation 4, Ferritin [address] Altha, DO 12/05/2015, 10:50 AM PGY-2, [place] [facility] FPTS Intern pager: [phone], text pages welcome

## 2015-12-05 NOTE — H&P (Signed)
[facility] History and Physical  [name] JWJ:[id number] DOB: 1955-12-01 DOA: 12/04/2015  Referring physician: Benjiman Core, M.D. PCP: Norberto Sorenson, MD   Chief Complaint: Lower extremity swelling and redness.  HPI: [name] is a 60 y.o. female with a past medical history of depression, osteoarthritis, seasonal allergies, anemia, sleep apnea not on CPAP, morbid obesity is status post gastric bypass surgery who comes to the emergency department due to above chief complaint since last week.  Per patient, she started noticing increasing edema since last weekend. This was followed by the development of erythema, calor and tenderness in the area area the patient states that she has had low-grade temperatures at home as well. She has been seen 3 different diuretics by either her primary care providers or urgent care center. Some of the abscesses area underwent I&D yesterday and she was started on doxycycline and Bactrim earlier this week without significant relief since then.   She has multiple lesions on both lower extremities, but she states that she got concerned when she developed a new area of infection on her left upper thigh. This area was incised and drained by Dr. Rubin Payor earlier. She is currently in no acute distress.   Review of Systems:  Constitutional:  Positive, Fevers, chills, fatigue.  No weight loss, night sweats HEENT:  No headaches, Difficulty swallowing,Tooth/dental problems,Sore throat,  No sneezing, itching, ear ache, nasal congestion, post nasal drip,  Cardio-vascular:  Positive swelling in lower extremities. No chest pain, Orthopnea, PND,  anasarca, dizziness, palpitations  GI:  No heartburn, indigestion, abdominal pain, nausea, vomiting, diarrhea, change in bowel habits, loss of appetite  Resp:  No shortness of breath with exertion or at rest. No excess mucus, no productive cough, No non-productive cough, No coughing up of blood.No change in color  of mucus.No wheezing.No chest wall deformity  Skin:  Multiple erythematosus, tender lesions on both lower extremities. GU:  no dysuria, change in color of urine, no urgency or frequency. No flank pain.  Musculoskeletal:  No joint pain or swelling. No decreased range of motion. No back pain.  Psych:  History of depression. No memory loss.   Past Medical History  Diagnosis Date  . Depression   . Arthritis   . Seasonal allergies   . Anemia   . Obese   . Sleep apnea 2012    took test in [place]-told to use cpap-could not ude   Past Surgical History  Procedure Laterality Date  . Total knee revision  07/30/2010    right; with lateral release  . Anterior cervical decomp/discectomy fusion  05/09/2008    C5-6, C6-7; with arthrodesis also  . Rotator cuff repair w/ distal clavicle excision  01/06/2006    right  . 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 Total knee arthroplasty  11/07/2003    right  . Shoulder arthroscopy  2003    rt  . Knee arthroscopy  2004    rt and lt knee  . Shoulder arthroscopy  2003    rt  . Tonsillectomy    . Gastric bypass  2002   Social History:  reports that she quit smoking about 34 years ago. She does not have any smokeless tobacco history on file. She reports that she drinks about 3.0 oz of alcohol per week. She reports that she does not use illicit drugs.  No Known Allergies  Family History  Problem Relation Age of Onset  . Heart disease Mother   .  Cancer Father     Prior to Admission medications   Medication Sig Start Date End Date Taking? Authorizing Provider  amphetamine-dextroamphetamine (ADDERALL XR) 30 MG 24 hr capsule Take 30 mg by mouth 2 (two) times daily. Reported on 12/03/2015 03/06/15  Yes Historical Provider, MD  doxycycline (VIBRAMYCIN) 100 MG capsule Take 1 capsule (100 mg total) by mouth 2 (two) times daily. AVOID EXCESS SUN EXPOSURE WHILE ON THIS MEDICATION 12/03/15 12/13/15 Yes Lanier Clam V, PA-C  DULoxetine (CYMBALTA) 30 MG  capsule Take 2 capsules by mouth daily. 02/12/15  Yes Historical Provider, MD  EPINEPHrine 0.3 mg/0.3 mL IJ SOAJ injection Inject 0.3 mLs (0.3 mg total) into the muscle once. 05/07/15  Yes Lorre Nick, MD  FLUoxetine (PROZAC) 20 MG capsule Take 20 mg by mouth 2 (two) times daily.    Yes Historical Provider, MD  HYDROcodone-acetaminophen (NORCO) 5-325 MG tablet Take 1 tablet by mouth every 6 (six) hours as needed. 12/03/15  Yes Lanier Clam V, PA-C  sulfamethoxazole-trimethoprim (BACTRIM DS,SEPTRA DS) 800-160 MG tablet Take 1 tablet by mouth 2 (two) times daily. 12/03/15  Yes Lanier Clam V, PA-C  traZODone (DESYREL) 50 MG tablet TAKE 50 MG BY MOUTH DAILY AT BEDTIME AS NEEDED FOR SLEEP 12/11/14  Yes Historical Provider, MD   Physical Exam: Filed Vitals:   12/05/15 0015 12/05/15 0030 12/05/15 0100 12/05/15 0137  BP:  125/75 129/76 141/69  Pulse: 96 93 91 90  Temp:    98.8 F (37.1 C)  TempSrc:    Oral  Resp:    20  Height:      Weight:    127.5 kg (281 lb 1.4 oz)  SpO2: 94% 94% 94% 96%    Wt Readings from Last 3 Encounters:  12/05/15 127.5 kg (281 lb 1.4 oz)  12/04/15 130.182 kg (287 lb)  12/03/15 128.368 kg (283 lb)    General:  Appears calm and comfortable Eyes: PERRL, normal lids, irises & conjunctiva ENT: grossly normal hearing, lips & tongue Neck: no LAD, masses or thyromegaly Cardiovascular: RRR, no m/r/g. 2+ LE edema. Telemetry: SR, no arrhythmias  Respiratory: CTA bilaterally, no w/r/r. Normal respiratory effort. Abdomen: soft, ntnd Skin: Positive erythema, calor, edema bilaterally on LLE. Several areas covered by dressings, were her wounds were incised and drained.  Musculoskeletal: grossly normal tone BUE/BLE Psychiatric: grossly normal mood and affect, speech fluent and appropriate Neurologic: Awake, alert, oriented 3, grossly non-focal.          Labs on Admission:  Basic Metabolic Panel:  Recent Labs Lab 12/04/15 2330  NA 136  K 3.9  CL 98*  CO2 24  GLUCOSE  103*  BUN 9  CREATININE 0.92  CALCIUM 8.7*   Liver Function Tests:  Recent Labs Lab 12/04/15 2330  AST 16  ALT 11*  ALKPHOS 78  BILITOT 0.4  PROT 6.2*  ALBUMIN 3.0*   CBC:  Recent Labs Lab 12/03/15 1724 12/04/15 2330  WBC 11.0* 10.2  NEUTROABS  --  6.1  HGB 10.4* 9.5*  HCT 31.7* 30.5*  MCV 93.7 95.3  PLT  --  364     Assessment/Plan Principal Problem:   Cellulitis of both lower extremities   Cellulitis and abscess Admit to MedSurg. Continue IV antibiotic therapy per pharmacy. Continue analgesics as needed. Follow wound culture and sensitivity.  Active Problems:   Alcohol abuse  per patient, she drinks a sixpack every night. No signs of impending alcohol withdrawal at this time, but I will start CiWA protocol.  Depression Continue Cymbalta 60 mg by mouth daily Continue fluoxetine 20 mg by mouth daily. Continue trazodone 50 mg by mouth daily at bedtime as needed    Chronic anemia Check anemia profile Monitor hematocrit and hemoglobin.    Code Status: Full code. DVT Prophylaxis: Lovenox SQ.  Family Communication: Her husband was present in the room. Disposition Plan: Admit for IV antibiotic therapy and pain management.  Time spent: Over 70 minutes were spent in the process of his admission.  Bobette Mo [facility] Pager [phone].

## 2015-12-05 NOTE — Plan of Care (Signed)
Patient was admitted earlier this morning by [facility] nocturnist. Patient is an established patient of [facility]. Transferred to family medicine teaching service. Discussed with Dr. Caroleen Hamman. TRH will sign off.   Loyd Salvador M.D. [facility] 12/05/2015, 10:40 AM

## 2015-12-06 DIAGNOSIS — L02419 Cutaneous abscess of limb, unspecified: Secondary | ICD-10-CM | POA: Insufficient documentation

## 2015-12-06 LAB — CBC
HCT: 31 % — ABNORMAL LOW (ref 36.0–46.0)
Hemoglobin: 9.7 g/dL — ABNORMAL LOW (ref 12.0–15.0)
MCH: 29.8 pg (ref 26.0–34.0)
MCHC: 31.3 g/dL (ref 30.0–36.0)
MCV: 95.1 fL (ref 78.0–100.0)
PLATELETS: 373 10*3/uL (ref 150–400)
RBC: 3.26 MIL/uL — AB (ref 3.87–5.11)
RDW: 14 % (ref 11.5–15.5)
WBC: 9.1 10*3/uL (ref 4.0–10.5)

## 2015-12-06 LAB — BASIC METABOLIC PANEL
Anion gap: 11 (ref 5–15)
BUN: 8 mg/dL (ref 6–20)
CHLORIDE: 104 mmol/L (ref 101–111)
CO2: 24 mmol/L (ref 22–32)
CREATININE: 0.99 mg/dL (ref 0.44–1.00)
Calcium: 8.8 mg/dL — ABNORMAL LOW (ref 8.9–10.3)
GFR calc non Af Amer: >60 mL/min (ref >60)
GLUCOSE: 118 mg/dL — AB (ref 65–99)
Potassium: 4 mmol/L (ref 3.5–5.1)
Sodium: 139 mmol/L (ref 135–145)

## 2015-12-06 LAB — WOUND CULTURE
GRAM STAIN: NONE SEEN
Gram Stain: NONE SEEN
ORGANISM ID, BACTERIA: NO GROWTH

## 2015-12-06 LAB — HIV ANTIBODY (ROUTINE TESTING W REFLEX): HIV Screen 4th Generation wRfx: NONREACTIVE

## 2015-12-06 MED ORDER — PIPERACILLIN-TAZOBACTAM 3.375 G IVPB
3.3750 g | Freq: Three times a day (TID) | INTRAVENOUS | Status: DC
  Administered 2015-12-06 – 2015-12-09 (×9): 3.375 g via INTRAVENOUS
  Filled 2015-12-06 (×10): qty 50

## 2015-12-06 MED ORDER — WHITE PETROLATUM GEL
Status: AC
Start: 2015-12-06 — End: 2015-12-06
  Administered 2015-12-06: 0.2
  Filled 2015-12-06: qty 1

## 2015-12-06 MED ORDER — BENZONATATE 100 MG PO CAPS
200.0000 mg | ORAL_CAPSULE | Freq: Two times a day (BID) | ORAL | Status: DC | PRN
  Administered 2015-12-06 – 2015-12-09 (×5): 200 mg via ORAL
  Filled 2015-12-06 (×5): qty 2

## 2015-12-06 MED ORDER — ALBUTEROL SULFATE (2.5 MG/3ML) 0.083% IN NEBU
2.5000 mg | INHALATION_SOLUTION | RESPIRATORY_TRACT | Status: DC | PRN
Start: 2015-12-06 — End: 2015-12-13
  Administered 2015-12-08: 2.5 mg via RESPIRATORY_TRACT
  Filled 2015-12-06: qty 3

## 2015-12-06 MED ORDER — ENOXAPARIN SODIUM 60 MG/0.6ML ~~LOC~~ SOLN
60.0000 mg | SUBCUTANEOUS | Status: DC
  Administered 2015-12-06 – 2015-12-13 (×8): 60 mg via SUBCUTANEOUS
  Filled 2015-12-06 (×8): qty 0.6

## 2015-12-06 MED ORDER — PIPERACILLIN-TAZOBACTAM 3.375 G IVPB 30 MIN
3.3750 g | Freq: Once | INTRAVENOUS | Status: AC
  Administered 2015-12-06: 3.375 g via INTRAVENOUS
  Filled 2015-12-06: qty 50

## 2015-12-06 NOTE — Progress Notes (Addendum)
Pharmacy Antibiotic Note  [name] is a 60 y.o. female admitted on 12/04/2015 with cellulitis with abscess.  Pharmacy has been consulted for vancomycin and zosyn dosing.  Vanc day #2, to add zosyn today.  WBC 9.1, creat 0.99, AF, wt 127.5 kg. First dose of zosyn 3.375 gm IV over 30 minutes ordered by MD.   Plan:Vancomycin 1000 IV every 12 hours.  Goal trough 10-15 mcg/mL. Zosyn 3.375g IV q8h (4 hour infusion).  I resumed LMWH for VTE px since so sx planned  Height:  (167.6 cm) Weight: 281 lb 1.4 oz (127.5 kg) IBW/kg (Calculated) : 59.3  Temp (24hrs), Avg:98.4 F (36.9 C), Min:98.2 F (36.8 C), Max:98.7 F (37.1 C)   Recent Labs Lab 12/03/15 1724 12/04/15 2330 12/05/15 0408 12/05/15 1403 12/06/15 0549  WBC 11.0* 10.2 9.1  --  9.1  CREATININE  --  0.92 1.06*  --  0.99  LATICACIDVEN  --   --   --  0.7  --     Estimated Creatinine Clearance: 84.7 mL/min (by C-G formula based on Cr of 0.99).    No Known Allergies  Antimicrobials this admission:  Vanc 2/11>> Zosyn 2/12>> Diflucan 150 x 1 2/11  Dose adjustments this admission: none Microbiology results:  2/11 BCx2>> 2/10 wound cx>> 2/10 wound cx gram stain: ngtd  Thank you for allowing pharmacy to be a part of this patient's care. Herby Abraham, Pharm.D. [phone] 12/06/2015 9:50 AM

## 2015-12-06 NOTE — Progress Notes (Signed)
  Subjective: Complains of tenderness at several sites.   Objective: Vital signs in last 24 hours: Temp:  [98.2 F (36.8 C)-98.7 F (37.1 C)] 98.2 F (36.8 C) (02/12 0518) Pulse Rate:  [93-100] 93 (02/12 0518) Resp:  [17-18] 17 (02/12 0518) BP: (132-151)/(66-91) 142/68 mmHg (02/12 0518) SpO2:  [97 %-98 %] 97 % (02/12 0518) Last BM Date: 12/05/15  Intake/Output from previous day: 02/11 0701 - 02/12 0700 In: 3330 [P.O.:1080; I.V.:2250] Out: -  Intake/Output this shift:    Resp: clear to auscultation bilaterally Cardio: regular rate and rhythm GI: soft, non-tender; bowel sounds normal; no masses,  no organomegaly Extremities: induration of left hip. no fluctuance  Lab Results:   Recent Labs  12/05/15 0408 12/06/15 0549  WBC 9.1 9.1  HGB 9.0* 9.7*  HCT 29.3* 31.0*  PLT 337 373   BMET  Recent Labs  12/05/15 0408 12/06/15 0549  NA 135 139  K 3.8 4.0  CL 98* 104  CO2 28 24  GLUCOSE 137* 118*  BUN 10 8  CREATININE 1.06* 0.99  CALCIUM 8.6* 8.8*   PT/INR No results for input(s): LABPROT, INR in the last 72 hours. ABG No results for input(s): PHART, HCO3 in the last 72 hours.  Invalid input(s): PCO2, PO2  Studies/Results: No results found.  Anti-infectives: Anti-infectives    Start     Dose/Rate Route Frequency Ordered Stop   12/05/15 1030  fluconazole (DIFLUCAN) tablet 150 mg     150 mg Oral  Once 12/05/15 1028 12/05/15 1103   12/05/15 1000  vancomycin (VANCOCIN) IVPB 1000 mg/200 mL premix     1,000 mg 200 mL/hr over 60 Minutes Intravenous Every 12 hours 12/05/15 0121     12/04/15 2245  vancomycin (VANCOCIN) 2,000 mg in sodium chloride 0.9 % 500 mL IVPB     2,000 mg 250 mL/hr over 120 Minutes Intravenous  Once 12/04/15 2236 12/05/15 0059      Assessment/Plan: s/p * No surgery found * Continue abx and observe. No plan for surgery on left hip at this point  LOS: 1 day    TOTH III,Taneeka Curtner S 12/06/2015

## 2015-12-06 NOTE — Progress Notes (Signed)
Family Medicine Teaching Service Daily Progress Note Intern Pager: [phone]  Patient name: [name] Medical record number: [id number] Date of birth: 06-Mar-1956 Age: 60 y.o. Gender: female  Primary Care Provider: Norberto Sorenson, MD Consultants: Surgery, WOC Code Status: Full  Assessment and Plan: Mrs. Waldren is a 60yo female presenting with lower extremity swelling and redness. PMH includes depression, osteoarthritis, anemia, OSA (not on CPAP), obesity s/p gastric bypass surgery in 2002, venous insufficiency  # Cellulitis with Abscesses: Bilateral lower extremities, right worse than left. Multiple I&Ds performed. Failed courses of Augmentin, Doxycycline, and Bactrim. qSOFA score 0. Lactic Acid normal. HIV nonreactive. Afebrile. Vitals stable.  - Monitor vitals - Area of cellulitis demarcated. Monitor daily for improvement. - Follow up blood cultures, wound culture - Consider ABIs or Dopplers of lower extremity if no improvement. Pulses palpable with no calf tenderness and negative Homan's, so will not obtain at this time. - Surgery consulted at admission. No further I&D at this time. Continue antibiotics, heat packs - WOC consulted. Recommend dermatology follow up as outpatient. - Vancomycin (Day #2), Consider initiating Zosyn  # Elevated Creatinine: Creatinine elevated to 1.06 at admission, baseline 0.9.  - Creatinine improved to 0.99 - Continue to monitor - Discontinue IV fluids  # Hyperglycemia: Glucose elevated to 137. - Follow up A1C  - Continue to monitor. Consider initiating sliding scale if remains elevated.   # Anemia: Hemoglobin 9 at admission. Anemia panel shows iron low at 11 and saturation low at 4, indicating iron deficiency as etiology of anemia. - Hemoglobin 9.7 - Initiate Ferrous sulfate  - Miralax PRN constipation  # History of Alcohol Abuse: Drinks 6 pack of beer daily. - CIWA  # Depression:  Home medications include Cymbalta , Fluoxetine , and  Trazodone . - Continue home medications.  FEN/GI: Heart Healthy, NPO after Midnight, NS@150  PPx: Lovenox  Disposition: Home pending clinical improvement  Subjective:  Notes little improvement in pain, swelling, or redness. Denies fever. Notes cough and requests medication to help with this. No further concerns.  Objective: Temp:  [98.2 F (36.8 C)-98.7 F (37.1 C)] 98.2 F (36.8 C) (02/12 0518) Pulse Rate:  [93-100] 93 (02/12 0518) Resp:  [17-18] 17 (02/12 0518) BP: (132-151)/(66-91) 142/68 mmHg (02/12 0518) SpO2:  [97 %-98 %] 97 % (02/12 0518) Physical Exam: General: 59yo female resting comfortably in no apparent distress Cardiovascular: S1 and S2 noted, no murmurs, regular rate and rhythm, pulses palpable Respiratory: Clear to auscultation bilaterally, no wheezes, no increased work of breathing Abdomen: Bowel sounds noted, soft and nondistended, no tenderness to palpation Extremities: Pitting edema noted, Homan's negative Skin: Warmth, erythema, and edema of bilateral lower extremities; mild improvement in left lower extremity but minimal improvement in right; multiple draining abscesses noted as well as nonfluctuant nondraining nodules in upper left thigh and left buttock  Laboratory:  Recent Labs Lab 12/04/15 2330 12/05/15 0408 12/06/15 0549  WBC 10.2 9.1 9.1  HGB 9.5* 9.0* 9.7*  HCT 30.5* 29.3* 31.0*  PLT 364 337 373    Recent Labs Lab 12/04/15 2330 12/05/15 0408 12/06/15 0549  NA 136 135 139  K 3.9 3.8 4.0  CL 98* 98* 104  CO2 BUN CREATININE 0.92 1.06* 0.99  CALCIUM 8.7* 8.6* 8.8*  PROT 6.2* 6.2*  --   BILITOT 0.4 0.4  --   ALKPHOS 78 79  --   ALT 11* 12*  --   AST 16 15  --   GLUCOSE  103* 137* 118*  - Anemia Panel: Iron 11, TIBC 305, Saturation 4, Ferritin [address] [place], DO 12/06/2015, 8:14 AM PGY-2, [facility] FPTS Intern pager: [phone], text pages welcome

## 2015-12-07 LAB — WOUND CULTURE: Culture: NO GROWTH

## 2015-12-07 LAB — HEMOGLOBIN A1C
HEMOGLOBIN A1C: 5.4 % (ref 4.8–5.6)
Mean Plasma Glucose: 108 mg/dL

## 2015-12-07 MED ORDER — HYDROCODONE-ACETAMINOPHEN 10-325 MG PO TABS
1.0000 | ORAL_TABLET | Freq: Four times a day (QID) | ORAL | Status: DC | PRN
  Administered 2015-12-07 – 2015-12-13 (×19): 1 via ORAL
  Filled 2015-12-07 (×20): qty 1

## 2015-12-07 NOTE — Progress Notes (Signed)
Family Medicine Teaching Service Daily Progress Note Intern Pager: [phone]  Patient name: [name] Medical record number: [id number] Date of birth: 07-25-1956 Age: 60 y.o. Gender: female  Primary Care Provider: Norberto Sorenson, MD Consultants: Surgery, WOC Code Status: Full  Assessment and Plan: [name] is a 60yo female presenting with lower extremity swelling and redness. PMH includes depression, osteoarthritis, anemia, OSA (not on CPAP), obesity s/p gastric bypass surgery in 2002, venous insufficiency  # Cellulitis with Abscesses: Bilateral lower extremities, right worse than left. Multiple I&Ds performed. Failed courses of Augmentin, Doxycycline, and Bactrim. qSOFA score 0. Lactic Acid normal. HIV nonreactive. Surgery consulted, who recommended no further I&Ds at this time, and continuing abx with heating packs  WOC recommended outpatient dermatology f/u. Remains afebrile with stable vital signs. Surgery to reevaluate L buttock tomorrow to reconsider surgical drainage.  - Area of cellulitis demarcated. Monitor daily for improvement. - F/u blood cx - NGx1d - F/u wound cx - NGx1d - Consider ABIs or Dopplers of lower extremity if no improvement. Pulses palpable with no calf tenderness and negative Homan's, so will not obtain at this time. - Cont vancomycin (2/10>), Zosyn (2/12>) - Surgery consulted - appreciate recs  # Elevated Creatinine: Creatinine elevated to 1.06 at admission, baseline 0.9. Improved to 0.99 2/12. - Continue to monitor - Discontinue IV fluids  # Hyperglycemia: Glucose elevated to 137. Improved to 118 overnight.  - Follow up A1C  - Continue to monitor. Consider initiating sliding scale if remains elevated.   # Anemia: Hgb 9 at admission. Anemia panel shows iron low at 11 and saturation low at 4, indicating iron deficiency as etiology of anemia. Hgb increased to 9.7 this AM.  - Initiate Ferrous sulfate  - Miralax PRN constipation  # History of Alcohol Abuse:  Drinks 6 pack of beer daily. CIWA scores 9 and 7 overnight (primarily due to severe HA).  - CIWA  # Depression:  Home medications include Cymbalta , Fluoxetine , and Trazodone . - Continue home medications.  FEN/GI: Heart Healthy, NS@150 ; NPO after MN PPx: Lovenox  Disposition: Home pending clinical improvement  Subjective:  No acute events overnight. Patient reports HA this AM, but has no complaints otherwise.   Objective: Temp:  [97.9 F (36.6 C)-98.4 F (36.9 C)] 97.9 F (36.6 C) (02/13 0531) Pulse Rate:  [85-101] 85 (02/13 0600) Resp:  [17-18] 18 (02/13 0531) BP: (132-150)/(73-80) 132/79 mmHg (02/13 0531) SpO2:  [96 %-98 %] 98 % (02/13 0531) Physical Exam: General: resting comfortably in bed in NAD Cardiovascular: Distant heart sounds likely 2/2 habitus; normal rate, no murmurs appreciated Respiratory: Scattered wheezes throughout lung fields, good air movement, normal work of breathing Abdomen: Soft, non-tender, non-distended, +BS Extremities: Pitting edema noted, Homan's negative Skin: Warmth, erythema, and edema of bilateral lower extremities R worse than L; multiple draining abscesses noted as well as nonfluctuant nondraining nodules in upper left thigh and left buttock  Laboratory:  Recent Labs Lab 12/04/15 2330 12/05/15 0408 12/06/15 0549  WBC 10.2 9.1 9.1  HGB 9.5* 9.0* 9.7*  HCT 30.5* 29.3* 31.0*  PLT 364 337 373    Recent Labs Lab 12/04/15 2330 12/05/15 0408 12/06/15 0549  NA 136 135 139  K 3.9 3.8 4.0  CL 98* 98* 104  CO2 BUN CREATININE 0.92 1.06* 0.99  CALCIUM 8.7* 8.6* 8.8*  PROT 6.2* 6.2*  --   BILITOT 0.4 0.4  --   ALKPHOS 78 79  --  ALT 11* 12*  --   AST 16 15  --   GLUCOSE 103* 137* 118*  - Anemia Panel: Iron 11, TIBC 305, Saturation 4, Ferritin 66  Marquette Saa, MD 12/07/2015, 9:27 AM PGY-1, [facility] FPTS Intern pager: [phone], text pages welcome

## 2015-12-07 NOTE — Discharge Summary (Signed)
Family Medicine Teaching [facility] Discharge Summary  Patient name: [name] Medical record number: [id number] Date of birth: [birth year] Age: 60 y.o. Gender: female Date of Admission: 12/04/2015  Date of Discharge: 12/13/15 Admitting Physician: Bobette Mo, MD  Primary Care Provider: Norberto Sorenson, MD Consultants: surgery, wound care, ID  Indication for Hospitalization: cellulitis  Discharge Diagnoses/Problem List:  Patient Active Problem List   Diagnosis Date Noted  . Psoriatic arthritis (HCC)   . Cough   . Fever   . Wheezing   . Abscess of lower leg   . Cellulitis and abscess 12/05/2015  . Cellulitis of both lower extremities 12/05/2015  . Depression 12/05/2015  . Chronic anemia 12/05/2015  . Chronic venous insufficiency 12/04/2015  . Alcohol abuse 11/21/2014  . Anxiety state 11/21/2014  . BMI 40.0-44.9, adult (HCC) 11/21/2014  . Acute blood loss anemia 09/25/2014  . Epistaxis 09/24/2014     Disposition: home  Discharge Condition: improved  Discharge Exam:  General: resting comfortably in bed, in NAD Cardiovascular: RRR, no murmurs appreciated Respiratory: faint wheezes scattered throughout; normal work of breathing on RA Abdomen: Soft, non-tender, non-distended, +BS Skin: Warmth, erythema, and edema of bilateral lower extremities R worse than L; multiple draining abscesses noted as well as nonfluctuant, erythematous, indurated nondraining nodule in upper left buttock; erythema of R lower leg cellulitis unchanged from yesterday  Brief Hospital Course:   Cellulitis and abscesses: Patient presented to ED after one week of worsening bilateral lower extremity swelling and erythema. She also reported warmth and tenderness in some areas, as well as low-grade fevers at home. She initially went to her PCP's office, and was started on doxycycline and Bactrim, with minimal relief. She was then seen at an urgent care center on day prior to admission, and a few  abscesses were identified and drained. She developed a new area of erythema and tenderness on her L upper thigh which was very concerning, so she presented to the ED. This abscess was drained in the ED, however she was admitted for IV antibiotic treatment and further wound management.   Upon admission, surgery and wound care were consulted. Per surgery, patient had no lesions requiring operative procedure, and IV antibiotics with warm compresses would be sufficient treatment. Wound care recommended outpatient dermatology follow-up upon discharge. Patient was started on vancomycin and zosyn, however did not improve, and became febrile after the third day. ID was then consulted, who recommended transition to Ancef for more specific coverage. Due to error, Ancef was not started immediately when vanc/zosyn was discontinued, so patient did not receive antibiotics for 36-48 hours. Upon realizing this error, the patient was immediately resumed on Ancef. She was then transitioned to clinda, and finally to Keflex, per ID recs. After improvement on PO Keflex, she was deemed stable for discharge home.   Anemia: Patient was found to have hemoglobin of 9 on admission. Anemia panel showed low Fe and low saturation, suggesting iron deficiency as cause of anemia. She was started on ferrous sulfate.   Issues for Follow Up:  1. Patient started on ferrous sulfate 325 mg during admission for iron-deficiency anemia.  2. Patient reportedly brought up the amount of Norco she takes every day, which she reports is only two 10-325 mg tabs. She told me daily "I'm not an addict but I am concerned about what will happen when they cut me off [from Norco]." Patient even mentioned illicit drugs, specifically heroin, and said that if she does not have Norco she will be  forced to turn to something else for pain control.  3. Patient discharged with 13 additional days of Keflex to complete 14 day course (last dose 12/26/15).   Significant  Procedures: none  Significant Labs and Imaging:   Recent Labs Lab 12/08/15 0507 12/09/15 1220 12/11/15 0745  WBC 7.5 7.6 7.8  HGB 8.8* 9.1* 9.2*  HCT 28.9* 29.3* 28.9*  PLT 365 406* 468*    Recent Labs Lab 12/08/15 0507 12/11/15 0745 12/12/15 0546  NA 136 141  --   K 3.8 3.7  --   CL 101 99*  --   CO2 24 32  --   GLUCOSE 112* 100*  --   BUN 6 5*  --   CREATININE 0.89 0.74 0.79  CALCIUM 8.6* 8.7*  --     Results/Tests Pending at Time of Discharge: blood culture (NGx3d at discharge)  Discharge Medications:    Medication List    STOP taking these medications        doxycycline 100 MG capsule  Commonly known as:  VIBRAMYCIN     sulfamethoxazole-trimethoprim 800-160 MG tablet  Commonly known as:  BACTRIM DS,SEPTRA DS      TAKE these medications        amphetamine-dextroamphetamine 30 MG 24 hr capsule  Commonly known as:  ADDERALL XR  Take 30 mg by mouth 2 (two) times daily. Reported on 12/03/2015     cephALEXin 500 MG capsule  Commonly known as:  KEFLEX  Take 2 capsules (1,000 mg total) by mouth 2 (two) times daily.     DULoxetine 30 MG capsule  Commonly known as:  CYMBALTA  Take 2 capsules by mouth daily.     EPINEPHrine 0.3 mg/0.3 mL Soaj injection  Commonly known as:  EPI-PEN  Inject 0.3 mLs (0.3 mg total) into the muscle once.     FLUoxetine 20 MG capsule  Commonly known as:  PROZAC  Take 20 mg by mouth 2 (two) times daily.     HYDROcodone-acetaminophen 5-325 MG tablet  Commonly known as:  NORCO  Take 1 tablet by mouth every 6 (six) hours as needed.     traZODone 50 MG tablet  Commonly known as:  DESYREL  TAKE 50 MG BY MOUTH DAILY AT BEDTIME AS NEEDED FOR SLEEP        Discharge Instructions: Please refer to Patient Instructions section of EMR for full details.  Patient was counseled important signs and symptoms that should prompt return to medical care, changes in medications, dietary instructions, activity restrictions, and follow up  appointments.   Follow-Up Appointments: Follow-up Information    Follow up with [facility], MD. Schedule an appointment as soon as possible for a visit in 1 week.   Specialty:  Family Medicine   Why:  For hospital follow-up   Contact information:   [address] [place] [address] [phone]       Marquette Saa, MD 12/13/2015, 8:06 AM PGY-1, [facility]

## 2015-12-07 NOTE — Progress Notes (Signed)
Family Medicine Teaching Service Daily Progress Note Intern Pager: [phone]  Patient name: [name] Medical record number: [id number] Date of birth: July 13, 1956 Age: 60 y.o. Gender: female  Primary Care Provider: Norberto Sorenson, MD Consultants: Surgery, WOC Code Status: Full  Assessment and Plan: [name] is a 60yo female presenting with lower extremity swelling and redness. PMH includes depression, osteoarthritis, anemia, OSA (not on CPAP), obesity s/p gastric bypass surgery in 2002, venous insufficiency  # Cellulitis with Abscesses: Bilateral lower extremities, right worse than left. Multiple I&Ds performed. Failed courses of Augmentin, Doxycycline, and Bactrim. qSOFA score 0. Lactic Acid normal. HIV nonreactive. Surgery consulted, who recommended no further I&Ds at this time, and continuing abx with heating packs  WOC recommended outpatient dermatology f/u. Remains afebrile with stable vital signs. Per surgery, still no indication for operative management; recommend transitioning to PO abx today or tomorrow.  - Area of cellulitis demarcated. Monitor daily for improvement. - F/u blood cx - NGx2d - F/u wound cx - NGx2d - Cont vancomycin (2/10>), Zosyn (2/12>). Anticipate transition to PO tomorrow.  - Surgery consulted - appreciate recs  # Elevated Creatinine: Creatinine elevated to 1.06 at admission, baseline 0.9. Improved to 0.89. - Continue to monitor  # Hyperglycemia: Glucose elevated to 137. A1C 5.4 (2/11). Improved to 112 overnight.  - Continue to monitor. Consider initiating sliding scale if remains elevated.   # Anemia: Hgb 9 at admission. Anemia panel shows iron low at 11 and saturation low at 4, indicating iron deficiency as etiology of anemia. Hgb decreased to 8.8 this AM.  - Initiate Ferrous sulfate  - Miralax PRN constipation  # History of Alcohol Abuse: Drinks 6 pack of beer daily. CIWA scores 1, 2, and 2 overnight.  - CIWA  # Depression:  Home medications include  Cymbalta , Fluoxetine , and Trazodone . - Continue home medications.  FEN/GI: Heart Healthy PPx: Lovenox  Disposition: Home pending clinical improvement  Subjective:  Patient complaining mostly of cough and HA this AM. She says she began coughing night before last, though it improved during the day yesterday. She also reports persistent HA, but says pain meds do provide relief.   Objective: Temp:  [97.8 F (36.6 C)-99.3 F (37.4 C)] 97.8 F (36.6 C) (02/14 0442) Pulse Rate:  [88-95] 88 (02/14 0442) Resp:  [17-18] 17 (02/14 0442) BP: (119-139)/(60-73) 139/73 mmHg (02/14 0442) SpO2:  [91 %-95 %] 95 % (02/14 0442) Physical Exam: General: sleeping comfortably in bed when I entered the room but easily able to arouse; in NAD; coughing intermittently throughout encounter Cardiovascular: Distant heart sounds likely 2/2 habitus; normal rate, no murmurs appreciated Respiratory: Scattered wheezes primarily in bases, good air movement, normal work of breathing Abdomen: Soft, non-tender, non-distended, +BS Extremities: Pitting edema noted, Homan's negative Skin: Warmth, erythema, and edema of bilateral lower extremities R worse than L; multiple draining abscesses noted as well as nonfluctuant nondraining nodules in upper left thigh and left buttock  Laboratory:  Recent Labs Lab 12/05/15 0408 12/06/15 0549 12/08/15 0507  WBC 9.1 9.1 7.5  HGB 9.0* 9.7* 8.8*  HCT 29.3* 31.0* 28.9*  PLT 337 373 365    Recent Labs Lab 12/04/15 2330 12/05/15 0408 12/06/15 0549 12/08/15 0507  NA 136 135 139 136  K 3.9 3.8 4.0 3.8  CL 98* 98* 104 101  CO2 BUN CREATININE 0.92 1.06* 0.99 0.89  CALCIUM 8.7* 8.6* 8.8* 8.6*  PROT 6.2* 6.2*  --   --  BILITOT 0.4 0.4  --   --   ALKPHOS 78 79  --   --   ALT 11* 12*  --   --   AST 16 15  --   --   GLUCOSE 103* 137* 118* 112*  - Anemia Panel: Iron 11, TIBC 305, Saturation 4, Ferritin 66  Marquette Saa,  MD 12/08/2015, 8:46 AM PGY-1, [facility] FPTS Intern pager: [phone], text pages welcome

## 2015-12-07 NOTE — Progress Notes (Signed)
[facility] Progress Note     Subjective: Pt doing okay.  No increase in left buttock where were were concerned for abscess.  Rest of wounds she said are a little less painful and redness is improving.  No N/V, tolerating diet.  Objective: Vital signs in last 24 hours: Temp:  [97.9 F (36.6 C)-98.4 F (36.9 C)] 97.9 F (36.6 C) (02/13 0531) Pulse Rate:  [85-101] 85 (02/13 0600) Resp:  [17-18] 18 (02/13 0531) BP: (132-150)/(73-80) 132/79 mmHg (02/13 0531) SpO2:  [96 %-98 %] 98 % (02/13 0531) Last BM Date: 12/05/15  Intake/Output from previous day: 02/12 0701 - 02/13 0700 In: 2713 [P.O.:940; I.V.:1773] Out: 600 [Urine:600] Intake/Output this shift:    PE: Gen:  Alert, NAD, pleasant Skin: chronic LE venous stasis. Edema, erythema and induration to her shins. Right LE wound is weeping seropurulent drainage and there are multiple bullae. Left leg has 2 I&D sites with 1/4 inch wicks in place without significant drainage. Left hip s/p I&D, new 2cm nodule in left buttock without significant fluctuance or erythema, not tender.  Lab Results:   Recent Labs  12/05/15 0408 12/06/15 0549  WBC 9.1 9.1  HGB 9.0* 9.7*  HCT 29.3* 31.0*  PLT 337 373   BMET  Recent Labs  12/05/15 0408 12/06/15 0549  NA 135 139  K 3.8 4.0  CL 98* 104  CO2 28 24  GLUCOSE 137* 118*  BUN 10 8  CREATININE 1.06* 0.99  CALCIUM 8.6* 8.8*   PT/INR No results for input(s): LABPROT, INR in the last 72 hours. CMP     Component Value Date/Time   NA 139 12/06/2015 0549   K 4.0 12/06/2015 0549   CL 104 12/06/2015 0549   CO2 24 12/06/2015 0549   GLUCOSE 118* 12/06/2015 0549   BUN 8 12/06/2015 0549   CREATININE 0.99 12/06/2015 0549   CREATININE 0.99 11/21/2014 1538   CALCIUM 8.8* 12/06/2015 0549   PROT 6.2* 12/05/2015 0408   ALBUMIN 3.0* 12/05/2015 0408   AST 15 12/05/2015 0408   ALT 12* 12/05/2015 0408   ALKPHOS 79 12/05/2015 0408   BILITOT 0.4 12/05/2015 0408   GFRNONAA >60  12/06/2015 0549   GFRAA >60 12/06/2015 0549   Lipase  No results found for: LIPASE     Studies/Results: No results found.  Anti-infectives: Anti-infectives    Start     Dose/Rate Route Frequency Ordered Stop   12/06/15 1800  piperacillin-tazobactam (ZOSYN) IVPB 3.375 g     3.375 g 12.5 mL/hr over 240 Minutes Intravenous Every 8 hours 12/06/15 0938     12/06/15 1000  piperacillin-tazobactam (ZOSYN) IVPB 3.375 g     3.375 g 100 mL/hr over 30 Minutes Intravenous  Once 12/06/15 0937 12/06/15 1121   12/05/15 1030  fluconazole (DIFLUCAN) tablet 150 mg     150 mg Oral  Once 12/05/15 1028 12/05/15 1103   12/05/15 1000  vancomycin (VANCOCIN) IVPB 1000 mg/200 mL premix     1,000 mg 200 mL/hr over 60 Minutes Intravenous Every 12 hours 12/05/15 0121     12/04/15 2245  vancomycin (VANCOCIN) 2,000 mg in sodium chloride 0.9 % 500 mL IVPB     2,000 mg 250 mL/hr over 120 Minutes Intravenous  Once 12/04/15 2236 12/05/15 0059       Assessment/Plan Chronic LE venous stasis with abscesses s/p I&D Multiple other areas of subcutaneous abscess s/p I&D Left buttock infection  Plan: 1. No acute surgical needs at this time. Nothing to drain on  left buttock. Needs continued IV antibiotics, follow cultures. 2. Heat packs, dressing changes to I&D sites/packing changes 3. WOC following and agree with their recommendations 4. IV antibiotics 5.  Agree with Derm/vascular as an outpatient.    LOS: 2 days    Nonie Hoyer 12/07/2015, 8:06 AM Pager: [phone]

## 2015-12-08 LAB — CBC
HCT: 28.9 % — ABNORMAL LOW (ref 36.0–46.0)
HEMOGLOBIN: 8.8 g/dL — AB (ref 12.0–15.0)
MCH: 29.4 pg (ref 26.0–34.0)
MCHC: 30.4 g/dL (ref 30.0–36.0)
MCV: 96.7 fL (ref 78.0–100.0)
PLATELETS: 365 10*3/uL (ref 150–400)
RBC: 2.99 MIL/uL — AB (ref 3.87–5.11)
RDW: 14.3 % (ref 11.5–15.5)
WBC: 7.5 10*3/uL (ref 4.0–10.5)

## 2015-12-08 LAB — BASIC METABOLIC PANEL
ANION GAP: 11 (ref 5–15)
BUN: 6 mg/dL (ref 6–20)
CHLORIDE: 101 mmol/L (ref 101–111)
CO2: 24 mmol/L (ref 22–32)
Calcium: 8.6 mg/dL — ABNORMAL LOW (ref 8.9–10.3)
Creatinine, Ser: 0.89 mg/dL (ref 0.44–1.00)
Glucose, Bld: 112 mg/dL — ABNORMAL HIGH (ref 65–99)
POTASSIUM: 3.8 mmol/L (ref 3.5–5.1)
SODIUM: 136 mmol/L (ref 135–145)

## 2015-12-08 MED ORDER — DEXTROMETHORPHAN POLISTIREX ER 30 MG/5ML PO SUER
15.0000 mg | Freq: Two times a day (BID) | ORAL | Status: DC | PRN
  Administered 2015-12-08 – 2015-12-10 (×2): 15 mg via ORAL
  Filled 2015-12-08 (×4): qty 5

## 2015-12-08 NOTE — Progress Notes (Signed)
MD on call paged due to patient having a temp of 101. Awaiting return call. Will continue to monitor.

## 2015-12-08 NOTE — Consult Note (Signed)
WOC wound follow up Wound type: LE ulcers/nodules, unknown etiology Measurement: Wound bed: The left thigh/left pretibial/buttock are unchanged, however the right pretibial has two additional blood filled bulla today, no real improvement in the right pretibial lesion Drainage (amount, consistency, odor) drainage from each of the sites (left pretibial/right pretibial) is not foul, but continues to be significant. Serosanguinous  Periwound: erythema  improved bilateral LEs Dressing procedure/placement/frequency: I am going to switch the patient to a silver hydrofiber today for exudate management and for the antibacterial effects, since the ulcer on the right leg as not improved.  Continue Iodoform packing to the LLE pretibial ulcers. Would continue to suggest follow up with a dermatology as an outpatient to confirm etiology of the nodules/ulcers.   Discussed POC with patient and bedside nurse.  Re consult if needed, will not follow at this time. Thanks  Tahiry Spicer [employer], CWOCN [phone])

## 2015-12-08 NOTE — Progress Notes (Signed)
Family Medicine Teaching Service Daily Progress Note Intern Pager: [phone]  Patient name: [name] Medical record number: [id number] Date of birth: 04-24-1956 Age: 60 y.o. Gender: female  Primary Care Provider: Norberto Sorenson, MD Consultants: Surgery, WOC Code Status: Full  Assessment and Plan: Mrs. Titzer is a 60yo female presenting with lower extremity swelling and redness. PMH includes depression, osteoarthritis, anemia, OSA (not on CPAP), obesity s/p gastric bypass surgery in 2002, venous insufficiency  # Cellulitis with Abscesses: Bilateral lower extremities, right worse than left. Multiple I&Ds performed. Failed courses of Augmentin, Doxycycline, and Bactrim. qSOFA score 0. Lactic Acid normal. HIV nonreactive. Surgery consulted, who recommended no further I&Ds at this time, and continuing abx with heating packs  WOC recommended outpatient dermatology f/u. Per surgery, still no indication for operative management; recommend transitioning to PO abx. Febrile overnight to 101F - could be due to vanc, or possibly URI/PNA given coughing and wheezing. CXR this AM with asymmetric opacities in the R lung which may represent PNA vs edema. Consulted ID (Dr. Drue Second) this AM, who felt that vanc and zosyn is too broad, and recommending narrowing therapy to Ancef for better coverage of abscesses. They feel that her fever is more likely caused by the abscesses rather than possible PNA.  - Area of cellulitis demarcated. Monitor daily for improvement. - F/u blood cx (2/11)- NGx3d - F/u wound cx - NGx3d - Surgery consulted - appreciate recs - F/u repeat blood cx (2/14) - F/u AM CBC to monitor WBC - D/c vanc/zosyn. Begin Ancef 2g q8 per ID recs  # Elevated Creatinine: Creatinine elevated to 1.06 at admission, baseline 0.9. Improved to 0.89. - Continue to monitor  # Hyperglycemia: Glucose elevated to 137. A1C 5.4 (2/11). Improved to 112.  - Continue to monitor. Consider initiating sliding scale if remains  elevated.   # Anemia: Hgb 9 at admission. Anemia panel shows iron low at 11 and saturation low at 4, indicating iron deficiency as etiology of anemia.  - Initiate Ferrous sulfate  - Miralax PRN constipation - F/u AM CBC  # History of Alcohol Abuse: Drinks 6 pack of beer daily. CIWA scores 0 overnight.  - CIWA  # Depression:  Home medications include Cymbalta , Fluoxetine , and Trazodone . - Continue home medications.  FEN/GI: Heart Healthy PPx: Lovenox  Disposition: Home pending clinical improvement  Subjective:  Patient still complaining of coughing this AM. Not worse than yesterday, but still present. Also complaining of HA.  Patient had fever of 101F overnight. Blood cultures were drawn.   Objective: Temp:  [98.1 F (36.7 C)-101 F (38.3 C)] 98.1 F (36.7 C) (02/15 0525) Pulse Rate:  [85-90] 85 (02/15 0525) Resp:  [17-19] 17 (02/15 0525) BP: (137-163)/(72-87) 137/72 mmHg (02/15 0525) SpO2:  [91 %-100 %] 98 % (02/15 0525) Physical Exam: General: sitting on side of bed eating breakfast; in NAD; coughing intermittently throughout encounter Cardiovascular: Distant heart sounds likely 2/2 habitus; normal rate, no murmurs appreciated Respiratory: Scattered wheezes, good air movement, normal work of breathing on RA Abdomen: Soft, non-tender, non-distended, +BS Skin: Warmth, erythema, and edema of bilateral lower extremities R worse than L; multiple draining abscesses noted as well as nonfluctuant nondraining nodules in upper left thigh and left buttock  Laboratory:  Recent Labs Lab 12/05/15 0408 12/06/15 0549 12/08/15 0507  WBC 9.1 9.1 7.5  HGB 9.0* 9.7* 8.8*  HCT 29.3* 31.0* 28.9*  PLT 337 373 365    Recent Labs Lab 12/04/15 2330 12/05/15 0408 12/06/15 0549 12/08/15  0507  NA 136 135 139 136  K 3.9 3.8 4.0 3.8  CL 98* 98* 104 101  CO2 BUN CREATININE 0.92 1.06* 0.99 0.89  CALCIUM 8.7* 8.6* 8.8* 8.6*  PROT 6.2* 6.2*  --    --   BILITOT 0.4 0.4  --   --   ALKPHOS 78 79  --   --   ALT 11* 12*  --   --   AST 16 15  --   --   GLUCOSE 103* 137* 118* 112*  - Anemia Panel: Iron 11, TIBC 305, Saturation 4, Ferritin 66  Marquette Saa, MD 12/09/2015, 11:45 AM PGY-1, [facility] Family Medicine FPTS Intern pager: [phone], text pages welcome

## 2015-12-08 NOTE — Progress Notes (Signed)
[facility] Progress Note     Subjective: Pt doing well, she says redness and pain improved.  Mostly c/o a productive cough.  Ambulating well.  No N/V, tolerating diet.  Objective: Vital signs in last 24 hours: Temp:  [97.8 F (36.6 C)-99.3 F (37.4 C)] 97.8 F (36.6 C) (02/14 0442) Pulse Rate:  [88-95] 88 (02/14 0442) Resp:  [17-18] 17 (02/14 0442) BP: (119-139)/(60-73) 139/73 mmHg (02/14 0442) SpO2:  [91 %-95 %] 95 % (02/14 0442) Last BM Date: 12/07/15  Intake/Output from previous day: 02/13 0701 - 02/14 0700 In: 1260 [P.O.:1260] Out: 500 [Urine:500] Intake/Output this shift:    PE: Gen:  Alert, NAD, pleasant Skin: chronic LE venous stasis. Edema, erythema and induration to her shins. Right LE wound with sanguinous drainage and bullae are resolving. Left leg has 2 I&D sites with 1/4 inch wicks in place without significant drainage. Left hip s/p I&D, 2cm nodule in left buttock improved, no tenderness.  Lab Results:   Recent Labs  12/06/15 0549 12/08/15 0507  WBC 9.1 7.5  HGB 9.7* 8.8*  HCT 31.0* 28.9*  PLT 373 365   BMET  Recent Labs  12/06/15 0549 12/08/15 0507  NA 139 136  K 4.0 3.8  CL 104 101  CO2 24 24  GLUCOSE 118* 112*  BUN 8 6  CREATININE 0.99 0.89  CALCIUM 8.8* 8.6*   PT/INR No results for input(s): LABPROT, INR in the last 72 hours. CMP     Component Value Date/Time   NA 136 12/08/2015 0507   K 3.8 12/08/2015 0507   CL 101 12/08/2015 0507   CO2 24 12/08/2015 0507   GLUCOSE 112* 12/08/2015 0507   BUN 6 12/08/2015 0507   CREATININE 0.89 12/08/2015 0507   CREATININE 0.99 11/21/2014 1538   CALCIUM 8.6* 12/08/2015 0507   PROT 6.2* 12/05/2015 0408   ALBUMIN 3.0* 12/05/2015 0408   AST 15 12/05/2015 0408   ALT 12* 12/05/2015 0408   ALKPHOS 79 12/05/2015 0408   BILITOT 0.4 12/05/2015 0408   GFRNONAA >60 12/08/2015 0507   GFRAA >60 12/08/2015 0507   Lipase  No results found for: LIPASE     Studies/Results: No  results found.  Anti-infectives: Anti-infectives    Start     Dose/Rate Route Frequency Ordered Stop   12/06/15 1800  piperacillin-tazobactam (ZOSYN) IVPB 3.375 g     3.375 g 12.5 mL/hr over 240 Minutes Intravenous Every 8 hours 12/06/15 0938     12/06/15 1000  piperacillin-tazobactam (ZOSYN) IVPB 3.375 g     3.375 g 100 mL/hr over 30 Minutes Intravenous  Once 12/06/15 0937 12/06/15 1121   12/05/15 1030  fluconazole (DIFLUCAN) tablet 150 mg     150 mg Oral  Once 12/05/15 1028 12/05/15 1103   12/05/15 1000  vancomycin (VANCOCIN) IVPB 1000 mg/200 mL premix     1,000 mg 200 mL/hr over 60 Minutes Intravenous Every 12 hours 12/05/15 0121     12/04/15 2245  vancomycin (VANCOCIN) 2,000 mg in sodium chloride 0.9 % 500 mL IVPB     2,000 mg 250 mL/hr over 120 Minutes Intravenous  Once 12/04/15 2236 12/05/15 0059       Assessment/Plan Chronic LE venous stasis with abscesses s/p I&D Multiple other areas of subcutaneous abscess s/p I&D Left buttock infection  Plan: 1. No acute surgical needs at this time. Nothing to drain on left buttock. Cellulitis is receding.  Consider switching to oral antibiotics today or tomorrow, cultures NGTD. 2. Heat packs, dressing  changes to I&D sites/packing changes 3. WOC following and agree with their recommendations 4. IV antibiotics 5. Agree with Derm/vascular as an outpatient. 6.  Will sign off, call with questions/concerns.  No need for general surgery follow up.   LOS: 3 days    Nonie Hoyer 12/08/2015, 8:08 AM Pager: [phone]

## 2015-12-08 NOTE — Care Management Note (Signed)
Case Management Note  Patient Details  Name: [name] MRN: [id number] Date of Birth: July 03, 1956  Subjective/Objective:                    Action/Plan:   Expected Discharge Date:                  Expected Discharge Plan:  Home/Self Care  In-House Referral:     Discharge planning Services     Post Acute Care Choice:    Choice offered to:     DME Arranged:    DME Agency:     HH Arranged:    HH Agency:     Status of Service:     Medicare Important Message Given:    Date Medicare IM Given:    Medicare IM give by:    Date Additional Medicare IM Given:    Additional Medicare Important Message give by:     If discussed at Long Length of Stay Meetings, dates discussed:  12-08-15  Additional Comments: UR updated  Kingsley Plan, RN 12/08/2015, 2:01 PM

## 2015-12-09 ENCOUNTER — Inpatient Hospital Stay (HOSPITAL_COMMUNITY): Payer: BC Managed Care – PPO

## 2015-12-09 DIAGNOSIS — R05 Cough: Secondary | ICD-10-CM

## 2015-12-09 DIAGNOSIS — R509 Fever, unspecified: Secondary | ICD-10-CM | POA: Insufficient documentation

## 2015-12-09 DIAGNOSIS — R062 Wheezing: Secondary | ICD-10-CM | POA: Insufficient documentation

## 2015-12-09 DIAGNOSIS — F329 Major depressive disorder, single episode, unspecified: Secondary | ICD-10-CM

## 2015-12-09 DIAGNOSIS — L02419 Cutaneous abscess of limb, unspecified: Secondary | ICD-10-CM

## 2015-12-09 DIAGNOSIS — R059 Cough, unspecified: Secondary | ICD-10-CM | POA: Insufficient documentation

## 2015-12-09 LAB — CBC
HEMATOCRIT: 29.3 % — AB (ref 36.0–46.0)
Hemoglobin: 9.1 g/dL — ABNORMAL LOW (ref 12.0–15.0)
MCH: 29.6 pg (ref 26.0–34.0)
MCHC: 31.1 g/dL (ref 30.0–36.0)
MCV: 95.4 fL (ref 78.0–100.0)
Platelets: 406 10*3/uL — ABNORMAL HIGH (ref 150–400)
RBC: 3.07 MIL/uL — ABNORMAL LOW (ref 3.87–5.11)
RDW: 14.2 % (ref 11.5–15.5)
WBC: 7.6 10*3/uL (ref 4.0–10.5)

## 2015-12-09 MED ORDER — ACYCLOVIR 5 % EX OINT
TOPICAL_OINTMENT | CUTANEOUS | Status: DC
  Administered 2015-12-09 – 2015-12-11 (×9): via TOPICAL
  Administered 2015-12-12 (×2): 1 via TOPICAL
  Administered 2015-12-12 (×2): via TOPICAL
  Administered 2015-12-12: 1 via TOPICAL
  Administered 2015-12-12: 13:00:00 via TOPICAL
  Administered 2015-12-13: 1 via TOPICAL
  Administered 2015-12-13: 09:00:00 via TOPICAL
  Filled 2015-12-09 (×2): qty 15

## 2015-12-09 NOTE — Progress Notes (Signed)
Family Medicine Teaching Service Daily Progress Note Intern Pager: [phone]  Patient name: [name] Medical record number: [id number] Date of birth: 08/19/1956 Age: 60 y.o. Gender: female  Primary Care Provider: Norberto Sorenson, MD Consultants: Surgery, WOC, ID Code Status: Full  Assessment and Plan: [name] is a 60yo female presenting with lower extremity swelling and redness. PMH includes depression, osteoarthritis, anemia, OSA (not on CPAP), obesity s/p gastric bypass surgery in 2002, venous insufficiency  # Cellulitis with Abscesses: Bilateral lower extremities, right worse than left. Multiple I&Ds performed. Failed courses of Augmentin, Doxycycline, and Bactrim. qSOFA score 0. Lactic Acid normal. HIV nonreactive. Surgery consulted, who recommended no further I&Ds at this time, and continuing abx with heating packs  WOC recommended outpatient dermatology f/u. Per surgery, still no indication for operative management; recommend transitioning to PO abx. CXR (2/15) with asymmetric opacities in the R lung which may represent PNA vs edema. Consulted ID yesterday, who felt that transition to Ancef would provide better coverage for organisms most likely responsible for cellulitis. Patient afebrile again overnight to 100.22F.  - Area of cellulitis demarcated. Monitor daily for improvement. - F/u blood cx (2/11)- NGx4d - F/u wound cx - NGTD - Cont Ancef (2/15>) - F/u repeat blood cx (2/14) - pending - Consulted ID - will see patient today. Appreciate recommendations.   # Elevated Creatinine: Creatinine elevated to 1.06 at admission, baseline 0.9. Improved to 0.89. - Continue to monitor  # Hyperglycemia: Glucose elevated to 137. A1C 5.4 (2/11). Improved to 112.  - Continue to monitor. Consider initiating sliding scale if remains elevated.   # Anemia: Hgb 9 at admission. Anemia panel shows iron low at 11 and saturation low at 4, indicating iron deficiency as etiology of anemia. Hgb 9.1 (2/15).  -  Initiate Ferrous sulfate  - Miralax PRN constipation - F/u AM CBC  # History of Alcohol Abuse: Drinks 6 pack of beer daily. CIWA scores 0 overnight.  - Will d/c CIWA as patient has been here for 5 days and last two days has had CIWA scores of 0  # Depression:  Home medications include Cymbalta , Fluoxetine , and Trazodone . - Continue home medications.  FEN/GI: Heart Healthy PPx: Lovenox  Disposition: Home pending clinical improvement  Subjective:  Patient reports that she does not think she is improving. She especially feels that the lesions on her L upper thigh and buttocks are worsening. Her respiratory status and cough have improved some.   Objective: Temp:  [98.4 F (36.9 C)-100.5 F (38.1 C)] 98.5 F (36.9 C) (02/16 0700) Pulse Rate:  [87-94] 87 (02/16 0700) Resp:  [18-20] 18 (02/16 0530) BP: (128-152)/(77-96) 130/77 mmHg (02/16 0700) SpO2:  [93 %-95 %] 93 % (02/16 0700) Physical Exam: General: resting comfortably in bed in NAD Cardiovascular: Distant heart sounds likely 2/2 habitus; normal rate, no murmurs appreciated Respiratory: Scattered wheezes, good air movement, normal work of breathing on RA Abdomen: Soft, non-tender, non-distended, +BS Skin: Warmth, erythema, and edema of bilateral lower extremities R worse than L; multiple draining abscesses noted as well as nonfluctuant nondraining nodules in upper left thigh and left buttock; erythema of R lower leg cellulitis appears to be worsening from yesterday  Laboratory:  Recent Labs Lab 12/06/15 0549 12/08/15 0507 12/09/15 1220  WBC 9.1 7.5 7.6  HGB 9.7* 8.8* 9.1*  HCT 31.0* 28.9* 29.3*  PLT 373 365 406*    Recent Labs Lab 12/04/15 2330 12/05/15 0408 12/06/15 0549 12/08/15 0507  NA 136 135 139 136  K 3.9 3.8 4.0 3.8  CL 98* 98* 104 101  CO2 BUN CREATININE 0.92 1.06* 0.99 0.89  CALCIUM 8.7* 8.6* 8.8* 8.6*  PROT 6.2* 6.2*  --   --   BILITOT 0.4 0.4  --   --    ALKPHOS 78 79  --   --   ALT 11* 12*  --   --   AST 16 15  --   --   GLUCOSE 103* 137* 118* 112*  - Anemia Panel: Iron 11, TIBC 305, Saturation 4, Ferritin 66  Marquette Saa, MD 12/10/2015, 9:21 AM PGY-1, [facility] Family Medicine FPTS Intern pager: [phone], text pages welcome

## 2015-12-10 ENCOUNTER — Encounter: Payer: Self-pay | Admitting: Family Medicine

## 2015-12-10 DIAGNOSIS — F1010 Alcohol abuse, uncomplicated: Secondary | ICD-10-CM

## 2015-12-10 DIAGNOSIS — I878 Other specified disorders of veins: Secondary | ICD-10-CM

## 2015-12-10 DIAGNOSIS — L02415 Cutaneous abscess of right lower limb: Secondary | ICD-10-CM

## 2015-12-10 DIAGNOSIS — L02416 Cutaneous abscess of left lower limb: Secondary | ICD-10-CM

## 2015-12-10 DIAGNOSIS — L989 Disorder of the skin and subcutaneous tissue, unspecified: Secondary | ICD-10-CM

## 2015-12-10 DIAGNOSIS — E669 Obesity, unspecified: Secondary | ICD-10-CM

## 2015-12-10 DIAGNOSIS — K5900 Constipation, unspecified: Secondary | ICD-10-CM

## 2015-12-10 DIAGNOSIS — R918 Other nonspecific abnormal finding of lung field: Secondary | ICD-10-CM

## 2015-12-10 DIAGNOSIS — Z9884 Bariatric surgery status: Secondary | ICD-10-CM

## 2015-12-10 DIAGNOSIS — B9689 Other specified bacterial agents as the cause of diseases classified elsewhere: Secondary | ICD-10-CM

## 2015-12-10 DIAGNOSIS — D649 Anemia, unspecified: Secondary | ICD-10-CM

## 2015-12-10 LAB — CULTURE, BLOOD (ROUTINE X 2)
CULTURE: NO GROWTH
Culture: NO GROWTH

## 2015-12-10 MED ORDER — HYDROCODONE-ACETAMINOPHEN 10-325 MG PO TABS
1.0000 | ORAL_TABLET | Freq: Once | ORAL | Status: AC
  Administered 2015-12-10: 1 via ORAL

## 2015-12-10 NOTE — Consult Note (Signed)
[facility]  Date of Admission:  12/04/2015  Date of Consult:  12/10/2015  Reason for Consult:BLE cellulitis Referring Physician: Lady Gary  Impression/Recommendation BLE abscess, cellulitis Would consider bx Derm eval if possible to obtain Consider CT, u/s of RLE. She has calf pain with flexion.  No change in ancef Rheum w/u (ANA, ANCA, RF) Her obesity makes her at risk for hidradenitis, these would be uncommon locations for this but this dx still should be considered.   Chronic venous stasis Prev injections, no prev stripping.   Constipation Consider flat and up right, laxatives.   ETOH abuse On CIWA protocol  Anemia Stable, on iron  Asymmetric opacities on CXR F/u CXR as outpt.   Obesity  Thank you so much for this truly interesting consult,   Johny Sax (pager) [phone] [email or web address]  [name] is an 60 y.o. female.  HPI: 60 yo F with hx of obesity (prev gastric bypass), OSA, comes to hospital on 2-11 with 1 week of low grade temps, BLE swelling and edema. She developed abscesses on her LE and had I & D, was started on doxy/bactrim day pta.  She was started on vanco on day of adm. Also given 1 dose of fluconazole.  She has been eval by surgery as well as WOC (serosanguinous drainage).  She developed fever to 101 on 2-14 and zosyn was added to her anbx.  Her case was discussed with ID on 2-15 and her anbx were change to ancef alone.  Tm o/n 100.5.  Wound Cx 2-9/10 negative. BCx are negative, ngtd.   Past Medical History  Diagnosis Date  . Depression   . Arthritis   . Seasonal allergies   . Anemia   . Obese   . Sleep apnea 2012    took test in [place]-told to use cpap-could not ude    Past Surgical History  Procedure Laterality Date  . Total knee revision  07/30/2010    right; with lateral release  . Anterior cervical decomp/discectomy fusion  05/09/2008    C5-6, C6-7; with arthrodesis also    . Rotator cuff repair w/ distal clavicle excision  01/06/2006    right  . 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 Total knee arthroplasty  11/07/2003    right  . Shoulder arthroscopy  2003    rt  . Knee arthroscopy  2004    rt and lt knee  . Shoulder arthroscopy  2003    rt  . Tonsillectomy    . Gastric bypass  2002     No Known Allergies  Medications:  Scheduled: . acyclovir ointment   Topical Q4H  . DULoxetine  60 mg Oral Daily  . enoxaparin (LOVENOX) injection  60 mg Subcutaneous Q24H  . ferrous sulfate  325 mg Oral Q breakfast  . FLUoxetine  20 mg Oral BID  . hydrocortisone   Rectal BID  . multivitamin with minerals  1 tablet Oral Daily  . traZODone  50 mg Oral QHS    Abtx:  Anti-infectives    Start     Dose/Rate Route Frequency Ordered Stop   12/06/15 1800  piperacillin-tazobactam (ZOSYN) IVPB 3.375 g  Status:  Discontinued     3.375 g 12.5 mL/hr over 240 Minutes Intravenous Every 8 hours 12/06/15 0938 12/09/15 1200   12/06/15 1000  piperacillin-tazobactam (ZOSYN) IVPB 3.375 g     3.375 g 100 mL/hr over 30 Minutes Intravenous  Once 12/06/15 8295  12/06/15 1121   12/05/15 1030  fluconazole (DIFLUCAN) tablet 150 mg     150 mg Oral  Once 12/05/15 1028 12/05/15 1103   12/05/15 1000  vancomycin (VANCOCIN) IVPB 1000 mg/200 mL premix  Status:  Discontinued     1,000 mg 200 mL/hr over 60 Minutes Intravenous Every 12 hours 12/05/15 0121 12/09/15 1200   12/04/15 2245  vancomycin (VANCOCIN) 2,000 mg in sodium chloride 0.9 % 500 mL IVPB     2,000 mg 250 mL/hr over 120 Minutes Intravenous  Once 12/04/15 2236 12/05/15 0059      Total days of antibiotics: 5 ancef          Social History:  reports that she quit smoking about 34 years ago. She does not have any smokeless tobacco history on file. She reports that she drinks about 3.0 oz of alcohol per week. She reports that she does not use illicit drugs.  Family History  Problem Relation Age of Onset  . Heart disease  Mother   . Cancer Father   uncle drove bus for [employer]  General ROS: occas oral ulcers, prev wheezing, no BM in hospital. normal urination.  Please see HPI. 12 point ROS o/w (-)  Blood pressure 129/80, pulse 93, temperature 98.4 F (36.9 C), temperature source Oral, resp. rate 18, height 5\' 6"  (1.676 m), weight 127.5 kg (281 lb 1.4 oz), SpO2 92 %. General appearance: alert, cooperative and no distress Eyes: negative findings: conjunctivae and sclerae normal and pupils equal, round, reactive to light and accomodation Throat: normal findings: buccal mucosa normal, soft palate, uvula, and tonsils normal and oropharynx pink & moist without lesions or evidence of thrush Neck: no adenopathy and supple, symmetrical, trachea midline Lungs: clear to auscultation bilaterally Heart: regular rate and rhythm Abdomen: normal findings: bowel sounds normal and soft, non-tender Extremities: multiple areas of raised, cystic lesions on RLE. Superficial area on LLE, one raised area. there is an indurated area on her L thigh as well as her L buttock.    Results for orders placed or performed during the hospital encounter of 12/04/15 (from the past 48 hour(s))  Culture, blood (routine x 2)     Status: None (Preliminary result)   Collection Time: 12/08/15 11:18 PM  Result Value Ref Range   Specimen Description BLOOD LEFT HAND    Special Requests BOTTLES DRAWN AEROBIC AND ANAEROBIC    Culture NO GROWTH 1 DAY    Report Status PENDING   Culture, blood (routine x 2)     Status: None (Preliminary result)   Collection Time: 12/08/15 11:20 PM  Result Value Ref Range   Specimen Description BLOOD RIGHT ARM    Special Requests BOTTLES DRAWN AEROBIC AND ANAEROBIC    Culture NO GROWTH 1 DAY    Report Status PENDING   CBC     Status: Abnormal   Collection Time: 12/09/15 12:20 PM  Result Value Ref Range   WBC 7.6 4.0 - 10.5 K/uL   RBC 3.07 (L) 3.87 - 5.11 MIL/uL   Hemoglobin 9.1 (L) 12.0 - 15.0 g/dL   HCT  16.1 (L) 09.6 - 46.0 %   MCV 95.4 78.0 - 100.0 fL   MCH 29.6 26.0 - 34.0 pg   MCHC 31.1 30.0 - 36.0 g/dL   RDW 04.5 40.9 - 81.1 %   Platelets 406 (H) 150 - 400 K/uL      Component Value Date/Time   SDES BLOOD RIGHT ARM 12/08/2015 2320   SPECREQUEST BOTTLES DRAWN  AEROBIC AND ANAEROBIC 12/08/2015 2320   CULT NO GROWTH 1 DAY 12/08/2015 2320   REPTSTATUS PENDING 12/08/2015 2320   Dg Chest 2 View  12/09/2015  CLINICAL DATA:  Bilateral neck swelling. Shortness of breath, fever, wheezing and cough EXAM: CHEST  2 VIEW COMPARISON:  07/19/2010 FINDINGS: Mild cardiac enlargement. There are multiple opacities identified within the right upper and right mid lung. Left lung is clear. No pleural effusion or edema. IMPRESSION: 1. Asymmetric opacities in the right lung are noted which may represent asymmetric edema or pneumonia. Followup PA and lateral chest X-ray is recommended in 3-4 weeks following trial of antibiotic therapy to ensure resolution and exclude underlying malignancy. Electronically Signed   By: Signa Kell M.D.   On: 12/09/2015 08:35   Recent Results (from the past 240 hour(s))  Wound culture     Status: None   Collection Time: 12/03/15  6:03 PM  Result Value Ref Range Status   Gram Stain Rare  Final   Gram Stain WBC present-predominately PMN  Final   Gram Stain No Squamous Epithelial Cells Seen  Final   Gram Stain No Organisms Seen  Final   Organism ID, Bacteria NO GROWTH 2 DAYS  Final  Wound culture     Status: None   Collection Time: 12/04/15 10:58 PM  Result Value Ref Range Status   Specimen Description ABSCESS  Final   Special Requests RIGHT LEG  Final   Gram Stain   Final    MODERATE WBC PRESENT,BOTH PMN AND MONONUCLEAR NO SQUAMOUS EPITHELIAL CELLS SEEN NO ORGANISMS SEEN Performed at [facility]    Culture   Final    NO GROWTH 2 DAYS Performed at [facility]    Report Status 12/07/2015 FINAL  Final  Culture, blood (routine x 2)     Status: None    Collection Time: 12/05/15  2:10 PM  Result Value Ref Range Status   Specimen Description BLOOD LEFT ANTECUBITAL  Final   Special Requests BOTTLES DRAWN AEROBIC AND ANAEROBIC 10CC  Final   Culture NO GROWTH 5 DAYS  Final   Report Status 12/10/2015 FINAL  Final  Culture, blood (routine x 2)     Status: None   Collection Time: 12/05/15  2:20 PM  Result Value Ref Range Status   Specimen Description BLOOD RIGHT ANTECUBITAL  Final   Special Requests BOTTLES DRAWN AEROBIC AND ANAEROBIC 10CC  Final   Culture NO GROWTH 5 DAYS  Final   Report Status 12/10/2015 FINAL  Final  Culture, blood (routine x 2)     Status: None (Preliminary result)   Collection Time: 12/08/15 11:18 PM  Result Value Ref Range Status   Specimen Description BLOOD LEFT HAND  Final   Special Requests BOTTLES DRAWN AEROBIC AND ANAEROBIC  Final   Culture NO GROWTH 1 DAY  Final   Report Status PENDING  Incomplete  Culture, blood (routine x 2)     Status: None (Preliminary result)   Collection Time: 12/08/15 11:20 PM  Result Value Ref Range Status   Specimen Description BLOOD RIGHT ARM  Final   Special Requests BOTTLES DRAWN AEROBIC AND ANAEROBIC  Final   Culture NO GROWTH 1 DAY  Final   Report Status PENDING  Incomplete      12/10/2015, 2:34 PM     LOS: 5 days    Records and images were personally reviewed where available.

## 2015-12-10 NOTE — Care Management Note (Signed)
Case Management Note  Patient Details  Name: [name] MRN: [id number] Date of Birth: 12-05-1955  Subjective/Objective:                    Action/Plan:   Expected Discharge Date:                  Expected Discharge Plan:  Home/Self Care  In-House Referral:     Discharge planning Services     Post Acute Care Choice:    Choice offered to:     DME Arranged:    DME Agency:     HH Arranged:    HH Agency:     Status of Service:  In process, will continue to follow  Medicare Important Message Given:    Date Medicare IM Given:    Medicare IM give by:    Date Additional Medicare IM Given:    Additional Medicare Important Message give by:     If discussed at Long Length of Stay Meetings, dates discussed:  12-10-15 UR updated   Additional Comments:  Kingsley Plan, RN 12/10/2015, 11:02 AM

## 2015-12-10 NOTE — Progress Notes (Signed)
Family Medicine Teaching Service Daily Progress Note Intern Pager: [phone]  Patient name: [name] Medical record number: [id number] Date of birth: 30-Apr-1956 Age: 60 y.o. Gender: female  Primary Care Provider: Norberto Sorenson, MD Consultants: Surgery, WOC, ID Code Status: Full  Assessment and Plan: Mrs. Klingbeil is a 60yo female presenting with lower extremity swelling and redness. PMH includes depression, osteoarthritis, anemia, OSA (not on CPAP), obesity s/p gastric bypass surgery in 2002, venous insufficiency  # Cellulitis with Abscesses: Bilateral lower extremities, right worse than left. Multiple I&Ds performed. Failed courses of Augmentin, Doxycycline, and Bactrim. qSOFA score 0. Lactic Acid normal. HIV nonreactive. Surgery consulted, who recommended no further I&Ds at this time, and continuing abx with heating packs  WOC recommended outpatient dermatology f/u. Initial blood cx and wound cx with no growth. CXR (2/15) with asymmetric opacities in the R lung which may represent PNA vs edema. Patient seen by ID yesterday, who recommended rheum work-up and imaging of RLE, as she was exhibiting pain on calf flexion. Rh factor elevated at 17.8. Afebrile overnight.  - Area of cellulitis demarcated. Monitor daily for improvement. - Cont Ancef (2/15>) - F/u repeat blood cx (2/14) - NGx1d - ID consult - appreciate recs  - Consider biopsy per ID recs - F/u RLE [place] - F/u ANA, ANCA - As erythema of R leg continues to worsen, consider broadening antibiotic coverage  # Elevated Creatinine: Creatinine elevated to 1.06 at admission, baseline 0.9. Improved to 0.74 (2/17). - Continue to monitor  # Hyperglycemia: Glucose elevated to 137. A1C 5.4 (2/11). 100 this AM.  - Continue to monitor. Consider initiating sliding scale if remains elevated.   # Anemia: Hgb 9 at admission. Anemia panel shows iron low at 11 and saturation low at 4, indicating iron deficiency as etiology of anemia. Hgb 9.2 (2/17).  -  Initiate Ferrous sulfate  - Miralax PRN constipation  # Depression:  Home medications include Cymbalta , Fluoxetine , and Trazodone . - Continue home medications.  FEN/GI: Heart Healthy PPx: Lovenox  Disposition: Home pending clinical improvement  Subjective:  Patient reports increased tenderness in her R lower leg, as well as her L buttocks this AM. She does not feel that she is improving. She was afebrile overnight and is breathing well on room air with minimal cough.   Objective: Temp:  [97 F (36.1 C)-98.9 F (37.2 C)] 97 F (36.1 C) (02/17 0624) Pulse Rate:  [82-93] 82 (02/17 0624) Resp:  [17] 17 (02/17 0624) BP: (129-131)/(69-82) 131/82 mmHg (02/17 0624) SpO2:  [92 %-93 %] 93 % (02/17 0624) Physical Exam: General: sleeping in bed comfortably but easily able to arouse; in NAD Cardiovascular: Distant heart sounds likely 2/2 habitus; normal rate, no murmurs appreciated Respiratory: Scattered wheezes, good air movement, normal work of breathing on RA Abdomen: Soft, non-tender, non-distended, +BS Skin: Warmth, erythema, and edema of bilateral lower extremities R worse than L; multiple draining abscesses noted as well as nonfluctuant nondraining nodules in upper left thigh and left buttock; erythema of R lower leg cellulitis continues to wrosen  Laboratory:  Recent Labs Lab 12/08/15 0507 12/09/15 1220 12/11/15 0745  WBC 7.5 7.6 7.8  HGB 8.8* 9.1* 9.2*  HCT 28.9* 29.3* 28.9*  PLT 365 406* 468*    Recent Labs Lab 12/04/15 2330 12/05/15 0408 12/06/15 0549 12/08/15 0507 12/11/15 0745  NA 136 135 139 136 141  K 3.9 3.8 4.0 3.8 3.7  CL 98* 98* 104 101 99*  CO2 32  BUN 5*  CREATININE 0.92 1.06* 0.99 0.89 0.74  CALCIUM 8.7* 8.6* 8.8* 8.6* 8.7*  PROT 6.2* 6.2*  --   --   --   BILITOT 0.4 0.4  --   --   --   ALKPHOS 78 79  --   --   --   ALT 11* 12*  --   --   --   AST 16 15  --   --   --   GLUCOSE 103* 137* 118* 112* 100*  -  Anemia Panel: Iron 11, TIBC 305, Saturation 4, Ferritin 66  Marquette Saa, MD 12/11/2015, 9:30 AM PGY-1, [facility] Family Medicine FPTS Intern pager: [phone], text pages welcome

## 2015-12-11 ENCOUNTER — Inpatient Hospital Stay (HOSPITAL_COMMUNITY): Payer: BC Managed Care – PPO

## 2015-12-11 DIAGNOSIS — L4050 Arthropathic psoriasis, unspecified: Secondary | ICD-10-CM | POA: Insufficient documentation

## 2015-12-11 DIAGNOSIS — M79661 Pain in right lower leg: Secondary | ICD-10-CM

## 2015-12-11 LAB — BASIC METABOLIC PANEL
Anion gap: 10 (ref 5–15)
BUN: 5 mg/dL — AB (ref 6–20)
CALCIUM: 8.7 mg/dL — AB (ref 8.9–10.3)
CO2: 32 mmol/L (ref 22–32)
Chloride: 99 mmol/L — ABNORMAL LOW (ref 101–111)
Creatinine, Ser: 0.74 mg/dL (ref 0.44–1.00)
GFR calc Af Amer: >60 mL/min (ref >60)
GLUCOSE: 100 mg/dL — AB (ref 65–99)
Potassium: 3.7 mmol/L (ref 3.5–5.1)
Sodium: 141 mmol/L (ref 135–145)

## 2015-12-11 LAB — CBC
HCT: 28.9 % — ABNORMAL LOW (ref 36.0–46.0)
Hemoglobin: 9.2 g/dL — ABNORMAL LOW (ref 12.0–15.0)
MCH: 30.6 pg (ref 26.0–34.0)
MCHC: 31.8 g/dL (ref 30.0–36.0)
MCV: 96 fL (ref 78.0–100.0)
PLATELETS: 468 10*3/uL — AB (ref 150–400)
RBC: 3.01 MIL/uL — ABNORMAL LOW (ref 3.87–5.11)
RDW: 14.3 % (ref 11.5–15.5)
WBC: 7.8 10*3/uL (ref 4.0–10.5)

## 2015-12-11 LAB — ANTINUCLEAR ANTIBODIES, IFA: ANTINUCLEAR ANTIBODIES, IFA: NEGATIVE

## 2015-12-11 LAB — RHEUMATOID FACTOR: Rhuematoid fact SerPl-aCnc: 17.8 IU/mL — ABNORMAL HIGH (ref 0.0–13.9)

## 2015-12-11 MED ORDER — CEFAZOLIN SODIUM-DEXTROSE 2-3 GM-% IV SOLR
2.0000 g | Freq: Three times a day (TID) | INTRAVENOUS | Status: DC
  Administered 2015-12-11 – 2015-12-12 (×3): 2 g via INTRAVENOUS
  Filled 2015-12-11 (×5): qty 50

## 2015-12-11 NOTE — Progress Notes (Signed)
INFECTIOUS DISEASE PROGRESS NOTE  ID: [name] is a 60 y.o. female with  Principal Problem:   Cellulitis of both lower extremities Active Problems:   Alcohol abuse   Cellulitis and abscess   Depression   Chronic anemia   Abscess of lower leg   Cough   Fever   Wheezing  Subjective: Frustrated over anbx gap No change in her leg States she has a hx of psoriatic arthritis (prev on prednisone, mtx, biologics?)  Abtx:  Anti-infectives    Start     Dose/Rate Route Frequency Ordered Stop   12/11/15 1230  ceFAZolin (ANCEF) IVPB 2 g/50 mL premix     2 g 100 mL/hr over 30 Minutes Intravenous 3 times per day 12/11/15 1154     12/06/15 1800  piperacillin-tazobactam (ZOSYN) IVPB 3.375 g  Status:  Discontinued     3.375 g 12.5 mL/hr over 240 Minutes Intravenous Every 8 hours 12/06/15 0938 12/09/15 1200   12/06/15 1000  piperacillin-tazobactam (ZOSYN) IVPB 3.375 g     3.375 g 100 mL/hr over 30 Minutes Intravenous  Once 12/06/15 0937 12/06/15 1121   12/05/15 1030  fluconazole (DIFLUCAN) tablet 150 mg     150 mg Oral  Once 12/05/15 1028 12/05/15 1103   12/05/15 1000  vancomycin (VANCOCIN) IVPB 1000 mg/200 mL premix  Status:  Discontinued     1,000 mg 200 mL/hr over 60 Minutes Intravenous Every 12 hours 12/05/15 0121 12/09/15 1200   12/04/15 2245  vancomycin (VANCOCIN) 2,000 mg in sodium chloride 0.9 % 500 mL IVPB     2,000 mg 250 mL/hr over 120 Minutes Intravenous  Once 12/04/15 2236 12/05/15 0059      Medications:  Scheduled: . acyclovir ointment   Topical Q4H  .  ceFAZolin (ANCEF) IV  2 g Intravenous 3 times per day  . DULoxetine  60 mg Oral Daily  . enoxaparin (LOVENOX) injection  60 mg Subcutaneous Q24H  . ferrous sulfate  325 mg Oral Q breakfast  . FLUoxetine  20 mg Oral BID  . hydrocortisone   Rectal BID  . multivitamin with minerals  1 tablet Oral Daily  . traZODone  50 mg Oral QHS    Objective: Vital signs in last 24 hours: Temp:  [97 F (36.1 C)-98.9 F  (37.2 C)] 97 F (36.1 C) (02/17 0624) Pulse Rate:  [82] 82 (02/17 0624) Resp:  [17] 17 (02/17 0624) BP: (129-131)/(69-82) 131/82 mmHg (02/17 0624) SpO2:  [92 %-93 %] 93 % (02/17 0624)   General appearance: alert, cooperative and no distress Incision/Wound: wound is unchanged, RLE is tender, warm. Erythema retreated from line of demarcation.   Lab Results  Recent Labs  12/09/15 1220 12/11/15 0745  WBC 7.6 7.8  HGB 9.1* 9.2*  HCT 29.3* 28.9*  NA  --  141  K  --  3.7  CL  --  99*  CO2  --  32  BUN  --  5*  CREATININE  --  0.74   Liver Panel No results for input(s): PROT, ALBUMIN, AST, ALT, ALKPHOS, BILITOT, BILIDIR, IBILI in the last 72 hours. Sedimentation Rate No results for input(s): ESRSEDRATE in the last 72 hours. C-Reactive Protein No results for input(s): CRP in the last 72 hours.  Microbiology: Recent Results (from the past 240 hour(s))  Wound culture     Status: None   Collection Time: 12/03/15  6:03 PM  Result Value Ref Range Status   Gram Stain Rare  Final   Gram  Stain WBC present-predominately PMN  Final   Gram Stain No Squamous Epithelial Cells Seen  Final   Gram Stain No Organisms Seen  Final   Organism ID, Bacteria NO GROWTH 2 DAYS  Final  Wound culture     Status: None   Collection Time: 12/04/15 10:58 PM  Result Value Ref Range Status   Specimen Description ABSCESS  Final   Special Requests RIGHT LEG  Final   Gram Stain   Final    MODERATE WBC PRESENT,BOTH PMN AND MONONUCLEAR NO SQUAMOUS EPITHELIAL CELLS SEEN NO ORGANISMS SEEN Performed at [facility]    Culture   Final    NO GROWTH 2 DAYS Performed at [facility]    Report Status 12/07/2015 FINAL  Final  Culture, blood (routine x 2)     Status: None   Collection Time: 12/05/15  2:10 PM  Result Value Ref Range Status   Specimen Description BLOOD LEFT ANTECUBITAL  Final   Special Requests BOTTLES DRAWN AEROBIC AND ANAEROBIC 10CC  Final   Culture NO GROWTH 5 DAYS  Final     Report Status 12/10/2015 FINAL  Final  Culture, blood (routine x 2)     Status: None   Collection Time: 12/05/15  2:20 PM  Result Value Ref Range Status   Specimen Description BLOOD RIGHT ANTECUBITAL  Final   Special Requests BOTTLES DRAWN AEROBIC AND ANAEROBIC 10CC  Final   Culture NO GROWTH 5 DAYS  Final   Report Status 12/10/2015 FINAL  Final  Culture, blood (routine x 2)     Status: None (Preliminary result)   Collection Time: 12/08/15 11:18 PM  Result Value Ref Range Status   Specimen Description BLOOD LEFT HAND  Final   Special Requests BOTTLES DRAWN AEROBIC AND ANAEROBIC  Final   Culture NO GROWTH 1 DAY  Final   Report Status PENDING  Incomplete  Culture, blood (routine x 2)     Status: None (Preliminary result)   Collection Time: 12/08/15 11:20 PM  Result Value Ref Range Status   Specimen Description BLOOD RIGHT ARM  Final   Special Requests BOTTLES DRAWN AEROBIC AND ANAEROBIC  Final   Culture NO GROWTH 1 DAY  Final   Report Status PENDING  Incomplete    Studies/Results: No results found.   Assessment/Plan: BLE abscess, cellulitis Would consider bx.  Resume anbx/ ancef  Psoriatic arthritis +Rheumatoid factor Consider asking rheum to comment (RF seen ~10% of psoriatic pt's) Not clear if these lesions are related to above  Chronic venous stasis Prev injections, no prev stripping.   Constipation  Total days of antibiotics: 5 (1 day gap) ancef         Johny Sax Infectious Diseases (pager) [phone] [email or web address] 12/11/2015, 1:28 PM  LOS: 6 days

## 2015-12-11 NOTE — Progress Notes (Signed)
VASCULAR LAB PRELIMINARY  PRELIMINARY  PRELIMINARY  PRELIMINARY  Right lower extremity venous doppler has been completed.  Right:  No evidence of DVT, superficial thrombosis, or Baker's cyst.  Finding-Lymph node in right groin area.  Jenetta Loges, RVT, RDMS 12/11/2015, 4:07 PM

## 2015-12-12 LAB — CREATININE, SERUM
Creatinine, Ser: 0.79 mg/dL (ref 0.44–1.00)
GFR calc non Af Amer: >60 mL/min (ref >60)

## 2015-12-12 MED ORDER — FLUCONAZOLE 100 MG PO TABS
150.0000 mg | ORAL_TABLET | Freq: Once | ORAL | Status: AC
  Administered 2015-12-12: 150 mg via ORAL
  Filled 2015-12-12: qty 2

## 2015-12-12 MED ORDER — CEPHALEXIN 500 MG PO CAPS
1000.0000 mg | ORAL_CAPSULE | Freq: Two times a day (BID) | ORAL | Status: DC
  Administered 2015-12-12 – 2015-12-13 (×2): 1000 mg via ORAL
  Filled 2015-12-12 (×3): qty 2

## 2015-12-12 MED ORDER — CLINDAMYCIN HCL 300 MG PO CAPS
450.0000 mg | ORAL_CAPSULE | Freq: Four times a day (QID) | ORAL | Status: DC
  Filled 2015-12-12 (×4): qty 1

## 2015-12-12 NOTE — Progress Notes (Signed)
Family Medicine Teaching Service Daily Progress Note Intern Pager: [phone]  Patient name: [name] Medical record number: [id number] Date of birth: 1955/10/26 Age: 60 y.o. Gender: female  Primary Care Provider: Norberto Sorenson, MD Consultants: Surgery, WOC, ID Code Status: Full  Assessment and Plan: [name] is a 60yo female presenting with lower extremity swelling and redness. PMH includes depression, osteoarthritis, anemia, OSA (not on CPAP), obesity s/p gastric bypass surgery in 2002, venous insufficiency  # Cellulitis with Abscesses: Bilateral lower extremities, right worse than left. Multiple I&Ds performed. Failed courses of Augmentin, Doxycycline, and Bactrim. HIV nonreactive. Surgery consulted, who recommended no further I&Ds at this time, and continuing abx with heating packs  WOC recommended outpatient dermatology f/u. Initial blood cx and wound cx with no growth. CXR (2/15) with asymmetric opacities in the R lung which may represent PNA vs edema. LE doppler negative. Per ID recs, patient transitioned to PO Keflex yesterday. Remains afebrile.  - Area of cellulitis demarcated. Monitor daily for improvement. - F/u with wound care and dermatology outpatient - F/u repeat blood cx (2/14) - NGx3d - ID consult - appreciate recs  - Continue Keflex 1g BID  # Elevated Creatinine: Creatinine elevated to 1.06 at admission, baseline 0.9. Improved to 0.74 (2/17). - Continue to monitor  # Hyperglycemia: Glucose elevated to 137. A1C 5.4 (2/11). - Continue to monitor. Consider initiating sliding scale.   # Anemia: Hgb 9 at admission. Anemia panel shows iron low at 11 and saturation low at 4, indicating iron deficiency as etiology of anemia. Hgb 9.2 (2/17).  - Ferrous sulfate  - Miralax PRN constipation  # Depression:  Home medications include Cymbalta , Fluoxetine , and Trazodone . - Continue home medications  FEN/GI: Heart Healthy PPx: Lovenox  Disposition: Home pending  clinical improvement  Subjective:  No acute events overnight. Patient endorses HA but thinks this is due to not drinking as much caffeine as she typically does. Reports some discomfort in her legs, but says that is improving. She is ready to go home today.   Objective: Temp:  [98.6 F (37 C)-99 F (37.2 C)] 98.6 F (37 C) (02/19 0618) Pulse Rate:  [85-92] 85 (02/19 0618) Resp:  [17-18] 17 (02/19 0618) BP: (136-148)/(77-79) 137/77 mmHg (02/19 0618) SpO2:  [90 %-100 %] 92 % (02/19 0618) Physical Exam General: resting comfortably in bed, in NAD Cardiovascular: RRR, no murmurs appreciated Respiratory: faint wheezes scattered throughout; normal work of breathing on RA Abdomen: Soft, non-tender, non-distended, +BS Skin: Warmth, erythema, and edema of bilateral lower extremities R worse than L; multiple draining abscesses noted as well as nonfluctuant, erythematous, indurated nondraining nodule in upper left buttock; erythema of R lower leg cellulitis unchanged from yesterday  Laboratory:  Recent Labs Lab 12/08/15 0507 12/09/15 1220 12/11/15 0745  WBC 7.5 7.6 7.8  HGB 8.8* 9.1* 9.2*  HCT 28.9* 29.3* 28.9*  PLT 365 406* 468*    Recent Labs Lab 12/08/15 0507 12/11/15 0745 12/12/15 0546  NA 136 141  --   K 3.8 3.7  --   CL 101 99*  --   CO2 24 32  --   BUN 6 5*  --   CREATININE 0.89 0.74 0.79  CALCIUM 8.6* 8.7*  --   GLUCOSE 112* 100*  --   - Anemia Panel: Iron 11, TIBC 305, Saturation 4, Ferritin 66  Sydney Saa, MD 12/13/2015, 7:19 AM PGY-1, [place] [facility] FPTS Intern pager: [phone], text pages welcome

## 2015-12-12 NOTE — Progress Notes (Signed)
INFECTIOUS DISEASE PROGRESS NOTE  ID: [name] is a 60 y.o. female with  Principal Problem:   Cellulitis of both lower extremities Active Problems:   Alcohol abuse   Cellulitis and abscess   Depression   Chronic anemia   Abscess of lower leg   Cough   Fever   Wheezing   Psoriatic arthritis (HCC)  Subjective: Feels poorly.  L buttock lesion tender, swollen.   Abtx:  Anti-infectives    Start     Dose/Rate Route Frequency Ordered Stop   12/12/15 1200  clindamycin (CLEOCIN) capsule 450 mg     450 mg Oral 4 times per day 12/12/15 1025     12/11/15 1230  ceFAZolin (ANCEF) IVPB 2 g/50 mL premix  Status:  Discontinued     2 g 100 mL/hr over 30 Minutes Intravenous 3 times per day 12/11/15 1154 12/12/15 1025   12/06/15 1800  piperacillin-tazobactam (ZOSYN) IVPB 3.375 g  Status:  Discontinued     3.375 g 12.5 mL/hr over 240 Minutes Intravenous Every 8 hours 12/06/15 0938 12/09/15 1200   12/06/15 1000  piperacillin-tazobactam (ZOSYN) IVPB 3.375 g     3.375 g 100 mL/hr over 30 Minutes Intravenous  Once 12/06/15 0937 12/06/15 1121   12/05/15 1030  fluconazole (DIFLUCAN) tablet 150 mg     150 mg Oral  Once 12/05/15 1028 12/05/15 1103   12/05/15 1000  vancomycin (VANCOCIN) IVPB 1000 mg/200 mL premix  Status:  Discontinued     1,000 mg 200 mL/hr over 60 Minutes Intravenous Every 12 hours 12/05/15 0121 12/09/15 1200   12/04/15 2245  vancomycin (VANCOCIN) 2,000 mg in sodium chloride 0.9 % 500 mL IVPB     2,000 mg 250 mL/hr over 120 Minutes Intravenous  Once 12/04/15 2236 12/05/15 0059      Medications:  Scheduled: . acyclovir ointment   Topical Q4H  . cephALEXin  1,000 mg Oral Q12H  . DULoxetine  60 mg Oral Daily  . enoxaparin (LOVENOX) injection  60 mg Subcutaneous Q24H  . ferrous sulfate  325 mg Oral Q breakfast  . FLUoxetine  20 mg Oral BID  . hydrocortisone   Rectal BID  . multivitamin with minerals  1 tablet Oral Daily  . traZODone  50 mg Oral QHS     Objective: Vital signs in last 24 hours: Temp:  [98.6 F (37 C)-98.8 F (37.1 C)] 98.6 F (37 C) (02/18 0530) Pulse Rate:  [73-102] 92 (02/18 1355) Resp:  [18] 18 (02/18 1355) BP: (136-143)/(57-79) 136/79 mmHg (02/18 1355) SpO2:  [93 %-100 %] 100 % (02/18 1355)   General appearance: alert, cooperative and no distress Skin: wounds on RLE are unchanged. LLE wound is improved. L buttock lesion is tender, indurated. no skin breakdown.   Lab Results  Recent Labs  12/11/15 0745 12/12/15 0546  WBC 7.8  --   HGB 9.2*  --   HCT 28.9*  --   NA 141  --   K 3.7  --   CL 99*  --   CO2 32  --   BUN 5*  --   CREATININE 0.74 0.79   Liver Panel No results for input(s): PROT, ALBUMIN, AST, ALT, ALKPHOS, BILITOT, BILIDIR, IBILI in the last 72 hours. Sedimentation Rate No results for input(s): ESRSEDRATE in the last 72 hours. C-Reactive Protein No results for input(s): CRP in the last 72 hours.  Microbiology: Recent Results (from the past 240 hour(s))  Wound culture     Status: None  Collection Time: 12/03/15  6:03 PM  Result Value Ref Range Status   Gram Stain Rare  Final   Gram Stain WBC present-predominately PMN  Final   Gram Stain No Squamous Epithelial Cells Seen  Final   Gram Stain No Organisms Seen  Final   Organism ID, Bacteria NO GROWTH 2 DAYS  Final  Wound culture     Status: None   Collection Time: 12/04/15 10:58 PM  Result Value Ref Range Status   Specimen Description ABSCESS  Final   Special Requests RIGHT LEG  Final   Gram Stain   Final    MODERATE WBC PRESENT,BOTH PMN AND MONONUCLEAR NO SQUAMOUS EPITHELIAL CELLS SEEN NO ORGANISMS SEEN Performed at [facility]    Culture   Final    NO GROWTH 2 DAYS Performed at [facility]    Report Status 12/07/2015 FINAL  Final  Culture, blood (routine x 2)     Status: None   Collection Time: 12/05/15  2:10 PM  Result Value Ref Range Status   Specimen Description BLOOD LEFT ANTECUBITAL  Final    Special Requests BOTTLES DRAWN AEROBIC AND ANAEROBIC 10CC  Final   Culture NO GROWTH 5 DAYS  Final   Report Status 12/10/2015 FINAL  Final  Culture, blood (routine x 2)     Status: None   Collection Time: 12/05/15  2:20 PM  Result Value Ref Range Status   Specimen Description BLOOD RIGHT ANTECUBITAL  Final   Special Requests BOTTLES DRAWN AEROBIC AND ANAEROBIC 10CC  Final   Culture NO GROWTH 5 DAYS  Final   Report Status 12/10/2015 FINAL  Final  Culture, blood (routine x 2)     Status: None (Preliminary result)   Collection Time: 12/08/15 11:18 PM  Result Value Ref Range Status   Specimen Description BLOOD LEFT HAND  Final   Special Requests BOTTLES DRAWN AEROBIC AND ANAEROBIC  Final   Culture NO GROWTH 3 DAYS  Final   Report Status PENDING  Incomplete  Culture, blood (routine x 2)     Status: None (Preliminary result)   Collection Time: 12/08/15 11:20 PM  Result Value Ref Range Status   Specimen Description BLOOD RIGHT ARM  Final   Special Requests BOTTLES DRAWN AEROBIC AND ANAEROBIC  Final   Culture NO GROWTH 3 DAYS  Final   Report Status PENDING  Incomplete    Studies/Results: No results found.   Assessment/Plan: BLE cellutlitis  Would NOT use clinda po It is VERY high risk for C diff which is a huge problem in our community.  Please give pt keflex 1g bid Please have her f/u with derm as out pt Could consider checking her Ig levels for eval for CVID Warm compresses for her L buttock lesion  Psoriatic arthritis +RF  Total days of antibiotics: 6 ancef  available as needed.          Johny Sax Infectious Diseases (pager) [phone] [email or web address] 12/12/2015, 3:15 PM  LOS: 7 days

## 2015-12-12 NOTE — Progress Notes (Signed)
Family Medicine Teaching Service Daily Progress Note Intern Pager: [phone]  Patient name: [name] Medical record number: [id number] Date of birth: 1956/03/08 Age: 60 y.o. Gender: female  Primary Care Provider: Norberto Sorenson, MD Consultants: Surgery, WOC, ID Code Status: Full  Assessment and Plan: [name] is a 60yo female presenting with lower extremity swelling and redness. PMH includes depression, osteoarthritis, anemia, OSA (not on CPAP), obesity s/p gastric bypass surgery in 2002, venous insufficiency  # Cellulitis with Abscesses: Bilateral lower extremities, right worse than left. Multiple I&Ds performed. Failed courses of Augmentin, Doxycycline, and Bactrim. qSOFA score 0. Lactic Acid normal. HIV nonreactive. Surgery consulted, who recommended no further I&Ds at this time, and continuing abx with heating packs  WOC recommended outpatient dermatology f/u. Initial blood cx and wound cx with no growth. CXR (2/15) with asymmetric opacities in the R lung which may represent PNA vs edema. LE doppler negative. ANA normal - Area of cellulitis demarcated. Monitor daily for improvement. - discontinue Ancef (2/17>2/18) - start clindamycin  QID - follow-up with wound care and dermatology outpatient - F/u repeat blood cx (2/14) - NGx2 days - ID consult - appreciate recs  - Consider biopsy per ID recs - F/u ANCA - As erythema of R leg continues to worsen, consider broadening antibiotic coverage  # Elevated Creatinine: Creatinine elevated to 1.06 at admission, baseline 0.9. Improved to 0.74 (2/17). - Continue to monitor  # Hyperglycemia: Glucose elevated to 137. A1C 5.4 (2/11). 100 yesterday morning.  - Continue to monitor. Consider initiating sliding scale.   # Anemia: Hgb 9 at admission. Anemia panel shows iron low at 11 and saturation low at 4, indicating iron deficiency as etiology of anemia. Hgb 9.2 (2/17).  - ferrous sulfate  - Miralax PRN constipation  # Depression:  Home  medications include Cymbalta , Fluoxetine , and Trazodone . - Continue home medications  FEN/GI: Heart Healthy PPx: Lovenox  Disposition: Home pending clinical improvement  Subjective:  Patient reports increased tenderness in her R lower leg, as well as her L buttocks this AM. She does not feel that she is improving. She was afebrile overnight and is breathing well on room air with minimal cough.   Objective: Temp:  [98.4 F (36.9 C)-98.8 F (37.1 C)] 98.6 F (37 C) (02/18 0530) Pulse Rate:  [73-102] 73 (02/18 0530) Resp:  [18] 18 (02/18 0530) BP: (134-143)/(57-70) 143/70 mmHg (02/18 0530) SpO2:  [93 %-100 %] 93 % (02/18 0530) Physical Exam: General: lying down, watching TV, no distress Cardiovascular: Regular rate and rhythm. Normal S1 and S2. No heart murmurs present. No extra heart sounds Respiratory: transmitted wheezing heard bilaterally, otherwise sounds normal with normal work of breathing Abdomen: Soft, non-tender, non-distended, +BS Skin: Warmth, erythema, and edema of bilateral lower extremities R worse than L; multiple draining abscesses noted as well as nonfluctuant, erythematous, indurated nondraining nodule in upper left buttock; erythema of R lower leg cellulitis continues to be creeping up to initial marker  Laboratory:  Recent Labs Lab 12/08/15 0507 12/09/15 1220 12/11/15 0745  WBC 7.5 7.6 7.8  HGB 8.8* 9.1* 9.2*  HCT 28.9* 29.3* 28.9*  PLT 365 406* 468*    Recent Labs Lab 12/06/15 0549 12/08/15 0507 12/11/15 0745 12/12/15 0546  NA 139 136 141  --   K 4.0 3.8 3.7  --   CL 104 101 99*  --   CO2 24 24 32  --   BUN 8 6 5*  --   CREATININE 0.99 0.89 0.74  0.79  CALCIUM 8.8* 8.6* 8.7*  --   GLUCOSE 118* 112* 100*  --   - Anemia Panel: Iron 11, TIBC 305, Saturation 4, Ferritin 66  Sydney Bonds, MD 12/12/2015, 7:19 AM PGY-3, [facility] FPTS Intern pager: [phone], text pages welcome

## 2015-12-13 DIAGNOSIS — M79669 Pain in unspecified lower leg: Secondary | ICD-10-CM | POA: Insufficient documentation

## 2015-12-13 DIAGNOSIS — M79661 Pain in right lower leg: Secondary | ICD-10-CM

## 2015-12-13 MED ORDER — ACETAMINOPHEN 325 MG PO TABS
650.0000 mg | ORAL_TABLET | Freq: Once | ORAL | Status: AC
  Administered 2015-12-13: 650 mg via ORAL
  Filled 2015-12-13: qty 2

## 2015-12-13 MED ORDER — FLUCONAZOLE 150 MG PO TABS: 150.0000 mg | ORAL_TABLET | Freq: Every day | ORAL | Status: DC

## 2015-12-13 MED ORDER — HYDROCODONE-ACETAMINOPHEN 5-325 MG PO TABS: 1.0000 | ORAL_TABLET | Freq: Four times a day (QID) | ORAL | Status: DC | PRN

## 2015-12-13 MED ORDER — CEPHALEXIN 500 MG PO CAPS: 1000.0000 mg | ORAL_CAPSULE | Freq: Two times a day (BID) | ORAL | Status: AC

## 2015-12-13 NOTE — Discharge Instructions (Signed)
You were admitted to cellulitis (skin infection) and abscesses of your legs. You improved with antibiotics.   Please continue to take the antibiotic (Keflex) once tonight, then twice a day for the next 13 days (last dose 12/26/15).   It is also important to schedule an appointment with your primarily doctor within one week to make sure you are improving. Also, it is VERY important to schedule an appointment with a dermatologist as soon as possible. Your primary doctor can recommend a dermatologist if you wish.

## 2015-12-13 NOTE — Progress Notes (Signed)
Pt requesting plain tylenol order for HA this am.

## 2015-12-13 NOTE — Progress Notes (Signed)
Pt ready for DC accomp by husband.  Copy of DC instructions given to pt and explained.  Rx for Norco given and explained.  Rx for keflex and diflucan sent electronically to [facility].

## 2015-12-14 ENCOUNTER — Other Ambulatory Visit: Payer: Self-pay | Admitting: Physician Assistant

## 2015-12-14 LAB — CULTURE, BLOOD (ROUTINE X 2)
CULTURE: NO GROWTH
CULTURE: NO GROWTH

## 2015-12-14 LAB — ANCA TITERS: C-ANCA: <1:20 {titer}

## 2016-01-05 ENCOUNTER — Ambulatory Visit (INDEPENDENT_AMBULATORY_CARE_PROVIDER_SITE_OTHER): Payer: BC Managed Care – PPO | Admitting: Family Medicine

## 2016-01-05 VITALS — BP 126/84 | HR 94 | Temp 98.6°F | Resp 18 | Wt 280.0 lb

## 2016-01-05 DIAGNOSIS — L03116 Cellulitis of left lower limb: Secondary | ICD-10-CM

## 2016-01-05 MED ORDER — TRETINOIN 0.025 % EX CREA: TOPICAL_CREAM | Freq: Every day | CUTANEOUS | Status: DC

## 2016-01-05 MED ORDER — MUPIROCIN 2 % EX OINT: 1.0000 "application " | TOPICAL_OINTMENT | Freq: Three times a day (TID) | CUTANEOUS | Status: DC

## 2016-01-05 MED ORDER — SULFAMETHOXAZOLE-TRIMETHOPRIM 800-160 MG PO TABS: 1.0000 | ORAL_TABLET | Freq: Two times a day (BID) | ORAL | Status: DC

## 2016-01-05 MED ORDER — FLUCONAZOLE 150 MG PO TABS: 150.0000 mg | ORAL_TABLET | Freq: Every day | ORAL | Status: DC

## 2016-01-05 MED ORDER — CEFTRIAXONE SODIUM 1 G IJ SOLR
1.0000 g | Freq: Once | INTRAMUSCULAR | Status: AC
  Administered 2016-01-05: 1 g via INTRAMUSCULAR

## 2016-01-05 MED ORDER — CEPHALEXIN 500 MG PO CAPS: 500.0000 mg | ORAL_CAPSULE | Freq: Four times a day (QID) | ORAL | Status: DC

## 2016-01-05 NOTE — Patient Instructions (Addendum)
     IF you received an x-ray today, you will receive an invoice from [facility]. Please contact [facility] at [phone] with questions or concerns regarding your invoice.   IF you received labwork today, you will receive an invoice from [facility]. Please contact [facility] at [phone] with questions or concerns regarding your invoice.   Our billing staff will not be able to assist you with questions regarding bills from these companies.  You will be contacted with the lab results as soon as they are available. The fastest way to get your results is to activate your My Chart account. Instructions are located on the last page of this paperwork. If you have not heard from us regarding the results in 2 weeks, please contact this office.      Cellulitis Cellulitis is an infection of the skin and the tissue beneath it. The infected area is usually red and tender. Cellulitis occurs most often in the arms and lower legs.  CAUSES  Cellulitis is caused by bacteria that enter the skin through cracks or cuts in the skin. The most common types of bacteria that cause cellulitis are staphylococci and streptococci. SIGNS AND SYMPTOMS   Redness and warmth.  Swelling.  Tenderness or pain.  Fever. DIAGNOSIS  Your health care provider can usually determine what is wrong based on a physical exam. Blood tests may also be done. TREATMENT  Treatment usually involves taking an antibiotic medicine. HOME CARE INSTRUCTIONS   Take your antibiotic medicine as directed by your health care provider. Finish the antibiotic even if you start to feel better.  Keep the infected arm or leg elevated to reduce swelling.  Apply a warm cloth to the affected area up to 4 times per day to relieve pain.  Take medicines only as directed by your health care provider.  Keep all follow-up visits as directed by your health care provider. SEEK MEDICAL CARE IF:   You  notice red streaks coming from the infected area.  Your red area gets larger or turns dark in color.  Your bone or joint underneath the infected area becomes painful after the skin has healed.  Your infection returns in the same area or another area.  You notice a swollen bump in the infected area.  You develop new symptoms.  You have a fever. SEEK IMMEDIATE MEDICAL CARE IF:   You feel very sleepy.  You develop vomiting or diarrhea.  You have a general ill feeling (malaise) with muscle aches and pains.   This information is not intended to replace advice given to you by your health care provider. Make sure you discuss any questions you have with your health care provider.   Document Released: 07/20/2005 Document Revised: 07/01/2015 Document Reviewed: 12/26/2011 Elsevier Interactive Patient Education 2016 [employer]  

## 2016-01-05 NOTE — Progress Notes (Addendum)
Subjective:    Patient ID: Sydney HoffFonda T Eslick, female    DOB: 1956-08-30, 60 y.o.   MRN: [id number] By signing my name below, I, [name], attest that this documentation has been prepared under the direction and in the presence of Norberto SorensonEva Rukia Mcgillivray, MD.  Electronically Signed: Littie Deedsichard Berry, Medical Scribe. 01/05/2016. 5:08 PM.  Chief Complaint  Patient presents with  . Follow-up    for staph infection   HPI HPI Comments: [name] is a 60 y.o. female with a history of chronic venous insufficiency who presents to the [facility] complaining for a hospital follow-up. Patient was hospitalized from 2/10 - 2/19. She had had bilateral lower extremity cellulitis. She was seen here by one of my colleagues and started on doxycycline and Bactrim, which she did not respond to so we sent her to the ER. She had multiple abscesses identified and drained, and continued to develop more. [facility] and general surgery consult. Wound care recommended outpatient dermatology follow-up. She was placed on Vancomycin and Zosyn and still remained febrile, so ID was consulted. Patient was transitioned Ancef into clindamycin and into Keflex per ID recs. She was finally improved on PO Keflex and was sent home with 13 additional days for 14 day course. Last dose 12/26/15. She was noted to be iron-deficient anemic, started on daily iron supplements. She told the hospitalist she was taking hydrocodone 10 mg twice a day. She reportedly told the physician taking care of her that if she did not have her Norco, she would be forced to turn to illicit drugs such as heroin for pain control. She was advised to follow up with me within 1 week.  Patient notes she has a new, painful blister to her right ankle that started yesterday. This has been draining. Patient still has pain, warmth, swelling, and redness in her lower extremities, worse in the left leg. The pain is improved  with gentle rubbing to the area. The symptoms were worse in the right leg while she was hospitalized. She was not put on any fluid pills. She does not exercise. Patient denies GI symptoms. Patient sees a psychiatrist and is on duloxetine and fluoxetine.  Depression screen [facility] 01/05/2016 12/04/2015 12/03/2015  Decreased Interest 0 0 0  Down, Depressed, Hopeless 0 0 0  PHQ - 2 Score 0 0 0    Past Medical History  Diagnosis Date  . Depression   . Arthritis   . Seasonal allergies   . Anemia   . Obese   . Sleep apnea 2012    took test in [place]-told to use cpap-could not ude   Past Surgical History  Procedure Laterality Date  . Total knee revision  07/30/2010    right; with lateral release  . Anterior cervical decomp/discectomy fusion  05/09/2008    C5-6, C6-7; with arthrodesis also  . Rotator cuff repair w/ distal clavicle excision  01/06/2006    right  . 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 Total knee arthroplasty  11/07/2003    right  . Shoulder arthroscopy  2003    rt  . Knee arthroscopy  2004    rt and lt knee  . Shoulder arthroscopy  2003    rt  . Tonsillectomy    . Gastric bypass  2002   Current Outpatient Prescriptions on File Prior to Visit  Medication Sig Dispense Refill  . amphetamine-dextroamphetamine (ADDERALL  XR) 30 MG 24 hr capsule Take 30 mg by mouth 2 (two) times daily. Reported on 12/03/2015  0  . DULoxetine (CYMBALTA) 30 MG capsule Take 2 capsules by mouth daily.  1  . EPINEPHrine 0.3 mg/0.3 mL IJ SOAJ injection Inject 0.3 mLs (0.3 mg total) into the muscle once. 1 Device 2  . FLUoxetine (PROZAC) 20 MG capsule Take 20 mg by mouth 2 (two) times daily.     Marland Kitchen HYDROcodone-acetaminophen (NORCO) 5-325 MG tablet Take 1 tablet by mouth every 6 (six) hours as needed. 15 tablet 0  . traZODone (DESYREL) 50 MG tablet TAKE 50 MG BY MOUTH DAILY AT BEDTIME AS NEEDED FOR SLEEP     No current facility-administered medications on file prior to visit.   No Known  Allergies Family History  Problem Relation Age of Onset  . Heart disease Mother   . Cancer Father    Social History   Social History  . Marital Status: Married    Spouse Name: N/A  . Number of Children: N/A  . Years of Education: N/A   Social History Main Topics  . Smoking status: Former Smoker    Quit date: 05/16/1981  . Smokeless tobacco: None  . Alcohol Use: 3.0 oz/week    5 Cans of beer per week  . Drug Use: No  . Sexual Activity: Yes     Comment: INTERCOURSE AGE 30, SEXUAL PARTNERS MORE THAN 5   Other Topics Concern  . None   Social History Narrative     Review of Systems  Constitutional: Positive for diaphoresis, activity change and fatigue. Negative for fever, chills, appetite change and unexpected weight change.  Eyes: Negative for visual disturbance.  Cardiovascular: Positive for leg swelling.  Gastrointestinal: Negative.  Negative for vomiting and constipation.  Musculoskeletal: Positive for myalgias, back pain, joint swelling, arthralgias and gait problem.  Skin: Positive for color change and wound.  Allergic/Immunologic: Negative for immunocompromised state.  Hematological: Negative for adenopathy. Bruises/bleeds easily.  Psychiatric/Behavioral: Positive for sleep disturbance. Negative for dysphoric mood.       Objective:  BP 126/84 mmHg  Pulse 94  Temp(Src) 98.6 F (37 C) (Oral)  Resp 18  Wt 280 lb (127.007 kg)  SpO2 96%  Physical Exam  Constitutional: She is oriented to person, place, and time. She appears well-developed and well-nourished. No distress.  HENT:  Head: Normocephalic and atraumatic.  Mouth/Throat: Oropharynx is clear and moist. No oropharyngeal exudate.  Eyes: Pupils are equal, round, and reactive to light.  Neck: Neck supple.  Cardiovascular: Normal rate.   Pulmonary/Chest: Effort normal.  Musculoskeletal: She exhibits no edema.  Neurological: She is alert and oriented to person, place, and time. No cranial nerve deficit.    Skin: Skin is warm and dry. No rash noted.  Medial aspect of left lower extremity just proximal to ankle is about 15 cm in length bright red erythema, heat, tender. Medial aspect of right foot inferior to the malleolus is a 1 cm blister draining small amount of serosanguinous fluid and then some purulence as was milked. 1 cm round eschars spread over right lower leg.  Psychiatric: She has a normal mood and affect. Her behavior is normal.  Nursing note and vitals reviewed.         Assessment & Plan:   1. Cellulitis of left lower extremity   It is highly concerning that pt was hospitalized for 8d with prior cellulitis and now at her initial hosp f/u appt (pt did NOT f/u  o/p as she was instructed) her sxs have already clearly flaired/recurred.  Prior notes she had worsened when on BOTH doxy and  Bactrim. After several different IV antibiotics inpt she was finally discharged on Keflex.  All cultures form that time are negative.  So will cover pt with both keflex and bactrim after a dose of Rocephin today.  Recheck in 24-48 hrs.  Culture collected from spontaneously opened wound that had some purulent sanguinous drainage when milked.  Treat all opened and scabbed areas with bactroban, compression with ace wraps (showed show to apply distally -> proximally when placed in office), elevate, heat.  Area of active infection marked with skinscript marker.  Of note, pt is delightful pt - she is kind, personable, and is always quite cooperative in the office. Unfortunately, over time she has shown herself to be VERY non-compliant in follow-through and does have a h/o alcohol addiction - in the hosp she made statements which were concerning for opioid addiction/abuse.  Orders Placed This Encounter  Procedures  . Wound culture    Order Specific Question:  Source    Answer:  medial aspect right foot, heel area    Meds ordered this encounter  Medications  . cefTRIAXone (ROCEPHIN) injection 1 g    Sig:      Order Specific Question:  Antibiotic Indication:    Answer:  Cellulitis  . mupirocin ointment (BACTROBAN) 2 %    Sig: Apply 1 application topically 3 (three) times daily.    Dispense:  30 g    Refill:  1  . fluconazole (DIFLUCAN) 150 MG tablet    Sig: Take 1 tablet (150 mg total) by mouth daily. Please take after completing Keflex. Can repeat after 3 days if needed    Dispense:  2 tablet    Refill:  1  . cephALEXin (KEFLEX) 500 MG capsule    Sig: Take 1 capsule (500 mg total) by mouth 4 (four) times daily.    Dispense:  40 capsule    Refill:  0  . sulfamethoxazole-trimethoprim (BACTRIM DS,SEPTRA DS) 800-160 MG tablet    Sig: Take 1 tablet by mouth 2 (two) times daily.    Dispense:  20 tablet    Refill:  0  . tretinoin (RETIN-A) 0.025 % cream    Sig: Apply topically at bedtime.    Dispense:  45 g    Refill:  1    I personally performed the services described in this documentation, which was scribed in my presence. The recorded information has been reviewed and considered, and addended by me as needed.  Norberto Sorenson, MD MPH

## 2016-01-06 ENCOUNTER — Telehealth: Payer: Self-pay

## 2016-01-06 NOTE — Telephone Encounter (Signed)
Pt states that she was seen recently but declined the pain meds but now she feels she needs it   Best number [phone]

## 2016-01-06 NOTE — Telephone Encounter (Signed)
Pt needed to be seen in 48 hours anyway for recheck - which would be 3/16 - so she can address that with the provider she sees then.      Not to be repeated to pt but info for whoever pt follows up with: (I did not offer pt pain meds and there are concerns about narcotic overuse/addiction issues documented in hosp discharge notes, pt stating she would turn to heroin if she was not given meds for pain control, pt is also an alcoholic, so highly recommend checking controlled substance database prior to rxing).

## 2016-01-08 ENCOUNTER — Observation Stay (HOSPITAL_COMMUNITY)
Admission: EM | Admit: 2016-01-08 | Discharge: 2016-01-10 | Disposition: A | Discharge status: Home / Self Care | Payer: BC Managed Care – PPO | Attending: Family Medicine | Admitting: Family Medicine

## 2016-01-08 ENCOUNTER — Encounter (HOSPITAL_COMMUNITY): Payer: Self-pay | Admitting: Emergency Medicine

## 2016-01-08 ENCOUNTER — Ambulatory Visit (INDEPENDENT_AMBULATORY_CARE_PROVIDER_SITE_OTHER): Payer: BC Managed Care – PPO | Admitting: Physician Assistant

## 2016-01-08 VITALS — BP 128/82 | HR 101 | Temp 99.9°F | Resp 18 | Ht 66.0 in | Wt 282.0 lb

## 2016-01-08 DIAGNOSIS — Z765 Malingerer [conscious simulation]: Secondary | ICD-10-CM | POA: Insufficient documentation

## 2016-01-08 DIAGNOSIS — F329 Major depressive disorder, single episode, unspecified: Secondary | ICD-10-CM | POA: Diagnosis not present

## 2016-01-08 DIAGNOSIS — N179 Acute kidney failure, unspecified: Secondary | ICD-10-CM | POA: Diagnosis not present

## 2016-01-08 DIAGNOSIS — Z6841 Body Mass Index (BMI) 40.0 and over, adult: Secondary | ICD-10-CM | POA: Insufficient documentation

## 2016-01-08 DIAGNOSIS — D509 Iron deficiency anemia, unspecified: Secondary | ICD-10-CM | POA: Diagnosis not present

## 2016-01-08 DIAGNOSIS — F1010 Alcohol abuse, uncomplicated: Secondary | ICD-10-CM | POA: Insufficient documentation

## 2016-01-08 DIAGNOSIS — I872 Venous insufficiency (chronic) (peripheral): Secondary | ICD-10-CM | POA: Insufficient documentation

## 2016-01-08 DIAGNOSIS — L4050 Arthropathic psoriasis, unspecified: Secondary | ICD-10-CM | POA: Insufficient documentation

## 2016-01-08 DIAGNOSIS — F1910 Other psychoactive substance abuse, uncomplicated: Secondary | ICD-10-CM | POA: Diagnosis not present

## 2016-01-08 DIAGNOSIS — G4733 Obstructive sleep apnea (adult) (pediatric): Secondary | ICD-10-CM | POA: Diagnosis not present

## 2016-01-08 DIAGNOSIS — L0390 Cellulitis, unspecified: Secondary | ICD-10-CM | POA: Diagnosis present

## 2016-01-08 DIAGNOSIS — I878 Other specified disorders of veins: Secondary | ICD-10-CM | POA: Insufficient documentation

## 2016-01-08 DIAGNOSIS — I8311 Varicose veins of right lower extremity with inflammation: Secondary | ICD-10-CM

## 2016-01-08 DIAGNOSIS — L03116 Cellulitis of left lower limb: Principal | ICD-10-CM | POA: Diagnosis present

## 2016-01-08 DIAGNOSIS — E669 Obesity, unspecified: Secondary | ICD-10-CM | POA: Diagnosis not present

## 2016-01-08 DIAGNOSIS — Z9884 Bariatric surgery status: Secondary | ICD-10-CM | POA: Diagnosis not present

## 2016-01-08 DIAGNOSIS — L03115 Cellulitis of right lower limb: Secondary | ICD-10-CM | POA: Diagnosis not present

## 2016-01-08 DIAGNOSIS — I83028 Varicose veins of left lower extremity with ulcer other part of lower leg: Secondary | ICD-10-CM | POA: Insufficient documentation

## 2016-01-08 DIAGNOSIS — Z87891 Personal history of nicotine dependence: Secondary | ICD-10-CM | POA: Insufficient documentation

## 2016-01-08 DIAGNOSIS — M1990 Unspecified osteoarthritis, unspecified site: Secondary | ICD-10-CM | POA: Insufficient documentation

## 2016-01-08 HISTORY — DX: Methicillin resistant Staphylococcus aureus infection, unspecified site: A49.02

## 2016-01-08 LAB — POCT CBC
GRANULOCYTE PERCENT: 71.3 % (ref 37–80)
HEMATOCRIT: 28.6 % — AB (ref 37.7–47.9)
HEMOGLOBIN: 9.6 g/dL — AB (ref 12.2–16.2)
Lymph, poc: 2.2 (ref 0.6–3.4)
MCH: 29.8 pg (ref 27–31.2)
MCHC: 33.6 g/dL (ref 31.8–35.4)
MCV: 88.6 fL (ref 80–97)
MID (CBC): 0.9 (ref 0–0.9)
MPV: 6.8 fL (ref 0–99.8)
POC GRANULOCYTE: 7.7 — AB (ref 2–6.9)
POC LYMPH PERCENT: 20.1 %L (ref 10–50)
POC MID %: 8.6 % (ref 0–12)
Platelet Count, POC: 491 10*3/uL — AB (ref 142–424)
RBC: 3.23 M/uL — AB (ref 4.04–5.48)
RDW, POC: 16.2 %
WBC: 10.8 10*3/uL — AB (ref 4.6–10.2)

## 2016-01-08 LAB — WOUND CULTURE
Gram Stain: NONE SEEN
Gram Stain: NONE SEEN
Organism ID, Bacteria: NO GROWTH

## 2016-01-08 NOTE — ED Provider Notes (Signed)
CSN: [id number]     Arrival date & time 01/08/16  2116 History  By signing my name below, I, [name], attest that this documentation has been prepared under the direction and in the presence of Zadie Rhineonald Abbye Lao, MD. Electronically Signed: [name], ED Scribe. 01/08/2016. 12:19 AM.    Chief Complaint  Patient presents with  . Recurrent Skin Infections   The history is provided by the patient. No language interpreter was used.   Patient gave verbal permission to utilize photo for medical documentation only The image was not stored on any personal device   HPI Comments: [name] is a 60 y.o. female with h/o MRSA referred from UC who presents to the Emergency Department complaining of moderate, burning bilateral leg pain onset last month and worsened this evening with associated multiple sores to BLE, BLE swelling, fever (Tmax 101). She also notes erythema to the legs onset this afternoon. Pt was recently hospitalized from 12/03/15-12/11/15 for possible cellulitis or MRSA of BLE. She states she was discharged with Bactrim and Keflex and these have not provided any relief of symptoms. She states her left leg looks worse since admission, but her right leg is mostly unchanged. Pt is still taking the antibiotics as prescribed. She last took Hydrocodone for pain this morning with little to no relief. No h/o of similar symptoms. Pt last saw her PCP today. She denies emesis, any other new pains, CP, abdominal pain.   Past Medical History  Diagnosis Date  . Depression   . Arthritis   . Seasonal allergies   . Anemia   . Obese   . Sleep apnea 2012    took test in [place]-told to use cpap-could not ude  . MRSA (methicillin resistant Staphylococcus aureus)    Past Surgical History  Procedure Laterality Date  . Total knee revision  07/30/2010    right; with lateral release  . Anterior cervical decomp/discectomy fusion  05/09/2008    C5-6, C6-7; with arthrodesis also  . Rotator cuff repair w/  distal clavicle excision  01/06/2006    right  . 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 Total knee arthroplasty  11/07/2003    right  . Shoulder arthroscopy  2003    rt  . Knee arthroscopy  2004    rt and lt knee  . Shoulder arthroscopy  2003    rt  . Tonsillectomy    . Gastric bypass  2002   Family History  Problem Relation Age of Onset  . Heart disease Mother   . Cancer Father    Social History  Substance Use Topics  . Smoking status: Former Smoker    Quit date: 05/16/1981  . Smokeless tobacco: None  . Alcohol Use: 3.0 oz/week    5 Cans of beer per week   OB History    Gravida Para Term Preterm AB TAB SAB Ectopic Multiple Living   0 0 0 0 0 0 0 0 0 0      Review of Systems  Constitutional: Positive for fever.  Cardiovascular: Positive for leg swelling. Negative for chest pain.  Gastrointestinal: Negative for abdominal pain.  Musculoskeletal: Positive for myalgias.  Skin: Positive for color change and wound.  All other systems reviewed and are negative.  Allergies  Review of patient's allergies indicates no known allergies.  Home Medications   Prior to Admission medications   Medication Sig Start Date End Date Taking? Authorizing Provider  amphetamine-dextroamphetamine (ADDERALL XR) 30 MG 24  hr capsule Take 30 mg by mouth 2 (two) times daily. Reported on 12/03/2015 03/06/15   Historical Provider, MD  cephALEXin (KEFLEX) 500 MG capsule Take 1 capsule (500 mg total) by mouth 4 (four) times daily. 01/05/16   Sherren Mocha, MD  DULoxetine (CYMBALTA) 30 MG capsule Take 2 capsules by mouth daily. 02/12/15   Historical Provider, MD  EPINEPHrine 0.3 mg/0.3 mL IJ SOAJ injection Inject 0.3 mLs (0.3 mg total) into the muscle once. 05/07/15   Lorre Nick, MD  fluconazole (DIFLUCAN) 150 MG tablet Take 1 tablet (150 mg total) by mouth daily. Please take after completing Keflex. Can repeat after 3 days if needed 01/05/16   Sherren Mocha, MD  FLUoxetine (PROZAC) 20 MG capsule Take 20 mg  by mouth 2 (two) times daily.     Historical Provider, MD  HYDROcodone-acetaminophen (NORCO) 5-325 MG tablet Take 1 tablet by mouth every 6 (six) hours as needed. 12/13/15   Palma Holter, MD  mupirocin ointment (BACTROBAN) 2 % Apply 1 application topically 3 (three) times daily. 01/05/16   Sherren Mocha, MD  sulfamethoxazole-trimethoprim (BACTRIM DS,SEPTRA DS) 800-160 MG tablet Take 1 tablet by mouth 2 (two) times daily. 01/05/16   Sherren Mocha, MD  traZODone (DESYREL) 50 MG tablet TAKE 50 MG BY MOUTH DAILY AT BEDTIME AS NEEDED FOR SLEEP 12/11/14   Historical Provider, MD  tretinoin (RETIN-A) 0.025 % cream Apply topically at bedtime. 01/05/16   Sherren Mocha, MD   BP 139/83 mmHg  Pulse 99  Temp(Src) 99.1 F (37.3 C) (Oral)  Resp 20  Ht  (1.702 m)  Wt 284 lb 8 oz (129.048 kg)  BMI 44.55 kg/m2  SpO2 97% Physical Exam CONSTITUTIONAL: Well developed/well nourished HEAD: Normocephalic/atraumatic EYES: EOMI/PERRL ENMT: Mucous membranes moist NECK: supple no meningeal signs SPINE/BACK:entire spine nontender CV: S1/S2 noted, no murmurs/rubs/gallops noted LUNGS: Lungs are clear to auscultation bilaterally, no apparent distress ABDOMEN: soft, nontender, no rebound or guarding, bowel sounds noted throughout abdomen GU:no cva tenderness NEURO: Pt is awake/alert/appropriate, moves all extremitiesx4.  No facial droop.   EXTREMITIES: pulses normal/equal, full ROM, see photo No crepitus noted to lower extremities SKIN: warm, color normal PSYCH: no abnormalities of mood noted, alert and oriented to situation       ED Course  Procedures  DIAGNOSTIC STUDIES: Oxygen Saturation is 97% on RA, normal by my interpretation.    COORDINATION OF CARE: 12:17 AM Discussed treatment plan with pt at bedside which includes lab work, IV antibiotics, admission and pt agreed to plan.   Labs Review Labs Reviewed  CBC - Abnormal; Notable for the following:    WBC 11.0 (*)    RBC 3.25 (*)    Hemoglobin 9.1  (*)    HCT 29.7 (*)    RDW 15.7 (*)    Platelets 519 (*)    All other components within normal limits  COMPREHENSIVE METABOLIC PANEL - Abnormal; Notable for the following:    Chloride 98 (*)    Glucose, Bld 102 (*)    Creatinine, Ser 1.13 (*)    Albumin 3.4 (*)    ALT 10 (*)    GFR calc non Af Amer 53 (*)    Anion gap 16 (*)    All other components within normal limits  BRAIN NATRIURETIC PEPTIDE - Abnormal; Notable for the following:    B Natriuretic Peptide 118.2 (*)    All other components within normal limits  LACTIC ACID, PLASMA - Abnormal; Notable for  the following:    Lactic Acid, Venous 0.3 (*)    All other components within normal limits  LACTIC ACID, PLASMA  I-STAT CHEM 8, ED  I-STAT TROPOININ, ED   I have personally reviewed and evaluated these lab results as part of my medical decision-making.  Patient with recurrent cellulitis that is failing outpatient management Will need IV antibiotics Pt admitted to family medicine service  MDM   Final diagnoses:  Cellulitis of left lower extremity    Nursing notes including past medical history and social history reviewed and considered in documentation Labs/vital reviewed myself and considered during evaluation Previous records reviewed and considered    I personally performed the services described in this documentation, which was scribed in my presence. The recorded information has been reviewed and is accurate.       Zadie Rhine, MD 01/09/16 [phone]

## 2016-01-08 NOTE — Telephone Encounter (Signed)
Pt advised.

## 2016-01-08 NOTE — Progress Notes (Signed)
[facility] [address] [place] [address] [phone]  Date:  01/08/2016   Name:  [name]   DOB:  1956-09-16   MRN:  [id number]  PCP:  Norberto Sorenson, MD    Chief Complaint: Follow-up   History of Present Illness:  This is a 60 y.o. female with PMH cellulitis, chronic venous insufficiency, alcohol abuse, depression, chronic anemia who is presenting for follow up cellulitis.  Patient was hospitalized from 2/10 - 2/19. She had had bilateral lower extremity cellulitis. She was seen here by one of my colleagues and started on doxycycline and Bactrim, which she did not respond to so we sent her to the ER. She had multiple abscesses identified and drained, and continued to develop more. Patient was placed in [facility] Medicine service with wound care and general surgery consult. Wound care recommended outpatient dermatology follow-up. She was placed on Vancomycin and Zosyn and still remained febrile, so ID was consulted. Patient was transitioned Ancef into clindamycin and into Keflex per ID recs. She was finally improved on PO Keflex and was sent home with 13 additional days for 14 day course. Last dose 12/26/15. She was noted to be iron-deficient anemic, started on daily iron supplements. She told the hospitalist she was taking hydrocodone 10 mg twice a day. She reportedly told the physician taking care of her that if she did not have her Norco, she would be forced to turn to illicit drugs such as heroin for pain control. She was advised to Follow up with Dr. Clelia Croft 1 week after being discharged from the hospital but did not return until 3 weeks after. In hospital -- A1C 5.4, lipid panel normal, CMP essentially normal.  Was seen by Dr. Clelia Croft on 01/05/16 with recurring cellulitis, worse in the left leg. She had a blister on her right ankle. Blister was unroofed, expressing purulence. Wound culture obtained, with no growth after 2 days. She was placed on keflex and bactrim. Pt is here  today with worsening symptoms. More pain in her legs. Now with fever of 99.9 and pt states she does not feel well.  She is wondering about a prescription for Norco. States Dr. Clelia Croft did not give to her last time and states she has been in a lot of pain. Per [place] controlled datebase, Pt received #120 norco 7 days ago from her rheumatologist. On review, she has received norco from several different providers over the past few months. When confronted about just getting norco she states "well I didn't want to have to use up that rx but that's fine if I have to". Pt states since her hospital stay she has cut back on her alcohol use to 3 beers per night. Prior, she was drinking 6-9 per night. She knows she should quit completely, esp since causes significant problems in her marriage.  Review of Systems:  Review of Systems See HPI  Patient Active Problem List   Diagnosis Date Noted  . Drug-seeking behavior 01/08/2016  . Psoriatic arthritis (HCC)   . Cough   . Wheezing   . Cellulitis of both lower extremities 12/05/2015  . Depression 12/05/2015  . Chronic anemia 12/05/2015  . Chronic venous insufficiency 12/04/2015  . Alcohol abuse 11/21/2014  . Anxiety state 11/21/2014  . BMI 40.0-44.9, adult (HCC) 11/21/2014  . Acute blood loss anemia 09/25/2014  . Epistaxis 09/24/2014    Prior to Admission medications   Medication Sig Start Date End Date Taking? Authorizing Provider  amphetamine-dextroamphetamine (ADDERALL  XR) 30 MG 24 hr capsule Take 30 mg by mouth 2 (two) times daily. Reported on 12/03/2015 03/06/15  Yes Historical Provider, MD  cephALEXin (KEFLEX) 500 MG capsule Take 1 capsule (500 mg total) by mouth 4 (four) times daily. 01/05/16  Yes Sherren MochaEva N Shaw, MD  DULoxetine (CYMBALTA) 30 MG capsule Take 2 capsules by mouth daily. 02/12/15  Yes Historical Provider, MD  EPINEPHrine 0.3 mg/0.3 mL IJ SOAJ injection Inject 0.3 mLs (0.3 mg total) into the muscle once. 05/07/15  Yes Lorre NickAnthony Allen, MD  fluconazole  (DIFLUCAN) 150 MG tablet Take 1 tablet (150 mg total) by mouth daily. Please take after completing Keflex. Can repeat after 3 days if needed 01/05/16  Yes Sherren MochaEva N Shaw, MD  FLUoxetine (PROZAC) 20 MG capsule Take 20 mg by mouth 2 (two) times daily.    Yes Historical Provider, MD  HYDROcodone-acetaminophen (NORCO) 5-325 MG tablet Take 1 tablet by mouth every 6 (six) hours as needed. 12/13/15  Yes Palma HolterKanishka G Gunadasa, MD  mupirocin ointment (BACTROBAN) 2 % Apply 1 application topically 3 (three) times daily. 01/05/16  Yes Sherren MochaEva N Shaw, MD  sulfamethoxazole-trimethoprim (BACTRIM DS,SEPTRA DS) 800-160 MG tablet Take 1 tablet by mouth 2 (two) times daily. 01/05/16  Yes Sherren MochaEva N Shaw, MD  traZODone (DESYREL) 50 MG tablet TAKE 50 MG BY MOUTH DAILY AT BEDTIME AS NEEDED FOR SLEEP 12/11/14  Yes Historical Provider, MD  tretinoin (RETIN-A) 0.025 % cream Apply topically at bedtime. 01/05/16  Yes Sherren MochaEva N Shaw, MD    No Known Allergies  Past Surgical History  Procedure Laterality Date  . Total knee revision  07/30/2010    right; with lateral release  . Anterior cervical decomp/discectomy fusion  05/09/2008    C5-6, C6-7; with arthrodesis also  . Rotator cuff repair w/ distal clavicle excision  01/06/2006    right  . 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 Total knee arthroplasty  11/07/2003    right  . Shoulder arthroscopy  2003    rt  . Knee arthroscopy  2004    rt and lt knee  . Shoulder arthroscopy  2003    rt  . Tonsillectomy    . Gastric bypass  2002    Social History  Substance Use Topics  . Smoking status: Former Smoker    Quit date: 05/16/1981  . Smokeless tobacco: None  . Alcohol Use: 3.0 oz/week    5 Cans of beer per week    Family History  Problem Relation Age of Onset  . Heart disease Mother   . Cancer Father     Medication list has been reviewed and updated.  Physical Examination:  Physical Exam  Constitutional: She is oriented to person, place, and time. She appears  well-developed and well-nourished. No distress.  HENT:  Head: Normocephalic and atraumatic.  Right Ear: Hearing normal.  Left Ear: Hearing normal.  Nose: Nose normal.  Eyes: Conjunctivae and lids are normal. Right eye exhibits no discharge. Left eye exhibits no discharge. No scleral icterus.  Pulmonary/Chest: Effort normal. No respiratory distress.  Musculoskeletal: Normal range of motion.  Neurological: She is alert and oriented to person, place, and time.  Skin: Skin is warm and dry.  Unwrapped ace wrap from bilateral legs.  Right leg with brown discoloration and firm skin, consistent with venous stasis dermatitis. 1 open ulcer. Several scabbed lesions. No tenderness.  Left leg with spreading erythema. 2 large blisters anterior shin. Significant tenderness. Larger blister unroofed and only  serous material expressed. Smaller blister unroofed and purulence expressed.  Wounds dressed and wrapped in coband  Psychiatric: She has a normal mood and affect. Her speech is normal and behavior is normal. Thought content normal.    BP 128/82 mmHg  Pulse 101  Temp(Src) 99.9 F (37.7 C) (Oral)  Resp 18  Ht  (1.676 m)  Wt 282 lb (127.914 kg)  BMI 45.54 kg/m2  SpO2 97%  Results for orders placed or performed in visit on 01/08/16  POCT CBC  Result Value Ref Range   WBC 10.8 (A) 4.6 - 10.2 K/uL   Lymph, poc 2.2 0.6 - 3.4   POC LYMPH PERCENT 20.1 10 - 50 %L   MID (cbc) 0.9 0 - 0.9   POC MID % 8.6 0 - 12 %M   POC Granulocyte 7.7 (A) 2 - 6.9   Granulocyte percent 71.3 37 - 80 %G   RBC 3.23 (A) 4.04 - 5.48 M/uL   Hemoglobin 9.6 (A) 12.2 - 16.2 g/dL   HCT, POC 16.1 (A) 09.6 - 47.9 %   MCV 88.6 80 - 97 fL   MCH, POC 29.8 27 - 31.2 pg   MCHC 33.6 31.8 - 35.4 g/dL   RDW, POC 04.5 %   Platelet Count, POC 491 (A) 142 - 424 K/uL   MPV 6.8 0 - 99.8 fL   Assessment and Plan:  1. Cellulitis of left lower extremity Worsening cellulitis on po keflex and bactrim. Pt now with low grade temp  to 99.9. Wound culture no growth. CBC with mild leukocytosis to 10.8. Pt is yet again failing outpatient therapy for cellulitis -- she will go to [facility] for evaluation. - POCT CBC  2. Venous stasis dermatitis of right lower extremity Right leg consistent with venous stasis dermatitis rather than cellulitis.  3. Drug-seeking behavior Pt asked for norco tonight. [place] controlled substance database revealed she got #120 seven days ago from her rheumatologist. Per review of database, she has gotten norco prescriptions from several different providers and indicated during her hospital stay that if she did not get norco rx, she would be forced to turn to illicit drugs, like heroin. Caution providing her any norco.  4. Alcohol abuse Pt states she has cut down on her alcohol use since her hospital stay, but still drinking, even though her use causes significant problems in her marriage.   Roswell Miners Dyke Brackett, MHS [facility]  01/08/2016

## 2016-01-08 NOTE — ED Notes (Signed)
PT states she was diagnosed with MRSA to bilateral lower legs 2 weeks ago.  States she was taking antibiotics in hospital and was sent home with antibiotics.  Pt reports no improvement after 2 weeks of antibiotics.

## 2016-01-08 NOTE — Patient Instructions (Addendum)
Go to [facility] first thing in the AM.  Keep taking antibiotics.    IF you received an x-ray today, you will receive an invoice from [facility]. Please contact [facility] at [phone] with questions or concerns regarding your invoice.   IF you received labwork today, you will receive an invoice from [facility]. Please contact [facility] at [phone] with questions or concerns regarding your invoice.   Our billing staff will not be able to assist you with questions regarding bills from these companies.  You will be contacted with the lab results as soon as they are available. The fastest way to get your results is to activate your My Chart account. Instructions are located on the last page of this paperwork. If you have not heard from us regarding the results in 2 weeks, please contact this office.

## 2016-01-09 DIAGNOSIS — I872 Venous insufficiency (chronic) (peripheral): Secondary | ICD-10-CM | POA: Diagnosis not present

## 2016-01-09 DIAGNOSIS — L03116 Cellulitis of left lower limb: Secondary | ICD-10-CM | POA: Diagnosis present

## 2016-01-09 DIAGNOSIS — F1010 Alcohol abuse, uncomplicated: Secondary | ICD-10-CM

## 2016-01-09 DIAGNOSIS — F329 Major depressive disorder, single episode, unspecified: Secondary | ICD-10-CM | POA: Diagnosis not present

## 2016-01-09 DIAGNOSIS — L0390 Cellulitis, unspecified: Secondary | ICD-10-CM | POA: Diagnosis present

## 2016-01-09 DIAGNOSIS — L03119 Cellulitis of unspecified part of limb: Secondary | ICD-10-CM

## 2016-01-09 LAB — CBC
HEMATOCRIT: 29.7 % — AB (ref 36.0–46.0)
Hemoglobin: 9.1 g/dL — ABNORMAL LOW (ref 12.0–15.0)
MCH: 28 pg (ref 26.0–34.0)
MCHC: 30.6 g/dL (ref 30.0–36.0)
MCV: 91.4 fL (ref 78.0–100.0)
PLATELETS: 519 10*3/uL — AB (ref 150–400)
RBC: 3.25 MIL/uL — ABNORMAL LOW (ref 3.87–5.11)
RDW: 15.7 % — AB (ref 11.5–15.5)
WBC: 11 10*3/uL — AB (ref 4.0–10.5)

## 2016-01-09 LAB — COMPREHENSIVE METABOLIC PANEL
ALBUMIN: 3.4 g/dL — AB (ref 3.5–5.0)
ALT: 10 U/L — ABNORMAL LOW (ref 14–54)
ANION GAP: 16 — AB (ref 5–15)
AST: 21 U/L (ref 15–41)
Alkaline Phosphatase: 97 U/L (ref 38–126)
BILIRUBIN TOTAL: 0.4 mg/dL (ref 0.3–1.2)
BUN: 12 mg/dL (ref 6–20)
CHLORIDE: 98 mmol/L — AB (ref 101–111)
CO2: 23 mmol/L (ref 22–32)
Calcium: 9.3 mg/dL (ref 8.9–10.3)
Creatinine, Ser: 1.13 mg/dL — ABNORMAL HIGH (ref 0.44–1.00)
GFR calc Af Amer: >60 mL/min (ref >60)
GFR, EST NON AFRICAN AMERICAN: 53 mL/min — AB (ref >60)
GLUCOSE: 102 mg/dL — AB (ref 65–99)
POTASSIUM: 4.3 mmol/L (ref 3.5–5.1)
Sodium: 137 mmol/L (ref 135–145)
TOTAL PROTEIN: 7 g/dL (ref 6.5–8.1)

## 2016-01-09 LAB — I-STAT TROPONIN, ED: TROPONIN I, POC: 0 ng/mL (ref 0.00–0.08)

## 2016-01-09 LAB — LACTIC ACID, PLASMA
LACTIC ACID, VENOUS: 0.5 mmol/L (ref 0.5–2.0)
Lactic Acid, Venous: 0.3 mmol/L — ABNORMAL LOW (ref 0.5–2.0)

## 2016-01-09 LAB — BRAIN NATRIURETIC PEPTIDE: B NATRIURETIC PEPTIDE 5: 118.2 pg/mL — AB (ref 0.0–100.0)

## 2016-01-09 MED ORDER — OXYCODONE-ACETAMINOPHEN 5-325 MG PO TABS
1.0000 | ORAL_TABLET | Freq: Once | ORAL | Status: AC
  Administered 2016-01-09: 1 via ORAL
  Filled 2016-01-09: qty 1

## 2016-01-09 MED ORDER — POLYETHYLENE GLYCOL 3350 17 G PO PACK: 17.0000 g | PACK | Freq: Every day | ORAL | Status: DC | PRN

## 2016-01-09 MED ORDER — CLINDAMYCIN HCL 300 MG PO CAPS
300.0000 mg | ORAL_CAPSULE | Freq: Four times a day (QID) | ORAL | Status: DC
  Administered 2016-01-09 – 2016-01-10 (×6): 300 mg via ORAL
  Filled 2016-01-09 (×6): qty 1

## 2016-01-09 MED ORDER — DULOXETINE HCL 60 MG PO CPEP
60.0000 mg | ORAL_CAPSULE | Freq: Every day | ORAL | Status: DC
  Administered 2016-01-09 – 2016-01-10 (×2): 60 mg via ORAL
  Filled 2016-01-09 (×2): qty 1

## 2016-01-09 MED ORDER — THIAMINE HCL 100 MG/ML IJ SOLN: 100.0000 mg | Freq: Every day | INTRAMUSCULAR | Status: DC

## 2016-01-09 MED ORDER — SODIUM CHLORIDE 0.9 % IV SOLN: 250.0000 mL | INTRAVENOUS | Status: DC | PRN

## 2016-01-09 MED ORDER — ADULT MULTIVITAMIN W/MINERALS CH
1.0000 | ORAL_TABLET | Freq: Every day | ORAL | Status: DC
  Administered 2016-01-09 – 2016-01-10 (×2): 1 via ORAL
  Filled 2016-01-09 (×2): qty 1

## 2016-01-09 MED ORDER — VITAMIN B-1 100 MG PO TABS
100.0000 mg | ORAL_TABLET | Freq: Every day | ORAL | Status: DC
  Administered 2016-01-09 – 2016-01-10 (×2): 100 mg via ORAL
  Filled 2016-01-09 (×2): qty 1

## 2016-01-09 MED ORDER — LORAZEPAM 1 MG PO TABS
1.0000 mg | ORAL_TABLET | Freq: Four times a day (QID) | ORAL | Status: DC | PRN
  Administered 2016-01-09: 1 mg via ORAL
  Filled 2016-01-09: qty 1

## 2016-01-09 MED ORDER — ENOXAPARIN SODIUM 40 MG/0.4ML ~~LOC~~ SOLN
40.0000 mg | Freq: Every day | SUBCUTANEOUS | Status: DC
  Administered 2016-01-09 – 2016-01-10 (×2): 40 mg via SUBCUTANEOUS
  Filled 2016-01-09 (×2): qty 0.4

## 2016-01-09 MED ORDER — CLINDAMYCIN PHOSPHATE 600 MG/50ML IV SOLN
600.0000 mg | Freq: Once | INTRAVENOUS | Status: AC
  Administered 2016-01-09: 600 mg via INTRAVENOUS
  Filled 2016-01-09: qty 50

## 2016-01-09 MED ORDER — TRAZODONE HCL 50 MG PO TABS
50.0000 mg | ORAL_TABLET | Freq: Every day | ORAL | Status: DC
  Administered 2016-01-09: 50 mg via ORAL
  Filled 2016-01-09: qty 1

## 2016-01-09 MED ORDER — ACETAMINOPHEN 650 MG RE SUPP: 650.0000 mg | Freq: Four times a day (QID) | RECTAL | Status: DC | PRN

## 2016-01-09 MED ORDER — FOLIC ACID 1 MG PO TABS
1.0000 mg | ORAL_TABLET | Freq: Every day | ORAL | Status: DC
Start: 2016-01-09 — End: 2016-01-10
  Administered 2016-01-09 – 2016-01-10 (×2): 1 mg via ORAL
  Filled 2016-01-09 (×3): qty 1

## 2016-01-09 MED ORDER — ACETAMINOPHEN 325 MG PO TABS
650.0000 mg | ORAL_TABLET | Freq: Four times a day (QID) | ORAL | Status: DC | PRN
  Administered 2016-01-09: 650 mg via ORAL
  Filled 2016-01-09: qty 2

## 2016-01-09 MED ORDER — HYDROCODONE-ACETAMINOPHEN 5-325 MG PO TABS
1.0000 | ORAL_TABLET | Freq: Four times a day (QID) | ORAL | Status: DC | PRN
  Administered 2016-01-09 – 2016-01-10 (×4): 1 via ORAL
  Filled 2016-01-09 (×4): qty 1

## 2016-01-09 MED ORDER — FLUOXETINE HCL 20 MG PO CAPS
40.0000 mg | ORAL_CAPSULE | Freq: Two times a day (BID) | ORAL | Status: DC
  Administered 2016-01-09 – 2016-01-10 (×3): 40 mg via ORAL
  Filled 2016-01-09 (×3): qty 2

## 2016-01-09 MED ORDER — SODIUM CHLORIDE 0.9% FLUSH
3.0000 mL | Freq: Two times a day (BID) | INTRAVENOUS | Status: DC
  Administered 2016-01-09: 3 mL via INTRAVENOUS

## 2016-01-09 MED ORDER — SODIUM CHLORIDE 0.9% FLUSH: 3.0000 mL | INTRAVENOUS | Status: DC | PRN

## 2016-01-09 MED ORDER — SODIUM CHLORIDE 0.9 % IV SOLN
INTRAVENOUS | Status: DC
  Administered 2016-01-09: 15:00:00 via INTRAVENOUS

## 2016-01-09 MED ORDER — HYDROXYZINE HCL 25 MG PO TABS
50.0000 mg | ORAL_TABLET | Freq: Once | ORAL | Status: DC
  Filled 2016-01-09: qty 2

## 2016-01-09 MED ORDER — LORAZEPAM 2 MG/ML IJ SOLN
1.0000 mg | Freq: Four times a day (QID) | INTRAMUSCULAR | Status: DC | PRN
  Administered 2016-01-09: 1 mg via INTRAVENOUS
  Filled 2016-01-09: qty 1

## 2016-01-09 NOTE — ED Notes (Signed)
Spoke to Diplomatic Services operational officersecretary, left call back number, [phone].

## 2016-01-09 NOTE — ED Notes (Signed)
Attempted to call report to [phone], no answer.

## 2016-01-09 NOTE — Evaluation (Signed)
Physical Therapy Evaluation/ Discharge Patient Details Name: [name] T Mcandrew MRN: [id number] DOB: Jan 05, 1956 Today's Date: 01/09/2016   History of Present Illness  60 yo admitted with bil LE cellulitis. PMHx: depression, OSA, OA, gastric bypass, psoriatic arthritis  Clinical Impression  Pt very pleasant and moving well. She is able to perform all transfers, gait, stairs, ADLs on her own without difficulty or change from baseline although she does report pain in bil LE. Pt educated for need for ankle pumps, gait, and bil LE elevation throughout the day to maximize function and circulation. Pt without further therapy needs, aware and able to verbalize education and agreeable to no further needs. Will sign off.     Follow Up Recommendations No PT follow up    Equipment Recommendations  None recommended by PT    Recommendations for Other Services       Precautions / Restrictions Precautions Precautions: None      Mobility  Bed Mobility Overal bed mobility: Modified Independent                Transfers Overall transfer level: Modified independent                  Ambulation/Gait Ambulation/Gait assistance: Independent Ambulation Distance (Feet): 300 Feet Assistive device: None Gait Pattern/deviations: WFL(Within Functional Limits)   Gait velocity interpretation: at or above normal speed for age/gender    Stairs Stairs: Yes Stairs assistance: Modified independent (Device/Increase time) Stair Management: One rail Left;Step to pattern;Forwards Number of Stairs: 3 General stair comments: cues for sequence initially  Wheelchair Mobility    Modified Rankin (Stroke Patients Only)       Balance Overall balance assessment: No apparent balance deficits (not formally assessed)                                           Pertinent Vitals/Pain Pain Assessment: 0-10 Pain Score: 3  Pain Location: sore bil LE Pain Descriptors / Indicators:  Sore Pain Intervention(s): Limited activity within patient's tolerance;Repositioned    Home Living Family/patient expects to be discharged to:: Private residence Living Arrangements: Spouse/significant other Available Help at Discharge: Family;Available 24 hours/day Type of Home: House Home Access: Stairs to enter Entrance Stairs-Rails: Left Entrance Stairs-Number of Steps: 2 Home Layout: One level Home Equipment: Walker - 4 wheels;Cane - single point      Prior Function Level of Independence: Independent               Hand Dominance        Extremity/Trunk Assessment   Upper Extremity Assessment: Overall WFL for tasks assessed           Lower Extremity Assessment: Overall WFL for tasks assessed      Cervical / Trunk Assessment: Normal  Communication   Communication: No difficulties  Cognition Arousal/Alertness: Awake/alert Behavior During Therapy: WFL for tasks assessed/performed Overall Cognitive Status: Within Functional Limits for tasks assessed                      General Comments      Exercises        Assessment/Plan    PT Assessment Patent does not need any further PT services  PT Diagnosis Acute pain   PT Problem List    PT Treatment Interventions     PT Goals (Current goals can be found in the Care Plan  section) Acute Rehab PT Goals PT Goal Formulation: All assessment and education complete, DC therapy    Frequency     Barriers to discharge        Co-evaluation               End of Session   Activity Tolerance: Patient tolerated treatment well Patient left: in chair;with call bell/phone within reach Nurse Communication: Mobility status    Functional Assessment Tool Used: clinical judgement Functional Limitation: Mobility: Walking and moving around Mobility: Walking and Moving Around Goal Status [phone]): 0 percent impaired, limited or restricted Mobility: Walking and Moving Around Discharge Status [phone]): 0  percent impaired, limited or restricted    Time: 1058-1110 PT Time Calculation (min) (ACUTE ONLY): 12 min   Charges:   PT Evaluation $PT Eval Low Complexity: 1 Procedure     PT G Codes:   PT G-Codes **NOT FOR INPATIENT CLASS** Functional Assessment Tool Used: clinical judgement Functional Limitation: Mobility: Walking and moving around Mobility: Walking and Moving Around Goal Status (U9811): 0 percent impaired, limited or restricted Mobility: Walking and Moving Around Discharge Status (B1478): 0 percent impaired, limited or restricted    Delorse Lek 01/09/2016, 11:14 AM Delaney Meigs, PT [phone]

## 2016-01-09 NOTE — Progress Notes (Signed)
FPTS Interim Progress Note  S: Patient vital signs are stable and patient is sleeping comfortably in room. Some increased pain to her lower extremities reported. Otherwise no complaints this time. Patient grateful for care provided  O: BP 124/45 mmHg  Pulse 89  Temp(Src) 98.4 F (36.9 C) (Oral)  Resp 16  Ht 5\' 7"  (1.702 m)  Wt 284 lb 8 oz (129.048 kg)  BMI 44.55 kg/m2  SpO2 97%  General -- pleasant and cooperative. NAD Chest -- good expansion. Lungs clear to auscultation. Cardiac -- RRR. No murmurs noted.  Extremeties - +2 pitting edema to the proximal tibia bilaterally. Dorsalis pedis pulses present and symmetrical.  Integument -- chronic venous stasis skin noted bilaterally. Left lower extremity with two 2 cm diameter ulcerations. No change in surrounding erythema present.   A/P: Bilateral lower extremity cellulitis. L>R - Continue oral antibiotic therapy. - I have added patient's home dose of Norco every 6 hour when necessary medication to help with discomfort. - Consider placing order for Ace bandage wraps to be placed on bilateral lower extremities.  Acute kidney injury: Baseline creatinine 0.75-0.9. Most recent lab shows creatinine of 1.13. Slight AG acidosis as well w/ AG 16 - I have added IV fluids at 125 mL/hour - Monitor with BMP tomorrow morning - Watch AG  Patient may require overnight observation to ensure proper response to antibiotic therapy. Will have day team reassess in the afternoon. At this time anticipated discharge 01/10/16.  Kathee DeltonIan D Tattianna Schnarr, MD 01/09/2016, 9:41 AM PGY-2, [facility] pager [phone]

## 2016-01-09 NOTE — H&P (Signed)
Family Medicine Teaching [facility] Admission History and Physical Service Pager: [phone]  Patient name: [name] Medical record number: [id number] Date of birth: March 30, 1956 Age: 60 y.o. Gender: female  Primary Care Provider: Norberto Sorenson, MD Consultants: none Code Status: full  Chief Complaint: worsening cellulitis, bilateral LE.  Assessment and Plan: [name] is a 60 y.o. female presenting with worsening lower extremity cellulitis (L>R). PMH includes depression, osteoarthritis, anemia, OSA (not on CPAP), obesity s/p gastric bypass surgery in 2002, venous insufficiency, alcohol abuse, and psoriatic arthritis.  # Cellulitis with Abscesses, bilateral L>R:  Patient had been recently discharged one month ago for the same issue. Multiple I&Ds performed at previous admission. No current evidence of new abscesses. Failed prior courses of Augmentin, and Doxycycline; now with new failures with Keflex and Bactrim. Due to patient's positive response to antibiotic therapy during previous admission new medication failure is suspicious for noncompliance. qSOFA score 0. Afebrile. Vitals stable. New leukocytosis to 10.8. - Brought in for observation to [facility] Medicine Teaching Service; attending physician Dr. Deirdre Priest. - Monitor vitals - Area of cellulitis demarcated. Monitor for improvement. - Cycle Lactic Acid - Blood cultures not warranted at this time as patient is afebrile with normal vital signs.  - Low threshold to obtain blood cultures with any new fever. - Very low suspicion for DVT with no calf tenderness, palpable pulses and negative Homans sign - WOC consulted >> consider set up for outpatient wound care? - IV clindamycin provided in ED. Initiate PO clindamycin, as patient is currently afebrile and vital signs are stable and has good oral intake -- also due to the high suspicion for medication noncompliance.  # Venous insufficiency: Extensive varicose veins noted in bilateral  lower extremities. +2 pitting edema to mid/proximal tibia on exam. Acute and chronic skin changes noted. Obvious contributor (and likely cause) to patient's active cellulitis. - Nursing order for lower extremity elevation placed - Consider compression stockings versus Ace wraps versus Una boots - Ambulate patient with nursing and PT. - Monitor  # Anemia: POCT Hemoglobin 9.6 on admission. Previous iron studies suggestive of iron deficiency anemia. Per the discharge summary from previous admission patient had been discharged on ferrous sulfate. Ferrous sulfate is no longer on patient's medication list. - Will obtain CBC in a.m. to assess more accurate hemoglobin and MCV - Will monitor  # History of Alcohol Abuse: Records state patient typically drinks 6 beers daily. Most recent note states that she has cut this down to 3 beers a day. - CIWA  # Depression: Home medications include Cymbalta , Fluoxetine , and Trazodone . - Continue home medications.    FEN/GI: Heart healthy/carb modified diet. Saline lock IV. Prophylaxis: Lovenox  Disposition: Home when medically stable  History of Present Illness:  [name] is a 60 y.o. female presenting with worsening bilateral lower extremity cellulitis. This issue is the same complaint she had presented with one month ago and had been discharged from our service on 12/13/15. Patient was seen by her primary care provider on 01/08/16 for follow-up from her previous admission. At that visit it was noted that she had had some worsening of her cellulitis since discharge. Patient endorsed good compliance with the antibiotics she had been discharged on, Keflex and Bactrim. However after further questioning about how she was taking these medications it was noted that she reported taking Keflex twice a day instead of 4 times a day. This new treatment failure and her previous treatment failures on doxycycline, Augmentin,  and Bactrim (monotherapy) raise  some concern for medication noncompliance.  In the ED patient was afebrile and vital signs were stable. Patient endorsed some itching/discomfort in her lower extremities but was otherwise feeling well. She denied any nausea, vomiting, diarrhea. She denied any shortness of breath or chest pain. No headaches, chills, diaphoresis, or weakness. Patient was brought in under observation for failed outpatient antibiotic therapy.  Review Of Systems: Per HPI Otherwise the remainder of the systems were negative.  Patient Active Problem List   Diagnosis Date Noted  . Cellulitis of left lower extremity 01/09/2016  . Cellulitis 01/09/2016  . Drug-seeking behavior 01/08/2016  . Psoriatic arthritis (HCC)   . Cough   . Wheezing   . Cellulitis of both lower extremities 12/05/2015  . Depression 12/05/2015  . Chronic anemia 12/05/2015  . Chronic venous insufficiency 12/04/2015  . Alcohol abuse 11/21/2014  . Anxiety state 11/21/2014  . BMI 40.0-44.9, adult (HCC) 11/21/2014  . Acute blood loss anemia 09/25/2014  . Epistaxis 09/24/2014    Past Medical History: Past Medical History  Diagnosis Date  . Depression   . Arthritis   . Seasonal allergies   . Anemia   . Obese   . Sleep apnea 2012    took test in [place]-told to use cpap-could not ude  . MRSA (methicillin resistant Staphylococcus aureus)     Past Surgical History: Past Surgical History  Procedure Laterality Date  . Total knee revision  07/30/2010    right; with lateral release  . Anterior cervical decomp/discectomy fusion  05/09/2008    C5-6, C6-7; with arthrodesis also  . Rotator cuff repair w/ distal clavicle excision  01/06/2006    right  . 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 Total knee arthroplasty  11/07/2003    right  . Shoulder arthroscopy  2003    rt  . Knee arthroscopy  2004    rt and lt knee  . Shoulder arthroscopy  2003    rt  . Tonsillectomy    . Gastric bypass  2002    Social History: Social History   Substance Use Topics  . Smoking status: Former Smoker    Quit date: 05/16/1981  . Smokeless tobacco: None  . Alcohol Use: 3.0 oz/week    5 Cans of beer per week   Additional social history: Alcohol abuse and concerns about drug-seeking behavior Please also refer to relevant sections of EMR.  Family History: Family History  Problem Relation Age of Onset  . Heart disease Mother   . Cancer Father     Allergies and Medications: No Known Allergies No current facility-administered medications on file prior to encounter.   Current Outpatient Prescriptions on File Prior to Encounter  Medication Sig Dispense Refill  . amphetamine-dextroamphetamine (ADDERALL XR) 30 MG 24 hr capsule Take 30 mg by mouth 2 (two) times daily. Reported on 12/03/2015  0  . cephALEXin (KEFLEX) 500 MG capsule Take 1 capsule (500 mg total) by mouth 4 (four) times daily. 40 capsule 0  . DULoxetine (CYMBALTA) 30 MG capsule Take 2 capsules by mouth daily.  1  . EPINEPHrine 0.3 mg/0.3 mL IJ SOAJ injection Inject 0.3 mLs (0.3 mg total) into the muscle once. (Patient taking differently: Inject 0.3 mg into the muscle daily as needed (allergic reaction). ) 1 Device 2  . FLUoxetine (PROZAC) 20 MG capsule Take 40 mg by mouth 2 (two) times daily.     Marland Kitchen. HYDROcodone-acetaminophen (NORCO) 5-325 MG tablet Take 1  tablet by mouth every 6 (six) hours as needed. (Patient taking differently: Take 1 tablet by mouth every 6 (six) hours as needed for moderate pain. ) 15 tablet 0  . sulfamethoxazole-trimethoprim (BACTRIM DS,SEPTRA DS) 800-160 MG tablet Take 1 tablet by mouth 2 (two) times daily. 20 tablet 0  . traZODone (DESYREL) 50 MG tablet TAKE 50 MG BY MOUTH DAILY AT BEDTIME AS NEEDED FOR SLEEP    . fluconazole (DIFLUCAN) 150 MG tablet Take 1 tablet (150 mg total) by mouth daily. Please take after completing Keflex. Can repeat after 3 days if needed 2 tablet 1  . mupirocin ointment (BACTROBAN) 2 % Apply 1 application topically 3 (three)  times daily. (Patient not taking: Reported on 01/09/2016) 30 g 1  . tretinoin (RETIN-A) 0.025 % cream Apply topically at bedtime. (Patient not taking: Reported on 01/09/2016) 45 g 1    Objective: BP 123/67 mmHg  Pulse 82  Temp(Src) 98.6 F (37 C) (Oral)  Resp 15  Ht  (1.702 m)  Wt 284 lb 8 oz (129.048 kg)  BMI 44.55 kg/m2  SpO2 94% Exam: General -- oriented x3, pleasant and cooperative. HEENT -- Head is normocephalic. PERRLA. EOMI. MMM Neck -- supple; no bruits. No JVD Chest -- good expansion. Lungs clear to auscultation. Cardiac -- RRR. No murmurs noted.  Abdomen -- obese, soft, nontender. No masses palpable. Bowel sounds present. CNS -- cranial nerves II through XII grossly intact. 2+ reflexes bilaterally. Extremeties - +2 pitting edema to the proximal tibia bilaterally. ROM good. 5/5 bilateral strength. Dorsalis pedis pulses present and symmetrical.  Integument -- chronic venous stasis skin changes noted bilaterally. Left lower extremity: 2 independent 2 cm diameter ulcerations on the anterior medial aspect of the mid tibia. Some purulent/serous fluid draining from lesion. Surrounding erythema present. With borders marked. Right lower extremity with numerous healing scabs/wounds and thickened texture. Large healing scab noted inferior to medial malleolus. .       Labs and Imaging: CBC BMET   Recent Labs Lab 01/08/16 1744  WBC 10.8*  HGB 9.6*  HCT 28.6*   No results for input(s): NA, K, CL, CO2, BUN, CREATININE, GLUCOSE, CALCIUM in the last 168 hours.    Kathee Delton, MD 01/09/2016, 4:12 AM PGY-2, [facility] FPTS Intern pager: [phone], text pages welcome

## 2016-01-09 NOTE — ED Notes (Signed)
Admitting MD at the bedside.  

## 2016-01-10 DIAGNOSIS — L03119 Cellulitis of unspecified part of limb: Secondary | ICD-10-CM | POA: Diagnosis not present

## 2016-01-10 LAB — CBC
HEMATOCRIT: 29.3 % — AB (ref 36.0–46.0)
HEMOGLOBIN: 8.9 g/dL — AB (ref 12.0–15.0)
MCH: 27.6 pg (ref 26.0–34.0)
MCHC: 30.4 g/dL (ref 30.0–36.0)
MCV: 91 fL (ref 78.0–100.0)
Platelets: 450 10*3/uL — ABNORMAL HIGH (ref 150–400)
RBC: 3.22 MIL/uL — ABNORMAL LOW (ref 3.87–5.11)
RDW: 15.4 % (ref 11.5–15.5)
WBC: 6.8 10*3/uL (ref 4.0–10.5)

## 2016-01-10 LAB — BASIC METABOLIC PANEL
Anion gap: 14 (ref 5–15)
BUN: 16 mg/dL (ref 6–20)
CO2: 22 mmol/L (ref 22–32)
CREATININE: 1.06 mg/dL — AB (ref 0.44–1.00)
Calcium: 8.7 mg/dL — ABNORMAL LOW (ref 8.9–10.3)
Chloride: 102 mmol/L (ref 101–111)
GFR, EST NON AFRICAN AMERICAN: 57 mL/min — AB (ref >60)
Glucose, Bld: 87 mg/dL (ref 65–99)
Potassium: 4.2 mmol/L (ref 3.5–5.1)
SODIUM: 138 mmol/L (ref 135–145)

## 2016-01-10 MED ORDER — CLINDAMYCIN HCL 300 MG PO CAPS: 300.0000 mg | ORAL_CAPSULE | Freq: Four times a day (QID) | ORAL | Status: AC

## 2016-01-10 MED ORDER — FLUCONAZOLE 150 MG PO TABS
150.0000 mg | ORAL_TABLET | Freq: Every day | ORAL | Status: DC
Start: 2016-01-10 — End: 2016-02-02

## 2016-01-10 NOTE — Progress Notes (Signed)
Family Medicine Teaching Service Daily Progress Note Intern Pager: [phone]  Patient name: [name] Medical record number: [id number] Date of birth: 1956-03-20 Age: 60 y.o. Gender: female  Primary Care Provider: Norberto SorensonSHAW,EVA, MD Consultants: None  Code Status: Full   Pt Overview and Major Events to Date:  3/18: Patient admitted for cellulitis   Assessment and Plan: [name] is a 60 y.o. female presenting with worsening lower extremity cellulitis (L>R). PMH includes depression, osteoarthritis, anemia, OSA (not on CPAP), obesity s/p gastric bypass surgery in 2002, venous insufficiency, alcohol abuse, and psoriatic arthritis.  # Cellulitis with Abscesses, bilateral L>R: Patient had been recently discharged one month ago for the same issue. Multiple I&Ds performed at previous admission. No current evidence of new abscesses. Failed prior courses of Augmentin, and Doxycycline; now with new failures with Keflex and Bactrim. Due to patient's positive response to antibiotic therapy during previous admission new medication failure is suspicious for noncompliance. Patient has continued to remain afebrile. Leukocytosis that was present at admission has since resolved.  - Area of cellulitis demarcated. Monitor for improvement. - Blood cultures not warranted at this time as patient is afebrile with normal vital signs; Low threshold to obtain blood cultures with any new fever. - WOC consulted >> consider set up for outpatient wound care? -continue Clindamycin PO  -PT had no equipment recommendations   # Venous insufficiency: Extensive varicose veins noted in bilateral lower extremities. Acute and chronic skin changes noted. Obvious contributor (and likely cause) to patient's active cellulitis. - Nursing order for lower extremity elevation placed - Consider compression stockings versus Ace wraps versus Una boots - Ambulate patient with nursing and PT.   # Anemia: POCT Hemoglobin 9.6 on admission.  Previous iron studies suggestive of iron deficiency anemia. Per the discharge summary from previous admission patient had been discharged on ferrous sulfate. Ferrous sulfate is no longer on patient's medication list. - HgB 8/9 (3/19)   # History of Alcohol Abuse: Records state patient typically drinks 6 beers daily. Most recent note states that she has cut this down to 3 beers a day. - CIWA: scores: 0 x3   # Depression: Home medications include Cymbalta 60mg , Fluoxetine 20mg , and Trazodone 50mg . - Continue home medications.    FEN/GI: Heart healthy/carb modified diet. Saline lock IV. Prophylaxis: Lovenox  Disposition: Home pending improvement of cellulitis   Subjective:  Reports keeping feet elevated. Notes pain with ambulation.   Objective: Temp:  [97.6 F (36.4 C)-99.8 F (37.7 C)] 98 F (36.7 C) (03/19 0500) Pulse Rate:  [84-101] 84 (03/19 0500) Resp:  [18-20] 20 (03/19 0500) BP: (121-150)/(52-88) 125/73 mmHg (03/19 0500) SpO2:  [96 %-97 %] 96 % (03/19 0500) Physical Exam: General: Female lying in bed with feet elevated, NAD  Cardiovascular: RRR. No murmurs appreciated.  Respiratory: CTAB. Normal WOB.  Abdomen: obese, soft, NTND  Extremities: Compression marks present from Ace wrapping to LE bilaterally. 1+ edema in below knee when bandaging ended. Pedal pulses intact.  Skin: Chronic skin changes noted bilaterally. Left lower extremity: 2 independent 2 cm diameter ulcerations on the anterior medial aspect of the mid tibia. Some purulent/serous fluid draining from lesion. Surrounding erythema present, but retreating inwards from marked borders. Right LE with numerous healing scabs/wounds and thickened texture. Large healing scab noted inferior to medial malleolus.         Laboratory:  Recent Labs Lab 01/08/16 1744 01/09/16 0410  WBC 10.8* 11.0*  HGB 9.6* 9.1*  HCT 28.6* 29.7*  PLT  --  519*    Recent Labs Lab 01/09/16 0410 01/10/16 0520  NA 137 138  K 4.3  4.2  CL 98* 102  CO2 23 22  BUN 12 16  CREATININE 1.13* 1.06*  CALCIUM 9.3 8.7*  PROT 7.0  --   BILITOT 0.4  --   ALKPHOS 97  --   ALT 10*  --   AST 21  --   GLUCOSE 102* [address] [place], DO 01/10/2016, 8:38 AM PGY-1, [facility] FPTS Intern pager: [phone], text pages welcome

## 2016-01-10 NOTE — Consult Note (Signed)
WOC wound consult note Reason for Consult: LE wound Patient familiar to this WOC nurse, seen for same at the time of her last admission. She continues to have open lesions on the LE.  Today she has some scabbed areas on the RLE that are all closed at this time and not draining.  She has bilateral LE redness, some mild hemosiderin staining with strong, palpable pulses.  She has lesions that appear just as they did before but now they are worse on the LLE. She as well at the time of her previous admission had a reddnened area on the left hip that has resolved. However she reports some hardness on the right hip. Dr. Deirdre Priesthambliss at the bedside to assess.  Wound type: LLE; ulcer, does not appear like typical venous stasis, just as before. Raised bullous type lesions with serous, yellow drainage. No odor.  Patient request for her to "squeeze" I have requested she not mess with the lesions.   Pressure Ulcer POA: No Measurement:LLE 3cm x 4cm x 0.2cm  Wound bed: raised, macerated, soft centrally.  Drainage (amount, consistency, odor) serosanguinous  Periwound: cellulitis, L>R Dressing procedure/placement/frequency: Will add xeroform as antibacterial and drying agent.  Medicine has added ACE for compression.  I will instruct patient to wrap from toes to patellar notch. REcommended therapeutic stockings rather than ACE however she is not able to don them currently and does not want to place with open ulcer.   I continue to recommend follow up with dermatology for unclear etiology of LE ulcers, she was followed by vein specialist in the past but they have not been able to figure out the etiology as well.    Discussed POC with patient and bedside nurse.  Re consult if needed, will not follow at this time. Thanks  Hamed Debella [employer] RN, CWOCN [phone])

## 2016-01-10 NOTE — Discharge Instructions (Signed)
Keep lower legs elevated as much as possible and wrapped/in compression stockings to help prevent fluid build up. You will need to take the Clindamycin four times daily through 3/27 to complete a 10 day course of antibiotics. [facility] the next 2-3 days.

## 2016-01-10 NOTE — Progress Notes (Addendum)
Patient A/O, no noted distress. Patient tolerated meds well. D/C IV. Administered ace compression wrap as order by wound nurse. Educated discharge orders: prescription (p/u [facility]), follow up appointments, and compression wraps on BLE. Staff safely wheeled patient to lobby to meet spouse.

## 2016-01-10 NOTE — Discharge Summary (Signed)
Family Medicine Teaching [facility] Discharge Summary  Patient name: [name] Medical record number: [id number] Date of birth: 07-Jun-1956 Age: 60 y.o. Gender: female Date of Admission: 01/08/2016  Date of Discharge: 01/10/2016 Admitting Physician: Carney Living, MD  Primary Care Provider: Norberto Sorenson, MD Consultants: None   Indication for Hospitalization: Cellulitis   Discharge Diagnoses/Problem List:  Patient Active Problem List   Diagnosis Date Noted  . Cellulitis of left lower extremity 01/09/2016  . Cellulitis 01/09/2016  . Drug-seeking behavior 01/08/2016  . Psoriatic arthritis (HCC)   . Cough   . Wheezing   . Cellulitis of both lower extremities 12/05/2015  . Depression 12/05/2015  . Chronic anemia 12/05/2015  . Chronic venous insufficiency 12/04/2015  . Alcohol abuse 11/21/2014  . Anxiety state 11/21/2014  . BMI 40.0-44.9, adult (HCC) 11/21/2014  . Acute blood loss anemia 09/25/2014  . Epistaxis 09/24/2014    Disposition: Home   Discharge Condition: Stable   Discharge Exam:  General: Female lying in bed with feet elevated, NAD  Cardiovascular: RRR. No murmurs appreciated.  Respiratory: CTAB. Normal WOB.  Abdomen: obese, soft, NTND  Extremities: Compression marks present from Ace wrapping to LE bilaterally. 1+ edema in below knee when bandaging ended. Pedal pulses intact.  Skin: Chronic skin changes noted bilaterally. Left lower extremity: 2 independent 2 cm diameter ulcerations on the anterior medial aspect of the mid tibia. Some purulent/serous fluid draining from lesion. Surrounding erythema present, but receding inwards from marked borders. Right LE with numerous healing scabs/wounds and thickened texture. Large healing scab noted inferior to medial malleolus.         Brief Hospital Course:  RUT BETTERTON is 60 y.o. female with recurrent LE cellulitis in the setting of venous insufficiency. Failed prior courses of Augmentin, and  Doxycycline; now with new failures with Keflex and Bactrim. Was given IV Clindamycin in the ED and then started on PO clindamycin. Erythema improved with recession from the marked borders on Clindamycin. Wound care was consulted during hospitalization who felt this may not be a typical cellulitis. Inpatient team agreed given recurrent presentation despite multiple courses of antibiotics. Wound care assisted patient on instructions for wrapping of LE. Patient was afebrile throughout hospital course and leukocytosis that was present at admission had resolved at time of discharge. She was instructed to follow up with PCP within 2-3 days after discharge for close monitoring.   Issues for Follow Up:  1. Consider dermatology follow up given recurrent, poor healing LE wounds. She has been followed by vein specialist in the past who has been unable to determine etiology of these recurrent wounds.  2. Patient to complete 10 day course of Clindamycin on 3/27. Patient to apply compression dressing or stockings at home for dependent edema.  3. HgB low (8.9-9.6) during hospitalization and previous iron studies consistent with iron deficiency anemia. HgB remained stable throughout hospitalization and patient was asymptomatic. Would recommend discussion regarding initiation of Fe supplement as she was previously discharged with Fe and it was no longer on her medication list at time of current admission.   Significant Procedures: None   Significant Labs and Imaging:   Recent Labs Lab 01/08/16 1744 01/09/16 0410 01/10/16 0908  WBC 10.8* 11.0* 6.8  HGB 9.6* 9.1* 8.9*  HCT 28.6* 29.7* 29.3*  PLT  --  519* 450*    Recent Labs Lab 01/09/16 0410 01/10/16 0520  NA 137 138  K 4.3 4.2  CL 98* 102  CO2 23 22  GLUCOSE 102*  87  BUN 12 16  CREATININE 1.13* 1.06*  CALCIUM 9.3 8.7*  ALKPHOS 97  --   AST 21  --   ALT 10*  --   ALBUMIN 3.4*  --      Results/Tests Pending at Time of Discharge: None    Discharge Medications:    Medication List    STOP taking these medications        cephALEXin 500 MG capsule  Commonly known as:  KEFLEX     fluconazole 150 MG tablet  Commonly known as:  DIFLUCAN     sulfamethoxazole-trimethoprim 800-160 MG tablet  Commonly known as:  BACTRIM DS,SEPTRA DS      TAKE these medications        amphetamine-dextroamphetamine 30 MG 24 hr capsule  Commonly known as:  ADDERALL XR  Take 30 mg by mouth 2 (two) times daily. Reported on 12/03/2015     clindamycin 300 MG capsule  Commonly known as:  CLEOCIN  Take 1 capsule (300 mg total) by mouth every 6 (six) hours.     DULoxetine 30 MG capsule  Commonly known as:  CYMBALTA  Take 2 capsules by mouth daily.     EPINEPHrine 0.3 mg/0.3 mL Soaj injection  Commonly known as:  EPI-PEN  Inject 0.3 mLs (0.3 mg total) into the muscle once.     FLUoxetine 20 MG capsule  Commonly known as:  PROZAC  Take 40 mg by mouth 2 (two) times daily.     HYDROcodone-acetaminophen 5-325 MG tablet  Commonly known as:  NORCO  Take 1 tablet by mouth every 6 (six) hours as needed.     mupirocin ointment 2 %  Commonly known as:  BACTROBAN  Apply 1 application topically 3 (three) times daily.     traZODone 50 MG tablet  Commonly known as:  DESYREL  TAKE 50 MG BY MOUTH DAILY AT BEDTIME AS NEEDED FOR SLEEP     tretinoin 0.025 % cream  Commonly known as:  RETIN-A  Apply topically at bedtime.        Discharge Instructions: Please refer to Patient Instructions section of EMR for full details.  Patient was counseled important signs and symptoms that should prompt return to medical care, changes in medications, dietary instructions, activity restrictions, and follow up appointments.   Follow-Up Appointments: Follow-up Information    [facility] Schedule an appointment as soon as possible for a visit in 2 days.   Specialty:  Family Medicine   Why:  For Hospital Followup   Contact information:   [address] [place] [id number] [phone]       Arvilla MarketCatherine Lauren Wallace, DO 01/10/2016, 12:27 PM PGY-1, [facility]

## 2016-01-14 ENCOUNTER — Ambulatory Visit (INDEPENDENT_AMBULATORY_CARE_PROVIDER_SITE_OTHER): Payer: BC Managed Care – PPO | Admitting: Physician Assistant

## 2016-01-14 VITALS — BP 128/80 | HR 93 | Temp 98.6°F | Resp 16 | Ht 66.0 in | Wt 283.0 lb

## 2016-01-14 DIAGNOSIS — I8311 Varicose veins of right lower extremity with inflammation: Secondary | ICD-10-CM | POA: Diagnosis not present

## 2016-01-14 DIAGNOSIS — L03119 Cellulitis of unspecified part of limb: Secondary | ICD-10-CM

## 2016-01-14 DIAGNOSIS — I872 Venous insufficiency (chronic) (peripheral): Secondary | ICD-10-CM | POA: Diagnosis not present

## 2016-01-14 DIAGNOSIS — D649 Anemia, unspecified: Secondary | ICD-10-CM | POA: Diagnosis not present

## 2016-01-14 MED ORDER — HYDROCODONE-ACETAMINOPHEN 5-325 MG PO TABS: 1.0000 | ORAL_TABLET | Freq: Four times a day (QID) | ORAL | Status: DC | PRN

## 2016-01-14 MED ORDER — HYDROCODONE-ACETAMINOPHEN 7.5-325 MG PO TABS: 1.0000 | ORAL_TABLET | Freq: Four times a day (QID) | ORAL | Status: DC | PRN

## 2016-01-14 NOTE — Progress Notes (Signed)
[facility] [address], [place] [id number] [phone]  Date:  01/14/2016   Name:  Sydney HoffFonda T Cahue   DOB:  1956-04-29   MRN:  [id number]  PCP:  Norberto SorensonSHAW,EVA, MD    Chief Complaint: Follow-up   History of Present Illness:  This is a 60 y.o. female with PMH alcohol abuse, recurrent cellulitis, iron deficient anemia, psoriatic arthritis, chronic venous insufficiency who is presenting for wound check. Pt was hospitalized 2/10-2/19 for bilateral LE cellulitis. Failed multiple IV antibiotics. Was eventually discharged with 14 day course of po keflex. Symptoms resolved. She return here on 3/14 with recurrence of symptoms. Was started on po keflex and bactrim. Returned 3/17 for recheck and symptoms worse. Was advised to go to hospital again. She was again admitted 3/17-3/19. IV clinda produced resolution in sx. She was discharged on po clinda and told to follow up with PCP. Today she is reporting some improvement in symptoms (no fever, a little less pain) but overall symptoms have remained the same. All wound cultures have been negative. Recommended she follow up with derm outpatient. She has been wrapping her legs with ace wraps daily. She washes the ace wraps every couple days.  Pt is chronically anemic with hgb around 8-9. She has not taken iron supplements continuously d/t causing constipation. Pt states she is chronically constipated anyway. Has 2 BMs per week. She does admit to chronic fatigue as well.  Review of Systems:  Review of Systems See HPI  Patient Active Problem List   Diagnosis Date Noted  . Cellulitis of left lower extremity 01/09/2016  . Cellulitis 01/09/2016  . Drug-seeking behavior 01/08/2016  . Psoriatic arthritis (HCC)   . Cough   . Wheezing   . Cellulitis of both lower extremities 12/05/2015  . Depression 12/05/2015  . Chronic anemia 12/05/2015  . Chronic venous insufficiency 12/04/2015  . Alcohol abuse 11/21/2014  . Anxiety state 11/21/2014   . BMI 40.0-44.9, adult (HCC) 11/21/2014  . Acute blood loss anemia 09/25/2014  . Epistaxis 09/24/2014    Prior to Admission medications   Medication Sig Start Date End Date Taking? Authorizing Provider  amphetamine-dextroamphetamine (ADDERALL XR) 30 MG 24 hr capsule Take 30 mg by mouth 2 (two) times daily. Reported on 12/03/2015 03/06/15  Yes Historical Provider, MD  clindamycin (CLEOCIN) 300 MG capsule Take 1 capsule (300 mg total) by mouth every 6 (six) hours. 01/10/16 01/18/16 Yes Arvilla Marketatherine Lauren Wallace, DO  DULoxetine (CYMBALTA) 30 MG capsule Take 2 capsules by mouth daily. 02/12/15  Yes Historical Provider, MD  EPINEPHrine 0.3 mg/0.3 mL IJ SOAJ injection Inject 0.3 mLs (0.3 mg total) into the muscle once. Patient taking differently: Inject 0.3 mg into the muscle daily as needed (allergic reaction).  05/07/15  Yes Lorre NickAnthony Allen, MD  fluconazole (DIFLUCAN) 150 MG tablet Take 1 tablet (150 mg total) by mouth daily. Take 1 tablet prn for yeast infection. Take the 2nd tab 72 hours after 1st tab if not resolved. 01/10/16  Yes Arvilla Marketatherine Lauren Wallace, DO  FLUoxetine (PROZAC) 20 MG capsule Take 40 mg by mouth 2 (two) times daily.    Yes Historical Provider, MD  HYDROcodone-acetaminophen (NORCO) 5-325 MG tablet Take 1 tablet by mouth every 6 (six) hours as needed. Patient taking differently: Take 1 tablet by mouth every 6 (six) hours as needed for moderate pain.  12/13/15  Yes Palma HolterKanishka G Gunadasa, MD  mupirocin ointment (BACTROBAN) 2 % Apply 1 application topically 3 (three) times daily. 01/05/16  Yes  Sherren Mocha, MD  traZODone (DESYREL) 50 MG tablet TAKE 50 MG BY MOUTH DAILY AT BEDTIME AS NEEDED FOR SLEEP 12/11/14  Yes Historical Provider, MD  tretinoin (RETIN-A) 0.025 % cream Apply topically at bedtime. 01/05/16  Yes Sherren Mocha, MD    No Known Allergies  Past Surgical History  Procedure Laterality Date  . Total knee revision  07/30/2010    right; with lateral release  . Anterior cervical  decomp/discectomy fusion  05/09/2008    C5-6, C6-7; with arthrodesis also  . Rotator cuff repair w/ distal clavicle excision  01/06/2006    right  . 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 Total knee arthroplasty  11/07/2003    right  . Shoulder arthroscopy  2003    rt  . Knee arthroscopy  2004    rt and lt knee  . Shoulder arthroscopy  2003    rt  . Tonsillectomy    . Gastric bypass  2002    Social History  Substance Use Topics  . Smoking status: Former Smoker    Quit date: 05/16/1981  . Smokeless tobacco: Never Used  . Alcohol Use: 3.0 oz/week    5 Cans of beer per week    Family History  Problem Relation Age of Onset  . Heart disease Mother   . Cancer Father     Medication list has been reviewed and updated.  Physical Examination:  Physical Exam  Constitutional: She is oriented to person, place, and time. She appears well-developed and well-nourished. No distress.  HENT:  Head: Normocephalic and atraumatic.  Right Ear: Hearing normal.  Left Ear: Hearing normal.  Nose: Nose normal.  Eyes: Conjunctivae and lids are normal. Right eye exhibits no discharge. Left eye exhibits no discharge. No scleral icterus.  Pulmonary/Chest: Effort normal. No respiratory distress.  Musculoskeletal: Normal range of motion.  Neurological: She is alert and oriented to person, place, and time.  Skin: Skin is warm and dry.  Venous stasis dermatitis on right leg with several scabs, not actively draining. Non-tender. Right leg erythematous, tender, 3 abscesses present. Some purulence expressed with pressure. Wounds cleansed and bilateral leg rewrapped with new ace wraps.  Psychiatric: She has a normal mood and affect. Her speech is normal and behavior is normal. Thought content normal.   BP 128/80 mmHg  Pulse 93  Temp(Src) 98.6 F (37 C) (Oral)  Resp 16  Ht  (1.676 m)  Wt 283 lb (128.368 kg)  BMI 45.70 kg/m2  SpO2 96%  Assessment and Plan:  1. Recurrent cellulitis of  lower leg 2. Venous stasis dermatitis right lower extremity 3. Chronic venous insufficiency No real improvement in symptoms. Continue clindamycin. Continue to cleanse wounds and wrap legs daily. Symptoms not acting like a normal cellulitis and all wound cultures negative. Not responding well to any abx. Referred urgently to dermatology. Refilled norco -- we did discuss we need to have a discussion about her norco use but pt IS in pain currently with this, will discuss further after skin condition resolved. - Ambulatory referral to Dermatology - HYDROcodone-acetaminophen (NORCO) 7.5-325 MG tablet; Take 1 tablet by mouth every 6 (six) hours as needed for moderate pain.  Dispense: 20 tablet; Refill: 0  4. Chronic anemia We discussed the importance of taking iron supplements BID and using either prune juice or miralax daily to prevent constipation.   [place] Miners Dyke Brackett, MHS [facility]  01/23/2016

## 2016-01-14 NOTE — Patient Instructions (Addendum)
Continue clindamycin three times a day until finished. You can apply bacitracin to wounds three times a day and see if helps. Continue to wrap ace wraps for compression to help swelling You will get phone call to make appt with dermatology Start iron 325 mg twice a day for 1 month, return in 1 month for repeat blood work. Take miralax to help prevent constipation.     IF you received an x-ray today, you will receive an invoice from [facility]. Please contact [facility] at [phone] with questions or concerns regarding your invoice.   IF you received labwork today, you will receive an invoice from [facility]. Please contact [facility] at [phone] with questions or concerns regarding your invoice.   Our billing staff will not be able to assist you with questions regarding bills from these companies.  You will be contacted with the lab results as soon as they are available. The fastest way to get your results is to activate your My Chart account. Instructions are located on the last page of this paperwork. If you have not heard from us regarding the results in 2 weeks, please contact this office.

## 2016-01-18 ENCOUNTER — Telehealth: Payer: Self-pay

## 2016-01-18 NOTE — Telephone Encounter (Signed)
Wants to talk with nicole about her wound care -she states that she has came here three times for this and everytime she goes to the hospital -should she just return there since the wound is not better  Best number [phone]

## 2016-01-21 ENCOUNTER — Other Ambulatory Visit: Payer: Self-pay | Admitting: Internal Medicine

## 2016-01-21 NOTE — Telephone Encounter (Signed)
Pt would like a refill on her clindamycin (CLEOCIN) 300 MG capsule [[id number]][[id number]]. She says the wound isn't completely healed yet. Pharmacy:  [facility] [id number] - [place], [place] - [address] [phone]

## 2016-01-21 NOTE — Telephone Encounter (Signed)
She needs to return to clinic.  This was fairly serious, and we need to consider what the best treatment is.  She may likely get more clindamycin, but we should follow up on this wound.  She was on the clindamycin for 10 days, it appears including IV abx.  Please return asap before this becomes a bigger problem.

## 2016-01-22 NOTE — Telephone Encounter (Signed)
It appears she has not heard about dermatology referral yet. I agree with [name], she should return so we can follow up on her wound until she is able to get in with derm.

## 2016-01-22 NOTE — Telephone Encounter (Signed)
Spoke with pt, she will RTC.

## 2016-01-23 ENCOUNTER — Ambulatory Visit (INDEPENDENT_AMBULATORY_CARE_PROVIDER_SITE_OTHER): Payer: BC Managed Care – PPO | Admitting: Physician Assistant

## 2016-01-23 VITALS — BP 130/88 | HR 87 | Temp 97.7°F | Resp 16 | Ht 66.0 in | Wt 275.0 lb

## 2016-01-23 DIAGNOSIS — D649 Anemia, unspecified: Secondary | ICD-10-CM

## 2016-01-23 DIAGNOSIS — I872 Venous insufficiency (chronic) (peripheral): Secondary | ICD-10-CM

## 2016-01-23 DIAGNOSIS — L03119 Cellulitis of unspecified part of limb: Secondary | ICD-10-CM

## 2016-01-23 DIAGNOSIS — I8311 Varicose veins of right lower extremity with inflammation: Secondary | ICD-10-CM

## 2016-01-23 LAB — C-REACTIVE PROTEIN: CRP: 5.7 mg/dL — ABNORMAL HIGH (ref <0.60)

## 2016-01-23 LAB — POCT CBC
Granulocyte percent: 66.9 %G (ref 37–80)
HCT, POC: 30.5 % — AB (ref 37.7–47.9)
Hemoglobin: 10.8 g/dL — AB (ref 12.2–16.2)
LYMPH, POC: 2.3 (ref 0.6–3.4)
MCH, POC: 30.5 pg (ref 27–31.2)
MCHC: 35.5 g/dL — AB (ref 31.8–35.4)
MCV: 85.8 fL (ref 80–97)
MID (cbc): 0.7 (ref 0–0.9)
MPV: 5.9 fL (ref 0–99.8)
PLATELET COUNT, POC: 663 10*3/uL — AB (ref 142–424)
POC Granulocyte: 6.2 (ref 2–6.9)
POC LYMPH %: 25.3 % (ref 10–50)
POC MID %: 7.8 %M (ref 0–12)
RBC: 3.55 M/uL — AB (ref 4.04–5.48)
RDW, POC: 17 %
WBC: 9.2 10*3/uL (ref 4.6–10.2)

## 2016-01-23 LAB — POCT SEDIMENTATION RATE: POCT SED RATE: 72 mm/h — AB (ref 0–22)

## 2016-01-23 MED ORDER — CLINDAMYCIN HCL 300 MG PO CAPS: 300.0000 mg | ORAL_CAPSULE | Freq: Three times a day (TID) | ORAL | Status: DC

## 2016-01-23 NOTE — Progress Notes (Signed)
[facility] [address] [place] [address] [phone]  Date:  01/23/2016   Name:  [name]   DOB:  1955-11-04   MRN:  [id number]  PCP:  Norberto Sorenson, MD    Chief Complaint: Follow-up   History of Present Illness:  This is a 60 y.o. female with PMH psoriatic arthritis, depression, chronic anemia, recurrent cellulitis, alcohol abuse who is presenting for follow up cellulitis. Pt was hospitalized 2/10-2/19 for bilateral LE cellulitis. Failed multiple IV antibiotics. Was eventually discharged with 14 day course of po keflex. Symptoms resolved. She return here on 3/14 with recurrence of symptoms. Was started on po keflex and bactrim. Returned 3/17 for recheck and symptoms worse. Was advised to go to hospital again. She was again admitted 3/17-3/19. Of note, all wound cultures have been negative. IV clinda produced resolution in sx. She was discharged on po clinda and followed up with me on 3/23 with only minimal resolution of symptoms. I had her continue clinda and referred urgently to dermatology. Pt has not heard about referral yet. She finished clinda 2 days ago. She reports symptoms are about the same still, pain is worsening some since stopping the abx. She denies fever. States she is feeling ok.  Pt started on iron 325 mg BID and so far is tolerating well.  Review of Systems:  Review of Systems See HPI  Patient Active Problem List   Diagnosis Date Noted  . Psoriatic arthritis (HCC)   . Cough   . Wheezing   . Depression 12/05/2015  . Chronic anemia 12/05/2015  . Chronic venous insufficiency 12/04/2015  . Alcohol abuse 11/21/2014  . Anxiety state 11/21/2014  . BMI 40.0-44.9, adult (HCC) 11/21/2014  . Epistaxis 09/24/2014    Prior to Admission medications   Medication Sig Start Date End Date Taking? Authorizing Provider  amphetamine-dextroamphetamine (ADDERALL XR) 30 MG 24 hr capsule Take 30 mg by mouth 2 (two) times daily. Reported on 12/03/2015 03/06/15   Yes Historical Provider, MD  clindamycin (CLEOCIN) 300 MG capsule Take 300 mg by mouth 3 (three) times daily.   Yes Historical Provider, MD  DULoxetine (CYMBALTA) 30 MG capsule Take 2 capsules by mouth daily. 02/12/15  Yes Historical Provider, MD  EPINEPHrine 0.3 mg/0.3 mL IJ SOAJ injection Inject 0.3 mLs (0.3 mg total) into the muscle once. Patient taking differently: Inject 0.3 mg into the muscle daily as needed (allergic reaction).  05/07/15  Yes Lorre Nick, MD  FLUoxetine (PROZAC) 20 MG capsule Take 40 mg by mouth 2 (two) times daily.    Yes Historical Provider, MD  HYDROcodone-acetaminophen (NORCO) 7.5-325 MG tablet Take 1 tablet by mouth every 6 (six) hours as needed for moderate pain. 01/14/16  Yes Lanier Clam V, PA-C  mupirocin ointment (BACTROBAN) 2 % Apply 1 application topically 3 (three) times daily. 01/05/16  Yes Sherren Mocha, MD  traZODone (DESYREL) 50 MG tablet TAKE 50 MG BY MOUTH DAILY AT BEDTIME AS NEEDED FOR SLEEP 12/11/14  Yes Historical Provider, MD  tretinoin (RETIN-A) 0.025 % cream Apply topically at bedtime. 01/05/16  Yes Sherren Mocha, MD       Arvilla Market, DO    No Known Allergies  Past Surgical History  Procedure Laterality Date  . Total knee revision  07/30/2010    right; with lateral release  . Anterior cervical decomp/discectomy fusion  05/09/2008    C5-6, C6-7; with arthrodesis also  . Rotator cuff repair w/ distal clavicle excision  01/06/2006  right  . 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 Total knee arthroplasty  11/07/2003    right  . Shoulder arthroscopy  2003    rt  . Knee arthroscopy  2004    rt and lt knee  . Shoulder arthroscopy  2003    rt  . Tonsillectomy    . Gastric bypass  2002    Social History  Substance Use Topics  . Smoking status: Former Smoker    Quit date: 05/16/1981  . Smokeless tobacco: Never Used  . Alcohol Use: 3.0 oz/week    5 Cans of beer per week    Family History  Problem Relation Age of Onset  .  Heart disease Mother   . Cancer Father     Medication list has been reviewed and updated.  Physical Examination:  Physical Exam  Constitutional: She is oriented to person, place, and time. She appears well-developed and well-nourished. No distress.  HENT:  Head: Normocephalic and atraumatic.  Right Ear: Hearing normal.  Left Ear: Hearing normal.  Nose: Nose normal.  Eyes: Conjunctivae and lids are normal. Right eye exhibits no discharge. Left eye exhibits no discharge. No scleral icterus.  Pulmonary/Chest: Effort normal. No respiratory distress.  Musculoskeletal: Normal range of motion.  Neurological: She is alert and oriented to person, place, and time.  Skin: Skin is warm and dry.  Right leg with venous stasis dermatitis - firm, hyperpigmented skin. Several crusted scabs. No erythema or drainage.  Left leg with erythema from above ankle to 3 inches below knee. Area of erythema unchanged. Still with puckering fluctuance abscesses, one appears bigger, appears to have coalesced. Alcohol applied to the bigger abscess and punctured with #11 blade without any purulence expressed. Tenderness present over erythema.  Psychiatric: She has a normal mood and affect. Her speech is normal and behavior is normal. Thought content normal.   BP 130/88 mmHg  Pulse 87  Temp(Src) 97.7 F (36.5 C) (Oral)  Resp 16  Ht  (1.676 m)  Wt 275 lb (124.739 kg)  BMI 44.41 kg/m2  SpO2 96%  Procedure: Verbal consent obtained. Skin cleaned with alcohol and anesthetized with 1 cc 1% lido with epi. Sterile field applied. 2 mm punch biopsy obtained. Hemostasis achieved with applying pressure. Wound dressed and wound care discussed.  Results for orders placed or performed in visit on 01/23/16  POCT SEDIMENTATION RATE  Result Value Ref Range   POCT SED RATE 72 (A) 0 - 22 mm/hr  POCT CBC  Result Value Ref Range   WBC 9.2 4.6 - 10.2 K/uL   Lymph, poc 2.3 0.6 - 3.4   POC LYMPH PERCENT 25.3 10 - 50 %L   MID  (cbc) 0.7 0 - 0.9   POC MID % 7.8 0 - 12 %M   POC Granulocyte 6.2 2 - 6.9   Granulocyte percent 66.9 37 - 80 %G   RBC 3.55 (A) 4.04 - 5.48 M/uL   Hemoglobin 10.8 (A) 12.2 - 16.2 g/dL   HCT, POC 16.1 (A) 09.6 - 47.9 %   MCV 85.8 80 - 97 fL   MCH, POC 30.5 27 - 31.2 pg   MCHC 35.5 (A) 31.8 - 35.4 g/dL   RDW, POC 04.5 %   Platelet Count, POC 663 (A) 142 - 424 K/uL   MPV 5.9 0 - 99.8 fL    Assessment and Plan:  1. Recurrent cellulitis of lower leg 2. Venous stasis dermatitis of right lower extremity 3.  Chronic venous insufficiency Sed rate elevated to 72. CBC wnl. CRP pending. Has not heard about dermatology referral yet -- will send message to referrals to see if we can get her an appt this upcoming week. Did a punch bx and sent for pathology. Send for doppler u/s to rule out clot. Put back on 1 week of clinda since pain seems to be worsening since finishing course 2 days ago. Hopefully she will be seen by dermatology this week and they can counsel on whether to continue or discontinue clinda. - POCT SEDIMENTATION RATE - C-reactive protein - POCT CBC - Ultrasound doppler venous legs bilat; Future - Dermatology pathology - clindamycin (CLEOCIN) 300 MG capsule; Take 1 capsule (300 mg total) by mouth 3 (three) times daily.  Dispense: 21 capsule; Refill: 0  4. Chronic anemia Last hgb 3/18 was 9.1. She has been doing BID iron for past week and hgb 10.8 today. Continue BID for another 3-4 weeks, will reassess at that time.   Roswell MinersNicole V. Dyke BrackettBush, PA-C, MHS [facility]  01/23/2016

## 2016-01-23 NOTE — Patient Instructions (Addendum)
Eat lots of yogurt or take a probiotic daily Take the iron daily Go back on cleocin three times a day for a week. I will call you with your lab results I will contact our referrals dept about getting you in with derm asap.    IF you received an x-ray today, you will receive an invoice from [facility]. Please contact [facility] at [phone] with questions or concerns regarding your invoice.   IF you received labwork today, you will receive an invoice from [facility]. Please contact [facility] at [phone] with questions or concerns regarding your invoice.   Our billing staff will not be able to assist you with questions regarding bills from these companies.  You will be contacted with the lab results as soon as they are available. The fastest way to get your results is to activate your My Chart account. Instructions are located on the last page of this paperwork. If you have not heard from us regarding the results in 2 weeks, please contact this office.

## 2016-01-25 ENCOUNTER — Telehealth: Payer: Self-pay

## 2016-01-25 NOTE — Telephone Encounter (Signed)
Dr Clelia CroftShaw, PA is needed for tretinoin cream. I don't see in notes what Dx this is being Rxd for. Can you please advise and if you know what other meds pt has tried for Dx?

## 2016-01-25 NOTE — Telephone Encounter (Signed)
It is being used for wrinkle treatment and i don't know if anything else she has used - she just asked for that med specifically. sorry

## 2016-01-26 NOTE — Telephone Encounter (Signed)
Notified pharm that ins does not cover this for cosmetic reasons (wrinkles, aging). Pt will have to pay cash.

## 2016-02-02 ENCOUNTER — Telehealth: Payer: Self-pay | Admitting: Physician Assistant

## 2016-02-02 ENCOUNTER — Other Ambulatory Visit: Payer: Self-pay | Admitting: Physician Assistant

## 2016-02-02 DIAGNOSIS — B3731 Acute candidiasis of vulva and vagina: Secondary | ICD-10-CM

## 2016-02-02 DIAGNOSIS — B373 Candidiasis of vulva and vagina: Secondary | ICD-10-CM

## 2016-02-02 MED ORDER — FLUCONAZOLE 150 MG PO TABS: 150.0000 mg | ORAL_TABLET | Freq: Every day | ORAL | Status: DC

## 2016-02-02 NOTE — Telephone Encounter (Signed)
I called in pt diflucan for yeast infection. She called back asking if I could also call in norco for her, she states she is out. She gets from her rheumatologist regularly. I advised that she call her rheumatologist and see if she will refill. We discussed the importance of getting controlled substances from one provider only. She understood.

## 2016-02-11 ENCOUNTER — Encounter: Payer: Self-pay | Admitting: Physician Assistant

## 2016-02-11 DIAGNOSIS — L080 Pyoderma: Secondary | ICD-10-CM | POA: Insufficient documentation

## 2016-02-19 ENCOUNTER — Telehealth: Payer: Self-pay

## 2016-02-19 DIAGNOSIS — L03119 Cellulitis of unspecified part of limb: Secondary | ICD-10-CM

## 2016-02-19 DIAGNOSIS — M545 Low back pain: Secondary | ICD-10-CM

## 2016-02-19 MED ORDER — HYDROCODONE-ACETAMINOPHEN 7.5-325 MG PO TABS: 1.0000 | ORAL_TABLET | Freq: Four times a day (QID) | ORAL | Status: DC | PRN

## 2016-02-19 NOTE — Telephone Encounter (Signed)
Pt here saying her rheumatologist will no longer prescribe her norco. She is needing a refill. I agreed to give her #30 but referred her to a pain clinic for further management.

## 2016-02-19 NOTE — Telephone Encounter (Signed)
[name],  Pt called inquiring if you would be willing to refill her rx for Hydrocodone. Per patient she was getting rx from Dr. Azucena FallenMichelle Young, but she has stop giving her the medication. Patient states that her legs are healing slowly but surely, she is just in a lot of pain. Please advise if you are ok with refilling medication.    Thanks    [employer]ocelyn

## 2016-02-19 NOTE — Telephone Encounter (Signed)
Pt is needing to talk with nicole about her leg pain    Best number [phone]

## 2016-03-22 ENCOUNTER — Other Ambulatory Visit: Payer: Self-pay

## 2016-03-22 DIAGNOSIS — L03119 Cellulitis of unspecified part of limb: Secondary | ICD-10-CM

## 2016-03-22 NOTE — Telephone Encounter (Signed)
Pt is needing a refill on her hydrocodone until she can get in with the pain clinic  Best number [phone]

## 2016-03-24 NOTE — Telephone Encounter (Signed)
Dr Clelia CroftShaw, Joni Reiningicole has seen pt most recently, but it looks like you are her PCP, so will send to you in Nicole's absence. I checked referral notes and the referral was sent to pain clinic on 5/1, but I don't see any further notes about  Clinic scheduling pt yet. Pended Rx.

## 2016-03-24 NOTE — Telephone Encounter (Signed)
No controlled med refills. If she has an acute issue, she needs to come in and be seen.

## 2016-03-25 ENCOUNTER — Telehealth: Payer: Self-pay

## 2016-03-25 NOTE — Telephone Encounter (Signed)
[facility]ver FisherShaw[facility].  Referrals, I thought I had sent this to you to ask you to check on her referral to pain clinic, but don't see you in routing Hx. Thanks!

## 2016-03-25 NOTE — Telephone Encounter (Signed)
Advised pt to come in to be seen. Patient insisted that she should not have to come in since she has been seen here before. Pt is requesting that Dr. Clelia CroftShaw fills her prescription for Norco. Please advise.

## 2016-03-25 NOTE — Telephone Encounter (Signed)
Patient states she cannot get into the pain management clinic for three months and she cannot live with the pain that long.   HYDROcodone-acetaminophen (NORCO) 7.5-325 MG tablet   Refill request  [phone] (M)

## 2016-03-26 NOTE — Telephone Encounter (Signed)
No, I do not refill pain medication without at office visit for anyone. If she is having chronic pain for which she needs pain medication, she needs to come in and discuss it with a provider so we can make sure she has an appointment with a pain clinic and then decide what we need to do in the interim.

## 2016-03-28 NOTE — Telephone Encounter (Signed)
LM advising pt.  ?

## 2016-04-02 NOTE — Telephone Encounter (Signed)
Left message for patient to return call.

## 2016-04-05 ENCOUNTER — Ambulatory Visit (INDEPENDENT_AMBULATORY_CARE_PROVIDER_SITE_OTHER): Payer: BC Managed Care – PPO | Admitting: Family Medicine

## 2016-04-05 VITALS — BP 124/80 | HR 85 | Temp 97.9°F | Resp 16 | Ht 66.0 in | Wt 274.0 lb

## 2016-04-05 DIAGNOSIS — L03119 Cellulitis of unspecified part of limb: Secondary | ICD-10-CM | POA: Diagnosis not present

## 2016-04-05 DIAGNOSIS — L4050 Arthropathic psoriasis, unspecified: Secondary | ICD-10-CM

## 2016-04-05 DIAGNOSIS — G894 Chronic pain syndrome: Secondary | ICD-10-CM

## 2016-04-05 MED ORDER — HYDROCODONE-ACETAMINOPHEN 7.5-325 MG PO TABS: 1.0000 | ORAL_TABLET | Freq: Three times a day (TID) | ORAL | Status: DC | PRN

## 2016-04-05 NOTE — Telephone Encounter (Signed)
The referral is still in review by Dr Vear ClockPhillips.  The new patient coordinator said that he will ask Dr Vear ClockPhillips about the referral after he finishes seeing patients at 4:00pm today.

## 2016-04-05 NOTE — Progress Notes (Signed)
By signing my name below, I, [name], attest that this documentation has been prepared under the direction and in the presence of Sydney SorensonEva Shaw, MD.  Electronically Signed: Arvilla MarketMesha Berry, Medical Scribe. 04/05/2016. 1:20 PM.  Subjective:    Patient ID: [name], female    DOB: 28-May-1956, 60 y.o.   MRN: [id number]  HPI Chief Complaint  Patient presents with  . Medication Refill    Hydrocodone    HPI Comments: Sydney HoffFonda T Heward is a 60 y.o. female who presents to the [facility] for a medication refill. Pt would also like a recommendation for a new rheumatologist. Pt[place] rheumat[place] Pt doesn't mind coming to [place] for her new doctor. Pt had a ulcer and had hepatemesis and was in the hospital. Pt doesn't want to get addicted but she has pain with ambulation. Pt last saw rheumatologist Feb 2017. Pt has an appt with pain management in end of Aug. Pt uses hydrocodone about TIB: at night, in the morning, and depending on what she's doing, one in the middle of the day.  Pt saw Dr. Sandria ManlyLove less than 2 weeks ago. Pt's legs has more edema went to the hospital for 9 days and went home for a week and came back to the hospital for 3 more days. Pt had psoriasis with infection and likely an allergic reaction to something that was being put on her skin. Pt uses support stockings and she doesn't wear it as much since its hot outside.  Pt wants to lose weight.  Patient Active Problem List   Diagnosis Date Noted  . 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 Psoriatic arthritis (HCC)   . Cough   . Wheezing   . Depression 12/05/2015  . Chronic anemia 12/05/2015  . Chronic venous insufficiency 12/04/2015  . Alcohol abuse 11/21/2014  . Anxiety state 11/21/2014  . BMI 40.0-44.9, adult (HCC) 11/21/2014  . Epistaxis 09/24/2014   Past Medical History  Diagnosis Date  . Depression   . Arthritis   . Seasonal allergies   . Anemia   . Obese   . Sleep apnea 2012    took test in  [place]-told to use cpap-could not ude  . MRSA (methicillin resistant Staphylococcus aureus)    Past Surgical History  Procedure Laterality Date  . Total knee revision  07/30/2010    right; with lateral release  . Anterior cervical decomp/discectomy fusion  05/09/2008    C5-6, C6-7; with arthrodesis also  . Rotator cuff repair w/ distal clavicle excision  01/06/2006    right  . 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 Total knee arthroplasty  11/07/2003    right  . Shoulder arthroscopy  2003    rt  . Knee arthroscopy  2004    rt and lt knee  . Shoulder arthroscopy  2003    rt  . Tonsillectomy    . Gastric bypass  2002   No Known Allergies Prior to Admission medications   Medication Sig Start Date End Date Taking? Authorizing Provider  amphetamine-dextroamphetamine (ADDERALL XR) 30 MG 24 hr capsule Take 30 mg by mouth 2 (two) times daily. Reported on 12/03/2015 03/06/15   Historical Provider, MD  clindamycin (CLEOCIN) 300 MG capsule Take 1 capsule (300 mg total) by mouth 3 (three) times daily. 01/23/16   Dorna LeitzNicole V Bush, PA-C  DULoxetine (CYMBALTA) 30 MG capsule Take 2 capsules by mouth daily. 02/12/15   Historical Provider, MD  EPINEPHrine  0.3 mg/0.3 mL IJ SOAJ injection Inject 0.3 mLs (0.3 mg total) into the muscle once. Patient taking differently: Inject 0.3 mg into the muscle daily as needed (allergic reaction).  05/07/15   Lorre Nick, MD  fluconazole (DIFLUCAN) 150 MG tablet Take 1 tablet (150 mg total) by mouth daily. Take 1 tablet prn for yeast infection. Take the 2nd tab 72 hours after 1st tab if not resolved. 02/02/16   Dorna Leitz, PA-C  FLUoxetine (PROZAC) 20 MG capsule Take 40 mg by mouth 2 (two) times daily.     Historical Provider, MD  HYDROcodone-acetaminophen (NORCO) 7.5-325 MG tablet Take 1 tablet by mouth every 6 (six) hours as needed for moderate pain. 02/19/16   Dorna Leitz, PA-C  mupirocin ointment (BACTROBAN) 2 % Apply 1 application topically 3 (three) times  daily. 01/05/16   Sherren Mocha, MD  traZODone (DESYREL) 50 MG tablet TAKE 50 MG BY MOUTH DAILY AT BEDTIME AS NEEDED FOR SLEEP 12/11/14   Historical Provider, MD  tretinoin (RETIN-A) 0.025 % cream Apply topically at bedtime. 01/05/16   Sherren Mocha, MD   Social History   Social History  . Marital Status: Married    Spouse Name: N/A  . Number of Children: N/A  . Years of Education: N/A   Occupational History  . Not on file.   Social History Main Topics  . Smoking status: Former Smoker    Quit date: 05/16/1981  . Smokeless tobacco: Never Used  . Alcohol Use: 3.0 oz/week    5 Cans of beer per week  . Drug Use: No  . Sexual Activity: Yes     Comment: INTERCOURSE AGE 2, SEXUAL PARTNERS MORE THAN 5   Other Topics Concern  . Not on file   Social History Narrative    Review of Systems  Musculoskeletal: Positive for arthralgias.    Depression screen [facility] 2/9 04/05/2016 01/23/2016 01/14/2016 01/08/2016 01/05/2016  Decreased Interest 0 0 0 0 0  Down, Depressed, Hopeless 0 0 0 0 0  PHQ - 2 Score 0 0 0 0 0    Objective:  BP 124/80 mmHg  Pulse 85  Temp(Src) 97.9 F (36.6 C) (Oral)  Resp 16  Ht  (1.676 m)  Wt 274 lb (124.286 kg)  BMI 44.25 kg/m2  SpO2 97%  Physical Exam  Constitutional: She appears well-developed and well-nourished. No distress.  HENT:  Head: Normocephalic and atraumatic.  Eyes: Conjunctivae are normal.  Neck: Neck supple.  Cardiovascular: Normal rate, regular rhythm and normal heart sounds.  Exam reveals no gallop.   No murmur heard. Pulmonary/Chest: Effort normal and breath sounds normal. No respiratory distress. She has no wheezes. She has no rales.  Musculoskeletal: She exhibits edema (1+ pitting edema in her lower extremities).  Neurological: She is alert.  Skin: Skin is warm and dry.  Non-blanching  Psychiatric: She has a normal mood and affect. Her behavior is normal.  Nursing note and vitals reviewed.    Assessment & Plan:   1. Psoriatic arthritis  (HCC) - pts rheumatologist Dr. Dareen Piano has moved out of the state - she would like to establish with a new rheum provider for mngmnt  2. Recurrent cellulitis of lower leg   3. Chronic pain syndrome - pt has an appt to establish with pain clinic for medical mngmnt of her chronic pain in August 2017. Refills below provided to bridge her to that appt.    Orders Placed This Encounter  Procedures  . Ambulatory referral  to Rheumatology    Referral Priority:  Routine    Referral Type:  Consultation    Referral Reason:  Specialty Services Required    Requested Specialty:  Rheumatology    Number of Visits Requested:  1    Meds ordered this encounter  Medications  . HYDROcodone-acetaminophen (NORCO) 7.5-325 MG tablet    Sig: Take 1 tablet by mouth every 8 (eight) hours as needed for moderate pain.    Dispense:  90 tablet    Refill:  0  . HYDROcodone-acetaminophen (NORCO) 7.5-325 MG tablet    Sig: Take 1 tablet by mouth every 8 (eight) hours as needed for moderate pain. May fill 30 days after date written    Dispense:  90 tablet    Refill:  0    I personally performed the services described in this documentation, which was scribed in my presence. The recorded information has been reviewed and considered, and addended by me as needed.   Sydney Sorenson, M.D.  [facility] [address] [place], [place] [address] [phone] phone [phone] fax  04/22/2016 9:17 AM

## 2016-04-05 NOTE — Patient Instructions (Addendum)
[facility] Policy for Prescribing Controlled Substances (Revised 08/2012) 1. Prescriptions for controlled substances will be filled by ONE provider at [facility] with whom you have established and developed a plan for your care, including follow-up. 2. You are encouraged to schedule an appointment with your prescriber at our appointment center for follow-up visits whenever possible. 3. If you request a prescription for the controlled substance while at [facility] for an acute problem (with someone other than your regular prescriber), you MAY be given a ONE-TIME prescription for a 30-day supply of the controlled substance, to allow time for you to return to see your regular prescriber for additional prescriptions.    IF you received an x-ray today, you will receive an invoice from [facility]. Please contact [facility] at [phone] with questions or concerns regarding your invoice.   IF you received labwork today, you will receive an invoice from [facility]. Please contact [facility] at [phone] with questions or concerns regarding your invoice.   Our billing staff will not be able to assist you with questions regarding bills from these companies.  You will be contacted with the lab results as soon as they are available. The fastest way to get your results is to activate your My Chart account. Instructions are located on the last page of this paperwork. If you have not heard from us regarding the results in 2 weeks, please contact this office.    Chronic Pain Chronic pain can be defined as pain that is off and on and lasts for 3-6 months or longer. Many things cause chronic pain, which can make it difficult to make a diagnosis. There are many treatment options available for chronic pain. However, finding a treatment that works well for you may require trying various approaches until the right one is found. Many people benefit from a combination of two or more types  of treatment to control their pain. SYMPTOMS  Chronic pain can occur anywhere in the body and can range from mild to very severe. Some types of chronic pain include:  Headache.  Low back pain.  Cancer pain.  Arthritis pain.  Neurogenic pain. This is pain resulting from damage to nerves. People with chronic pain may also have other symptoms such as:  Depression.  Anger.  Insomnia.  Anxiety. DIAGNOSIS  Your health care provider will help diagnose your condition over time. In many cases, the initial focus will be on excluding possible conditions that could be causing the pain. Depending on your symptoms, your health care provider may order tests to diagnose your condition. Some of these tests may include:   Blood tests.   CT scan.   MRI.   X-rays.   Ultrasounds.   Nerve conduction studies.  You may need to see a specialist.  TREATMENT  Finding treatment that works well may take time. You may be referred to a pain specialist. He or she may prescribe medicine or therapies, such as:   Mindful meditation or yoga.  Shots (injections) of numbing or pain-relieving medicines into the spine or area of pain.  Local electrical stimulation.  Acupuncture.   Massage therapy.   Aroma, color, light, or sound therapy.   Biofeedback.   Working with a physical therapist to keep from getting stiff.   Regular, gentle exercise.   Cognitive or behavioral therapy.   Group support.  Sometimes, surgery may be recommended.  HOME CARE INSTRUCTIONS   Take all medicines as directed by your health care provider.   Lessen stress  in your life by relaxing and doing things such as listening to calming music.   Exercise or be active as directed by your health care provider.   Eat a healthy diet and include things such as vegetables, fruits, fish, and lean meats in your diet.   Keep all follow-up appointments with your health care provider.   Attend a support  group with others suffering from chronic pain. SEEK MEDICAL CARE IF:   Your pain gets worse.   You develop a new pain that was not there before.   You cannot tolerate medicines given to you by your health care provider.   You have new symptoms since your last visit with your health care provider.  SEEK IMMEDIATE MEDICAL CARE IF:   You feel weak.   You have decreased sensation or numbness.   You lose control of bowel or bladder function.   Your pain suddenly gets much worse.   You develop shaking.  You develop chills.  You develop confusion.  You develop chest pain.  You develop shortness of breath.  MAKE SURE YOU:  Understand these instructions.  Will watch your condition.  Will get help right away if you are not doing well or get worse.   This information is not intended to replace advice given to you by your health care provider. Make sure you discuss any questions you have with your health care provider.   Document Released: 07/02/2002 Document Revised: 06/12/2013 Document Reviewed: 04/05/2013 Elsevier Interactive Patient Education [employer]

## 2016-04-05 NOTE — Telephone Encounter (Signed)
Left message for the new patient coordinator at [facility] requesting a status update on the patient's referral.

## 2016-05-19 ENCOUNTER — Telehealth: Payer: Self-pay

## 2016-05-19 NOTE — Telephone Encounter (Signed)
PATIENT WOULD LIKE DR. SHAW TO KNOW THAT SHE IS COMPLETELY OUT OF HER HYDROCODONE 7.5 - 325 MG. PLEASE CALL HER WHEN SHE CAN PICK THE PRESCRIPTION UP. BEST PHONE [phone] (CELL)  MBC

## 2016-05-20 NOTE — Telephone Encounter (Signed)
No refill.  The agreement was that pt was going to pain management for further narcotics and she had an appointment scheduled in August.

## 2016-05-20 NOTE — Telephone Encounter (Signed)
Left detailed message advising pt.

## 2016-05-23 ENCOUNTER — Telehealth: Payer: Self-pay

## 2016-05-23 NOTE — Telephone Encounter (Signed)
SHAW - Pt was referred to [facility] and they denied her. Can we try somewhere else?  She also wants to know if she can have a refill on her oxycodone until she gets referred.  [phone]

## 2016-05-24 NOTE — Telephone Encounter (Signed)
She is going to need to come in for a refill as she has told me that she had an appointment scheduled already.  I recommend that she call around to different pain management clinics and see who is accepting new patients, they will want her to drop off records from her prior pain management clinic before they accept her.  We can refer her to anywhere she wants.  There is a good clinic - [facility]  - outside of town (has multiple locations in [place] or [place] or multiple more in [place]) that might be good fit and more convenient to her.

## 2016-05-25 NOTE — Telephone Encounter (Signed)
L/m for patient to call back

## 2016-06-08 ENCOUNTER — Telehealth: Payer: Self-pay

## 2016-06-08 NOTE — Telephone Encounter (Signed)
She needs to come in for a visit to discuss this if she wants refills of hyrdocodone. I do not refill this over the phone.  She can google or call her insurance company - can try [facility], [facility], [facility], [facility], I'm sure there are others.

## 2016-06-08 NOTE — Telephone Encounter (Signed)
Pt states that she tried to call the pain clinic dr Clelia Croftshaw recommending but they are full and can not take her as a patient and she would like to know who next would she recommend, and can she refill her hydrocodone until she can fine a pain clinic   Best number [phone]

## 2016-06-09 NOTE — Telephone Encounter (Signed)
I have sent her referral to [facility], per Dr Alver FisherShaw's recommendation.  We can try the others at a later date if this facility does not work out.

## 2016-06-09 NOTE — Telephone Encounter (Signed)
Called pt and advised message from provider on their voicemail.   Can we check on another pain clinic?

## 2016-06-19 ENCOUNTER — Inpatient Hospital Stay (HOSPITAL_COMMUNITY)
Admission: EM | Admit: 2016-06-19 | Discharge: 2016-06-25 | DRG: 872 | Disposition: A | Discharge status: Home / Self Care | Payer: BC Managed Care – PPO | Attending: Family Medicine | Admitting: Family Medicine

## 2016-06-19 ENCOUNTER — Emergency Department (HOSPITAL_COMMUNITY): Payer: BC Managed Care – PPO

## 2016-06-19 ENCOUNTER — Encounter (HOSPITAL_COMMUNITY): Payer: Self-pay

## 2016-06-19 DIAGNOSIS — N179 Acute kidney failure, unspecified: Secondary | ICD-10-CM | POA: Diagnosis present

## 2016-06-19 DIAGNOSIS — Z8249 Family history of ischemic heart disease and other diseases of the circulatory system: Secondary | ICD-10-CM

## 2016-06-19 DIAGNOSIS — Z809 Family history of malignant neoplasm, unspecified: Secondary | ICD-10-CM | POA: Diagnosis not present

## 2016-06-19 DIAGNOSIS — L02419 Cutaneous abscess of limb, unspecified: Secondary | ICD-10-CM | POA: Diagnosis present

## 2016-06-19 DIAGNOSIS — Z9884 Bariatric surgery status: Secondary | ICD-10-CM | POA: Diagnosis not present

## 2016-06-19 DIAGNOSIS — G4700 Insomnia, unspecified: Secondary | ICD-10-CM

## 2016-06-19 DIAGNOSIS — L4050 Arthropathic psoriasis, unspecified: Secondary | ICD-10-CM | POA: Diagnosis present

## 2016-06-19 DIAGNOSIS — F1010 Alcohol abuse, uncomplicated: Secondary | ICD-10-CM | POA: Diagnosis present

## 2016-06-19 DIAGNOSIS — F329 Major depressive disorder, single episode, unspecified: Secondary | ICD-10-CM | POA: Diagnosis present

## 2016-06-19 DIAGNOSIS — L03119 Cellulitis of unspecified part of limb: Secondary | ICD-10-CM | POA: Diagnosis not present

## 2016-06-19 DIAGNOSIS — M7989 Other specified soft tissue disorders: Secondary | ICD-10-CM | POA: Diagnosis not present

## 2016-06-19 DIAGNOSIS — R509 Fever, unspecified: Secondary | ICD-10-CM | POA: Diagnosis not present

## 2016-06-19 DIAGNOSIS — M797 Fibromyalgia: Secondary | ICD-10-CM

## 2016-06-19 DIAGNOSIS — G894 Chronic pain syndrome: Secondary | ICD-10-CM | POA: Diagnosis present

## 2016-06-19 DIAGNOSIS — A419 Sepsis, unspecified organism: Principal | ICD-10-CM | POA: Diagnosis present

## 2016-06-19 DIAGNOSIS — Z87891 Personal history of nicotine dependence: Secondary | ICD-10-CM

## 2016-06-19 DIAGNOSIS — D649 Anemia, unspecified: Secondary | ICD-10-CM | POA: Diagnosis present

## 2016-06-19 DIAGNOSIS — I872 Venous insufficiency (chronic) (peripheral): Secondary | ICD-10-CM | POA: Diagnosis present

## 2016-06-19 DIAGNOSIS — Z96653 Presence of artificial knee joint, bilateral: Secondary | ICD-10-CM | POA: Diagnosis present

## 2016-06-19 DIAGNOSIS — E669 Obesity, unspecified: Secondary | ICD-10-CM | POA: Diagnosis present

## 2016-06-19 DIAGNOSIS — F411 Generalized anxiety disorder: Secondary | ICD-10-CM | POA: Diagnosis present

## 2016-06-19 DIAGNOSIS — Z981 Arthrodesis status: Secondary | ICD-10-CM

## 2016-06-19 DIAGNOSIS — E876 Hypokalemia: Secondary | ICD-10-CM | POA: Diagnosis present

## 2016-06-19 DIAGNOSIS — L03116 Cellulitis of left lower limb: Secondary | ICD-10-CM | POA: Diagnosis present

## 2016-06-19 DIAGNOSIS — Z6839 Body mass index (BMI) 39.0-39.9, adult: Secondary | ICD-10-CM | POA: Diagnosis not present

## 2016-06-19 DIAGNOSIS — M79609 Pain in unspecified limb: Secondary | ICD-10-CM | POA: Diagnosis not present

## 2016-06-19 HISTORY — DX: Fibromyalgia: M79.7

## 2016-06-19 LAB — CBC WITH DIFFERENTIAL/PLATELET
Basophils Absolute: 0 10*3/uL (ref 0.0–0.1)
Basophils Relative: 0 %
EOS PCT: 1 %
Eosinophils Absolute: 0.1 10*3/uL (ref 0.0–0.7)
HEMATOCRIT: 39.8 % (ref 36.0–46.0)
Hemoglobin: 12.9 g/dL (ref 12.0–15.0)
LYMPHS ABS: 1.4 10*3/uL (ref 0.7–4.0)
LYMPHS PCT: 13 %
MCH: 31.5 pg (ref 26.0–34.0)
MCHC: 32.4 g/dL (ref 30.0–36.0)
MCV: 97.3 fL (ref 78.0–100.0)
MONO ABS: 0.6 10*3/uL (ref 0.1–1.0)
Monocytes Relative: 6 %
NEUTROS PCT: 80 %
Neutro Abs: 8.4 10*3/uL — ABNORMAL HIGH (ref 1.7–7.7)
PLATELETS: 224 10*3/uL (ref 150–400)
RBC: 4.09 MIL/uL (ref 3.87–5.11)
RDW: 15 % (ref 11.5–15.5)
WBC: 10.6 10*3/uL — ABNORMAL HIGH (ref 4.0–10.5)

## 2016-06-19 LAB — COMPREHENSIVE METABOLIC PANEL
ALBUMIN: 4 g/dL (ref 3.5–5.0)
ALK PHOS: 75 U/L (ref 38–126)
ALT: 11 U/L — ABNORMAL LOW (ref 14–54)
ANION GAP: 10 (ref 5–15)
AST: 20 U/L (ref 15–41)
BUN: 9 mg/dL (ref 6–20)
CALCIUM: 9 mg/dL (ref 8.9–10.3)
CHLORIDE: 107 mmol/L (ref 101–111)
CO2: 21 mmol/L — AB (ref 22–32)
Creatinine, Ser: 1.2 mg/dL — ABNORMAL HIGH (ref 0.44–1.00)
GFR calc Af Amer: 57 mL/min — ABNORMAL LOW (ref >60)
GFR calc non Af Amer: 49 mL/min — ABNORMAL LOW (ref >60)
GLUCOSE: 117 mg/dL — AB (ref 65–99)
Potassium: 3.3 mmol/L — ABNORMAL LOW (ref 3.5–5.1)
SODIUM: 138 mmol/L (ref 135–145)
Total Bilirubin: 0.5 mg/dL (ref 0.3–1.2)
Total Protein: 7.1 g/dL (ref 6.5–8.1)

## 2016-06-19 LAB — URINE MICROSCOPIC-ADD ON: Squamous Epithelial / LPF: NONE SEEN

## 2016-06-19 LAB — URINALYSIS, ROUTINE W REFLEX MICROSCOPIC
GLUCOSE, UA: NEGATIVE mg/dL
Ketones, ur: NEGATIVE mg/dL
NITRITE: NEGATIVE
PH: 6 (ref 5.0–8.0)
PROTEIN: 100 mg/dL — AB
Specific Gravity, Urine: 1.025 (ref 1.005–1.030)

## 2016-06-19 LAB — RAPID URINE DRUG SCREEN, HOSP PERFORMED
AMPHETAMINES: NOT DETECTED
BENZODIAZEPINES: NOT DETECTED
Barbiturates: NOT DETECTED
Cocaine: NOT DETECTED
Opiates: POSITIVE — AB
TETRAHYDROCANNABINOL: NOT DETECTED

## 2016-06-19 LAB — I-STAT CG4 LACTIC ACID, ED: LACTIC ACID, VENOUS: 1 mmol/L (ref 0.5–1.9)

## 2016-06-19 LAB — D-DIMER, QUANTITATIVE: D-Dimer, Quant: 1.2 ug/mL-FEU — ABNORMAL HIGH (ref 0.00–0.50)

## 2016-06-19 MED ORDER — LORAZEPAM 1 MG PO TABS
1.0000 mg | ORAL_TABLET | Freq: Four times a day (QID) | ORAL | Status: AC | PRN
  Administered 2016-06-21 – 2016-06-22 (×3): 1 mg via ORAL
  Filled 2016-06-19 (×3): qty 1

## 2016-06-19 MED ORDER — LORAZEPAM 1 MG PO TABS: 0.0000 mg | ORAL_TABLET | Freq: Four times a day (QID) | ORAL | Status: DC

## 2016-06-19 MED ORDER — VITAMIN B-1 100 MG PO TABS: 100.0000 mg | ORAL_TABLET | Freq: Every day | ORAL | Status: DC

## 2016-06-19 MED ORDER — FLUOXETINE HCL 20 MG PO CAPS
40.0000 mg | ORAL_CAPSULE | Freq: Every day | ORAL | Status: DC
  Administered 2016-06-19 – 2016-06-25 (×7): 40 mg via ORAL
  Filled 2016-06-19 (×8): qty 2

## 2016-06-19 MED ORDER — DULOXETINE HCL 60 MG PO CPEP: 60.0000 mg | ORAL_CAPSULE | Freq: Every day | ORAL | Status: DC

## 2016-06-19 MED ORDER — ADULT MULTIVITAMIN W/MINERALS CH
1.0000 | ORAL_TABLET | Freq: Every day | ORAL | Status: DC
  Administered 2016-06-19 – 2016-06-25 (×7): 1 via ORAL
  Filled 2016-06-19 (×7): qty 1

## 2016-06-19 MED ORDER — PIPERACILLIN-TAZOBACTAM 3.375 G IVPB 30 MIN
3.3750 g | Freq: Once | INTRAVENOUS | Status: AC
  Administered 2016-06-19: 3.375 g via INTRAVENOUS
  Filled 2016-06-19: qty 50

## 2016-06-19 MED ORDER — LORAZEPAM 2 MG/ML IJ SOLN: 1.0000 mg | Freq: Four times a day (QID) | INTRAMUSCULAR | Status: AC | PRN

## 2016-06-19 MED ORDER — FOLIC ACID 1 MG PO TABS
1.0000 mg | ORAL_TABLET | Freq: Every day | ORAL | Status: DC
  Administered 2016-06-19 – 2016-06-25 (×7): 1 mg via ORAL
  Filled 2016-06-19 (×7): qty 1

## 2016-06-19 MED ORDER — SODIUM CHLORIDE 0.9 % IV BOLUS (SEPSIS)
1000.0000 mL | Freq: Once | INTRAVENOUS | Status: AC
  Administered 2016-06-19: 1000 mL via INTRAVENOUS

## 2016-06-19 MED ORDER — LORAZEPAM 1 MG PO TABS: 0.0000 mg | ORAL_TABLET | Freq: Two times a day (BID) | ORAL | Status: DC

## 2016-06-19 MED ORDER — LORAZEPAM 1 MG PO TABS: 1.0000 mg | ORAL_TABLET | Freq: Four times a day (QID) | ORAL | Status: DC | PRN

## 2016-06-19 MED ORDER — LORAZEPAM 2 MG/ML IJ SOLN: 1.0000 mg | Freq: Four times a day (QID) | INTRAMUSCULAR | Status: DC | PRN

## 2016-06-19 MED ORDER — ACETAMINOPHEN 500 MG PO TABS
1000.0000 mg | ORAL_TABLET | Freq: Four times a day (QID) | ORAL | Status: DC | PRN
Start: 2016-06-19 — End: 2016-06-25
  Administered 2016-06-19: 1000 mg via ORAL
  Administered 2016-06-19: 500 mg via ORAL
  Administered 2016-06-20 – 2016-06-21 (×2): 1000 mg via ORAL
  Filled 2016-06-19 (×5): qty 2

## 2016-06-19 MED ORDER — VANCOMYCIN HCL 10 G IV SOLR
1250.0000 mg | INTRAVENOUS | Status: DC
  Filled 2016-06-19: qty 1250

## 2016-06-19 MED ORDER — METOCLOPRAMIDE HCL 5 MG/ML IJ SOLN
10.0000 mg | Freq: Once | INTRAMUSCULAR | Status: AC
  Administered 2016-06-19: 10 mg via INTRAVENOUS
  Filled 2016-06-19: qty 2

## 2016-06-19 MED ORDER — PIPERACILLIN-TAZOBACTAM 3.375 G IVPB: 3.3750 g | Freq: Three times a day (TID) | INTRAVENOUS | Status: DC

## 2016-06-19 MED ORDER — DULOXETINE HCL 60 MG PO CPEP
60.0000 mg | ORAL_CAPSULE | Freq: Every day | ORAL | Status: DC
  Administered 2016-06-20 – 2016-06-25 (×6): 60 mg via ORAL
  Filled 2016-06-19 (×6): qty 1

## 2016-06-19 MED ORDER — THIAMINE HCL 100 MG/ML IJ SOLN: 100.0000 mg | Freq: Every day | INTRAMUSCULAR | Status: DC

## 2016-06-19 MED ORDER — ENOXAPARIN SODIUM 40 MG/0.4ML ~~LOC~~ SOLN
40.0000 mg | SUBCUTANEOUS | Status: DC
  Administered 2016-06-19: 40 mg via SUBCUTANEOUS
  Filled 2016-06-19: qty 0.4

## 2016-06-19 MED ORDER — POTASSIUM CHLORIDE CRYS ER 20 MEQ PO TBCR
40.0000 meq | EXTENDED_RELEASE_TABLET | Freq: Once | ORAL | Status: AC
  Administered 2016-06-19: 40 meq via ORAL
  Filled 2016-06-19: qty 2

## 2016-06-19 MED ORDER — VANCOMYCIN HCL IN DEXTROSE 1-5 GM/200ML-% IV SOLN
1000.0000 mg | Freq: Once | INTRAVENOUS | Status: AC
  Administered 2016-06-19: 1000 mg via INTRAVENOUS
  Filled 2016-06-19: qty 200

## 2016-06-19 MED ORDER — VANCOMYCIN HCL 10 G IV SOLR
1250.0000 mg | INTRAVENOUS | Status: DC
  Administered 2016-06-19: 1250 mg via INTRAVENOUS
  Filled 2016-06-19 (×2): qty 1250

## 2016-06-19 MED ORDER — FOLIC ACID 1 MG PO TABS: 1.0000 mg | ORAL_TABLET | Freq: Every day | ORAL | Status: DC

## 2016-06-19 MED ORDER — MORPHINE SULFATE (PF) 4 MG/ML IV SOLN
4.0000 mg | Freq: Once | INTRAVENOUS | Status: AC
  Administered 2016-06-19: 4 mg via INTRAVENOUS
  Filled 2016-06-19: qty 1

## 2016-06-19 MED ORDER — PIPERACILLIN-TAZOBACTAM 3.375 G IVPB
3.3750 g | Freq: Three times a day (TID) | INTRAVENOUS | Status: DC
  Administered 2016-06-19 – 2016-06-21 (×5): 3.375 g via INTRAVENOUS
  Filled 2016-06-19 (×7): qty 50

## 2016-06-19 MED ORDER — TRAZODONE HCL 50 MG PO TABS
50.0000 mg | ORAL_TABLET | Freq: Every day | ORAL | Status: DC
  Administered 2016-06-19 – 2016-06-24 (×6): 50 mg via ORAL
  Filled 2016-06-19 (×6): qty 1

## 2016-06-19 MED ORDER — PNEUMOCOCCAL VAC POLYVALENT 25 MCG/0.5ML IJ INJ
0.5000 mL | INJECTION | INTRAMUSCULAR | Status: DC
  Filled 2016-06-19: qty 0.5

## 2016-06-19 MED ORDER — ADULT MULTIVITAMIN W/MINERALS CH
1.0000 | ORAL_TABLET | Freq: Every day | ORAL | Status: DC
Start: 2016-06-19 — End: 2016-06-19

## 2016-06-19 MED ORDER — VITAMIN B-1 100 MG PO TABS
100.0000 mg | ORAL_TABLET | Freq: Every day | ORAL | Status: DC
  Administered 2016-06-19 – 2016-06-25 (×7): 100 mg via ORAL
  Filled 2016-06-19 (×7): qty 1

## 2016-06-19 MED ORDER — SODIUM CHLORIDE 0.9 % IV SOLN
INTRAVENOUS | Status: DC
  Administered 2016-06-19 – 2016-06-21 (×5): via INTRAVENOUS

## 2016-06-19 MED ORDER — HYDROCODONE-ACETAMINOPHEN 5-325 MG PO TABS
1.0000 | ORAL_TABLET | Freq: Four times a day (QID) | ORAL | Status: DC | PRN
  Administered 2016-06-19 – 2016-06-20 (×2): 1 via ORAL
  Filled 2016-06-19 (×2): qty 1

## 2016-06-19 NOTE — ED Provider Notes (Signed)
[facility]-EMERGENCY DEPT Provider Note   CSN: [id number] Arrival date & time: 06/19/16  0830     History   Chief Complaint Chief Complaint  Patient presents with  . Fever    HPI [name] is a 60 y.o. female.  HPI Complains of left leg pain and swelling onset 3 days ago accompanied by fever with maximum temperature 104 last night. She treated herself with Tylenol 4:30 AM today. Other associated symptoms include nausea and vomiting. Last vomited 2 days ago. Also complains of diffuse throbbing headache and mild dyspnea. Nothing makes symptoms better or worse. No other associated symptoms. Past Medical History:  Diagnosis Date  . Anemia   . Arthritis   . Depression   . MRSA (methicillin resistant Staphylococcus aureus)   . Obese   . Seasonal allergies   . Sleep apnea 2012   took test in [place]-told to use cpap-could not ude    Patient Active Problem List   Diagnosis Date Noted  . 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 Psoriatic arthritis (HCC)   . Cough   . Wheezing   . Depression 12/05/2015  . Chronic anemia 12/05/2015  . Chronic venous insufficiency 12/04/2015  . Alcohol abuse 11/21/2014  . Anxiety state 11/21/2014  . BMI 40.0-44.9, adult (HCC) 11/21/2014  . Epistaxis 09/24/2014    Past Surgical History:  Procedure Laterality Date  . ANTERIOR CERVICAL DECOMP/DISCECTOMY FUSION  05/09/2008   C5-6, C6-7; with arthrodesis also  . GASTRIC BYPASS  2002  . KNEE ARTHROSCOPY  2004   rt and lt knee  . ROTATOR CUFF REPAIR W/ DISTAL CLAVICLE EXCISION  01/06/2006   right  . SHOULDER ARTHROSCOPY  2003   rt  . SHOULDER ARTHROSCOPY  2003   rt  . TONSILLECTOMY    . TOTAL KNEE ARTHROPLASTY  03/29/2004   left  . TOTAL KNEE ARTHROPLASTY  11/07/2003   right  . TOTAL KNEE REVISION  07/30/2010   right; with lateral release    OB History    Gravida Para Term Preterm AB Living   0 0 0 0 0 0   SAB TAB Ectopic Multiple Live Births   0 0 0 0         Home Medications    Prior to  Admission medications   Medication Sig Start Date End Date Taking? Authorizing Provider  amphetamine-dextroamphetamine (ADDERALL XR) 30 MG 24 hr capsule Take 30 mg by mouth 2 (two) times daily. Reported on 12/03/2015 03/06/15   Historical Provider, MD  DULoxetine (CYMBALTA) 30 MG capsule Take 2 capsules by mouth daily. 02/12/15   Historical Provider, MD  EPINEPHrine 0.3 mg/0.3 mL IJ SOAJ injection Inject 0.3 mLs (0.3 mg total) into the muscle once. Patient taking differently: Inject 0.3 mg into the muscle daily as needed (allergic reaction).  05/07/15   Lorre Nick, MD  FLUoxetine (PROZAC) 20 MG capsule Take 40 mg by mouth 2 (two) times daily.     Historical Provider, MD  HYDROcodone-acetaminophen (NORCO) 7.5-325 MG tablet Take 1 tablet by mouth every 8 (eight) hours as needed for moderate pain. May fill 30 days after date written 04/05/16   Sherren Mocha, MD  mupirocin ointment (BACTROBAN) 2 % Apply 1 application topically 3 (three) times daily. 01/05/16   Sherren Mocha, MD  traZODone (DESYREL) 50 MG tablet TAKE 50 MG BY MOUTH DAILY AT BEDTIME AS NEEDED FOR SLEEP 12/11/14   Historical Provider, MD  tretinoin (RETIN-A) 0.025 % cream Apply topically at bedtime. 01/05/16   Carley Hammed  Romie Levee, MD   Patient only taking Tylenol presently. She is not taking any other medications Family History Family History  Problem Relation Age of Onset  . Heart disease Mother   . Cancer Father     Social History Social History  Substance Use Topics  . Smoking status: Former Smoker    Quit date: 05/16/1981  . Smokeless tobacco: Never Used  . Alcohol use 3.0 oz/week    5 Cans of beer per week   Drinks 8 cans of beer per day No illicit drug use Allergies   Review of patient's allergies indicates no known allergies.   Review of Systems Review of Systems  Constitutional: Positive for fever.  HENT: Negative.   Respiratory: Positive for shortness of breath.        Chronic dyspnea  Cardiovascular: Negative.     Gastrointestinal: Positive for nausea and vomiting.  Musculoskeletal: Positive for myalgias.       Left leg pain  Skin: Positive for rash.       Left leg red   Allergic/Immunologic: Positive for immunocompromised state.       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   Psychiatric/Behavioral: Negative.   All other systems reviewed and are negative.    Physical Exam Updated Vital Signs BP 137/73 (BP Location: Right Arm)   Pulse 85   Temp (S) 99.7 F (37.6 C) (Oral)   Resp 22   Ht 5\' 8"  (1.727 m)   Wt 260 lb (117.9 kg)   SpO2 97%   BMI 39.53 kg/m   Physical Exam  Constitutional: She is oriented to person, place, and time.  Nontoxic alert. Chronically ill-appearing  HENT:  Head: Normocephalic and atraumatic.  Eyes: Conjunctivae are normal. Pupils are equal, round, and reactive to light.  Neck: Neck supple. No tracheal deviation present. No thyromegaly present.  Cardiovascular: Normal rate and regular rhythm.   No murmur heard. Pulmonary/Chest: Effort normal and breath sounds normal.  Abdominal: Soft. Bowel sounds are normal. She exhibits no distension. There is no tenderness.  Morbidly obese  Musculoskeletal: Normal range of motion. She exhibits no edema or tenderness.  Left lower extremity with a dime-sized lesion weeping yellowish yellowish material at lateral aspect of calf. Calf is diffusely tender red and warm. No red streaks up the leg. DP pulses 2+ bilaterally  Neurological: She is alert and oriented to person, place, and time. No cranial nerve deficit. Coordination normal.  Skin: Skin is warm and dry. Capillary refill takes less than 2 seconds. Rash noted.  Left leg red  otherwise without rash  Psychiatric: She has a normal mood and affect.  Nursing note and vitals reviewed.  Chest x-ray viewed by me Results for orders placed or performed during the hospital encounter of 06/19/16  Comprehensive metabolic panel  Result Value Ref  Range   Sodium 138 135 - 145 mmol/L   Potassium 3.3 (L) 3.5 - 5.1 mmol/L   Chloride 107 101 - 111 mmol/L   CO2 21 (L) 22 - 32 mmol/L   Glucose, Bld 117 (H) 65 - 99 mg/dL   BUN 9 6 - 20 mg/dL   Creatinine, Ser 9.62 (H) 0.44 - 1.00 mg/dL   Calcium 9.0 8.9 - 95.2 mg/dL   Total Protein 7.1 6.5 - 8.1 g/dL   Albumin 4.0 3.5 - 5.0 g/dL   AST 20 15 - 41 U/L   ALT 11 (L) 14 - 54 U/L   Alkaline Phosphatase 75 38 - 126 U/L  Total Bilirubin 0.5 0.3 - 1.2 mg/dL   GFR calc non Af Amer 49 (L) >60 mL/min   GFR calc Af Amer 57 (L) >60 mL/min   Anion gap 10 5 - 15  CBC WITH DIFFERENTIAL  Result Value Ref Range   WBC 10.6 (H) 4.0 - 10.5 K/uL   RBC 4.09 3.87 - 5.11 MIL/uL   Hemoglobin 12.9 12.0 - 15.0 g/dL   HCT 16.139.8 09.636.0 - 04.546.0 %   MCV 97.3 78.0 - 100.0 fL   MCH 31.5 26.0 - 34.0 pg   MCHC 32.4 30.0 - 36.0 g/dL   RDW 40.915.0 81.111.5 - 91.415.5 %   Platelets 224 150 - 400 K/uL   Neutrophils Relative % 80 %   Neutro Abs 8.4 (H) 1.7 - 7.7 K/uL   Lymphocytes Relative 13 %   Lymphs Abs 1.4 0.7 - 4.0 K/uL   Monocytes Relative 6 %   Monocytes Absolute 0.6 0.1 - 1.0 K/uL   Eosinophils Relative 1 %   Eosinophils Absolute 0.1 0.0 - 0.7 K/uL   Basophils Relative 0 %   Basophils Absolute 0.0 0.0 - 0.1 K/uL  D-dimer, quantitative (not at [facility])  Result Value Ref Range   D-Dimer, Quant 1.20 (H) 0.00 - 0.50 ug/mL-FEU  I-Stat CG4 Lactic Acid, ED  (not at  [facility])  Result Value Ref Range   Lactic Acid, Venous 1.00 0.5 - 1.9 mmol/L   Dg Chest Port 1 View  Result Date: 06/19/2016 CLINICAL DATA:  Labored breathing and seizure. EXAM: PORTABLE CHEST 1 VIEW COMPARISON:  12/09/2015 FINDINGS: The heart size and mediastinal contours are within normal limits. Both lungs are clear. The visualized skeletal structures are unremarkable. IMPRESSION: No active disease. Electronically Signed   By: Signa Kellaylor  Stroud M.D.   On: 06/19/2016 09:46   ED Treatments / Results  Labs (all labs ordered are listed, but only abnormal results are  displayed) Labs Reviewed  CULTURE, BLOOD (ROUTINE X 2)  CULTURE, BLOOD (ROUTINE X 2)  URINE CULTURE  COMPREHENSIVE METABOLIC PANEL  CBC WITH DIFFERENTIAL/PLATELET  URINALYSIS, ROUTINE W REFLEX MICROSCOPIC (NOT AT [facility])  D-DIMER, QUANTITATIVE (NOT AT [facility])  I-STAT CG4 LACTIC ACID, ED    EKG  EKG Interpretation None       Radiology No results found.  Procedures Procedures (including critical care time)  Medications Ordered in ED Medications  piperacillin-tazobactam (ZOSYN) IVPB 3.375 g (not administered)  vancomycin (VANCOCIN) IVPB 1000 mg/200 mL premix (not administered)  morphine 4 MG/ML injection 4 mg (not administered)  metoCLOPramide (REGLAN) injection 10 mg (not administered)     Initial Impression / Assessment and Plan / ED Course  I have reviewed the triage vital signs and the nursing notes.  Pertinent labs & imaging results that were available during my care of the patient were reviewed by me and considered in my medical decision making (see chart for details).  Clinical Course   Chest x-ray viewed by me. 11 AM pain improved after treatment with intravenous opioids Code sepsis called based on Sirs criteria of fever, tachypnea, likely source of infection cellulitis left leg Strongly suspect sepsis in light of fever. D-dimer positive. Noninvasive study of left leg ordered to check for DVT, though doubtful. Dr. Kennon RoundsHaney from family medicine service consulted. Plan admit intravenous antibiotics. She will check noninvasive study of left leg. Final Clinical Impressions(s) / ED Diagnoses  Patient will be admitted to telemetry Final diagnoses:  None   Diagnosis #1 sepsis #2 cellulitis of left leg #3 hypokalemia CRITICAL  CARE Performed by: Doug Sou Total critical care time: 30 minutes Critical care time was exclusive of separately billable procedures and treating other patients. Critical care was necessary to treat or prevent imminent or life-threatening  deterioration. Critical care was time spent personally by me on the following activities: development of treatment plan with patient and/or surrogate as well as nursing, discussions with consultants, evaluation of patient's response to treatment, examination of patient, obtaining history from patient or surrogate, ordering and performing treatments and interventions, ordering and review of laboratory studies, ordering and review of radiographic studies, pulse oximetry and re-evaluation of patient's condition. New Prescriptions New Prescriptions   No medications on file     Doug Sou, MD 06/19/16 1101

## 2016-06-19 NOTE — Progress Notes (Signed)
Pharmacy Antibiotic Note  [name] is a 60 y.o. female admitted on 06/19/2016 with sepsis.  Pharmacy has been consulted for Vancomycin and Zosyn dosing.  59yo morbidly obese female L-leg pain/swelling and Tm 104 last PM along with N/V/headache and meeting SIRS criteria for sepsis with probable source LLE cellulitis, to start Vancomycin and Zosyn.  Normalized CrCl ~7250ml/min  Vancomycin 1000mg  IV x1 is currently infusing and pt has also received Zosyn 3.375g IV x 1  Plan: Continue Zosyn 3.375g IV q8, infuse over 4hr. Continue Vancomycin 1250 mg IV q24, start at 2300 Watch renal function F/U culture data Vanc trough at steady state  Height: 5\' 8"  (172.7 cm) Weight: 260 lb (117.9 kg) IBW/kg (Calculated) : 63.9  Temp (24hrs), Avg:98.5 F (36.9 C), Min:97.3 F (36.3 C), Max:99.7 F (37.6 C)   Recent Labs Lab 06/19/16 0937 06/19/16 1003  WBC 10.6*  --   CREATININE 1.20*  --   LATICACIDVEN  --  1.00    Estimated Creatinine Clearance: 69 mL/min (by C-G formula based on SCr of 1.2 mg/dL).    No Known Allergies  Antimicrobials this admission: Vanc 8/27 >>  Zosyn 8/27 >>   Dose adjustments this admission:   Microbiology results: 8/27 BCx:  8/27 UCx:     Thank you for allowing pharmacy to be a part of this patient's care.   Marisue HumbleKendra Taeya Theall, PharmD Clinical Pharmacist [facility]

## 2016-06-19 NOTE — ED Triage Notes (Signed)
Pt c/o n/v and fever. Pt reports that left leg has become painful and swollen. Left leg is red, swollen, and painful to touch. Pt reports fever reached 104.0  Yesterday. Pt does have some shortness of breath when talking.

## 2016-06-19 NOTE — H&P (Signed)
Family Medicine Teaching [facility] Admission History and Physical Service Pager: [phone]  Patient name: [name] Frizell Medical record number: [id number] Date of birth: 02-Jun-1956 Age: 60 y.o. Gender: female  Primary Care Provider: Norberto SorensonSHAW,EVA, MD Consultants: None Code Status: Full  Chief Complaint: Fever, left lower extremity erythema  Assessment and Plan: [name] Mazon is a 60 y.o. female presenting with Fever, left lower extremity erythema . PMH is significant for obesity, chronic venous stasis bilaterally, chronic pain syndrome, ETOH abuse  Left LE cellulitis vs DVT- WBC 10.6, lactic acid 1.0, temp 99.7 in the ED. Patient has history of multiple admission for LE cellulitis. However, given she presents with  Reported SOB in setting of LE pain, there was concern for DVT/PE, D dimer as collected in the ED was was 1.20. Wells score 3-4.5 for question of immobility for approx 3 days. No SOB when  interviewed by this examiner with normal O2 sats, EKG without evidence of heart strain, PE less likely - admit to family medicine teaching service, Dr Pollie MeyerMcIntyre attending - continue vanc/zosyn - follow BCx, Ucx - follow cellulitis margins - LE dopplers to r/o DVT - supp O2 PRN - wound consult for LE weeping  AKI- Cr 1.2, baseline appears 0.74-1.0. Likely due to reduced PO intake - maintenance IVF - follow renal function  Venous insufficiency- previously followed by a vein specialist - consider compression dressing, if no DVT - wound consult for recs as above  Chronic pain syndrome-  Received percoet 5-325 for chronic pain. Per chart review her PCP has been trying to have her go to a pain clinic. Narcotic database reviewed. Percecet last dispensed 05/31/2016 #16 - will continue percocet here as she does have a reason for pain in setting of active infection  ETOH abuse- Last reported drink on Thursday 8/24. No evidence of withdrawal - CIWA  Hypokalemia- K 3.3 - replete kdur 40 meq -  follow BMP in AM  Depression- reports taking both cymbalta and fluoxetine at home, however only fluoxetine on her med list. [facility], per care everywhere.  She states she has not taken either for the last 10 days. Per [facility] care every where notes she is also tacking adderall. Unclear when her last dose was - continue home Prozac  - trazodone qHS  - hold adderall at this time  FEN/GI: regular diet, NS 125 cc/hr Prophylaxis: lovenox  Disposition: pending clinical improvement  History of Present Illness:  [name] is a 60 y.o. female presenting with left LE pain erythema for one week. She reports feeling unwell since Friday 8/25, 2 days ago, when she developed a headache that required her to lay in bed for much of Friday and Saturday. She then develop fever to Tmax 104 x2 on Saturday. She additionally has nausea and emesis x2 on Saturday. Her leg became increasingly painful. She feels her leg pain made it difficult to breath. She feels she has not been able to eat or drink normally for the last 2 days. She had only one hot dog and reduced liquid PO during this time frame. She denies chest pain and pain with inspiration. She then came to the ED this AM for continued ill feeling In the ED she was noted to have a mildly elevated temp and given concern for sepsis from cellulitis vanc/zosyn were started.   Review Of Systems: Per HPI with the following additions: denies abdominal pain, back pain, chest pain, shortness of breath, current nausea, emesis, diarrhea  Otherwise the remainder of the systems were negative.  Patient Active Problem List   Diagnosis Date Noted  . Cellulitis of left leg 06/19/2016  . 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 Psoriatic arthritis (HCC)   . Cough   . Wheezing   . Depression 12/05/2015  . Chronic anemia 12/05/2015  . Chronic venous insufficiency 12/04/2015  . Alcohol abuse 11/21/2014  . Anxiety state 11/21/2014  . BMI 40.0-44.9, adult (HCC)  11/21/2014  . Epistaxis 09/24/2014    Past Medical History: Past Medical History:  Diagnosis Date  . Anemia   . Arthritis   . Depression   . Fibromyalgia   . MRSA (methicillin resistant Staphylococcus aureus)   . Obese   . Seasonal allergies   . Sleep apnea 2012   took test in [place]-told to use cpap-could not ude    Past Surgical History: Past Surgical History:  Procedure Laterality Date  . ANTERIOR CERVICAL DECOMP/DISCECTOMY FUSION  05/09/2008   C5-6, C6-7; with arthrodesis also  . GASTRIC BYPASS  2002  . KNEE ARTHROSCOPY  2004   rt and lt knee  . ROTATOR CUFF REPAIR W/ DISTAL CLAVICLE EXCISION  01/06/2006   right  . SHOULDER ARTHROSCOPY  2003   rt  . SHOULDER ARTHROSCOPY  2003   rt  . TONSILLECTOMY    . TOTAL KNEE ARTHROPLASTY  03/29/2004   left  . TOTAL KNEE ARTHROPLASTY  11/07/2003   right  . TOTAL KNEE REVISION  07/30/2010   right; with lateral release    Social History: Social History  Substance Use Topics  . Smoking status: Former Smoker    Quit date: 05/16/1981  . Smokeless tobacco: Never Used  . Alcohol use 3.6 oz/week    6 Cans of beer per week     Comment: 6 cans per day/everyday   Additional social history: denies drug use Please also refer to relevant sections of EMR.  Family History: Family History  Problem Relation Age of Onset  . Heart disease Mother   . Cancer Father   . Heart disease Maternal Grandfather   . Alcoholism Maternal Grandfather     Allergies and Medications: No Known Allergies No current facility-administered medications on file prior to encounter.    Current Outpatient Prescriptions on File Prior to Encounter  Medication Sig Dispense Refill  . FLUoxetine (PROZAC) 20 MG capsule Take 40 mg by mouth daily.     . traZODone (DESYREL) 50 MG tablet TAKE 50 MG BY MOUTH DAILY AT BEDTIME AS NEEDED FOR SLEEP    . EPINEPHrine 0.3 mg/0.3 mL IJ SOAJ injection Inject 0.3 mLs (0.3 mg total) into the muscle once. (Patient taking  differently: Inject 0.3 mg into the muscle daily as needed (allergic reaction). ) 1 Device 2  . HYDROcodone-acetaminophen (NORCO) 7.5-325 MG tablet Take 1 tablet by mouth every 8 (eight) hours as needed for moderate pain. May fill 30 days after date written (Patient not taking: Reported on 06/19/2016) 90 tablet 0  . mupirocin ointment (BACTROBAN) 2 % Apply 1 application topically 3 (three) times daily. (Patient not taking: Reported on 06/19/2016) 30 g 1  . tretinoin (RETIN-A) 0.025 % cream Apply topically at bedtime. (Patient not taking: Reported on 06/19/2016) 45 g 1    Objective: BP 132/66 (BP Location: Right Arm)   Pulse 88   Temp 99.6 F (37.6 C) (Oral)   Resp 20   Ht 5\' 8"  (1.727 m)   Wt 260 lb (117.9 kg)   SpO2 100%   BMI 39.53 kg/m  Exam: General: NAD HEENT: EOMI, PERRL Cardiovascular: RRR, no murmurs auscultated Respiratory: CTAB Abdomen: soft, non tender, + BS Skin: left LE erythema with borders marked, tender to palpation over erythematous areas, 1 cm area od weeping on left lateral aspect of calf, margins traced Neuro: AOx3 Psych: normal mood and affect  Labs and Imaging: CBC BMET   Recent Labs Lab 06/19/16 0937  WBC 10.6*  HGB 12.9  HCT 39.8  PLT 224    Recent Labs Lab 06/19/16 0937  NA 138  K 3.3*  CL 107  CO2 21*  BUN 9  CREATININE 1.20*  GLUCOSE 117*  CALCIUM 9.0     CXR No active disease.  Bonney Aid, MD 06/19/2016, 1:19 PM PGY-3, [facility] FPTS Intern pager: [phone], text pages welcome

## 2016-06-20 ENCOUNTER — Inpatient Hospital Stay (HOSPITAL_COMMUNITY): Payer: BC Managed Care – PPO

## 2016-06-20 DIAGNOSIS — G894 Chronic pain syndrome: Secondary | ICD-10-CM

## 2016-06-20 DIAGNOSIS — L03116 Cellulitis of left lower limb: Secondary | ICD-10-CM

## 2016-06-20 DIAGNOSIS — M7989 Other specified soft tissue disorders: Secondary | ICD-10-CM

## 2016-06-20 DIAGNOSIS — M79609 Pain in unspecified limb: Secondary | ICD-10-CM

## 2016-06-20 DIAGNOSIS — I872 Venous insufficiency (chronic) (peripheral): Secondary | ICD-10-CM

## 2016-06-20 LAB — CBC
HCT: 33.6 % — ABNORMAL LOW (ref 36.0–46.0)
HEMOGLOBIN: 10.8 g/dL — AB (ref 12.0–15.0)
MCH: 30.9 pg (ref 26.0–34.0)
MCHC: 32.1 g/dL (ref 30.0–36.0)
MCV: 96.3 fL (ref 78.0–100.0)
Platelets: 208 10*3/uL (ref 150–400)
RBC: 3.49 MIL/uL — AB (ref 3.87–5.11)
RDW: 15.3 % (ref 11.5–15.5)
WBC: 10.1 10*3/uL (ref 4.0–10.5)

## 2016-06-20 LAB — BASIC METABOLIC PANEL
Anion gap: 7 (ref 5–15)
BUN: 6 mg/dL (ref 6–20)
CO2: 20 mmol/L — ABNORMAL LOW (ref 22–32)
Calcium: 8.3 mg/dL — ABNORMAL LOW (ref 8.9–10.3)
Chloride: 110 mmol/L (ref 101–111)
Creatinine, Ser: 1.02 mg/dL — ABNORMAL HIGH (ref 0.44–1.00)
GFR calc Af Amer: >60 mL/min (ref >60)
GFR calc non Af Amer: 59 mL/min — ABNORMAL LOW (ref >60)
GLUCOSE: 151 mg/dL — AB (ref 65–99)
Potassium: 3.6 mmol/L (ref 3.5–5.1)
SODIUM: 137 mmol/L (ref 135–145)

## 2016-06-20 MED ORDER — ENOXAPARIN SODIUM 60 MG/0.6ML ~~LOC~~ SOLN
0.5000 mg/kg | SUBCUTANEOUS | Status: DC
  Administered 2016-06-20 – 2016-06-24 (×5): 60 mg via SUBCUTANEOUS
  Filled 2016-06-20 (×5): qty 0.6

## 2016-06-20 MED ORDER — HYDROCODONE-ACETAMINOPHEN 5-325 MG PO TABS
1.0000 | ORAL_TABLET | Freq: Four times a day (QID) | ORAL | Status: DC | PRN
  Administered 2016-06-20 – 2016-06-25 (×12): 2 via ORAL
  Filled 2016-06-20 (×2): qty 2
  Filled 2016-06-20: qty 1
  Filled 2016-06-20: qty 2
  Filled 2016-06-20: qty 1
  Filled 2016-06-20 (×8): qty 2

## 2016-06-20 MED ORDER — POVIDONE-IODINE 10 % EX SOLN
CUTANEOUS | Status: DC | PRN
  Filled 2016-06-20: qty 118

## 2016-06-20 MED ORDER — VANCOMYCIN HCL IN DEXTROSE 750-5 MG/150ML-% IV SOLN
750.0000 mg | Freq: Two times a day (BID) | INTRAVENOUS | Status: DC
  Administered 2016-06-20 – 2016-06-21 (×2): 750 mg via INTRAVENOUS
  Filled 2016-06-20 (×3): qty 150

## 2016-06-20 MED ORDER — POTASSIUM CHLORIDE CRYS ER 20 MEQ PO TBCR
40.0000 meq | EXTENDED_RELEASE_TABLET | Freq: Once | ORAL | Status: AC
  Administered 2016-06-20: 40 meq via ORAL
  Filled 2016-06-20: qty 2

## 2016-06-20 NOTE — Progress Notes (Signed)
VASCULAR LAB PRELIMINARY  PRELIMINARY  PRELIMINARY  PRELIMINARY  Left lower extremity venous duplex completed.    Preliminary report:  Left:  No evidence of DVT, superficial thrombosis, or Baker's cyst.  [name], RVS 06/20/2016, 10:53 AM

## 2016-06-20 NOTE — Progress Notes (Signed)
Family Medicine Teaching Service Daily Progress Note Intern Pager: [phone]  Patient name: Sydney HoffFonda T Berry Medical record number: [id number] Date of birth: 1956/04/25 Age: 60 y.o. Gender: female  Primary Care Provider: Norberto SorensonSHAW,EVA, MD Consultants: None Code Status: FULL  Pt Overview and Major Events to Date:  Admit 8/27  Assessment and Plan:  Sydney HoffFonda T Warman is a 60 y.o. female presenting with fever, left lower extremity erythema . PMH is significant for obesity, chronic venous stasis bilaterally, chronic pain syndrome, ETOH abuse.  Left LE cellulitis vs DVT- WBC 10.6, lactic acid 1.0, temp 99.7 in the ED. Patient has history of multiple admission for LE cellulitis. However, given she presents with  Reported SOB in setting of LE pain, there was concern for DVT/PE, D dimer as collected in the ED was elevated 1.20. Wells score 3-4.5 for question of immobility for approx 3 days. No SOB on exam, normal O2 sats, EKG without evidence of heart strain, PE less likely. Has continued to be febrile.  - continue vanc/zosyn - follow BCx, Ucx - follow cellulitis margins - LE dopplers to r/o DVT pending - supp O2 PRN -Wound care recs: Likely superficial and non-weeping. Betadine bid to the affected area.  -plan to transition to oral abx tomorrow  AKI- Cr 1.02 improving from 1.20 at admission, baseline appears 0.74-1.0. Likely due to reduced PO intake - maintenance IVF - follow renal function  Venous insufficiency- previously followed by a vein specialist. Patient advised to wear compression stockings but refuses to wear them due to being too hot and uncomfortable. - wound consult , appreciate recs  Chronic pain syndrome-  Received percocet 5-325 for chronic pain. Per chart review her PCP has been trying to have her go to a pain clinic. Narcotic database reviewed. Percecet last dispensed 05/31/2016 #16 - will continue percocet here as she does have a reason for pain in setting of active  infection  ETOH abuse-  6-8 beers daily. Last reported drink on Thursday 8/24. No evidence of withdrawal - CIWA -wants resources/pamphlets to help quit, no SW involvement  Hypokalemia- K 3.3 , kdur, repeat this am 3.6.  - replete kdur 40 - follow BMP in AM  Depression- reports taking both cymbalta and fluoxetine at home, however only fluoxetine on her med list. [facility], per care everywhere.  She states she has not taken either for the last 10 days. Per [facility] care every where notes she is also tacking adderall. Unclear when her last dose was. Mood this am pleasant and appropriate. - continue home Prozac  - trazodone qHS  - hold adderall at this time -assess mood  FEN/GI: regular diet, NS 125 cc/hr Prophylaxis: lovenox  Disposition: pending clinical improvement  Subjective:  Patient complaining of leg pain and fevers. No other complaints at current time.  Objective: Temp:  [98 F (36.7 C)-102.5 F (39.2 C)] 98.6 F (37 C) (08/28 1600) Pulse Rate:  [85-92] 88 (08/28 1600) Resp:  [17-20] 17 (08/28 1600) BP: (129-137)/(45-71) 129/70 (08/28 1600) SpO2:  [96 %-98 %] 96 % (08/28 1600) Physical Exam: General: NAD HEENT: EOMI, PERRL Cardiovascular: RRR, no murmurs auscultated Respiratory: CTAB Abdomen: soft, non tender, + BS Skin: left LE erythema with borders marked, tender to palpation over erythematous areas, 1 cm area ulcer on left lateral aspect of calf, margins traced Neuro: AOx3 Psych: normal mood and affect  Laboratory:  Recent Labs Lab 06/19/16 0937 06/20/16 0513  WBC 10.6* 10.1  HGB 12.9 10.8*  HCT 39.8  33.6*  PLT 224 208    Recent Labs Lab 06/19/16 0937 06/20/16 0513  NA 138 137  K 3.3* 3.6  CL 107 110  CO2 21* 20*  BUN 9 6  CREATININE 1.20* 1.02*  CALCIUM 9.0 8.3*  PROT 7.1  --   BILITOT 0.5  --   ALKPHOS 75  --   ALT 11*  --   AST 20  --   GLUCOSE 117* 151*   Imaging/Diagnostic Tests: No results  found.  Freddrick March, MD 06/20/2016, 5:21 PM PGY-1, [facility] FPTS Intern pager: [phone], text pages welcome

## 2016-06-20 NOTE — Consult Note (Signed)
WOC Nurse wound consult note Reason for Consult: Cellulitis and weeping wound of left lower extremity  Wound type: Traumatic (result of abrasion to leg prior to hospitalization)  Measurement: 1 cm x 1.3 cm Wound bed: 100% think brown  Drainage (amount, consistency, odor) : zero drainage or odor.  Periwound: Erythematous from prior use of adhesive bandage before hospitalization.   Dressing procedure/placement/frequency:  Apply betadine to area twice daily.  Cover with gauze, secure in place with paper tape.  Maintain a stable "scab" with use of betadine.    Discussed POC with patient and bedside nurse.  Re consult if needed, will not follow at this time.  Thanks Helmut MusterSherry Braelin Costlow MSN, RN, CNS-BC, [employer]

## 2016-06-20 NOTE — Progress Notes (Signed)
Pharmacy Antibiotic Note  Sydney HoffFonda T Berry is a 60 y.o. female admitted on 06/19/2016 with sepsis.  Pharmacy has been consulted on 8/27 for Vancomycin and Zosyn dosing for LLE cellulitis.  Dopplers LLE negative for DVT. WBC 10.1 , afeb, Tm 102.5 AKI- SCr improved 1.2 > 1.02,  Normalized CrCl ~70-4380ml/min  Lovenox for VTE prophylaxis- ordered as pharmacy may adjust Moridly obese female. Wt 117.9kg, Ht 69 inches, BMI = 39.  SCr improved as noted above. Hgb 10.8, pltc 208K. No bleeding noted. - Will adjust lovenox dose to 0.5mg /kg q24h    Plan: Adjust Vancomycin dose to 750 mg IV q12h due to improved renal function - start dose tonight at 21:00 Continue Zosyn 3.375g IV q8, infuse over 4hr. Watch renal function, F/U culture data. anc trough at steady state Adjust lovenox dose to 0.5mg /kg = 60 mg SQ q24h     Height: 5\' 8"  (172.7 cm) Weight: 260 lb (117.9 kg) IBW/kg (Calculated) : 63.9  Temp (24hrs), Avg:100 F (37.8 C), Min:98 F (36.7 C), Max:102.5 F (39.2 C)   Recent Labs Lab 06/19/16 0937 06/19/16 1003 06/20/16 0513  WBC 10.6*  --  10.1  CREATININE 1.20*  --  1.02*  LATICACIDVEN  --  1.00  --     Estimated Creatinine Clearance: 81.1 mL/min (by C-G formula based on SCr of 1.02 mg/dL).    No Known Allergies  Antimicrobials this admission: Vanc 8/27 >>  Zosyn 8/27 >>   Dose adjustments this admission: 8/28: Today will adjust Vancomycin dose to 750mg  q12h d/t SCr decreased to 1.02, CrCl >60 ml/min  Microbiology results: 8/27 BCx: ngtd 8/27 UCx:  pending   Thank you for allowing pharmacy to be a part of this patient's care.  Noah Delaineuth Lind Ausley, RPh Clinical Pharmacist [phone] Pager: [phone]

## 2016-06-21 LAB — BASIC METABOLIC PANEL
ANION GAP: 5 (ref 5–15)
CO2: 24 mmol/L (ref 22–32)
Calcium: 8.4 mg/dL — ABNORMAL LOW (ref 8.9–10.3)
Chloride: 110 mmol/L (ref 101–111)
Creatinine, Ser: 0.77 mg/dL (ref 0.44–1.00)
GFR calc Af Amer: >60 mL/min (ref >60)
Glucose, Bld: 112 mg/dL — ABNORMAL HIGH (ref 65–99)
POTASSIUM: 3.7 mmol/L (ref 3.5–5.1)
SODIUM: 139 mmol/L (ref 135–145)

## 2016-06-21 LAB — CBC
HEMATOCRIT: 32.4 % — AB (ref 36.0–46.0)
HEMOGLOBIN: 10.4 g/dL — AB (ref 12.0–15.0)
MCH: 31.1 pg (ref 26.0–34.0)
MCHC: 32.1 g/dL (ref 30.0–36.0)
MCV: 97 fL (ref 78.0–100.0)
PLATELETS: 221 10*3/uL (ref 150–400)
RBC: 3.34 MIL/uL — ABNORMAL LOW (ref 3.87–5.11)
RDW: 15.4 % (ref 11.5–15.5)
WBC: 7.1 10*3/uL (ref 4.0–10.5)

## 2016-06-21 LAB — URINE CULTURE

## 2016-06-21 MED ORDER — CETAPHIL MOISTURIZING EX LOTN
TOPICAL_LOTION | CUTANEOUS | Status: DC | PRN
  Filled 2016-06-21: qty 473

## 2016-06-21 MED ORDER — DOCUSATE SODIUM 100 MG PO CAPS: 100.0000 mg | ORAL_CAPSULE | Freq: Every day | ORAL | Status: DC | PRN

## 2016-06-21 MED ORDER — DOXYCYCLINE HYCLATE 100 MG PO TABS
100.0000 mg | ORAL_TABLET | Freq: Two times a day (BID) | ORAL | Status: DC
  Administered 2016-06-21 – 2016-06-22 (×4): 100 mg via ORAL
  Filled 2016-06-21 (×4): qty 1

## 2016-06-21 MED ORDER — RISAQUAD PO CAPS
1.0000 | ORAL_CAPSULE | Freq: Every day | ORAL | Status: DC
  Administered 2016-06-22 – 2016-06-25 (×4): 1 via ORAL
  Filled 2016-06-21 (×4): qty 1

## 2016-06-21 NOTE — Progress Notes (Signed)
Family Medicine Teaching Service Daily Progress Note Intern Pager: [phone]  Patient name: [name] Medical record number: [id number] Date of birth: Nov 21, 1955 Age: 60 y.o. Gender: female  Primary Care Provider: Norberto Sorenson, MD Consultants: None Code Status: FULL  Pt Overview and Major Events to Date:  8/27: Admitted to FPTS for IV antibiotics to treat LE cellulitis  Assessment and Plan: Sydney Camberos Reeseis a 60 y.o.femalepresenting with fever, left lower extremity erythema. PMH is significant for obesity, chronic venous stasis bilaterally, chronic pain syndrome, ETOH abuse.  #Left LE cellulitis -Patient has history of multiple admission for LE cellulitis. DVT/PE was considered with D dimer in ED elevated and Wells score 3-4.5 for possible immobility over approx3 days, however LE dopplers were negative for evidence of DVT. PE less likely given no dyspnea, no EKG changes suggestive of heart strain, no increased O2 requirement.  - vanc/zosyn, currently day #3 -- transition to doxycycline 100 mg BID today - Blood cultures no growth x 2d - UCx with 50k colonies Ecoli, no UTI symptoms, consider adequately treated with 3 days of zosyn and no further therapy necessary - follow cellulitis margins - Wound care signed off: apply betadine to area twice daily, cover with gauze and secure with paper tape.  #AKI, resolved -Cr 1.02 improving from 1.20 at admission, now 0.77 which is at baseline. (0.74-1.0)  AKI was likely due to reduced PO intake - maintenance IVF - follow renal function  #Anemia - hemoglobin low at 10.4 however this appears to be near baseline for the patient, Hgb 9-10 based on previous studies. Patient endorses taking iron supplement at home. - monitor with AM CBC - hold iron in setting of acute infection   Venous insufficiency-previously followed by a vein specialist. Patient advised to wear compression stockings but refuses to wear them due to being too hot and  uncomfortable. - monitor  Chronic pain syndrome- Home pain medication Norco 7.5-325 mg q8h for chronic pain. Per chart review her PCP has been trying to have her go to a pain clinic. Narcotic database reviewed. Percecet last dispensed 05/31/2016 #16 - continue vicoden 5-325 mg q6h for chronic pain symptoms  ETOH abuse- 6-8 beers daily. Last reported drink on Thursday 8/24. No evidence of withdrawal - CIWA scores 0 o/n, did have a score of 5 this AM - resources from social work were provided to help with EtOH cessation, social work was not officially consulted out of respect for patient's wishes  - continue to monitor on CIWA  Hypokalemia, improved: K 3.3 > 3.6 > 3.7 this morning - follow BMP  Depression-reports taking both cymbalta and fluoxetine at home, however only fluoxetine on her med list. Cymbalta prescribed from [facility] behavioral health, per care everywhere. She states she has not taken either for the last 10 days. Per [facility] care every where notes she is also tacking adderall. Unclear when her last dose was. Mood this am pleasant and appropriate. - continue home Prozac  - trazodone qHS  - hold adderall at this time - assess mood  FEN/GI:regular diet, NS 125 cc/hr Prophylaxis: lovenox  Disposition: Home pending clinical improvement  Subjective:  Patient believes cellulitis has improved. Denies pain, fever or chills over night.  Otherwise, no N/V/D.  Notes she has not had a BM in a few days but this is normal for her.    Objective: Temp:  [97.8 F (36.6 C)-99.6 F (37.6 C)] 97.8 F (36.6 C) (08/29 0753) Pulse Rate:  [71-88] 71 (08/29 0753) Resp:  [16-18]  16 (08/29 0753) BP: (100-129)/(52-70) 100/54 (08/29 0753) SpO2:  [96 %-100 %] 97 % (08/29 0753) Weight:  [285 lb 3.2 oz (129.4 kg)] 285 lb 3.2 oz (129.4 kg) (08/28 2106) Physical Exam: General: Pleasant, rests comfortably in bed, NAD Cardiovascular: RRR, no m/r/g Respiratory: CTA bil no W/R/R Abdomen: soft,  nontender, nondistended Extremities: +LLE cellulitis extends about 2 inches beyond line, however redness is patchy without sharp demarcation.  Skin is slightly warmer on L>R.    Laboratory:  Recent Labs Lab 06/19/16 0937 06/20/16 0513 06/21/16 0751  WBC 10.6* 10.1 7.1  HGB 12.9 10.8* 10.4*  HCT 39.8 33.6* 32.4*  PLT 224 208 221    Recent Labs Lab 06/19/16 0937 06/20/16 0513 06/21/16 0751  NA 138 137 139  K 3.3* 3.6 3.7  CL 107 110 110  CO2 21* 20* 24  BUN 9 6 <5*  CREATININE 1.20* 1.02* 0.77  CALCIUM 9.0 8.3* 8.4*  PROT 7.1  --   --   BILITOT 0.5  --   --   ALKPHOS 75  --   --   ALT 11*  --   --   AST 20  --   --   GLUCOSE 117* 151* 112*    Imaging/Diagnostic Tests: LLE U/S 06/20/2016 Summary: - Technically limited due to body habitus and pain. - No evidence of deep vein or superficial thrombosis involving the   left lower extremity and right common femoral vein.  Other specific details can be found in the table(s) above. Prepared and Electronically Authenticated by Lemar LivingsBrandon Cain, MD 2017-08-28T13:07:38   Howard PouchLauren Kamren Heskett, MD 06/21/2016, 9:52 AM PGY-1, [facility] FPTS Intern pager: [phone], text pages welcome

## 2016-06-22 LAB — BASIC METABOLIC PANEL
ANION GAP: 5 (ref 5–15)
BUN: <5 mg/dL — ABNORMAL LOW (ref 6–20)
CHLORIDE: 111 mmol/L (ref 101–111)
CO2: 24 mmol/L (ref 22–32)
CREATININE: 0.81 mg/dL (ref 0.44–1.00)
Calcium: 8.5 mg/dL — ABNORMAL LOW (ref 8.9–10.3)
GFR calc non Af Amer: >60 mL/min (ref >60)
Glucose, Bld: 102 mg/dL — ABNORMAL HIGH (ref 65–99)
POTASSIUM: 3.7 mmol/L (ref 3.5–5.1)
Sodium: 140 mmol/L (ref 135–145)

## 2016-06-22 LAB — CBC
HEMATOCRIT: 30.4 % — AB (ref 36.0–46.0)
HEMOGLOBIN: 9.6 g/dL — AB (ref 12.0–15.0)
MCH: 30.5 pg (ref 26.0–34.0)
MCHC: 31.6 g/dL (ref 30.0–36.0)
MCV: 96.5 fL (ref 78.0–100.0)
Platelets: 230 10*3/uL (ref 150–400)
RBC: 3.15 MIL/uL — AB (ref 3.87–5.11)
RDW: 15.6 % — ABNORMAL HIGH (ref 11.5–15.5)
WBC: 6.6 10*3/uL (ref 4.0–10.5)

## 2016-06-22 LAB — C DIFFICILE QUICK SCREEN W PCR REFLEX
C DIFFICILE (CDIFF) INTERP: NOT DETECTED
C DIFFICILE (CDIFF) TOXIN: NEGATIVE
C DIFFICLE (CDIFF) ANTIGEN: NEGATIVE

## 2016-06-22 MED ORDER — SODIUM CHLORIDE 0.9 % IV BOLUS (SEPSIS)
1000.0000 mL | Freq: Once | INTRAVENOUS | Status: AC
  Administered 2016-06-22: 1000 mL via INTRAVENOUS

## 2016-06-22 NOTE — Progress Notes (Signed)
Family Medicine Teaching Service Daily Progress Note Intern Pager: [phone]  Patient name: [name] Medical record number: [id number] Date of birth: 1955/12/11 Age: 60 y.o. Gender: female  Primary Care Provider: Norberto SorensonSHAW,EVA, MD Consultants: None Code Status: FULL  Pt Overview and Major Events to Date:  8/27 Admit to FPTS for IV antibiotics to treat LE cellulitis 8/29   Assessment and Plan: Sydney HornFonda T Reeseis a 60 y.o.femalepresenting with fever, left lower extremity erythema. PMH is significant for obesity, chronic venous stasis bilaterally, chronic pain syndrome, ETOH abuse.  #Left LE cellulitis - Worsening, cellulitis looks red beyond margins of the line drawn, worse than yesterday. Patient afebrile with no leukocytosis. LE dopplers were negative for evidence of DVT.  - s/p three days of vanc/zosyn, transitioned to 100 mg doxycycline BID yesterday (Now day #2)  - due to worsening of cellulitis, will monitor closely today, if cellulitis appears worsened this evening will transition back to vanc/zosyn - Blood cultures no growth x 2d - follow cellulitis margins - Wound care signed off: apply betadine to area twice daily, cover with gauze and secure with paper tape.  #Diarrhea, new -  Patient developed diarrhea last night with 4-6 episodes overnight.  Soft, then watery.  May be secondary to recent antibiotic use.  Consider c. Diff if it does not improve.  - added probiotic - monitor, consider c diff if does not improve  #Headache, intermittent - patient complains of headache this morning that is frontal, no prodrome or aura, no visual changes.  Patient has never been treated for migraines. May be secondary to dehydration with diarrhea overnight.  - Gave 1L bolus NS - monitor, consider imitrex trial if develops into a migraine  #Anemia - hemoglobin decreased to 9.6 from 10.4 yesterday.  Baseline for the patient, Hgb 9-10 based on previous studies. Patient endorses taking iron  supplement at home. - monitor with AM CBC - hold iron in setting of acute infection  #AKI, resolved -Cr 0.81  improving from 1.20 at admission, now at baseline (0.74-1.0)  AKI was likely due to reduced PO intake - follow renal function  #Venous insufficiency-previously followed by a vein specialist. Patient advised to wear compression stockings but refuses to wear them due to being too hot and uncomfortable. - monitor  #Chronic pain syndrome- Home pain medication Norco 7.5-325 mg q8h for chronic pain. Per chart review her PCP has been trying to have her go to a pain clinic. Narcotic database reviewed. Percecet last dispensed 05/31/2016 #16 - continue vicoden 5-325 mg q6h for chronic pain symptoms  #ETOH abuse-6-8 beers daily. Last reported drink on Thursday 8/24. CIWA scores variable between 0-9. Resources from social work were provided to help with EtOH cessation, social work was not officially consulted out of respect for patient's wishes  - CIWA scores 0 o/n, one score of 9 this AM for N/V, tremor, anxiety, headache and agitation. Has received ativan. - continue to monitor on CIWA  #Hypokalemia, improved: K 3.3 > 3.6 > 3.7 >3.7 this morning - follow BMP  Depression-reports taking both cymbalta and fluoxetine at home, however only fluoxetine on her med list. [facility] care everywhere. She states she has not taken either for the last 10 days. [facility] where notes she is also tacking adderall. Unclear when her last dose was. Mood this am pleasant and appropriate. - continue home Prozac  - trazodone qHS  - hold adderall at this time - assess mood  FEN/GI:regular diet Prophylaxis: lovenox  Disposition: Home pending clinical improvement on PO abx  Subjective:  Patient endorses multiple episodes of diarrhea overnight (at least 6), later episodes were watery. Also with headache. Otherwise no N/V.  Pain in LLE is controlled  however she feels redness is worsened from yesterday.  Objective: Temp:  [97.5 F (36.4 C)-98.7 F (37.1 C)] 98.4 F (36.9 C) (08/30 0937) Pulse Rate:  [87-93] 93 (08/30 0937) Resp:  [17-24] 19 (08/30 0937) BP: (130-142)/(55-77) 130/55 (08/30 0937) SpO2:  [98 %-100 %] 98 % (08/30 0937) Weight:  [280 lb (127 kg)] 280 lb (127 kg) (08/29 1959) Physical Exam: General: NAD, rests comfortably in bed Cardiovascular: RRR no m/r/g Respiratory: CTA bil, no W/R/R Abdomen: soft, mildly tender to palpation diffusely with no murphy sign, no HSM, normoactive BS Extremities: +LLE edema and redness extends beyond skin line, appears worse than yesterday  Laboratory:  Recent Labs Lab 06/20/16 0513 06/21/16 0751 06/22/16 0414  WBC 10.1 7.1 6.6  HGB 10.8* 10.4* 9.6*  HCT 33.6* 32.4* 30.4*  PLT 208 221 230    Recent Labs Lab 06/19/16 0937 06/20/16 0513 06/21/16 0751 06/22/16 0414  NA 138 137 139 140  K 3.3* 3.6 3.7 3.7  CL 107 110 110 111  CO2 21* 20* 24 24  BUN 9 6 <5* <5*  CREATININE 1.20* 1.02* 0.77 0.81  CALCIUM 9.0 8.3* 8.4* 8.5*  PROT 7.1  --   --   --   BILITOT 0.5  --   --   --   ALKPHOS 75  --   --   --   ALT 11*  --   --   --   AST 20  --   --   --   GLUCOSE 117* 151* 112* 102*      Imaging/Diagnostic Tests: No results found.   Howard Pouch, MD 06/22/2016, 11:08 AM PGY-1, [facility] Family Medicine FPTS Intern pager: [phone], text pages welcome

## 2016-06-22 NOTE — Progress Notes (Signed)
Pt Is having increased frequency of BM's. Loose stools and urge to go is increasing. Pt unable to hold it.

## 2016-06-23 LAB — BASIC METABOLIC PANEL
ANION GAP: 6 (ref 5–15)
CHLORIDE: 107 mmol/L (ref 101–111)
CO2: 28 mmol/L (ref 22–32)
Calcium: 8.7 mg/dL — ABNORMAL LOW (ref 8.9–10.3)
Creatinine, Ser: 0.74 mg/dL (ref 0.44–1.00)
GFR calc Af Amer: >60 mL/min (ref >60)
Glucose, Bld: 97 mg/dL (ref 65–99)
POTASSIUM: 3.8 mmol/L (ref 3.5–5.1)
SODIUM: 141 mmol/L (ref 135–145)

## 2016-06-23 LAB — CBC
HCT: 29.9 % — ABNORMAL LOW (ref 36.0–46.0)
HEMOGLOBIN: 9.2 g/dL — AB (ref 12.0–15.0)
MCH: 30 pg (ref 26.0–34.0)
MCHC: 30.8 g/dL (ref 30.0–36.0)
MCV: 97.4 fL (ref 78.0–100.0)
PLATELETS: 282 10*3/uL (ref 150–400)
RBC: 3.07 MIL/uL — AB (ref 3.87–5.11)
RDW: 15.5 % (ref 11.5–15.5)
WBC: 6.5 10*3/uL (ref 4.0–10.5)

## 2016-06-23 MED ORDER — SUMATRIPTAN SUCCINATE 50 MG PO TABS
50.0000 mg | ORAL_TABLET | Freq: Once | ORAL | Status: AC
  Administered 2016-06-23: 50 mg via ORAL
  Filled 2016-06-23: qty 1

## 2016-06-23 MED ORDER — VANCOMYCIN HCL IN DEXTROSE 750-5 MG/150ML-% IV SOLN
750.0000 mg | Freq: Two times a day (BID) | INTRAVENOUS | Status: DC
  Administered 2016-06-23 – 2016-06-24 (×3): 750 mg via INTRAVENOUS
  Filled 2016-06-23 (×4): qty 150

## 2016-06-23 MED ORDER — VANCOMYCIN HCL 10 G IV SOLR
1500.0000 mg | Freq: Once | INTRAVENOUS | Status: AC
  Administered 2016-06-23: 1500 mg via INTRAVENOUS
  Filled 2016-06-23: qty 1500

## 2016-06-23 MED ORDER — PIPERACILLIN-TAZOBACTAM 3.375 G IVPB
3.3750 g | Freq: Three times a day (TID) | INTRAVENOUS | Status: DC
  Administered 2016-06-23 – 2016-06-24 (×5): 3.375 g via INTRAVENOUS
  Filled 2016-06-23 (×7): qty 50

## 2016-06-23 NOTE — Progress Notes (Signed)
Family Medicine Teaching Service Daily Progress Note Intern Pager: [phone]  Patient name: [name] Medical record number: [id number] Date of birth: 1956-09-13 Age: 60 y.o. Gender: female  Primary Care Provider: Norberto SorensonSHAW,EVA, MD Consultants: None Code Status: FULL  Pt Overview and Major Events to Date:  8/27 Admit to FPTS for IV antibiotics to treat LE cellulitis (vanc/zosyn) 8/29 transition to PO ABx (Doxycycline) 8/30 worsening in cellulitis, transition back to vanc/zosyn  Assessment and Plan: [name] a 60 y.o.femalepresenting with fever, left lower extremity erythemathought to be cellulitis. PMH is significant for obesity, chronic venous stasis bilaterally, chronic pain syndrome, ETOH abuse.  #Left LE cellulitis - Patient transitioned back to Vanc/Zosyn yesterday evening (8/30) due to worsening in cellulitis on doxycycline.  Patient afebrile with no leukocytosis. LE dopplers were negative for evidence of DVT. Wound care signed off: apply betadine to area twice daily, cover with gauze and secure with paper tape. - s/p three days of vanc/zosyn, two days of doxycycline, restarting vanc/zosyn  - continue vanc/zosyn today until patient has clear clinical improvement, then consider transitioning to bactrim - Blood cultures no growth x 3d - follow cellulitis margins  #Diarrhea -  c. Diff negative. Likely diarrhea is secondary to recent antibiotic use.  - continue probiotic - monitor  #Headache, intermittent - patient complains of headache this morning that is frontal, no prodrome or aura, no visual changes.  Patient has never been treated for migraines. May be secondary to dehydration vs. Migraine.  - trial of imitrix today  #Anemia- hemoglobin decreased to 9.2 from 9.6 yesterday, gradually decreasing over course of hospitalization.  Baseline for the patient, Hgb 9-10 based on previous studies. Patient endorses taking iron supplement at home. - monitor with AM CBC - hold  iron in setting of acute infection  #AKI, resolved-Cr 0.74  improving from 1.20 at admission, now at baseline (0.74-1.0) AKI was likely due to reduced PO intake - follow renal function  #Venous insufficiency-previously followed by a vein specialist. Patient advised to wear compression stockings but refuses to wear them due to being too hot and uncomfortable. - monitor  #Chronic pain syndrome- Home pain medication Norco 7.5-325 mg q8h for chronic pain. Per chart review her PCP has been trying to have her go to a pain clinic. Narcotic database reviewed. Percecet last dispensed 05/31/2016 #16 - continue vicoden 5-325 mg q6h for chronic pain symptoms  #ETOH abuse-6-8 beers daily. Last reported drink on Thursday 8/24. CIWA scores variable between 0-9. Resources from social work were provided to help with EtOH cessation, social work was not officially consulted out of respect for patient's wishes  - CIWA scores 0, 3, 0 overnight.  Scored for anxiety, headache and agitation. - continue to monitor on CIWA   Depression-reports taking both cymbalta and fluoxetine at home, however only fluoxetine on her med list. [facility], per care everywhere. She states she has not taken either for the last 10 days. [facility] every where notes she is also taking adderall. Unclear when her last dose was. Mood this am pleasant and appropriate. - continue home Prozac  - trazodone qHS  - hold adderall at this time - assess mood   FEN/GI:regular diet Prophylaxis: lovenox  Disposition: Home pending clinical improvement after successful transition to PO Abx  Subjective:  Patient endorses headache today. Diarrhea improved.  Feels overall cellulitis improved overnight.   Objective: Temp:  [97.9 F (36.6 C)-98.7 F (37.1 C)] 98.7 F (37.1 C) (08/31 0854)  Pulse Rate:  [77-86] 78 (08/31 0854) Resp:  [18-19] 18 (08/31 0854) BP: (117-145)/(47-79) 123/61 (08/31  1003) SpO2:  [96 %-98 %] 98 % (08/31 0854) Physical Exam: General: NAD, rests comfortably Cardiovascular: RRR, no m/r/g Respiratory: CTA bil no W/R/R Abdomen: soft, nontender, nondistended Extremities: +RLE erythema, improving.  RLE Edema is worse than yesterday morning. Palpable distal pulses  Laboratory:  Recent Labs Lab 06/21/16 0751 06/22/16 0414 06/23/16 0354  WBC 7.1 6.6 6.5  HGB 10.4* 9.6* 9.2*  HCT 32.4* 30.4* 29.9*  PLT 221 230 282    Recent Labs Lab 06/19/16 0937  06/21/16 0751 06/22/16 0414 06/23/16 0354  NA 138  < > 139 140 141  K 3.3*  < > 3.7 3.7 3.8  CL 107  < > 110 111 107  CO2 21*  < > 24 24 28   BUN 9  < > <5* <5* <5*  CREATININE 1.20*  < > 0.77 0.81 0.74  CALCIUM 9.0  < > 8.4* 8.5* 8.7*  PROT 7.1  --   --   --   --   BILITOT 0.5  --   --   --   --   ALKPHOS 75  --   --   --   --   ALT 11*  --   --   --   --   AST 20  --   --   --   --   GLUCOSE 117*  < > 112* 102* 97  < > = values in this interval not displayed.    Imaging/Diagnostic Tests: No results found.   Howard Pouch, MD 06/23/2016, 11:06 AM PGY-1, [facility] Family Medicine FPTS Intern pager: [phone], text pages welcome

## 2016-06-23 NOTE — Progress Notes (Signed)
Pharmacy Antibiotic Note  Thomas HoffFonda T Matas is a 60 y.o. female admitted on 06/19/2016 with cellulitis.  Pharmacy has been consulted for Vancomycin/Zosyn dosing. Changing back to Vanco/Zosyn from PO Doxycycline due to worsening cellulitis. WBC WNL. Renal function good. Other labs reviewed.   Plan: -Vancomycin 1500 mg IV x 1, then given 750 mg IV q12h -Zosyn 3.375G IV q8h to be infused over 4 hours -Trend WBC, temp, renal function  -Drug levels at steady state  Height: 5\' 8"  (172.7 cm) Weight: 280 lb (127 kg) IBW/kg (Calculated) : 63.9  Temp (24hrs), Avg:98.3 F (36.8 C), Min:97.9 F (36.6 C), Max:98.7 F (37.1 C)   Recent Labs Lab 06/19/16 0937 06/19/16 1003 06/20/16 0513 06/21/16 0751 06/22/16 0414  WBC 10.6*  --  10.1 7.1 6.6  CREATININE 1.20*  --  1.02* 0.77 0.81  LATICACIDVEN  --  1.00  --   --   --     Estimated Creatinine Clearance: 106.5 mL/min (by C-G formula based on SCr of 0.81 mg/dL).    No Known Allergies   Abran DukeLedford, [place] 06/23/2016 1:07 AM

## 2016-06-23 NOTE — Discharge Summary (Signed)
[facility]  Patient name: Sydney HoffFonda T Rando Medical record number: [id number] Date of birth: 03/22/1956 Age: 60 y.o. Gender: female Date of Admission: 06/19/2016  Date of Discharge: 06/25/2016 Admitting Physician: Latrelle DodrillBrittany J McIntyre, MD  Primary Care Provider: Norberto SorensonSHAW,EVA, MD Consultants:  None  Indication for Hospitalization: LLE edema  Discharge Diagnoses/Problem List:  Patient Active Problem List   Diagnosis Date Noted  . Chronic pain syndrome   . Cellulitis of left leg 06/19/2016  . 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 Cellulitis of left lower extremity 01/09/2016  . Psoriatic arthritis (HCC)   . Cough   . Wheezing   . Depression 12/05/2015  . Chronic anemia 12/05/2015  . Chronic venous insufficiency 12/04/2015  . Alcohol abuse 11/21/2014  . Anxiety state 11/21/2014  . BMI 40.0-44.9, adult (HCC) 11/21/2014  . Epistaxis 09/24/2014     Disposition: Home  Discharge Condition: Stable, improved  Discharge Exam:  General: NAD, rests comfortably Cardiovascular: RRR, no m/r/g Respiratory: CTA bil no W/R/R Abdomen: soft, nontender, nondistended Extremities: +RLE erythema within the marked borders. Mild increase in warmth towards the knee. 2+ edema to RLE. Palpable distal pulses  Brief Hospital Course:  Cellulitis: Patient was admitted for LLE pain, redness and edema concerning for cellulitis vs. DVT.  Lower extremity dopplers were negative for evidence of DVT.  Patient was treated for cellulitis with vanc/zosyn for three days with clinical improvement. She was afebrile and without leukocytosis. She was transitioned off of IV antibiotics to doxycycline, however was found to have worsening rubor and pain, and so was transitioned back to vanc/zosyn.  Diarrhea: Patient did have diarrhea during hospitalization, negative c.diff.  Diarrhea thought to be secondary to antibiotic use.  Probiotic started.   Anemia: Patient noted to have hgb gradually  decreasing over course of hospitalization.Baseline for the patient, Hgb 9-10 based on previous studies. Patient endorses taking iron supplement at home.  Iron supplement was held during hospitalization due to active infection.  EtOH Abuse: Patient was monitored on CIWA throughout hospitalization.  She expressed significant motivation to discontinue drinking.  She was not interested in being seen by social work, however was provided with the paper resources from social work regarding alcohol cessation.   Depression: Patient reported taking both cymbalta and fluoxetine at home, however only fluoxetine on her med list. [facility], per care everywhere. On admission she stated she had not taken either for the last 10 days. Per [facility] care every where notes she is also taking adderall. Unclear when her last dose was. Mood pleasant and appropriate throughout hospitalization.    Issues for Follow Up:  1. Repeat CBC at follow up given mild anemia in the hospital 2. EtOH abuse - patient motivated to quit drinking.  Continued counseling and resources will be necessary at discharge. 3. Depression regimen: Patient may be on cymbalta and prozac at home for depression.  Unclear regimen, will have to follow up with PCP for medication reconciliation. Discharged with Cymbalta as this appeared to be her most recently prescribed anti-depressant.     Significant Procedures: None  Significant Labs and Imaging:   Recent Labs Lab 06/21/16 0751 06/22/16 0414 06/23/16 0354  WBC 7.1 6.6 6.5  HGB 10.4* 9.6* 9.2*  HCT 32.4* 30.4* 29.9*  PLT 221 230 282    Recent Labs Lab 06/19/16 0937 06/20/16 0513 06/21/16 0751 06/22/16 0414 06/23/16 0354  NA 138 137 139 140 141  K 3.3* 3.6 3.7 3.7 3.8  CL 107 110  110 111 107  CO2 21* 20* 24 24 28   GLUCOSE 117* 151* 112* 102* 97  BUN 9 6 <5* <5* <5*  CREATININE 1.20* 1.02* 0.77 0.81 0.74  CALCIUM 9.0 8.3* 8.4* 8.5* 8.7*  ALKPHOS 75   --   --   --   --   AST 20  --   --   --   --   ALT 11*  --   --   --   --   ALBUMIN 4.0  --   --   --   --     Dg Chest Port 1 View  Result Date: 06/19/2016 CLINICAL DATA:  Labored breathing and seizure. EXAM: PORTABLE CHEST 1 VIEW COMPARISON:  12/09/2015 FINDINGS: The heart size and mediastinal contours are within normal limits. Both lungs are clear. The visualized skeletal structures are unremarkable. IMPRESSION: No active disease. Electronically Signed   By: Signa Kell M.D.   On: 06/19/2016 09:46    Results/Tests Pending at Time of Discharge: None  Discharge Medications:  Bactrim  Discharge Instructions: Please refer to Patient Instructions section of EMR for full details.  Patient was counseled important signs and symptoms that should prompt return to medical care, changes in medications, dietary instructions, activity restrictions, and follow up appointments.   Follow-Up Appointments: Norberto Sorenson, MD - Family Medicine (PCP) - Patient to call for appt.  Durward Parcel, DO 06/23/2016, 6:10 AM PGY-1, [facility]

## 2016-06-24 DIAGNOSIS — L02419 Cutaneous abscess of limb, unspecified: Secondary | ICD-10-CM

## 2016-06-24 DIAGNOSIS — L03119 Cellulitis of unspecified part of limb: Secondary | ICD-10-CM

## 2016-06-24 LAB — BASIC METABOLIC PANEL
ANION GAP: 7 (ref 5–15)
BUN: <5 mg/dL — ABNORMAL LOW (ref 6–20)
CALCIUM: 8.8 mg/dL — AB (ref 8.9–10.3)
CHLORIDE: 109 mmol/L (ref 101–111)
CO2: 24 mmol/L (ref 22–32)
Creatinine, Ser: 0.72 mg/dL (ref 0.44–1.00)
GFR calc non Af Amer: >60 mL/min (ref >60)
GLUCOSE: 90 mg/dL (ref 65–99)
POTASSIUM: 3.9 mmol/L (ref 3.5–5.1)
Sodium: 140 mmol/L (ref 135–145)

## 2016-06-24 LAB — CBC
HEMATOCRIT: 31.6 % — AB (ref 36.0–46.0)
HEMOGLOBIN: 9.8 g/dL — AB (ref 12.0–15.0)
MCH: 30.3 pg (ref 26.0–34.0)
MCHC: 31 g/dL (ref 30.0–36.0)
MCV: 97.8 fL (ref 78.0–100.0)
Platelets: 370 10*3/uL (ref 150–400)
RBC: 3.23 MIL/uL — AB (ref 3.87–5.11)
RDW: 15.4 % (ref 11.5–15.5)
WBC: 7.3 10*3/uL (ref 4.0–10.5)

## 2016-06-24 LAB — CULTURE, BLOOD (ROUTINE X 2)
CULTURE: NO GROWTH
Culture: NO GROWTH

## 2016-06-24 MED ORDER — SUMATRIPTAN SUCCINATE 50 MG PO TABS
50.0000 mg | ORAL_TABLET | ORAL | Status: DC | PRN
  Administered 2016-06-24: 50 mg via ORAL
  Filled 2016-06-24 (×2): qty 1

## 2016-06-24 MED ORDER — ACYCLOVIR 5 % EX OINT
TOPICAL_OINTMENT | CUTANEOUS | Status: DC
  Administered 2016-06-24 – 2016-06-25 (×6): via TOPICAL
  Administered 2016-06-25: 1 via TOPICAL
  Administered 2016-06-25: 12:00:00 via TOPICAL
  Filled 2016-06-24: qty 15

## 2016-06-24 MED ORDER — SULFAMETHOXAZOLE-TRIMETHOPRIM 800-160 MG PO TABS
1.0000 | ORAL_TABLET | Freq: Two times a day (BID) | ORAL | Status: DC
  Administered 2016-06-24 – 2016-06-25 (×3): 1 via ORAL
  Filled 2016-06-24 (×3): qty 1

## 2016-06-24 NOTE — Progress Notes (Signed)
Family Medicine Teaching Service Daily Progress Note Intern Pager: [phone]  Patient name: [name] Medical record number: [id number] Date of birth: 10/02/1956 Age: 60 y.o. Gender: female  Primary Care Provider: Norberto Sorenson, MD Consultants: None Code Status: FULL  Pt Overview and Major Events to Date:  8/27 Admit to FPTS for IV antibiotics to treat LE cellulitis (vanc/zosyn) 8/29 transition to PO ABx (Doxycycline) 8/30 worsening in cellulitis, transition back to vanc/zosyn  Assessment and Plan: Sydney Montesano Reeseis a 60 y.o.femalepresenting with fever, left lower extremity erythemathought to be cellulitis. PMH is significant for obesity, chronic venous stasis bilaterally, chronic pain syndrome, ETOH abuse.  #Left LE cellulitis: Patient transitioned back to Vanc/Zosyn yesterday evening (8/30) due to worsening in cellulitis on doxycycline.  Patient afebrile with no leukocytosis. LE dopplers were negative for evidence of DVT. Wound care signed off: apply betadine to area twice daily, cover with gauze and secure with paper tape. - S/p three days of vanc/zosyn, two days of doxycycline, restarted on vanc/zosyn 8/31, will transition pt to Bactrim DS 800 mg PO BID, if tolerant, will anticipate d/c 9/1 and continue for additional 12 days to complete 14 day course of abx - Continue vanc/zosyn today until patient has clear clinical improvement, then consider transitioning to bactrim - Blood cultures no growth x 4d - Cellulitis within margins today, follow cellulitis margins  #Diarrhea: C. Diff negative. Likely diarrhea is secondary to recent antibiotic use.  - Continue probiotic - monitor stools  #Headache, intermittent: patient complains of headache this morning that is frontal, no prodrome or aura, no visual changes.  Patient has never been treated for migraines. May be secondary to dehydration vs. Migraine.  - Tolerating Imitrix well, will continue  #Anemia: hemoglobin decreased to 9.2 from  9.6 yesterday, gradually decreasing over course of hospitalization.  Baseline for the patient, Hgb 9-10 based on previous studies. Patient endorses taking iron supplement at home. - Hb improved 9.2 > 9.8 as of 9/1 - Monitor with AM CBC - Hold iron in setting of acute infection  #AKI, resolved:Cr 0.74  improving from 1.20 at admission, now at baseline (0.74-1.0) AKI was likely due to reduced PO intake - Cr 0.72 as of 9/1 - Follow renal function  #Venous insufficiency: previously followed by a vein specialist. Patient advised to wear compression stockings but refuses to wear them due to being too hot and uncomfortable. - Monitor for worsening  #Chronic pain syndrome: Home pain medication Norco 7.5-325 mg q8h for chronic pain. Per chart review her PCP has been trying to have her go to a pain clinic. Narcotic database reviewed. Percecet last dispensed 05/31/2016 #16 - Continue vicoden 5-325 mg q6h for chronic pain symptoms  #ETOH abuse:6-8 beers daily. Last reported drink on Thursday 8/24. CIWA scores variable between 0-9. Resources from social work were provided to help with EtOH cessation, social work was not officially consulted out of respect for patient's wishes  - CIWA scores 0, 4, 0 overnight.  Scored for anxiety, headache and agitation - Continue to monitor on CIWA  #Depression:reports taking both cymbalta and fluoxetine at home, however only fluoxetine on her med list. Cymbalta prescribed from [facility], per care everywhere. She states she has not taken either for the last 10 days. Per [facility] care every where notes she is also taking adderall. Unclear when her last dose was. Mood this am pleasant and appropriate. - Continue home Prozac  - Trazodone qHS  - Hold adderall at this time - Assess mood  #FEN/GI:regular  diet #Prophylaxis: lovenox  Disposition: Home pending clinical improvement after successful transition to PO Abx today (9/1)  Subjective:  Patient  endorses headache today with relief using Imitrix. Diarrhea improved.  Feels overall cellulitis improved overnight.   Objective: Temp:  [97.5 F (36.4 C)-98.6 F (37 C)] 98.5 F (36.9 C) (09/01 0854) Pulse Rate:  [74-90] 86 (09/01 0854) Resp:  [17-20] 17 (09/01 0854) BP: (133-160)/(68-87) 160/87 (09/01 0854) SpO2:  [95 %-99 %] 97 % (09/01 0854) Physical Exam: General: NAD, rests comfortably Cardiovascular: RRR, no m/r/g Respiratory: CTA bil no W/R/R Abdomen: soft, nontender, nondistended Extremities: +RLE erythema, improving.  RLE Edema is better than yesterday morning. Palpable distal pulses      Laboratory:  Recent Labs Lab 06/22/16 0414 06/23/16 0354 06/24/16 0644  WBC 6.6 6.5 7.3  HGB 9.6* 9.2* 9.8*  HCT 30.4* 29.9* 31.6*  PLT 230 282 370    Recent Labs Lab 06/19/16 0937  06/22/16 0414 06/23/16 0354 06/24/16 0644  NA 138  < > 140 141 140  K 3.3*  < > 3.7 3.8 3.9  CL 107  < > 111 107 109  CO2 21*  < > 24 28 24   BUN 9  < > <5* <5* <5*  CREATININE 1.20*  < > 0.81 0.74 0.72  CALCIUM 9.0  < > 8.5* 8.7* 8.8*  PROT 7.1  --   --   --   --   BILITOT 0.5  --   --   --   --   ALKPHOS 75  --   --   --   --   ALT 11*  --   --   --   --   AST 20  --   --   --   --   GLUCOSE 117*  < > 102* 97 90  < > = values in this interval not displayed.    Imaging/Diagnostic Tests: No results found.   Wendee Beaversavid J Jovahn Breit, DO 06/24/2016, 2:38 PM PGY-1, [facility] FPTS Intern pager: [phone], text pages welcome

## 2016-06-25 DIAGNOSIS — F329 Major depressive disorder, single episode, unspecified: Secondary | ICD-10-CM

## 2016-06-25 DIAGNOSIS — G4700 Insomnia, unspecified: Secondary | ICD-10-CM

## 2016-06-25 MED ORDER — SUMATRIPTAN SUCCINATE 100 MG PO TABS
100.0000 mg | ORAL_TABLET | Freq: Once | ORAL | Status: AC
  Administered 2016-06-25: 100 mg via ORAL
  Filled 2016-06-25 (×2): qty 1

## 2016-06-25 MED ORDER — FLUCONAZOLE 100 MG PO TABS
150.0000 mg | ORAL_TABLET | Freq: Once | ORAL | Status: AC
Start: 2016-06-25 — End: 2016-06-25
  Administered 2016-06-25: 150 mg via ORAL
  Filled 2016-06-25: qty 2

## 2016-06-25 MED ORDER — HYDROCODONE-ACETAMINOPHEN 5-325 MG PO TABS: 1.0000 | ORAL_TABLET | Freq: Four times a day (QID) | ORAL | 0 refills | Status: DC | PRN

## 2016-06-25 MED ORDER — DULOXETINE HCL 60 MG PO CPEP: 60.0000 mg | ORAL_CAPSULE | Freq: Every day | ORAL | 0 refills | Status: DC

## 2016-06-25 MED ORDER — SULFAMETHOXAZOLE-TRIMETHOPRIM 800-160 MG PO TABS: 1.0000 | ORAL_TABLET | Freq: Two times a day (BID) | ORAL | 0 refills | Status: DC

## 2016-06-25 MED ORDER — ACYCLOVIR 5 % EX OINT: TOPICAL_OINTMENT | CUTANEOUS | 0 refills | Status: DC

## 2016-06-25 NOTE — Progress Notes (Signed)
Family Medicine Teaching Service Daily Progress Note Intern Pager: [phone]  Patient name: [name] Medical record number: [id number] Date of birth: [birth year] Age: 60 y.o. Gender: female  Primary Care Provider: Norberto SorensonSHAW,EVA, MD Consultants: None Code Status: FULL  Pt Overview and Major Events to Date:  8/27 Admit to FPTS for IV antibiotics to treat LE cellulitis (vanc/zosyn) 8/29 transition to PO ABx (Doxycycline) 8/30 worsening in cellulitis, transition back to vanc/zosyn 9/1: Transitioned to Bactrim   Assessment and Plan: Sydney MaisFonda T Reeseis a 60 y.o.femalepresenting with fever, left lower extremity erythemathought to be cellulitis. PMH is significant for obesity, chronic venous stasis bilaterally, chronic pain syndrome, ETOH abuse.  #Left LE cellulitis: Patient transitioned to Bactrim on 9/1 with stability/mild improvement in erythema.  Patient afebrile with no leukocytosis. LE dopplers were negative for evidence of DVT. Wound care signed off: apply betadine to area twice daily, cover with gauze and secure with paper tape. Blood cultures NG (final result).  -Bactrim DS 800 mg PO BID and continue to complete 14 day course of abx - Continue vanc/zosyn today until patient has clear clinical improvement, then consider transitioning to bactrim -follow cellulitis margins  #Diarrhea: C. Diff negative. Likely diarrhea is secondary to recent antibiotic use.  - Continue probiotic - monitor stools  #Headache, intermittent: patient complains of headache this morning that is frontal, no prodrome or aura, no visual changes.  Patient has never been treated for migraines. May be secondary to dehydration vs. Migraine.  - Tolerating Imitrix well, will continue  #Anemia: hemoglobin decreased to 9.2 from 9.6 yesterday, gradually decreasing over course of hospitalization.  Baseline for the patient, Hgb 9-10 based on previous studies. Patient endorses taking iron supplement at home. - Hb improved 9.2  > 9.8 as of 9/1 - Monitor with AM CBC - Hold iron in setting of acute infection  #AKI, resolved:Cr 0.72 on 9/1 back at baseline.AKI was likely due to reduced PO intake  #Venous insufficiency: previously followed by a vein specialist. Patient advised to wear compression stockings but refuses to wear them due to being too hot and uncomfortable. - Monitor for worsening  #Chronic pain syndrome: Home pain medication Norco 7.5-325 mg q8h for chronic pain. Per chart review her PCP has been trying to have her go to a pain clinic. Narcotic database reviewed. Percecet last dispensed 05/31/2016 #16 - Continue vicoden 5-325 mg q6h for chronic pain symptoms  #ETOH abuse:6-8 beers daily. Last reported drink on Thursday 8/24. Resources from social work were provided to help with EtOH cessation, social work was not officially consulted out of respect for patient's wishes. CIWA scores of 0 overnight.  - Continue to monitor on CIWA  #Depression:reports taking both cymbalta and fluoxetine at home, however only fluoxetine on her med list. [facility], per care everywhere. She states she has not taken either for the last 10 days. [facility] every where notes she is also taking adderall. Unclear when her last dose was. Mood this am pleasant and appropriate. - Continue home Prozac  - Trazodone qHS  - Hold adderall at this time - Assess mood  #FEN/GI:regular diet #Prophylaxis: lovenox  Disposition: Home pending clinical improvement after successful transition to PO Abx, likely d/c today on 9/2   Subjective:  Patient feels that cellulitis is overall improved but does complain of increase in swelling of RLE. Reports that the swelling made her uncomfortable overnight. Improvement in pain and erythema of RLE.   Objective: Temp:  [98.4 F (36.9  C)-98.9 F (37.2 C)] 98.6 F (37 C) (09/02 0423) Pulse Rate:  [73-82] 82 (09/02 0423) Resp:  [18-20] 20 (09/02 0423) BP:  (124-145)/(50-74) 124/50 (09/02 0423) SpO2:  [96 %-99 %] 96 % (09/02 0423) Physical Exam: General: NAD, rests comfortably Cardiovascular: RRR, no m/r/g Respiratory: CTA bil no W/R/R Abdomen: soft, nontender, nondistended Extremities: +RLE erythema within the marked borders. Mild increase in warmth towards the knee. 2+ edema to RLE. Palpable distal pulses    Laboratory:  Recent Labs Lab 06/22/16 0414 06/23/16 0354 06/24/16 0644  WBC 6.6 6.5 7.3  HGB 9.6* 9.2* 9.8*  HCT 30.4* 29.9* 31.6*  PLT 230 282 370    Recent Labs Lab 06/19/16 0937  06/22/16 0414 06/23/16 0354 06/24/16 0644  NA 138  < > 140 141 140  K 3.3*  < > 3.7 3.8 3.9  CL 107  < > 111 107 109  CO2 21*  < > 24 28 24   BUN 9  < > <5* <5* <5*  CREATININE 1.20*  < > 0.81 0.74 0.72  CALCIUM 9.0  < > 8.5* 8.7* 8.8*  PROT 7.1  --   --   --   --   BILITOT 0.5  --   --   --   --   ALKPHOS 75  --   --   --   --   ALT 11*  --   --   --   --   AST 20  --   --   --   --   GLUCOSE 117*  < > 102* 97 90  < > = values in this interval not displayed.   Arvilla Market, DO 06/25/2016, 9:34 AM PGY-2, [facility] FPTS Intern pager: [phone], text pages welcome

## 2016-06-25 NOTE — Discharge Instructions (Signed)
Take the Bactrim through 9/12 to complete your course for cellulitis.   Take Cymbalta and not Prozac at discharge. Follow up with your PCP regarding your medication regimen.

## 2016-11-16 ENCOUNTER — Ambulatory Visit (INDEPENDENT_AMBULATORY_CARE_PROVIDER_SITE_OTHER): Payer: BC Managed Care – PPO | Admitting: Family Medicine

## 2016-11-16 ENCOUNTER — Ambulatory Visit (INDEPENDENT_AMBULATORY_CARE_PROVIDER_SITE_OTHER): Payer: BC Managed Care – PPO

## 2016-11-16 VITALS — BP 124/80 | HR 84 | Temp 98.4°F | Resp 17 | Ht 65.5 in | Wt 271.0 lb

## 2016-11-16 DIAGNOSIS — D649 Anemia, unspecified: Secondary | ICD-10-CM | POA: Diagnosis not present

## 2016-11-16 DIAGNOSIS — R509 Fever, unspecified: Secondary | ICD-10-CM

## 2016-11-16 DIAGNOSIS — R059 Cough, unspecified: Secondary | ICD-10-CM

## 2016-11-16 DIAGNOSIS — Z124 Encounter for screening for malignant neoplasm of cervix: Secondary | ICD-10-CM

## 2016-11-16 DIAGNOSIS — B977 Papillomavirus as the cause of diseases classified elsewhere: Secondary | ICD-10-CM | POA: Diagnosis not present

## 2016-11-16 DIAGNOSIS — G894 Chronic pain syndrome: Secondary | ICD-10-CM | POA: Diagnosis not present

## 2016-11-16 DIAGNOSIS — Z5181 Encounter for therapeutic drug level monitoring: Secondary | ICD-10-CM

## 2016-11-16 DIAGNOSIS — Z113 Encounter for screening for infections with a predominantly sexual mode of transmission: Secondary | ICD-10-CM

## 2016-11-16 DIAGNOSIS — N3000 Acute cystitis without hematuria: Secondary | ICD-10-CM

## 2016-11-16 DIAGNOSIS — I872 Venous insufficiency (chronic) (peripheral): Secondary | ICD-10-CM

## 2016-11-16 DIAGNOSIS — R05 Cough: Secondary | ICD-10-CM | POA: Diagnosis not present

## 2016-11-16 DIAGNOSIS — J209 Acute bronchitis, unspecified: Secondary | ICD-10-CM

## 2016-11-16 DIAGNOSIS — R87610 Atypical squamous cells of undetermined significance on cytologic smear of cervix (ASC-US): Secondary | ICD-10-CM

## 2016-11-16 LAB — POCT CBC
GRANULOCYTE PERCENT: 51.4 % (ref 37–80)
HCT, POC: 35.2 % — AB (ref 37.7–47.9)
HEMOGLOBIN: 12.1 g/dL — AB (ref 12.2–16.2)
Lymph, poc: 2.5 (ref 0.6–3.4)
MCH: 31.6 pg — AB (ref 27–31.2)
MCHC: 34.3 g/dL (ref 31.8–35.4)
MCV: 92.2 fL (ref 80–97)
MID (CBC): 1 — AB (ref 0–0.9)
MPV: 6.5 fL (ref 0–99.8)
POC Granulocyte: 3.7 (ref 2–6.9)
POC LYMPH PERCENT: 34.6 %L (ref 10–50)
POC MID %: 14 % — AB (ref 0–12)
Platelet Count, POC: 485 10*3/uL — AB (ref 142–424)
RBC: 3.82 M/uL — AB (ref 4.04–5.48)
RDW, POC: 15.2 %
WBC: 7.2 10*3/uL (ref 4.6–10.2)

## 2016-11-16 LAB — POCT WET + KOH PREP
TRICH BY WET PREP: ABSENT
Yeast by KOH: ABSENT
Yeast by wet prep: ABSENT

## 2016-11-16 LAB — POCT URINALYSIS DIP (MANUAL ENTRY)
BILIRUBIN UA: NEGATIVE
BILIRUBIN UA: NEGATIVE
GLUCOSE UA: NEGATIVE
Nitrite, UA: POSITIVE — AB
Protein Ur, POC: 30 — AB
RBC UA: NEGATIVE
SPEC GRAV UA: 1.015
Urobilinogen, UA: 0.2
pH, UA: 7

## 2016-11-16 MED ORDER — ALBUTEROL SULFATE HFA 108 (90 BASE) MCG/ACT IN AERS: 2.0000 | INHALATION_SPRAY | RESPIRATORY_TRACT | 1 refills | Status: DC | PRN

## 2016-11-16 MED ORDER — FLUCONAZOLE 150 MG PO TABS: 150.0000 mg | ORAL_TABLET | Freq: Once | ORAL | 0 refills | Status: AC

## 2016-11-16 MED ORDER — IPRATROPIUM BROMIDE 0.02 % IN SOLN
0.5000 mg | Freq: Once | RESPIRATORY_TRACT | Status: AC
  Administered 2016-11-16: 0.5 mg via RESPIRATORY_TRACT

## 2016-11-16 MED ORDER — ALBUTEROL SULFATE (2.5 MG/3ML) 0.083% IN NEBU
2.5000 mg | INHALATION_SOLUTION | Freq: Once | RESPIRATORY_TRACT | Status: AC
  Administered 2016-11-16: 2.5 mg via RESPIRATORY_TRACT

## 2016-11-16 MED ORDER — AMOXICILLIN-POT CLAVULANATE 875-125 MG PO TABS: 1.0000 | ORAL_TABLET | Freq: Two times a day (BID) | ORAL | 0 refills | Status: DC

## 2016-11-16 MED ORDER — HYDROCODONE-HOMATROPINE 5-1.5 MG PO TABS: 1.0000 | ORAL_TABLET | Freq: Three times a day (TID) | ORAL | 0 refills | Status: DC | PRN

## 2016-11-16 NOTE — Progress Notes (Addendum)
Subjective:  By signing my name below, I, [name], attest that this documentation has been prepared under the direction and in the presence of Norberto Sorenson, MD.  Electronically Signed: Andrew Au, ED Scribe. 11/16/2016. 2:32 PM.   Patient ID: [name], female    DOB: [birth year], 61 y.o.   MRN: [id number]  HPI Chief Complaint  Patient presents with  . Follow-up    Want a pap    HPI Comments: [name] is a 61 y.o. female who presents to the Primary Care at [facility] for follow up. Has been 18 months since last STD screen. She was seen by Gynecology, Harriett Sine, 1.5 years ago with normal pap but high risk HPV. However negative for type 18 or 16. Recommendation is to repeat co testing in 1 year. She denies vaginal discharge, bleeding, itching and odor.  She denies urinary symptoms.  Pt report past illness of cough and congestion. She tried tylenol cold and sinu with improvement of symptoms. Reports feeling better today. Pt would like flu shot.   Medication refill Pt would like refill of pain medication for aches and pains, primarily in left thumb due to arthritis and right knee pain from hx of knee replacement. She had weaned off pain medication and has not had any in a while. She has been seen by rheumatologist, Dr. Kellie Simmering.  She was never seen by pain management, stating she was never able to get an appointment. She has found Dr. Geraldine Contras, psychiatrist to be helpful. Dr. Kellie Simmering  Hx of anemia She takes iron daily  Patient Active Problem List   Diagnosis Date Noted  . Insomnia   . Chronic pain syndrome   . Cellulitis and abscess of leg 06/19/2016  . 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 Cellulitis of left lower extremity 01/09/2016  . Psoriatic arthritis (HCC)   . Cough   . Wheezing   . Depression 12/05/2015  . Chronic anemia 12/05/2015  . Chronic venous insufficiency 12/04/2015  . Alcohol abuse 11/21/2014  . Anxiety state 11/21/2014  . BMI 40.0-44.9, adult (HCC) 11/21/2014  . Epistaxis  09/24/2014   Past Medical History:  Diagnosis Date  . Anemia   . Arthritis   . Depression   . Fibromyalgia   . MRSA (methicillin resistant Staphylococcus aureus)   . Obese   . Seasonal allergies   . Sleep apnea 2012   took test in [place]-told to use cpap-could not ude   Past Surgical History:  Procedure Laterality Date  . ANTERIOR CERVICAL DECOMP/DISCECTOMY FUSION  05/09/2008   C5-6, C6-7; with arthrodesis also  . GASTRIC BYPASS  2002  . KNEE ARTHROSCOPY  2004   rt and lt knee  . ROTATOR CUFF REPAIR W/ DISTAL CLAVICLE EXCISION  01/06/2006   right  . SHOULDER ARTHROSCOPY  2003   rt  . SHOULDER ARTHROSCOPY  2003   rt  . TONSILLECTOMY    . TOTAL KNEE ARTHROPLASTY  03/29/2004   left  . TOTAL KNEE ARTHROPLASTY  11/07/2003   right  . TOTAL KNEE REVISION  07/30/2010   right; with lateral release   No Known Allergies Prior to Admission medications   Medication Sig Start Date End Date Taking? Authorizing Provider  acetaminophen (TYLENOL) 500 MG tablet Take 1,000 mg by mouth every 6 (six) hours as needed for moderate pain or headache.   Yes Historical Provider, MD  acyclovir ointment (ZOVIRAX) 5 % Apply topically every 3 (three) hours. 06/25/16  Yes Arvilla Market, DO  DULoxetine (CYMBALTA)  60 MG capsule Take 1 capsule (60 mg total) by mouth daily. 06/25/16  Yes Arvilla Market, DO  EPINEPHrine 0.3 mg/0.3 mL IJ SOAJ injection Inject 0.3 mLs (0.3 mg total) into the muscle once. Patient taking differently: Inject 0.3 mg into the muscle daily as needed (allergic reaction).  05/07/15  Yes Lorre Nick, MD  HYDROcodone-acetaminophen (NORCO/VICODIN) 5-325 MG tablet Take 1-2 tablets by mouth every 6 (six) hours as needed for severe pain. 06/25/16  Yes Joanna Puff, MD  sulfamethoxazole-trimethoprim (BACTRIM DS,SEPTRA DS) 800-160 MG tablet Take 1 tablet by mouth 2 (two) times daily. 06/25/16  Yes Arvilla Market, DO  traZODone (DESYREL) 50 MG tablet TAKE 50 MG BY MOUTH  DAILY AT BEDTIME AS NEEDED FOR SLEEP 12/11/14  Yes Historical Provider, MD  HYDROcodone-acetaminophen (NORCO) 7.5-325 MG tablet Take 1 tablet by mouth every 8 (eight) hours as needed for moderate pain. May fill 30 days after date written Patient not taking: Reported on 06/19/2016 04/05/16   Sherren Mocha, MD   Social History   Social History  . Marital status: Married    Spouse name: N/A  . Number of children: N/A  . Years of education: N/A   Occupational History  . Not on file.   Social History Main Topics  . Smoking status: Former Smoker    Quit date: 05/16/1981  . Smokeless tobacco: Never Used  . Alcohol use 3.6 oz/week    6 Cans of beer per week     Comment: 6 cans per day/everyday  . Drug use: No  . Sexual activity: Yes    Birth control/ protection: None     Comment: INTERCOURSE AGE 16, SEXUAL PARTNERS MORE THAN 5   Other Topics Concern  . Not on file   Social History Narrative  . No narrative on file   Review of Systems  Constitutional: Negative for chills and fever.  HENT: Positive for congestion.   Respiratory: Positive for cough.   Genitourinary: Negative for vaginal bleeding, vaginal discharge and vaginal pain.    Objective:   Physical Exam  Constitutional: She is oriented to person, place, and time. She appears well-developed and well-nourished. No distress.  HENT:  Head: Normocephalic.  Right Ear: Hearing, tympanic membrane, external ear and ear canal normal.  Left Ear: Hearing, tympanic membrane, external ear and ear canal normal.  Nose: Nose normal.  Mouth/Throat: Uvula is midline, oropharynx is clear and moist and mucous membranes are normal.  Neck: Neck supple. No thyromegaly present.  Cardiovascular: Normal rate, S1 normal and S2 normal.   No murmur heard. Pulmonary/Chest: Effort normal. She has wheezes ( expiratory wheeze at base). She has no rales.  Decreased air movement  Repeat exam: diffuse wheeze throughout and rhonchi in left lung.     Genitourinary:  Genitourinary Comments: Berry amount of thin opaque discharge with white green hue. Normal cervix but ? of Erythema at 6 o'clock in vaginal vault below cervix.  Lymphadenopathy:    She has no cervical adenopathy.  Neurological: She is alert and oriented to person, place, and time.  Skin: Skin is warm and dry.  Psychiatric: She has a normal mood and affect. Her behavior is normal.   Vitals:   11/16/16 1425  BP: 124/80  Pulse: 84  Resp: 17  Temp: 98.4 F (36.9 C)  TempSrc: Oral  SpO2: 97%  Weight: 271 lb (122.9 kg)  Height: 5' 5.5" (1.664 m)    Results for orders placed or performed in visit on 11/16/16  POCT CBC  Result Value Ref Range   WBC 7.2 4.6 - 10.2 K/uL   Lymph, poc 2.5 0.6 - 3.4   POC LYMPH PERCENT 34.6 10 - 50 %L   MID (cbc) 1.0 (A) 0 - 0.9   POC MID % 14.0 (A) 0 - 12 %M   POC Granulocyte 3.7 2 - 6.9   Granulocyte percent 51.4 37 - 80 %G   RBC 3.82 (A) 4.04 - 5.48 M/uL   Hemoglobin 12.1 (A) 12.2 - 16.2 g/dL   HCT, POC 16.1 (A) 09.6 - 47.9 %   MCV 92.2 80 - 97 fL   MCH, POC 31.6 (A) 27 - 31.2 pg   MCHC 34.3 31.8 - 35.4 g/dL   RDW, POC 04.5 %   Platelet Count, POC 485 (A) 142 - 424 K/uL   MPV 6.5 0 - 99.8 fL  POCT urinalysis dipstick  Result Value Ref Range   Color, UA yellow yellow   Clarity, UA clear clear   Glucose, UA negative negative   Bilirubin, UA negative negative   Ketones, POC UA negative negative   Spec Grav, UA 1.015    Blood, UA negative negative   pH, UA 7.0    Protein Ur, POC =30 (A) negative   Urobilinogen, UA 0.2    Nitrite, UA Positive (A) Negative   Leukocytes, UA Berry (1+) (A) Negative  POCT Wet + KOH Prep  Result Value Ref Range   Yeast by KOH Absent Absent   Yeast by wet prep Absent Absent   WBC by wet prep Few Few   Clue Cells Wet Prep HPF POC None None   Trich by wet prep Absent Absent   Bacteria Wet Prep HPF POC Moderate (A) Few   Epithelial Cells By [employer] (UMFC) Moderate (A) None, Few, Too numerous to  count   RBC,UR,HPF,POC None None RBC/hpf   Dg Chest 2 View  Result Date: 11/16/2016 CLINICAL DATA:  Cough and wheezing for several days EXAM: CHEST  2 VIEW COMPARISON:  06/19/2016 FINDINGS: The heart size and mediastinal contours are within normal limits. Both lungs are clear. The visualized skeletal structures show postsurgical changes in the cervical spine. Mild degenerative changes in thoracic spine are seen. IMPRESSION: No active cardiopulmonary disease. Electronically Signed   By: Alcide Clever M.D.   On: 11/16/2016 15:51  Some consolidation in the left lower lobe with bronchitic change.   Assessment & Plan:  Database reviewed: Vicodin from dentist, [facility] #30 at hospital discharge. Her psychiatrist has her on adderall XR 30-60mg  a day and now naltrexone .  1. Screening for cervical cancer   2. Cough   3. Chronic pain syndrome   4. Fever, unspecified fever cause   5. Chronic venous insufficiency   6. Anemia, unspecified type   7. Medication monitoring encounter   8. Hypocalcemia   9. Screening for STD (sexually transmitted disease)   10. HPV in female   49. Acute bronchitis, unspecified organism   12. Acute cystitis without hematuria    ADDENDUM:Will refer back to Maryelizabeth Rowan NP gyn who pt saw 2 yrs ago - at that time routine pap showed normal cytology with + HR HPV but neg type 16/18/45.  Rec repeat cotesting in 12 mos which showed ASCUS with - HR HPV so ASCCP guidelines now recommend colposcopy   Orders Placed This Encounter  Procedures  . Urine culture  . DG Chest 2 View    Standing Status:   Future  Number of Occurrences:   1    Standing Expiration Date:   11/16/2017    Order Specific Question:   Reason for Exam (SYMPTOM  OR DIAGNOSIS REQUIRED)    Answer:   wheezing and cough    Order Specific Question:   Is the patient pregnant?    Answer:   No    Order Specific Question:   Preferred imaging location?    Answer:   External  . Comprehensive metabolic panel  . Ferritin    . VITAMIN D 25 Hydroxy (Vit-D Deficiency, Fractures)  . VITAMIN D 25 Hydroxy (Vit-D Deficiency, Fractures)  . POCT CBC  . POCT urinalysis dipstick  . POCT Wet + KOH Prep    Meds ordered this encounter  Medications  . albuterol (PROVENTIL) (2.5 MG/3ML) 0.083% nebulizer solution 2.5 mg  . ipratropium (ATROVENT) nebulizer solution 0.5 mg  . DISCONTD: Hydrocodone-Homatropine 5-1.5 MG TABS    Sig: Take 1 tablet by mouth every 8 (eight) hours as needed. For cough and pain    Dispense:  30 each    Refill:  0  . amoxicillin-clavulanate (AUGMENTIN) 875-125 MG tablet    Sig: Take 1 tablet by mouth 2 (two) times daily.    Dispense:  10 tablet    Refill:  0  . fluconazole (DIFLUCAN) 150 MG tablet    Sig: Take 1 tablet (150 mg total) by mouth once. After antibiotic course    Dispense:  1 tablet    Refill:  0  . albuterol (PROVENTIL HFA;VENTOLIN HFA) 108 (90 Base) MCG/ACT inhaler    Sig: Inhale 2 puffs into the lungs every 4 (four) hours as needed for wheezing or shortness of breath (cough, shortness of breath or wheezing.).    Dispense:  1 Inhaler    Refill:  1   Over 40 min spent in face-to-face evaluation of and consultation with patient and coordination of care.  Over 50% of this time was spent counseling this patient.  I personally performed the services described in this documentation, which was scribed in my presence. The recorded information has been reviewed and considered, and addended by me as needed.   Norberto SorensonEva Arlyne Brandes, M.D.  [facility]   [address] [place], [place] [address] [phone] phone [phone] fax  11/25/16 4:57 PM

## 2016-11-16 NOTE — Patient Instructions (Addendum)
   IF you received an x-ray today, you will receive an invoice from [facility]. Please contact [facility] at [phone] with questions or concerns regarding your invoice.   IF you received labwork today, you will receive an invoice from [facility]. Please contact [facility] at [phone] with questions or concerns regarding your invoice.   Our billing staff will not be able to assist you with questions regarding bills from these companies.  You will be contacted with the lab results as soon as they are available. The fastest way to get your results is to activate your My Chart account. Instructions are located on the last page of this paperwork. If you have not heard from us regarding the results in 2 weeks, please contact this office.      Acute Bronchitis, Adult Acute bronchitis is sudden (acute) swelling of the air tubes (bronchi) in the lungs. Acute bronchitis causes these tubes to fill with mucus, which can make it hard to breathe. It can also cause coughing or wheezing. In adults, acute bronchitis usually goes away within 2 weeks. A cough caused by bronchitis may last up to 3 weeks. Smoking, allergies, and asthma can make the condition worse. Repeated episodes of bronchitis may cause further lung problems, such as chronic obstructive pulmonary disease (COPD). What are the causes? This condition can be caused by germs and by substances that irritate the lungs, including:  Cold and flu viruses. This condition is most often caused by the same virus that causes a cold.  Bacteria.  Exposure to tobacco smoke, dust, fumes, and air pollution.  What increases the risk? This condition is more likely to develop in people who:  Have close contact with someone with acute bronchitis.  Are exposed to lung irritants, such as tobacco smoke, dust, fumes, and vapors.  Have a weak immune system.  Have a respiratory condition such as asthma.  What are the signs or  symptoms? Symptoms of this condition include:  A cough.  Coughing up clear, yellow, or green mucus.  Wheezing.  Chest congestion.  Shortness of breath.  A fever.  Body aches.  Chills.  A sore throat.  How is this diagnosed? This condition is usually diagnosed with a physical exam. During the exam, your health care provider may order tests, such as chest X-rays, to rule out other conditions. He or she may also:  Test a sample of your mucus for bacterial infection.  Check the level of oxygen in your blood. This is done to check for pneumonia.  Do a chest X-ray or lung function testing to rule out pneumonia and other conditions.  Perform blood tests.  Your health care provider will also ask about your symptoms and medical history. How is this treated? Most cases of acute bronchitis clear up over time without treatment. Your health care provider may recommend:  Drinking more fluids. Drinking more makes your mucus thinner, which may make it easier to breathe.  Taking a medicine for a fever or cough.  Taking an antibiotic medicine.  Using an inhaler to help improve shortness of breath and to control a cough.  Using a cool mist vaporizer or humidifier to make it easier to breathe.  Follow these instructions at home: Medicines  Take over-the-counter and prescription medicines only as told by your health care provider.  If you were prescribed an antibiotic, take it as told by your health care provider. Do not stop taking the antibiotic even if you start to feel better. General instructions    Get plenty of rest.  Drink enough fluids to keep your urine clear or pale yellow.  Avoid smoking and secondhand smoke. Exposure to cigarette smoke or irritating chemicals will make bronchitis worse. If you smoke and you need help quitting, ask your health care provider. Quitting smoking will help your lungs heal faster.  Use an inhaler, cool mist vaporizer, or humidifier as told  by your health care provider.  Keep all follow-up visits as told by your health care provider. This is important. How is this prevented? To lower your risk of getting this condition again:  Wash your hands often with soap and water. If soap and water are not available, use hand sanitizer.  Avoid contact with people who have cold symptoms.  Try not to touch your hands to your mouth, nose, or eyes.  Make sure to get the flu shot every year.  Contact a health care provider if:  Your symptoms do not improve in 2 weeks of treatment. Get help right away if:  You cough up blood.  You have chest pain.  You have severe shortness of breath.  You become dehydrated.  You faint or keep feeling like you are going to faint.  You keep vomiting.  You have a severe headache.  Your fever or chills gets worse. This information is not intended to replace advice given to you by your health care provider. Make sure you discuss any questions you have with your health care provider. Document Released: 11/17/2004 Document Revised: 05/04/2016 Document Reviewed: 03/30/2016 Elsevier Interactive Patient Education  2017 [employer]  

## 2016-11-17 ENCOUNTER — Telehealth: Payer: Self-pay | Admitting: Cardiovascular Disease

## 2016-11-17 ENCOUNTER — Telehealth: Payer: Self-pay

## 2016-11-17 LAB — COMPREHENSIVE METABOLIC PANEL
ALBUMIN: 4.7 g/dL (ref 3.5–5.5)
ALT: 13 IU/L (ref 0–32)
AST: 25 IU/L (ref 0–40)
Albumin/Globulin Ratio: 2.1 (ref 1.2–2.2)
Alkaline Phosphatase: 82 IU/L (ref 39–117)
BUN / CREAT RATIO: 15 (ref 9–23)
BUN: 17 mg/dL (ref 6–24)
Bilirubin Total: 0.2 mg/dL (ref 0.0–1.2)
CALCIUM: 9.3 mg/dL (ref 8.7–10.2)
CO2: 25 mmol/L (ref 18–29)
Chloride: 100 mmol/L (ref 96–106)
Creatinine, Ser: 1.16 mg/dL — ABNORMAL HIGH (ref 0.57–1.00)
GFR calc Af Amer: 60 mL/min/{1.73_m2} (ref >59)
GFR, EST NON AFRICAN AMERICAN: 52 mL/min/{1.73_m2} — AB (ref >59)
GLOBULIN, TOTAL: 2.2 g/dL (ref 1.5–4.5)
GLUCOSE: 102 mg/dL — AB (ref 65–99)
Potassium: 4.7 mmol/L (ref 3.5–5.2)
SODIUM: 142 mmol/L (ref 134–144)
Total Protein: 6.9 g/dL (ref 6.0–8.5)

## 2016-11-17 LAB — FERRITIN: FERRITIN: 69 ng/mL (ref 15–150)

## 2016-11-17 LAB — VITAMIN D 25 HYDROXY (VIT D DEFICIENCY, FRACTURES): VIT D 25 HYDROXY: 29.3 ng/mL — AB (ref 30.0–100.0)

## 2016-11-17 MED ORDER — ACETAMINOPHEN-CODEINE #3 300-30 MG PO TABS: 1.0000 | ORAL_TABLET | Freq: Three times a day (TID) | ORAL | 0 refills | Status: DC | PRN

## 2016-11-17 MED ORDER — BENZONATATE 200 MG PO CAPS
200.0000 mg | ORAL_CAPSULE | Freq: Three times a day (TID) | ORAL | 0 refills | Status: DC | PRN
Start: 2016-11-17 — End: 2019-02-13

## 2016-11-17 NOTE — Telephone Encounter (Signed)
Tessalon and tylenol #3 sent to pharmacy.

## 2016-11-17 NOTE — Telephone Encounter (Signed)
Still has written rx.  wants substitute med or" two separate meds hydrocodone and cough med" Please advise

## 2016-11-17 NOTE — Addendum Note (Signed)
Addended by: Norberto SorensonSHAW, Alvaro Aungst on: 11/17/2016 07:17 PM   Modules accepted: Orders

## 2016-11-17 NOTE — Telephone Encounter (Signed)
This lady called, her husband is a pt of Dr. Mariah MillingGollan. She states Dr Mariah MillingGollan told her she could have a "heart [facility] [place]". Please advise of what kind of appointment this pt needs.

## 2016-11-17 NOTE — Telephone Encounter (Signed)
No answer. Left message to call back.   

## 2016-11-17 NOTE — Telephone Encounter (Signed)
Pt is calling and says she is having a hard time getting her prescription filled due to pharmacies not having it she was prescribed hydro codone for her cough and pain but she is wondering if she could have two separate prescriptions because shes been to 3 pharmacies and none have it    She was up all night coughing  Please advise  [phone]

## 2016-11-18 LAB — URINE CULTURE

## 2016-11-18 NOTE — Telephone Encounter (Signed)
Left message scripts sent to pharmacy

## 2016-11-21 LAB — PAP IG, CT-NG NAA, HPV HIGH-RISK
CHLAMYDIA, NUC. ACID AMP: NEGATIVE
GONOCOCCUS BY NUCLEIC ACID AMP: NEGATIVE
HPV, high-risk: NEGATIVE
PAP Smear Comment: 0

## 2016-11-25 ENCOUNTER — Other Ambulatory Visit: Payer: Self-pay

## 2016-11-25 DIAGNOSIS — Z8249 Family history of ischemic heart disease and other diseases of the circulatory system: Secondary | ICD-10-CM

## 2016-11-25 NOTE — Telephone Encounter (Signed)
Patient given number to schedule [facility].  [facility].

## 2017-02-27 ENCOUNTER — Other Ambulatory Visit: Payer: Self-pay | Admitting: Family Medicine

## 2017-02-28 ENCOUNTER — Encounter: Payer: Self-pay | Admitting: Family Medicine

## 2017-02-28 NOTE — Addendum Note (Signed)
Addended by: Norberto SorensonSHAW, Arienna Benegas on: 02/28/2017 05:15 PM   Modules accepted: Orders

## 2017-02-28 NOTE — Telephone Encounter (Signed)
Needs appointment for refill on fluconazole. No prior vaginal yeast infections documented with KOH in Epic seen on brief review. If symptoms are not severe enough for patient to come in, she may try OTC Monistat.  I have put in a referral back to gynecology for her pap smear results (see lab) so she can be evaluated for yeast infxn at that appt.  Attempted to call pt to explain - no answer at either #. Cell VM was full and no identifiers on home # answering machine so no message left.

## 2017-03-15 ENCOUNTER — Encounter: Payer: Self-pay | Admitting: Radiology

## 2017-04-03 DIAGNOSIS — F900 Attention-deficit hyperactivity disorder, predominantly inattentive type: Secondary | ICD-10-CM | POA: Insufficient documentation

## 2017-04-12 ENCOUNTER — Encounter: Payer: Self-pay | Admitting: Women's Health

## 2017-04-12 ENCOUNTER — Ambulatory Visit (INDEPENDENT_AMBULATORY_CARE_PROVIDER_SITE_OTHER): Payer: BC Managed Care – PPO | Admitting: Women's Health

## 2017-04-12 VITALS — BP 130/90 | Ht 66.0 in | Wt 278.4 lb

## 2017-04-12 DIAGNOSIS — Z01419 Encounter for gynecological examination (general) (routine) without abnormal findings: Secondary | ICD-10-CM | POA: Diagnosis not present

## 2017-04-12 NOTE — Patient Instructions (Signed)
Dr Celine Ahr  [phone] colonoscopy   Medroxyprogesterone injection [Contraceptive] What is this medicine? MEDROXYPROGESTERONE (me DROX ee proe JES te rone) contraceptive injections prevent pregnancy. They provide effective birth control for 3 months. Depo-subQ Provera 104 is also used for treating pain related to endometriosis. This medicine may be used for other purposes; ask your health care provider or pharmacist if you have questions. COMMON BRAND NAME(S): Depo-Provera, Depo-subQ Provera 104 What should I tell my health care provider before I take this medicine? They need to know if you have any of these conditions: -frequently drink alcohol -asthma -blood vessel disease or a history of a blood clot in the lungs or legs -bone disease such as osteoporosis -breast cancer -diabetes -eating disorder (anorexia nervosa or bulimia) -high blood pressure -HIV infection or AIDS -kidney disease -liver disease -mental depression -migraine -seizures (convulsions) -stroke -tobacco smoker -vaginal bleeding -an unusual or allergic reaction to medroxyprogesterone, other hormones, medicines, foods, dyes, or preservatives -pregnant or trying to get pregnant -breast-feeding How should I use this medicine? Depo-Provera Contraceptive injection is given into a muscle. Depo-subQ Provera 104 injection is given under the skin. These injections are given by a health care professional. You must not be pregnant before getting an injection. The injection is usually given during the first 5 days after the start of a menstrual period or 6 weeks after delivery of a baby. Talk to your pediatrician regarding the use of this medicine in children. Special care may be needed. These injections have been used in female children who have started having menstrual periods. Overdosage: If you think you have taken too much of this medicine contact a poison control center or emergency room at once. NOTE: This medicine  is only for you. Do not share this medicine with others. What if I miss a dose? Try not to miss a dose. You must get an injection once every 3 months to maintain birth control. If you cannot keep an appointment, call and reschedule it. If you wait longer than 13 weeks between Depo-Provera contraceptive injections or longer than 14 weeks between Depo-subQ Provera 104 injections, you could get pregnant. Use another method for birth control if you miss your appointment. You may also need a pregnancy test before receiving another injection. What may interact with this medicine? Do not take this medicine with any of the following medications: -bosentan This medicine may also interact with the following medications: -aminoglutethimide -antibiotics or medicines for infections, especially rifampin, rifabutin, rifapentine, and griseofulvin -aprepitant -barbiturate medicines such as phenobarbital or primidone -bexarotene -carbamazepine -medicines for seizures like ethotoin, felbamate, oxcarbazepine, phenytoin, topiramate -modafinil -St. John's wort This list may not describe all possible interactions. Give your health care provider a list of all the medicines, herbs, non-prescription drugs, or dietary supplements you use. Also tell them if you smoke, drink alcohol, or use illegal drugs. Some items may interact with your medicine. What should I watch for while using this medicine? This drug does not protect you against HIV infection (AIDS) or other sexually transmitted diseases. Use of this product may cause you to lose calcium from your bones. Loss of calcium may cause weak bones (osteoporosis). Only use this product for more than 2 years if other forms of birth control are not right for you. The longer you use this product for birth control the more likely you will be at risk for weak bones. Ask your health care professional how you can keep strong bones. You may have a change in bleeding  pattern or  irregular periods. Many females stop having periods while taking this drug. If you have received your injections on time, your chance of being pregnant is very low. If you think you may be pregnant, see your health care professional as soon as possible. Tell your health care professional if you want to get pregnant within the next year. The effect of this medicine may last a long time after you get your last injection. What side effects may I notice from receiving this medicine? Side effects that you should report to your doctor or health care professional as soon as possible: -allergic reactions like skin rash, itching or hives, swelling of the face, lips, or tongue -breast tenderness or discharge -breathing problems -changes in vision -depression -feeling faint or lightheaded, falls -fever -pain in the abdomen, chest, groin, or leg -problems with balance, talking, walking -unusually weak or tired -yellowing of the eyes or skin Side effects that usually do not require medical attention (report to your doctor or health care professional if they continue or are bothersome): -acne -fluid retention and swelling -headache -irregular periods, spotting, or absent periods -temporary pain, itching, or skin reaction at site where injected -weight gain This list may not describe all possible side effects. Call your doctor for medical advice about side effects. You may report side effects to FDA at 1-800-FDA-1088. Where should I keep my medicine? This does not apply. The injection will be given to you by a health care professional. NOTE: This sheet is a summary. It may not cover all possible information. If you have questions about this medicine, talk to your doctor, pharmacist, or health care provider.  2018 Elsevier/Gold Standard (2008-10-31 18:37:56)  Health Maintenance for Postmenopausal Women Menopause is a normal process in which your reproductive ability comes to an end. This process  happens gradually over a span of months to years, usually between the ages of 20 and 66. Menopause is complete when you have missed 12 consecutive menstrual periods. It is important to talk with your health care provider about some of the most common conditions that affect postmenopausal women, such as heart disease, cancer, and bone loss (osteoporosis). Adopting a healthy lifestyle and getting preventive care can help to promote your health and wellness. Those actions can also lower your chances of developing some of these common conditions. What should I know about menopause? During menopause, you may experience a number of symptoms, such as:  Moderate-to-severe hot flashes.  Night sweats.  Decrease in sex drive.  Mood swings.  Headaches.  Tiredness.  Irritability.  Memory problems.  Insomnia.  Choosing to treat or not to treat menopausal changes is an individual decision that you make with your health care provider. What should I know about hormone replacement therapy and supplements? Hormone therapy products are effective for treating symptoms that are associated with menopause, such as hot flashes and night sweats. Hormone replacement carries certain risks, especially as you become older. If you are thinking about using estrogen or estrogen with progestin treatments, discuss the benefits and risks with your health care provider. What should I know about heart disease and stroke? Heart disease, heart attack, and stroke become more likely as you age. This may be due, in part, to the hormonal changes that your body experiences during menopause. These can affect how your body processes dietary fats, triglycerides, and cholesterol. Heart attack and stroke are both medical emergencies. There are many things that you can do to help prevent heart disease and stroke:  Have your blood pressure checked at least every 1-2 years. High blood pressure causes heart disease and increases the risk  of stroke.  If you are 35-32 years old, ask your health care provider if you should take aspirin to prevent a heart attack or a stroke.  Do not use any tobacco products, including cigarettes, chewing tobacco, or electronic cigarettes. If you need help quitting, ask your health care provider.  It is important to eat a healthy diet and maintain a healthy weight. ? Be sure to include plenty of vegetables, fruits, low-fat dairy products, and lean protein. ? Avoid eating foods that are high in solid fats, added sugars, or salt (sodium).  Get regular exercise. This is one of the most important things that you can do for your health. ? Try to exercise for at least 150 minutes each week. The type of exercise that you do should increase your heart rate and make you sweat. This is known as moderate-intensity exercise. ? Try to do strengthening exercises at least twice each week. Do these in addition to the moderate-intensity exercise.  Know your numbers.Ask your health care provider to check your cholesterol and your blood glucose. Continue to have your blood tested as directed by your health care provider.  What should I know about cancer screening? There are several types of cancer. Take the following steps to reduce your risk and to catch any cancer development as early as possible. Breast Cancer  Practice breast self-awareness. ? This means understanding how your breasts normally appear and feel. ? It also means doing regular breast self-exams. Let your health care provider know about any changes, no matter how small.  If you are 29 or older, have a clinician do a breast exam (clinical breast exam or CBE) every year. Depending on your age, family history, and medical history, it may be recommended that you also have a yearly breast X-ray (mammogram).  If you have a family history of breast cancer, talk with your health care provider about genetic screening.  If you are at high risk for breast  cancer, talk with your health care provider about having an MRI and a mammogram every year.  Breast cancer (BRCA) gene test is recommended for women who have family members with BRCA-related cancers. Results of the assessment will determine the need for genetic counseling and BRCA1 and for BRCA2 testing. BRCA-related cancers include these types: ? Breast. This occurs in males or females. ? Ovarian. ? Tubal. This may also be called fallopian tube cancer. ? Cancer of the abdominal or pelvic lining (peritoneal cancer). ? Prostate. ? Pancreatic.  Cervical, Uterine, and Ovarian Cancer Your health care provider may recommend that you be screened regularly for cancer of the pelvic organs. These include your ovaries, uterus, and vagina. This screening involves a pelvic exam, which includes checking for microscopic changes to the surface of your cervix (Pap test).  For women ages 21-65, health care providers may recommend a pelvic exam and a Pap test every three years. For women ages 82-65, they may recommend the Pap test and pelvic exam, combined with testing for human papilloma virus (HPV), every five years. Some types of HPV increase your risk of cervical cancer. Testing for HPV may also be done on women of any age who have unclear Pap test results.  Other health care providers may not recommend any screening for nonpregnant women who are considered low risk for pelvic cancer and have no symptoms. Ask your health care provider if  a screening pelvic exam is right for you.  If you have had past treatment for cervical cancer or a condition that could lead to cancer, you need Pap tests and screening for cancer for at least 20 years after your treatment. If Pap tests have been discontinued for you, your risk factors (such as having a new sexual partner) need to be reassessed to determine if you should start having screenings again. Some women have medical problems that increase the chance of getting cervical  cancer. In these cases, your health care provider may recommend that you have screening and Pap tests more often.  If you have a family history of uterine cancer or ovarian cancer, talk with your health care provider about genetic screening.  If you have vaginal bleeding after reaching menopause, tell your health care provider.  There are currently no reliable tests available to screen for ovarian cancer.  Lung Cancer Lung cancer screening is recommended for adults 61-6 years old who are at high risk for lung cancer because of a history of smoking. A yearly low-dose CT scan of the lungs is recommended if you:  Currently smoke.  Have a history of at least 30 pack-years of smoking and you currently smoke or have quit within the past 15 years. A pack-year is smoking an average of one pack of cigarettes per day for one year.  Yearly screening should:  Continue until it has been 15 years since you quit.  Stop if you develop a health problem that would prevent you from having lung cancer treatment.  Colorectal Cancer  This type of cancer can be detected and can often be prevented.  Routine colorectal cancer screening usually begins at age 52 and continues through age 9.  If you have risk factors for colon cancer, your health care provider may recommend that you be screened at an earlier age.  If you have a family history of colorectal cancer, talk with your health care provider about genetic screening.  Your health care provider may also recommend using home test kits to check for hidden blood in your stool.  A small camera at the end of a tube can be used to examine your colon directly (sigmoidoscopy or colonoscopy). This is done to check for the earliest forms of colorectal cancer.  Direct examination of the colon should be repeated every 5-10 years until age 59. However, if early forms of precancerous polyps or small growths are found or if you have a family history or genetic risk  for colorectal cancer, you may need to be screened more often.  Skin Cancer  Check your skin from head to toe regularly.  Monitor any moles. Be sure to tell your health care provider: ? About any new moles or changes in moles, especially if there is a change in a mole's shape or color. ? If you have a mole that is larger than the size of a pencil eraser.  If any of your family members has a history of skin cancer, especially at a young age, talk with your health care provider about genetic screening.  Always use sunscreen. Apply sunscreen liberally and repeatedly throughout the day.  Whenever you are outside, protect yourself by wearing long sleeves, pants, a wide-brimmed hat, and sunglasses.  What should I know about osteoporosis? Osteoporosis is a condition in which bone destruction happens more quickly than new bone creation. After menopause, you may be at an increased risk for osteoporosis. To help prevent osteoporosis or the bone fractures  that can happen because of osteoporosis, the following is recommended:  If you are 69-59 years old, get at least 1,000 mg of calcium and at least 600 mg of vitamin D per day.  If you are older than age 39 but younger than age 28, get at least 1,200 mg of calcium and at least 600 mg of vitamin D per day.  If you are older than age 66, get at least 1,200 mg of calcium and at least 800 mg of vitamin D per day.  Smoking and excessive alcohol intake increase the risk of osteoporosis. Eat foods that are rich in calcium and vitamin D, and do weight-bearing exercises several times each week as directed by your health care provider. What should I know about how menopause affects my mental health? Depression may occur at any age, but it is more common as you become older. Common symptoms of depression include:  Low or sad mood.  Changes in sleep patterns.  Changes in appetite or eating patterns.  Feeling an overall lack of motivation or enjoyment of  activities that you previously enjoyed.  Frequent crying spells.  Talk with your health care provider if you think that you are experiencing depression. What should I know about immunizations? It is important that you get and maintain your immunizations. These include:  Tetanus, diphtheria, and pertussis (Tdap) booster vaccine.  Influenza every year before the flu season begins.  Pneumonia vaccine.  Shingles vaccine.  Your health care provider may also recommend other immunizations. This information is not intended to replace advice given to you by your health care provider. Make sure you discuss any questions you have with your health care provider. Document Released: 12/02/2005 Document Revised: 04/29/2016 Document Reviewed: 07/14/2015 Elsevier Interactive Patient Education  2018 [employer].

## 2017-04-12 NOTE — Progress Notes (Signed)
[name] T Helder [birth year] [id number]    History:    Presents for annual exam.  Presents to get reestablished. 10/2016 Pap ascus with negative HR HPV in at primary care. Mammogram overdue. Has not had a screening colonoscopy. History of psoriatic arthritis, depression, alcohol abuse, cellulitis. Hoarding problem, unable to move around in home, husband stays in the guesthouse on the property.  Past medical history, past surgical history, family history and social history were all reviewed and documented in the EPIC chart. 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 Custody of a 60 year old girl [name] who has severe autism, no verbal's skills and questions how to prevent cycles.   ROS:  A ROS was performed and pertinent positives and negatives are included.  Exam:  Vitals:   04/12/17 1139  BP: 130/90  Weight: 278 lb 6.4 oz (126.3 kg)  Height: 5\' 6"  (1.676 m)   Body mass index is 44.93 kg/m.   General appearance:  Normal Thyroid:  Symmetrical, normal in size, without palpable masses or nodularity. Respiratory  Auscultation:  Clear without wheezing or rhonchi Cardiovascular  Auscultation:  Regular rate, without rubs, murmurs or gallops  Edema/varicosities:  Not grossly evident Abdominal  Soft,nontender, without masses, guarding or rebound.  Liver/spleen:  No organomegaly noted  Hernia:  None appreciated  Skin  Inspection:  Grossly normal   Breasts: Examined lying and sitting.     Right: Without masses, retractions, discharge or axillary adenopathy.     Left: Without masses, retractions, discharge or axillary adenopathy. Gentitourinary   Inguinal/mons:  Normal without inguinal adenopathy  External genitalia:  Normal  BUS/Urethra/Skene's glands:  Normal  Vagina:  Normal  Cervix:  Normal  Uterus:  normal in size, shape and contour.  Midline and mobile  Adnexa/parametria:     Rt: Without masses or tenderness.   Lt: Without masses or tenderness.  Anus and  perineum: Normal  Digital rectal exam: Normal sphincter tone without palpated masses or tenderness  Assessment/Plan:  61 y.o. MWF G0 +3 stepchildren and Stephanie  for annual exam.    Postmenopausal on no HRT with no bleeding Anxiety and depression/hoarding-psychiatrist Obesity Primary care manages labs  Plan: Raynelle FanningJulie with name and number given reviewed importance of counseling along with medication. SBE's, exercise, calcium rich diet, vitamin D 2000 daily encouraged. Overdue for mammogram has [facility] in the past and number given instructed to schedule. Reviewed importance of screening colonoscopy, [facility] information given instructed to schedule. Aware of need to increase exercise and decrease calories for weight loss, reports drinking less beer and is hoping to continue to decrease amount daily. Discussed options for menses sensation for [name] who has severe autism, will follow-up with her primary care for probable Depo-Provera.    Harrington Challengerancy J Zacharey Jensen [facility]construct[facility] SurgeryWHNP, 1:00 PM 04/12/2017

## 2017-04-14 ENCOUNTER — Telehealth: Payer: Self-pay | Admitting: *Deleted

## 2017-04-14 NOTE — Telephone Encounter (Signed)
Pt called requesting ultram 50 mg tablet due to some back pain from working in garden yesterday. I explained to patient that nancy is out of the office today and won't return until Monday 04/17/17. Best to see PCP Dr. Clelia CroftShaw pt said she doesn't see her anymore, I explained she could always go to urgent care for x-ray as well. Pt declined both and said she will try OTC and rest.

## 2017-04-27 ENCOUNTER — Telehealth (INDEPENDENT_AMBULATORY_CARE_PROVIDER_SITE_OTHER): Payer: Self-pay | Admitting: Family

## 2017-04-27 NOTE — Telephone Encounter (Signed)
Wrong chart. TG 

## 2017-04-27 NOTE — Telephone Encounter (Signed)
°  Who's calling (name and relationship to patient) : [name] (pt) Best contact number: [phone] or [phone](cell) Provider they see: [facility] Reason for call: Left voice message on Friday, July 3rd at 2:41pm.  Want Mrs [name] to call her before her appt this Friday.  Need to ask some questions.    PRESCRIPTION REFILL ONLY  Name of prescription:  Pharmacy:

## 2017-06-08 ENCOUNTER — Inpatient Hospital Stay (HOSPITAL_COMMUNITY)
Admission: EM | Admit: 2017-06-08 | Discharge: 2017-06-15 | DRG: 871 | Disposition: A | Discharge status: Home / Self Care | Payer: BC Managed Care – PPO | Attending: Internal Medicine | Admitting: Internal Medicine

## 2017-06-08 ENCOUNTER — Encounter (HOSPITAL_COMMUNITY): Payer: Self-pay | Admitting: Emergency Medicine

## 2017-06-08 DIAGNOSIS — N179 Acute kidney failure, unspecified: Secondary | ICD-10-CM | POA: Diagnosis present

## 2017-06-08 DIAGNOSIS — R1032 Left lower quadrant pain: Secondary | ICD-10-CM | POA: Diagnosis not present

## 2017-06-08 DIAGNOSIS — A409 Streptococcal sepsis, unspecified: Secondary | ICD-10-CM | POA: Diagnosis not present

## 2017-06-08 DIAGNOSIS — J181 Lobar pneumonia, unspecified organism: Secondary | ICD-10-CM

## 2017-06-08 DIAGNOSIS — Z79899 Other long term (current) drug therapy: Secondary | ICD-10-CM

## 2017-06-08 DIAGNOSIS — G894 Chronic pain syndrome: Secondary | ICD-10-CM | POA: Diagnosis present

## 2017-06-08 DIAGNOSIS — Z96653 Presence of artificial knee joint, bilateral: Secondary | ICD-10-CM | POA: Diagnosis present

## 2017-06-08 DIAGNOSIS — E669 Obesity, unspecified: Secondary | ICD-10-CM | POA: Diagnosis present

## 2017-06-08 DIAGNOSIS — G4733 Obstructive sleep apnea (adult) (pediatric): Secondary | ICD-10-CM | POA: Diagnosis present

## 2017-06-08 DIAGNOSIS — A419 Sepsis, unspecified organism: Secondary | ICD-10-CM | POA: Diagnosis present

## 2017-06-08 DIAGNOSIS — J9601 Acute respiratory failure with hypoxia: Secondary | ICD-10-CM | POA: Diagnosis present

## 2017-06-08 DIAGNOSIS — M797 Fibromyalgia: Secondary | ICD-10-CM | POA: Diagnosis present

## 2017-06-08 DIAGNOSIS — Z6841 Body Mass Index (BMI) 40.0 and over, adult: Secondary | ICD-10-CM

## 2017-06-08 DIAGNOSIS — E876 Hypokalemia: Secondary | ICD-10-CM | POA: Diagnosis present

## 2017-06-08 DIAGNOSIS — J302 Other seasonal allergic rhinitis: Secondary | ICD-10-CM | POA: Diagnosis present

## 2017-06-08 DIAGNOSIS — J189 Pneumonia, unspecified organism: Secondary | ICD-10-CM | POA: Diagnosis present

## 2017-06-08 DIAGNOSIS — R0603 Acute respiratory distress: Secondary | ICD-10-CM

## 2017-06-08 DIAGNOSIS — Z981 Arthrodesis status: Secondary | ICD-10-CM

## 2017-06-08 DIAGNOSIS — Z9884 Bariatric surgery status: Secondary | ICD-10-CM

## 2017-06-08 DIAGNOSIS — N183 Chronic kidney disease, stage 3 (moderate): Secondary | ICD-10-CM | POA: Diagnosis present

## 2017-06-08 DIAGNOSIS — Z87891 Personal history of nicotine dependence: Secondary | ICD-10-CM

## 2017-06-08 DIAGNOSIS — R0600 Dyspnea, unspecified: Secondary | ICD-10-CM

## 2017-06-08 DIAGNOSIS — F329 Major depressive disorder, single episode, unspecified: Secondary | ICD-10-CM | POA: Diagnosis present

## 2017-06-08 DIAGNOSIS — J154 Pneumonia due to other streptococci: Secondary | ICD-10-CM | POA: Diagnosis present

## 2017-06-08 DIAGNOSIS — F1010 Alcohol abuse, uncomplicated: Secondary | ICD-10-CM | POA: Diagnosis present

## 2017-06-08 DIAGNOSIS — R197 Diarrhea, unspecified: Secondary | ICD-10-CM

## 2017-06-08 DIAGNOSIS — D649 Anemia, unspecified: Secondary | ICD-10-CM | POA: Diagnosis present

## 2017-06-08 DIAGNOSIS — N390 Urinary tract infection, site not specified: Secondary | ICD-10-CM | POA: Diagnosis present

## 2017-06-08 DIAGNOSIS — F32A Depression, unspecified: Secondary | ICD-10-CM | POA: Diagnosis present

## 2017-06-08 HISTORY — DX: Pneumonia, unspecified organism: J18.9

## 2017-06-08 LAB — COMPREHENSIVE METABOLIC PANEL
ALT: 22 U/L (ref 14–54)
AST: 40 U/L (ref 15–41)
Albumin: 3.6 g/dL (ref 3.5–5.0)
Alkaline Phosphatase: 86 U/L (ref 38–126)
Anion gap: 10 (ref 5–15)
BILIRUBIN TOTAL: 0.5 mg/dL (ref 0.3–1.2)
BUN: 48 mg/dL — AB (ref 6–20)
CO2: 24 mmol/L (ref 22–32)
Calcium: 8.3 mg/dL — ABNORMAL LOW (ref 8.9–10.3)
Chloride: 102 mmol/L (ref 101–111)
Creatinine, Ser: 1.94 mg/dL — ABNORMAL HIGH (ref 0.44–1.00)
GFR calc Af Amer: 31 mL/min — ABNORMAL LOW (ref >60)
GFR, EST NON AFRICAN AMERICAN: 27 mL/min — AB (ref >60)
Glucose, Bld: 100 mg/dL — ABNORMAL HIGH (ref 65–99)
POTASSIUM: 3.3 mmol/L — AB (ref 3.5–5.1)
Sodium: 136 mmol/L (ref 135–145)
Total Protein: 6.7 g/dL (ref 6.5–8.1)

## 2017-06-08 LAB — CBC
HCT: 34.7 % — ABNORMAL LOW (ref 36.0–46.0)
HEMOGLOBIN: 11.5 g/dL — AB (ref 12.0–15.0)
MCH: 30.8 pg (ref 26.0–34.0)
MCHC: 33.1 g/dL (ref 30.0–36.0)
MCV: 93 fL (ref 78.0–100.0)
Platelets: 299 10*3/uL (ref 150–400)
RBC: 3.73 MIL/uL — ABNORMAL LOW (ref 3.87–5.11)
RDW: 14.5 % (ref 11.5–15.5)
WBC: 25.4 10*3/uL — ABNORMAL HIGH (ref 4.0–10.5)

## 2017-06-08 LAB — LIPASE, BLOOD: Lipase: 18 U/L (ref 11–51)

## 2017-06-08 MED ORDER — SODIUM CHLORIDE 0.9 % IV BOLUS (SEPSIS)
1000.0000 mL | Freq: Once | INTRAVENOUS | Status: AC
  Administered 2017-06-09: 1000 mL via INTRAVENOUS

## 2017-06-08 MED ORDER — FENTANYL CITRATE (PF) 100 MCG/2ML IJ SOLN: 50.0000 ug | Freq: Once | INTRAMUSCULAR | Status: DC

## 2017-06-08 MED ORDER — HYDROMORPHONE HCL 1 MG/ML IJ SOLN
1.0000 mg | Freq: Once | INTRAMUSCULAR | Status: AC
  Administered 2017-06-09: 1 mg via INTRAVENOUS
  Filled 2017-06-08: qty 1

## 2017-06-08 MED ORDER — IOPAMIDOL (ISOVUE-300) INJECTION 61%
INTRAVENOUS | Status: AC
  Administered 2017-06-08
  Filled 2017-06-08: qty 30

## 2017-06-08 MED ORDER — ONDANSETRON HCL 4 MG/2ML IJ SOLN
4.0000 mg | Freq: Once | INTRAMUSCULAR | Status: AC
  Administered 2017-06-09: 4 mg via INTRAVENOUS
  Filled 2017-06-08: qty 2

## 2017-06-08 NOTE — ED Triage Notes (Signed)
Patient c/o lower abdominal pain, fatigue and diarrhea x2 days. Denies N/V. Ambulatory to triage.

## 2017-06-08 NOTE — ED Provider Notes (Signed)
[facility]-EMERGENCY DEPT Provider Note   CSN: [id number] Arrival date & time: 06/08/17  1909    History   Chief Complaint Chief Complaint  Patient presents with  . Abdominal Pain  . Diarrhea    HPI [name] is a 61 y.o. female.  61 year old female presents to the emergency department for abdominal pain. [name] states that pain is located in her left lower abdomen. It has been fairly constant and worsening. Symptom onset was 2 days ago. [name] has noted associated fatigue as well as watery diarrhea. [name] has been taking Imodium for this with some improvement in stooling amount. [name] developed nausea on arrival to the emergency department. [name] has not had any vomiting. Fever noted prior to arrival yesterday up to 101.31F. [name] denies any fevers since this morning. [name] did take Excedrin earlier today. Abdominal surgical history significant for gastric bypass in 2002.      Past Medical History:  Diagnosis Date  . Anemia   . Arthritis   . Depression   . Fibromyalgia   . MRSA (methicillin resistant Staphylococcus aureus)   . Obese   . Seasonal allergies   . Sleep apnea 2012   took test in [place]-told to use cpap-could not ude    Patient Active Problem List   Diagnosis Date Noted  . CAP (community acquired pneumonia) 06/09/2017  . Insomnia   . Chronic pain syndrome   . Cellulitis and abscess of leg 06/19/2016  . 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 Cellulitis of left lower extremity 01/09/2016  . Psoriatic arthritis (HCC)   . Cough   . Wheezing   . Depression 12/05/2015  . Chronic anemia 12/05/2015  . Chronic venous insufficiency 12/04/2015  . Alcohol abuse 11/21/2014  . Anxiety state 11/21/2014  . BMI 40.0-44.9, adult (HCC) 11/21/2014  . Epistaxis 09/24/2014    Past Surgical History:  Procedure Laterality Date  . ANTERIOR CERVICAL DECOMP/DISCECTOMY FUSION  05/09/2008   C5-6, C6-7; with arthrodesis also  . GASTRIC BYPASS  2002  . KNEE ARTHROSCOPY  2004   rt and lt knee  . ROTATOR  CUFF REPAIR W/ DISTAL CLAVICLE EXCISION  01/06/2006   right  . SHOULDER ARTHROSCOPY  2003   rt  . SHOULDER ARTHROSCOPY  2003   rt  . TONSILLECTOMY    . TOTAL KNEE ARTHROPLASTY  03/29/2004   left  . TOTAL KNEE ARTHROPLASTY  11/07/2003   right  . TOTAL KNEE REVISION  07/30/2010   right; with lateral release    OB History    Gravida Para Term Preterm AB Living   0 0 0 0 0 0   SAB TAB Ectopic Multiple Live Births   0 0 0 0         Home Medications    Prior to Admission medications   Medication Sig Start Date End Date Taking? Authorizing Provider  acetaminophen (TYLENOL) 500 MG tablet Take 1,000 mg by mouth every 6 (six) hours as needed for moderate pain or headache.   Yes [provider]  albuterol (PROVENTIL HFA;VENTOLIN HFA) 108 (90 Base) MCG/ACT inhaler Inhale 2 puffs into the lungs every 4 (four) hours as needed for wheezing or shortness of breath (cough, shortness of breath or wheezing.). 11/16/16  Yes Sherren MochaShaw, Eva N, MD  amphetamine-dextroamphetamine (ADDERALL XR) 30 MG 24 hr capsule Take 30 mg by mouth daily.   Yes [provider]  DULoxetine (CYMBALTA) 60 MG capsule Take 1 capsule (60 mg total) by mouth daily. 06/25/16  Yes Arvilla MarketWallace, Catherine Lauren,  DO  EPINEPHrine 0.3 mg/0.3 mL IJ SOAJ injection Inject 0.3 mLs (0.3 mg total) into the muscle once. Patient taking differently: Inject 0.3 mg into the muscle daily as needed (allergic reaction).  05/07/15  Yes Lorre Nick, MD  FLUoxetine (PROZAC) 20 MG tablet Take 20 mg by mouth daily.   Yes [provider]  traZODone (DESYREL) 50 MG tablet TAKE 50 MG BY MOUTH DAILY AT BEDTIME AS NEEDED FOR SLEEP 12/11/14  Yes [provider]  acetaminophen-codeine (TYLENOL #3) 300-30 MG tablet Take 1 tablet by mouth every 8 (eight) hours as needed for moderate pain. Patient not taking: Reported on 04/12/2017 11/17/16   Sherren Mocha, MD  acyclovir ointment (ZOVIRAX) 5 % Apply topically every 3 (three) hours. Patient not  taking: Reported on 06/08/2017 06/25/16   Arvilla Market, DO  amoxicillin-clavulanate (AUGMENTIN) 875-125 MG tablet Take 1 tablet by mouth 2 (two) times daily. Patient not taking: Reported on 04/12/2017 11/16/16   Sherren Mocha, MD  benzonatate (TESSALON) 200 MG capsule Take 1 capsule (200 mg total) by mouth 3 (three) times daily as needed for cough. Patient not taking: Reported on 04/12/2017 11/17/16   Sherren Mocha, MD    Family History Family History  Problem Relation Age of Onset  . Heart disease Mother   . Cancer Father   . Heart disease Maternal Grandfather   . Alcoholism Maternal Grandfather   . Heart attack Brother     Social History Social History  Substance Use Topics  . Smoking status: Former Smoker    Quit date: 05/16/1981  . Smokeless tobacco: Never Used  . Alcohol use 3.6 oz/week    6 Cans of beer per week     Comment: 6 cans per day/everyday     Allergies   Patient has no known allergies.   Review of Systems Review of Systems Ten systems reviewed and are negative for acute change, except as noted in the HPI.    Physical Exam Updated Vital Signs BP 118/70 (BP Location: Left Wrist)   Pulse 96   Temp 99.7 F (37.6 C) (Oral)   Resp 17   SpO2 (!) 85%   Physical Exam  Constitutional: [name] is oriented to person, place, and time. [name] appears well-developed and well-nourished. No distress.  Nontoxic and in no acute distress. Appears uncomfortable.  HENT:  Head: Normocephalic and atraumatic.  Eyes: Conjunctivae and EOM are normal. No scleral icterus.  Neck: Normal range of motion.  Cardiovascular: Normal rate, normal heart sounds and intact distal pulses.   Patient not tachycardic as noted in triage  Pulmonary/Chest: Effort normal. No respiratory distress. [name] has no wheezes. [name] has no rales.  Respirations even and unlabored  Abdominal:  Abdomen soft and obese. There is tenderness in the left upper and left lower quadrants. No masses or involuntary  guarding. Exam limited secondary to body habitus.  Musculoskeletal: Normal range of motion.  Neurological: [name] is alert and oriented to person, place, and time. [name] exhibits normal muscle tone. Coordination normal.  Skin: Skin is warm and dry. No rash noted. [name] is not diaphoretic. No erythema. No pallor.  Psychiatric: [name] has a normal mood and affect. Her behavior is normal.  Nursing note and vitals reviewed.    ED Treatments / Results  Labs (all labs ordered are listed, but only abnormal results are displayed) Labs Reviewed  COMPREHENSIVE METABOLIC PANEL - Abnormal; Notable for the following:       Result Value   Potassium 3.3 (*)  Glucose, Bld 100 (*)    BUN 48 (*)    Creatinine, Ser 1.94 (*)    Calcium 8.3 (*)    GFR calc non Af Amer 27 (*)    GFR calc Af Amer 31 (*)    All other components within normal limits  CBC - Abnormal; Notable for the following:    WBC 25.4 (*)    RBC 3.73 (*)    Hemoglobin 11.5 (*)    HCT 34.7 (*)    All other components within normal limits  GASTROINTESTINAL PANEL BY PCR, STOOL (REPLACES STOOL CULTURE)  CULTURE, BLOOD (ROUTINE X 2)  CULTURE, BLOOD (ROUTINE X 2)  CULTURE, EXPECTORATED SPUTUM-ASSESSMENT  GRAM STAIN  LIPASE, BLOOD  URINALYSIS, ROUTINE W REFLEX MICROSCOPIC  STREP PNEUMONIAE URINARY ANTIGEN  LACTIC ACID, PLASMA  LACTIC ACID, PLASMA  PROCALCITONIN  CBC  BASIC METABOLIC PANEL  I-STAT CG4 LACTIC ACID, ED    EKG  EKG Interpretation None       Radiology Ct Abdomen Pelvis Wo Contrast  Result Date: 06/09/2017 CLINICAL DATA:  Lower abdominal pain, fatigue and diarrhea EXAM: CT ABDOMEN AND PELVIS WITHOUT CONTRAST TECHNIQUE: Multidetector CT imaging of the abdomen and pelvis was performed following the standard protocol without IV contrast. COMPARISON:  None. FINDINGS: Lower chest: Dense left lower lobe consolidation. No significant pleural effusion. Patchy minimal ground-glass density in the right lower lobe. 5 mm right lower  lobe subpleural pulmonary nodule, series 3, image number 12. Normal heart size. Hepatobiliary: No focal liver abnormality is seen. No gallstones, gallbladder wall thickening, or biliary dilatation. Pancreas: Unremarkable. No pancreatic ductal dilatation or surrounding inflammatory changes. Spleen: Normal in size without focal abnormality. Adrenals/Urinary Tract: Adrenal glands are unremarkable. Kidneys are normal, without renal calculi, focal lesion, or hydronephrosis. Bladder is unremarkable. Stomach/Bowel: Status post gastric bypass. Negative for small bowel obstruction. No significant colon wall thickening. Vascular/Lymphatic: Aortic atherosclerosis. No enlarged abdominal or pelvic lymph nodes. Reproductive: No adnexal masses. Partially calcified uterine mass presumably a fibroid. Other: Negative for free air or free fluid. Musculoskeletal: Diffuse degenerative changes. Trace anterolisthesis of L4 on L5. IMPRESSION: 1. Dense left lower lobe consolidation is suspicious for a pneumonia. Additional patchy minimal ground-glass density in the right lower lobe may reflect additional small foci of infection/inflammation. 2. 5 mm right lower lobe subpleural pulmonary nodule. No follow-up needed if patient is low-risk. Non-contrast chest CT can be considered in 12 months if patient is high-risk. This recommendation follows the consensus statement: Guidelines for Management of Incidental Pulmonary Nodules Detected on CT Images: From the Fleischner Society 2017; Radiology 2017; 284:228-243. 3. Negative for bowel obstruction or bowel wall thickening. Postsurgical changes consistent with prior gastric bypass 4. Calcified uterine mass likely a fibroid Electronically Signed   By: Jasmine Pang M.D.   On: 06/09/2017 01:37    Procedures Procedures (including critical care time)  Medications Ordered in ED Medications  iopamidol (ISOVUE-300) 61 % injection (not administered)  famotidine (PEPCID) IVPB 20 mg premix (20 mg  Intravenous New Bag/Given 06/09/17 0248)  cefTRIAXone (ROCEPHIN) 1 g in dextrose 5 % 50 mL IVPB (1 g Intravenous New Bag/Given 06/09/17 0247)  azithromycin (ZITHROMAX) 500 mg in dextrose 5 % 250 mL IVPB (not administered)  cefTRIAXone (ROCEPHIN) 1 g in dextrose 5 % 50 mL IVPB (not administered)  azithromycin (ZITHROMAX) 500 mg in dextrose 5 % 250 mL IVPB (not administered)  FLUoxetine (PROZAC) tablet 20 mg (not administered)  DULoxetine (CYMBALTA) DR capsule 60 mg (not administered)  traZODone (DESYREL) tablet  50 mg (not administered)  enoxaparin (LOVENOX) injection 40 mg (not administered)  0.9 %  sodium chloride infusion (not administered)  ondansetron (ZOFRAN) tablet 4 mg (not administered)    Or  ondansetron (ZOFRAN) injection 4 mg (not administered)  acetaminophen (TYLENOL) tablet 650 mg (not administered)    Or  acetaminophen (TYLENOL) suppository 650 mg (not administered)  guaiFENesin (MUCINEX) 12 hr tablet 600 mg (not administered)  potassium chloride SA (K-DUR,KLOR-CON) CR tablet 40 mEq (not administered)  sodium chloride 0.9 % bolus 1,000 mL (0 mLs Intravenous Stopped 06/09/17 0235)  ondansetron (ZOFRAN) injection 4 mg (4 mg Intravenous Given 06/09/17 0031)  HYDROmorphone (DILAUDID) injection 1 mg (1 mg Intravenous Given 06/09/17 0031)  gi cocktail (Maalox,Lidocaine,Donnatal) (30 mLs Oral Given 06/09/17 0247)  acetaminophen (TYLENOL) tablet 650 mg (650 mg Oral Given 06/09/17 0247)  benzonatate (TESSALON) capsule 200 mg (200 mg Oral Given 06/09/17 0247)  ipratropium-albuterol (DUONEB) 0.5-2.5 (3) MG/3ML nebulizer solution 3 mL (3 mLs Nebulization Given 06/09/17 0246)     Initial Impression / Assessment and Plan / ED Course  I have reviewed the triage vital signs and the nursing notes.  Pertinent labs & imaging results that were available during my care of the patient were reviewed by me and considered in my medical decision making (see chart for details).     61 year old female  presents to the emergency room pertinent for abdominal pain and diarrhea. Symptoms associated with a fever of 101F 2 days ago. Patient with reproducible tenderness in her upper and lower left abdomen. Initially, diverticulitis favored, but CT found to be reassuring without evidence of abdominal/pelvic process.   CT today did reveal a left lower lobe pneumonia. This corresponds with a leukocytosis of 25.4. Patient also with an acute kidney injury. Creatinine elevated to 1.94. BUN has almost tripled since January. On repeat assessment, patient states that [name] has had an increased cough. This is productive of sputum.community-acquired pneumonia consistent with history of recent fever. Patient progressively hypoxic over ED course on room air. Plan to admit for IV antibiotics and fluid hydration. DuoNeb ordered for wheeze. Case discussed with Dr. Katrinka Blazing who will admit.   Final Clinical Impressions(s) / ED Diagnoses   Final diagnoses:  Community acquired pneumonia of left lower lobe of lung (HCC)  AKI (acute kidney injury) (HCC)  Diarrhea, unspecified type  Left lower quadrant pain    New Prescriptions New Prescriptions   No medications on file     Antony Madura, PA-C 06/09/17 0318    Melene Plan, DO 06/09/17 0401

## 2017-06-08 NOTE — ED Notes (Signed)
Pt went to restroom but was unaware that a sample was needed.  Pt will try again soon.

## 2017-06-09 ENCOUNTER — Emergency Department (HOSPITAL_COMMUNITY): Payer: BC Managed Care – PPO

## 2017-06-09 ENCOUNTER — Encounter (HOSPITAL_COMMUNITY): Payer: Self-pay

## 2017-06-09 DIAGNOSIS — J13 Pneumonia due to Streptococcus pneumoniae: Secondary | ICD-10-CM

## 2017-06-09 DIAGNOSIS — R197 Diarrhea, unspecified: Secondary | ICD-10-CM

## 2017-06-09 DIAGNOSIS — G894 Chronic pain syndrome: Secondary | ICD-10-CM | POA: Diagnosis not present

## 2017-06-09 DIAGNOSIS — F419 Anxiety disorder, unspecified: Secondary | ICD-10-CM

## 2017-06-09 DIAGNOSIS — N179 Acute kidney failure, unspecified: Secondary | ICD-10-CM | POA: Diagnosis present

## 2017-06-09 DIAGNOSIS — J189 Pneumonia, unspecified organism: Secondary | ICD-10-CM | POA: Diagnosis present

## 2017-06-09 DIAGNOSIS — F329 Major depressive disorder, single episode, unspecified: Secondary | ICD-10-CM | POA: Diagnosis not present

## 2017-06-09 DIAGNOSIS — J181 Lobar pneumonia, unspecified organism: Secondary | ICD-10-CM | POA: Diagnosis not present

## 2017-06-09 DIAGNOSIS — A419 Sepsis, unspecified organism: Secondary | ICD-10-CM | POA: Diagnosis not present

## 2017-06-09 DIAGNOSIS — F411 Generalized anxiety disorder: Secondary | ICD-10-CM | POA: Diagnosis not present

## 2017-06-09 DIAGNOSIS — D649 Anemia, unspecified: Secondary | ICD-10-CM | POA: Diagnosis not present

## 2017-06-09 DIAGNOSIS — F1010 Alcohol abuse, uncomplicated: Secondary | ICD-10-CM | POA: Diagnosis not present

## 2017-06-09 LAB — EXPECTORATED SPUTUM ASSESSMENT W REFEX TO RESP CULTURE

## 2017-06-09 LAB — URINALYSIS, ROUTINE W REFLEX MICROSCOPIC
Bilirubin Urine: NEGATIVE
Glucose, UA: NEGATIVE mg/dL
Ketones, ur: NEGATIVE mg/dL
Nitrite: NEGATIVE
PROTEIN: 30 mg/dL — AB
SPECIFIC GRAVITY, URINE: 1.01 (ref 1.005–1.030)
pH: 5 (ref 5.0–8.0)

## 2017-06-09 LAB — LACTIC ACID, PLASMA
LACTIC ACID, VENOUS: 1.8 mmol/L (ref 0.5–1.9)
Lactic Acid, Venous: 1.7 mmol/L (ref 0.5–1.9)

## 2017-06-09 LAB — CBC
HCT: 33.2 % — ABNORMAL LOW (ref 36.0–46.0)
HEMOGLOBIN: 11 g/dL — AB (ref 12.0–15.0)
MCH: 30.8 pg (ref 26.0–34.0)
MCHC: 33.1 g/dL (ref 30.0–36.0)
MCV: 93 fL (ref 78.0–100.0)
Platelets: 232 10*3/uL (ref 150–400)
RBC: 3.57 MIL/uL — ABNORMAL LOW (ref 3.87–5.11)
RDW: 14.5 % (ref 11.5–15.5)
WBC: 22.6 10*3/uL — ABNORMAL HIGH (ref 4.0–10.5)

## 2017-06-09 LAB — STREP PNEUMONIAE URINARY ANTIGEN: Strep Pneumo Urinary Antigen: POSITIVE — AB

## 2017-06-09 LAB — BASIC METABOLIC PANEL
Anion gap: 10 (ref 5–15)
BUN: 45 mg/dL — AB (ref 6–20)
CHLORIDE: 104 mmol/L (ref 101–111)
CO2: 21 mmol/L — ABNORMAL LOW (ref 22–32)
CREATININE: 1.68 mg/dL — AB (ref 0.44–1.00)
Calcium: 7.8 mg/dL — ABNORMAL LOW (ref 8.9–10.3)
GFR calc Af Amer: 37 mL/min — ABNORMAL LOW (ref >60)
GFR calc non Af Amer: 32 mL/min — ABNORMAL LOW (ref >60)
Glucose, Bld: 139 mg/dL — ABNORMAL HIGH (ref 65–99)
Potassium: 3.2 mmol/L — ABNORMAL LOW (ref 3.5–5.1)
SODIUM: 135 mmol/L (ref 135–145)

## 2017-06-09 LAB — MRSA PCR SCREENING: MRSA by PCR: NEGATIVE

## 2017-06-09 LAB — EXPECTORATED SPUTUM ASSESSMENT W GRAM STAIN, RFLX TO RESP C

## 2017-06-09 LAB — I-STAT CG4 LACTIC ACID, ED: LACTIC ACID, VENOUS: 1.56 mmol/L (ref 0.5–1.9)

## 2017-06-09 LAB — PROCALCITONIN: Procalcitonin: 20.22 ng/mL

## 2017-06-09 MED ORDER — IPRATROPIUM-ALBUTEROL 0.5-2.5 (3) MG/3ML IN SOLN
3.0000 mL | Freq: Once | RESPIRATORY_TRACT | Status: AC
  Administered 2017-06-09: 3 mL via RESPIRATORY_TRACT
  Filled 2017-06-09: qty 3

## 2017-06-09 MED ORDER — GI COCKTAIL ~~LOC~~
30.0000 mL | Freq: Once | ORAL | Status: AC
  Administered 2017-06-09: 30 mL via ORAL
  Filled 2017-06-09: qty 30

## 2017-06-09 MED ORDER — LORAZEPAM 2 MG/ML IJ SOLN
1.0000 mg | Freq: Four times a day (QID) | INTRAMUSCULAR | Status: AC | PRN
  Administered 2017-06-11 (×2): 1 mg via INTRAVENOUS
  Filled 2017-06-09 (×2): qty 1

## 2017-06-09 MED ORDER — FOLIC ACID 1 MG PO TABS
1.0000 mg | ORAL_TABLET | Freq: Every day | ORAL | Status: DC
  Administered 2017-06-09 – 2017-06-15 (×7): 1 mg via ORAL
  Filled 2017-06-09 (×7): qty 1

## 2017-06-09 MED ORDER — LIP MEDEX EX OINT
TOPICAL_OINTMENT | CUTANEOUS | Status: DC | PRN
  Filled 2017-06-09: qty 7

## 2017-06-09 MED ORDER — ADULT MULTIVITAMIN W/MINERALS CH
1.0000 | ORAL_TABLET | Freq: Every day | ORAL | Status: DC
  Administered 2017-06-09 – 2017-06-15 (×7): 1 via ORAL
  Filled 2017-06-09 (×7): qty 1

## 2017-06-09 MED ORDER — VITAMIN B-1 100 MG PO TABS
100.0000 mg | ORAL_TABLET | Freq: Every day | ORAL | Status: DC
  Administered 2017-06-09 – 2017-06-15 (×7): 100 mg via ORAL
  Filled 2017-06-09 (×7): qty 1

## 2017-06-09 MED ORDER — POTASSIUM CHLORIDE CRYS ER 20 MEQ PO TBCR
40.0000 meq | EXTENDED_RELEASE_TABLET | ORAL | Status: AC
  Administered 2017-06-09: 40 meq via ORAL
  Filled 2017-06-09: qty 2

## 2017-06-09 MED ORDER — FLUOXETINE HCL 20 MG PO CAPS
20.0000 mg | ORAL_CAPSULE | Freq: Every day | ORAL | Status: DC
  Administered 2017-06-09 – 2017-06-15 (×7): 20 mg via ORAL
  Filled 2017-06-09 (×8): qty 1

## 2017-06-09 MED ORDER — ACETAMINOPHEN 325 MG PO TABS
650.0000 mg | ORAL_TABLET | Freq: Once | ORAL | Status: AC
  Administered 2017-06-09: 650 mg via ORAL
  Filled 2017-06-09: qty 2

## 2017-06-09 MED ORDER — ONDANSETRON HCL 4 MG/2ML IJ SOLN: 4.0000 mg | Freq: Four times a day (QID) | INTRAMUSCULAR | Status: DC | PRN

## 2017-06-09 MED ORDER — SODIUM CHLORIDE 0.9 % IV SOLN
INTRAVENOUS | Status: DC
  Administered 2017-06-09 (×2): via INTRAVENOUS

## 2017-06-09 MED ORDER — ENOXAPARIN SODIUM 40 MG/0.4ML ~~LOC~~ SOLN
40.0000 mg | SUBCUTANEOUS | Status: DC
  Administered 2017-06-09 – 2017-06-15 (×7): 40 mg via SUBCUTANEOUS
  Filled 2017-06-09 (×8): qty 0.4

## 2017-06-09 MED ORDER — THIAMINE HCL 100 MG/ML IJ SOLN: 100.0000 mg | Freq: Every day | INTRAMUSCULAR | Status: DC

## 2017-06-09 MED ORDER — AZITHROMYCIN 500 MG IV SOLR
500.0000 mg | Freq: Once | INTRAVENOUS | Status: AC
  Administered 2017-06-09 (×2): 500 mg via INTRAVENOUS
  Filled 2017-06-09 (×2): qty 500

## 2017-06-09 MED ORDER — CEFTRIAXONE SODIUM 1 G IJ SOLR
1.0000 g | Freq: Once | INTRAMUSCULAR | Status: AC
  Administered 2017-06-09: 1 g via INTRAVENOUS
  Filled 2017-06-09: qty 10

## 2017-06-09 MED ORDER — TRAZODONE HCL 50 MG PO TABS
50.0000 mg | ORAL_TABLET | Freq: Every evening | ORAL | Status: DC | PRN
Start: 2017-06-09 — End: 2017-06-15
  Administered 2017-06-09 – 2017-06-14 (×4): 50 mg via ORAL
  Filled 2017-06-09 (×4): qty 1

## 2017-06-09 MED ORDER — BENZONATATE 100 MG PO CAPS
200.0000 mg | ORAL_CAPSULE | Freq: Once | ORAL | Status: AC
  Administered 2017-06-09: 200 mg via ORAL
  Filled 2017-06-09: qty 2

## 2017-06-09 MED ORDER — GUAIFENESIN ER 600 MG PO TB12
600.0000 mg | ORAL_TABLET | Freq: Two times a day (BID) | ORAL | Status: DC
  Administered 2017-06-09 – 2017-06-15 (×14): 600 mg via ORAL
  Filled 2017-06-09 (×14): qty 1

## 2017-06-09 MED ORDER — ACETAMINOPHEN 325 MG PO TABS
650.0000 mg | ORAL_TABLET | Freq: Four times a day (QID) | ORAL | Status: DC | PRN
  Administered 2017-06-09 – 2017-06-14 (×8): 650 mg via ORAL
  Filled 2017-06-09 (×9): qty 2

## 2017-06-09 MED ORDER — LORAZEPAM 1 MG PO TABS
1.0000 mg | ORAL_TABLET | Freq: Four times a day (QID) | ORAL | Status: AC | PRN
  Administered 2017-06-09: 1 mg via ORAL
  Filled 2017-06-09 (×2): qty 1

## 2017-06-09 MED ORDER — DULOXETINE HCL 30 MG PO CPEP
60.0000 mg | ORAL_CAPSULE | Freq: Every day | ORAL | Status: DC
  Administered 2017-06-09 – 2017-06-15 (×7): 60 mg via ORAL
  Filled 2017-06-09 (×7): qty 2

## 2017-06-09 MED ORDER — DEXTROSE 5 % IV SOLN
1.0000 g | INTRAVENOUS | Status: DC
  Administered 2017-06-09 – 2017-06-14 (×6): 1 g via INTRAVENOUS
  Filled 2017-06-09 (×7): qty 10

## 2017-06-09 MED ORDER — FAMOTIDINE IN NACL 20-0.9 MG/50ML-% IV SOLN
20.0000 mg | Freq: Once | INTRAVENOUS | Status: AC
  Administered 2017-06-09: 20 mg via INTRAVENOUS
  Filled 2017-06-09: qty 50

## 2017-06-09 MED ORDER — DEXTROSE 5 % IV SOLN
500.0000 mg | INTRAVENOUS | Status: DC
  Administered 2017-06-09: 500 mg via INTRAVENOUS
  Filled 2017-06-09 (×2): qty 500

## 2017-06-09 MED ORDER — ONDANSETRON HCL 4 MG PO TABS: 4.0000 mg | ORAL_TABLET | Freq: Four times a day (QID) | ORAL | Status: DC | PRN

## 2017-06-09 MED ORDER — ACETAMINOPHEN 650 MG RE SUPP: 650.0000 mg | Freq: Four times a day (QID) | RECTAL | Status: DC | PRN

## 2017-06-09 MED ORDER — IPRATROPIUM-ALBUTEROL 0.5-2.5 (3) MG/3ML IN SOLN
3.0000 mL | RESPIRATORY_TRACT | Status: DC | PRN
  Administered 2017-06-10: 3 mL via RESPIRATORY_TRACT
  Filled 2017-06-09: qty 3

## 2017-06-09 NOTE — Progress Notes (Signed)
Received report from the morning nurse. Pts assessment is unchanged from this morning. Will continue to monitor.

## 2017-06-09 NOTE — Progress Notes (Signed)
PHARMACY NOTE -  ANTIBIOTIC RENAL DOSE ADJUSTMENT   Request received for Pharmacy to assist with antibiotic renal dose adjustment.  Patient has been initiated on Azithromycin 500mg  iv q24hr, Ceftriaxone 1gm iv q24hr    for CAP. SCr 1.94, estimated CrCl 35 ml/min Current dosage is appropriate and need for further dosage adjustment appears unlikely at present. Will sign off at this time.  Please reconsult if a change in clinical status warrants re-evaluation of dosage.

## 2017-06-09 NOTE — Care Management Note (Signed)
Case Management Note  Patient Details  Name: CHELCI HAUGHNEY MRN: [id number] Date of Birth: Mar 07, 1956  Subjective/Objective:     Diverticulitis, pna               Action/Plan: Date:  June 09, 2017 Chart reviewed for concurrent status and case management needs. Will continue to follow patient progress. Discharge Planning: following for needs Expected discharge date: [id number] Marcelle Smiling, BSN, [place], [place]   [phone]  Expected Discharge Date:                  Expected Discharge Plan:  Home/Self Care  In-House Referral:     Discharge planning Services  CM Consult  Post Acute Care Choice:    Choice offered to:     DME Arranged:    DME Agency:     HH Arranged:    HH Agency:     Status of Service:  In process, will continue to follow  If discussed at Long Length of Stay Meetings, dates discussed:    Additional Comments:  Golda Acre, RN 06/09/2017, 8:38 AM

## 2017-06-09 NOTE — H&P (Addendum)
History and Physical    [name] BMS:[id number] DOB: [birth year] DOA: 06/08/2017  Referring MD/NP/PA: Antony Madura, PA-C PCP: Sherren Mocha, MD  Patient coming from: Home   Chief Complaint: Left sided abdominal pain   HPI: [name] is a 61 y.o. female with medical history significant of anxiety, depression, obesity s/p gastric bypass, and chronic pain, OSA not using CPAP; who presents with complaints of left-sided abdominal pain for the last 2 days. Patient reports  pain symptoms started all of a sudden and have been constant in the left upper quadrant abdomen. Palpation over the area reproduces sharp pain. Taking a deep breath also seems to worsen symptoms. Associated symptoms include fever up to 101F, generalized malaise, diarrhea, shortness of breath, and productive cough with brown sputum. Patient reports taking Tylenol for the fever and Imodium for diarrhea with improvement of symptoms. Denies having any significant chest pain, lightheadedness, palpitations, change in weight, leg swelling, recent antibiotics, or vomiting. Patient reports drinking 1- 2 beers per day on average. She denies any history of withdrawals   ED Course: Upon admission to the emergency department patient was seen to have a temperature of 99.36F, pulse 96-102, respirations 17-21, blood pressure maintained and O2 saturation maintained on room air. Labs revealed WBC 25.4, hemoglobin 11.5, potassium 3.3, BUN 48, creatinine 1.94, and lipase within normal limits.  A CT scan of the abdomen was obtained given concern for diverticulitis or intra-abdominal cause of symptoms. CT scan revealed signs of the lower lobe pneumonia. Patient was started on empiric antibiotics of ceftriaxone and azithromycin.  Review of Systems: Review of Systems  Constitutional: Positive for fever and malaise/fatigue.  HENT: Negative for ear discharge and ear pain.   Eyes: Negative for photophobia and pain.  Respiratory: Positive for sputum  production, shortness of breath and wheezing.   Cardiovascular: Negative for chest pain, palpitations and leg swelling.  Gastrointestinal: Positive for abdominal pain, diarrhea and nausea. Negative for vomiting.  Genitourinary: Negative for frequency and urgency.  Musculoskeletal: Positive for myalgias. Negative for falls.  Skin: Negative for itching and rash.       Positive for dog scratch  Neurological: Negative for sensory change and speech change.  Endo/Heme/Allergies: Negative for polydipsia. Bruises/bleeds easily.  Psychiatric/Behavioral: Positive for substance abuse. Negative for hallucinations.    Past Medical History:  Diagnosis Date  . Anemia   . Arthritis   . Depression   . Fibromyalgia   . MRSA (methicillin resistant Staphylococcus aureus)   . Obese   . Seasonal allergies   . Sleep apnea 2012   took test in [place]-told to use cpap-could not ude    Past Surgical History:  Procedure Laterality Date  . ANTERIOR CERVICAL DECOMP/DISCECTOMY FUSION  05/09/2008   C5-6, C6-7; with arthrodesis also  . GASTRIC BYPASS  2002  . KNEE ARTHROSCOPY  2004   rt and lt knee  . ROTATOR CUFF REPAIR W/ DISTAL CLAVICLE EXCISION  01/06/2006   right  . SHOULDER ARTHROSCOPY  2003   rt  . SHOULDER ARTHROSCOPY  2003   rt  . TONSILLECTOMY    . TOTAL KNEE ARTHROPLASTY  03/29/2004   left  . TOTAL KNEE ARTHROPLASTY  11/07/2003   right  . TOTAL KNEE REVISION  07/30/2010   right; with lateral release     reports that she quit smoking about 36 years ago. She has never used smokeless tobacco. She reports that she drinks about 3.6 oz of alcohol per week . She reports that she  does not use drugs.  No Known Allergies  Family History  Problem Relation Age of Onset  . Heart disease Mother   . Cancer Father   . Heart disease Maternal Grandfather   . Alcoholism Maternal Grandfather   . Heart attack Brother     Prior to Admission medications   Medication Sig Start Date End Date Taking?  Authorizing Provider  acetaminophen (TYLENOL) 500 MG tablet Take 1,000 mg by mouth every 6 (six) hours as needed for moderate pain or headache.   Yes [provider]  albuterol (PROVENTIL HFA;VENTOLIN HFA) 108 (90 Base) MCG/ACT inhaler Inhale 2 puffs into the lungs every 4 (four) hours as needed for wheezing or shortness of breath (cough, shortness of breath or wheezing.). 11/16/16  Yes Sherren Mocha, MD  amphetamine-dextroamphetamine (ADDERALL XR) 30 MG 24 hr capsule Take 30 mg by mouth daily.   Yes [provider]  DULoxetine (CYMBALTA) 60 MG capsule Take 1 capsule (60 mg total) by mouth daily. 06/25/16  Yes Arvilla Market, DO  EPINEPHrine 0.3 mg/0.3 mL IJ SOAJ injection Inject 0.3 mLs (0.3 mg total) into the muscle once. Patient taking differently: Inject 0.3 mg into the muscle daily as needed (allergic reaction).  05/07/15  Yes Lorre Nick, MD  FLUoxetine (PROZAC) 20 MG tablet Take 20 mg by mouth daily.   Yes [provider]  traZODone (DESYREL) 50 MG tablet TAKE 50 MG BY MOUTH DAILY AT BEDTIME AS NEEDED FOR SLEEP 12/11/14  Yes [provider]  acetaminophen-codeine (TYLENOL #3) 300-30 MG tablet Take 1 tablet by mouth every 8 (eight) hours as needed for moderate pain. Patient not taking: Reported on 04/12/2017 11/17/16   Sherren Mocha, MD  acyclovir ointment (ZOVIRAX) 5 % Apply topically every 3 (three) hours. Patient not taking: Reported on 06/08/2017 06/25/16   Arvilla Market, DO  amoxicillin-clavulanate (AUGMENTIN) 875-125 MG tablet Take 1 tablet by mouth 2 (two) times daily. Patient not taking: Reported on 04/12/2017 11/16/16   Sherren Mocha, MD  benzonatate (TESSALON) 200 MG capsule Take 1 capsule (200 mg total) by mouth 3 (three) times daily as needed for cough. Patient not taking: Reported on 04/12/2017 11/17/16   Sherren Mocha, MD    Physical Exam:  Constitutional: Obese female who appears acutely sick Vitals:   06/08/17 1922 06/08/17 2341    BP: 114/60 130/67  Pulse: (!) 102 (!) 102  Resp: 20 (!) 21  Temp: 99.7 F (37.6 C)   TempSrc: Oral   SpO2: 95% 91%   Eyes: PERRL, lids and conjunctivae normal ENMT: Mucous membranes are dry. Posterior pharynx clear of any exudate or lesions.  Neck: normal, supple, no masses, no thyromegaly Respiratory: Mildly tachypneic with positive rhonchi appreciated on the left mid to lower lung field. Cardiovascular: Regular rate and rhythm, no murmurs / rubs / gallops. No extremity edema. 2+ pedal pulses. No carotid bruits.  Abdomen: no tenderness, no masses palpated. No hepatosplenomegaly. Bowel sounds positive.  Musculoskeletal: no clubbing / cyanosis. No joint deformity upper and lower extremities. Good ROM, no contractures. Normal muscle tone.  Skin: Healing dog scratch from the inner part of the upper left arm down. No significant erythema noted.  Neurologic: CN 2-12 grossly intact. Sensation intact, DTR normal. Strength 5/5 in all 4.  Psychiatric: Normal judgment and insight. Alert and oriented x 3. Normal mood.     Labs on Admission: I have personally reviewed following labs and imaging studies  CBC:  Recent Labs Lab 06/08/17 2113  WBC 25.4*  HGB 11.5*  HCT 34.7*  MCV 93.0  PLT 299   Basic Metabolic Panel:  Recent Labs Lab 06/08/17 2113  NA 136  K 3.3*  CL 102  CO2 24  GLUCOSE 100*  BUN 48*  CREATININE 1.94*  CALCIUM 8.3*   GFR: CrCl cannot be calculated (Unknown ideal weight.). Liver Function Tests:  Recent Labs Lab 06/08/17 2113  AST 40  ALT 22  ALKPHOS 86  BILITOT 0.5  PROT 6.7  ALBUMIN 3.6    Recent Labs Lab 06/08/17 2113  LIPASE 18   No results for input(s): AMMONIA in the last 168 hours. Coagulation Profile: No results for input(s): INR, PROTIME in the last 168 hours. Cardiac Enzymes: No results for input(s): CKTOTAL, CKMB, CKMBINDEX, TROPONINI in the last 168 hours. BNP (last 3 results) No results for input(s): PROBNP in the last 8760  hours. HbA1C: No results for input(s): HGBA1C in the last 72 hours. CBG: No results for input(s): GLUCAP in the last 168 hours. Lipid Profile: No results for input(s): CHOL, HDL, LDLCALC, TRIG, CHOLHDL, LDLDIRECT in the last 72 hours. Thyroid Function Tests: No results for input(s): TSH, T4TOTAL, FREET4, T3FREE, THYROIDAB in the last 72 hours. Anemia Panel: No results for input(s): VITAMINB12, FOLATE, FERRITIN, TIBC, IRON, RETICCTPCT in the last 72 hours. Urine analysis:    Component Value Date/Time   COLORURINE AMBER (A) 06/19/2016 1055   APPEARANCEUR TURBID (A) 06/19/2016 1055   LABSPEC 1.025 06/19/2016 1055   PHURINE 6.0 06/19/2016 1055   GLUCOSEU NEGATIVE 06/19/2016 1055   HGBUR LARGE (A) 06/19/2016 1055   BILIRUBINUR negative 11/16/2016 1521   BILIRUBINUR neg 04/14/2015 1702   KETONESUR negative 11/16/2016 1521   KETONESUR NEGATIVE 06/19/2016 1055   PROTEINUR =30 (A) 11/16/2016 1521   PROTEINUR 100 (A) 06/19/2016 1055   UROBILINOGEN 0.2 11/16/2016 1521   UROBILINOGEN 0.2 04/23/2015 1058   NITRITE Positive (A) 11/16/2016 1521   NITRITE NEGATIVE 06/19/2016 1055   LEUKOCYTESUR small (1+) (A) 11/16/2016 1521   Sepsis Labs: No results found for this or any previous visit (from the past 240 hour(s)).   Radiological Exams on Admission: Ct Abdomen Pelvis Wo Contrast  Result Date: 06/09/2017 CLINICAL DATA:  Lower abdominal pain, fatigue and diarrhea EXAM: CT ABDOMEN AND PELVIS WITHOUT CONTRAST TECHNIQUE: Multidetector CT imaging of the abdomen and pelvis was performed following the standard protocol without IV contrast. COMPARISON:  None. FINDINGS: Lower chest: Dense left lower lobe consolidation. No significant pleural effusion. Patchy minimal ground-glass density in the right lower lobe. 5 mm right lower lobe subpleural pulmonary nodule, series 3, image number 12. Normal heart size. Hepatobiliary: No focal liver abnormality is seen. No gallstones, gallbladder wall thickening, or  biliary dilatation. Pancreas: Unremarkable. No pancreatic ductal dilatation or surrounding inflammatory changes. Spleen: Normal in size without focal abnormality. Adrenals/Urinary Tract: Adrenal glands are unremarkable. Kidneys are normal, without renal calculi, focal lesion, or hydronephrosis. Bladder is unremarkable. Stomach/Bowel: Status post gastric bypass. Negative for small bowel obstruction. No significant colon wall thickening. Vascular/Lymphatic: Aortic atherosclerosis. No enlarged abdominal or pelvic lymph nodes. Reproductive: No adnexal masses. Partially calcified uterine mass presumably a fibroid. Other: Negative for free air or free fluid. Musculoskeletal: Diffuse degenerative changes. Trace anterolisthesis of L4 on L5. IMPRESSION: 1. Dense left lower lobe consolidation is suspicious for a pneumonia. Additional patchy minimal ground-glass density in the right lower lobe may reflect additional small foci of infection/inflammation. 2. 5 mm right lower lobe subpleural pulmonary nodule. No follow-up needed if patient is  low-risk. Non-contrast chest CT can be considered in 12 months if patient is high-risk. This recommendation follows the consensus statement: Guidelines for Management of Incidental Pulmonary Nodules Detected on CT Images: From the Fleischner Society 2017; Radiology 2017; 284:228-243. 3. Negative for bowel obstruction or bowel wall thickening. Postsurgical changes consistent with prior gastric bypass 4. Calcified uterine mass likely a fibroid Electronically Signed   By: Jasmine Pang M.D.   On: 06/09/2017 01:37    CT scan: Independently reviewed. Appreciated left lower lobe consolidation suggestive of pneumonia.   Assessment/Plan Sepsis secondary to community-acquired pneumonia: Acute. Patient presents with reported left upper quadrant abdominal pain, fever up to 101F, and productive cough with brownish sputum. CT scan of abdomen showing a left lung dense consolidation suggestive of  pneumonia. Patient meets SIRS/sepsis criteria, but lactic acid was reassuring at 1.56. Blood cultures were obtained and patient was started on empiric antibiotics of ceftriaxone and azithromycin. - Admit to a MedSurg bed - Pneumonia focused order set initiated - Check sputum and blood cultures - Check pro calcitonin - Continue empiric antibiotics of ceftriaxone and azithromycin - Mucinex - DuoNeb prn shortness of breath/wheezing  Acute renal failure: Baseline creatinine previously 0.7-1, but patient presents with creatinine 1.94 and BUN 48. Elevated BUN to creatinine ratio is just prerenal cause of symptoms. Suspect recent diarrhea likely precipitated symptoms. - IV fluids normal saline at 15ml/hr - Recheck BMP in a.m.  Diarrhea: Resolved. Patient reports diarrhea stopped yesterday morning around 6 AM after taking Imodium. - Continue to monitor  Hypokalemia: Acute. Initial potassium 3.3 on admission. - Give 40 mEq of potassium chloride 1 dose now. - Continue to monitor and replace as needed  Chronic pain, fibromyalgia - Continue Cymbalta    Normocytic normochromic anemia: Chronic. Hemoglobin 11.5 on admission. Patient currently hemodynamically stable. - Recheck CBC in a.m.  Anxiety and depression - Continue Prozac and trazodone as needed for sleep  Alcohol abuse: Patient with previous history of alcohol abuse, but currently reports only drinking 1-2 beers per day. No evidence of withdrawal. - CWIA protocols  DVT prophylaxis: Lovenox  Code Status: full  Family Communication: No family present at bedside  Disposition Plan: Likely discharge home 1-2 days  Consults called: none  Admission status:observation  Clydie Braun MD [facility] Pager [phone]   If 7PM-7AM, please contact night-coverage [email or web address] Password [email or web address]  06/09/2017, 2:42 AM

## 2017-06-09 NOTE — Progress Notes (Signed)
Pt admitted after midnight, for details, please refer to admission note done 8/17. Pt admitted for pneumonia. Continue current abx regimen.  Manson Passey [facility] [phone]

## 2017-06-10 DIAGNOSIS — D649 Anemia, unspecified: Secondary | ICD-10-CM | POA: Diagnosis present

## 2017-06-10 DIAGNOSIS — A403 Sepsis due to Streptococcus pneumoniae: Secondary | ICD-10-CM

## 2017-06-10 DIAGNOSIS — N390 Urinary tract infection, site not specified: Secondary | ICD-10-CM | POA: Diagnosis present

## 2017-06-10 DIAGNOSIS — E876 Hypokalemia: Secondary | ICD-10-CM | POA: Diagnosis present

## 2017-06-10 DIAGNOSIS — M797 Fibromyalgia: Secondary | ICD-10-CM | POA: Diagnosis present

## 2017-06-10 DIAGNOSIS — G894 Chronic pain syndrome: Secondary | ICD-10-CM | POA: Diagnosis present

## 2017-06-10 DIAGNOSIS — J181 Lobar pneumonia, unspecified organism: Secondary | ICD-10-CM | POA: Diagnosis not present

## 2017-06-10 DIAGNOSIS — Z981 Arthrodesis status: Secondary | ICD-10-CM | POA: Diagnosis not present

## 2017-06-10 DIAGNOSIS — F329 Major depressive disorder, single episode, unspecified: Secondary | ICD-10-CM | POA: Diagnosis present

## 2017-06-10 DIAGNOSIS — Z6841 Body Mass Index (BMI) 40.0 and over, adult: Secondary | ICD-10-CM | POA: Diagnosis not present

## 2017-06-10 DIAGNOSIS — Z9884 Bariatric surgery status: Secondary | ICD-10-CM | POA: Diagnosis not present

## 2017-06-10 DIAGNOSIS — F1010 Alcohol abuse, uncomplicated: Secondary | ICD-10-CM | POA: Diagnosis not present

## 2017-06-10 DIAGNOSIS — J154 Pneumonia due to other streptococci: Secondary | ICD-10-CM | POA: Diagnosis present

## 2017-06-10 DIAGNOSIS — J9601 Acute respiratory failure with hypoxia: Secondary | ICD-10-CM | POA: Diagnosis present

## 2017-06-10 DIAGNOSIS — N179 Acute kidney failure, unspecified: Secondary | ICD-10-CM | POA: Diagnosis not present

## 2017-06-10 DIAGNOSIS — E669 Obesity, unspecified: Secondary | ICD-10-CM | POA: Diagnosis present

## 2017-06-10 DIAGNOSIS — Z96653 Presence of artificial knee joint, bilateral: Secondary | ICD-10-CM | POA: Diagnosis present

## 2017-06-10 DIAGNOSIS — R1032 Left lower quadrant pain: Secondary | ICD-10-CM | POA: Diagnosis present

## 2017-06-10 DIAGNOSIS — Z87891 Personal history of nicotine dependence: Secondary | ICD-10-CM | POA: Diagnosis not present

## 2017-06-10 DIAGNOSIS — A409 Streptococcal sepsis, unspecified: Secondary | ICD-10-CM | POA: Diagnosis present

## 2017-06-10 DIAGNOSIS — N183 Chronic kidney disease, stage 3 (moderate): Secondary | ICD-10-CM | POA: Diagnosis present

## 2017-06-10 DIAGNOSIS — J189 Pneumonia, unspecified organism: Secondary | ICD-10-CM | POA: Diagnosis present

## 2017-06-10 DIAGNOSIS — Z79899 Other long term (current) drug therapy: Secondary | ICD-10-CM | POA: Diagnosis not present

## 2017-06-10 DIAGNOSIS — R0603 Acute respiratory distress: Secondary | ICD-10-CM | POA: Diagnosis not present

## 2017-06-10 DIAGNOSIS — G4733 Obstructive sleep apnea (adult) (pediatric): Secondary | ICD-10-CM | POA: Diagnosis present

## 2017-06-10 DIAGNOSIS — J302 Other seasonal allergic rhinitis: Secondary | ICD-10-CM | POA: Diagnosis present

## 2017-06-10 LAB — BASIC METABOLIC PANEL
Anion gap: 9 (ref 5–15)
BUN: 22 mg/dL — ABNORMAL HIGH (ref 6–20)
CALCIUM: 7.9 mg/dL — AB (ref 8.9–10.3)
CO2: 20 mmol/L — AB (ref 22–32)
CREATININE: 1.11 mg/dL — AB (ref 0.44–1.00)
Chloride: 107 mmol/L (ref 101–111)
GFR, EST NON AFRICAN AMERICAN: 53 mL/min — AB (ref >60)
GLUCOSE: 96 mg/dL (ref 65–99)
Potassium: 4.6 mmol/L (ref 3.5–5.1)
Sodium: 136 mmol/L (ref 135–145)

## 2017-06-10 LAB — CBC
HCT: 28.8 % — ABNORMAL LOW (ref 36.0–46.0)
Hemoglobin: 9.3 g/dL — ABNORMAL LOW (ref 12.0–15.0)
MCH: 30.1 pg (ref 26.0–34.0)
MCHC: 32.3 g/dL (ref 30.0–36.0)
MCV: 93.2 fL (ref 78.0–100.0)
PLATELETS: 287 10*3/uL (ref 150–400)
RBC: 3.09 MIL/uL — AB (ref 3.87–5.11)
RDW: 14.9 % (ref 11.5–15.5)
WBC: 16.4 10*3/uL — ABNORMAL HIGH (ref 4.0–10.5)

## 2017-06-10 MED ORDER — GUAIFENESIN-DM 100-10 MG/5ML PO SYRP
5.0000 mL | ORAL_SOLUTION | ORAL | Status: DC | PRN
  Administered 2017-06-10 – 2017-06-13 (×3): 5 mL via ORAL
  Filled 2017-06-10 (×3): qty 10

## 2017-06-10 MED ORDER — BENZONATATE 100 MG PO CAPS
200.0000 mg | ORAL_CAPSULE | Freq: Two times a day (BID) | ORAL | Status: DC
  Administered 2017-06-10 – 2017-06-15 (×11): 200 mg via ORAL
  Filled 2017-06-10 (×11): qty 2

## 2017-06-10 MED ORDER — IPRATROPIUM-ALBUTEROL 0.5-2.5 (3) MG/3ML IN SOLN
3.0000 mL | Freq: Four times a day (QID) | RESPIRATORY_TRACT | Status: DC
  Administered 2017-06-10 – 2017-06-12 (×9): 3 mL via RESPIRATORY_TRACT
  Filled 2017-06-10 (×9): qty 3

## 2017-06-10 MED ORDER — LORATADINE 10 MG PO TABS
10.0000 mg | ORAL_TABLET | Freq: Every day | ORAL | Status: DC
  Administered 2017-06-10 – 2017-06-15 (×6): 10 mg via ORAL
  Filled 2017-06-10 (×6): qty 1

## 2017-06-10 MED ORDER — ACYCLOVIR 5 % EX OINT
TOPICAL_OINTMENT | CUTANEOUS | Status: DC
  Administered 2017-06-10: 15:00:00 via TOPICAL
  Administered 2017-06-10: 1 via TOPICAL
  Administered 2017-06-10 – 2017-06-12 (×11): via TOPICAL
  Administered 2017-06-12: 1 via TOPICAL
  Administered 2017-06-12 – 2017-06-15 (×23): via TOPICAL
  Filled 2017-06-10: qty 15

## 2017-06-10 NOTE — Progress Notes (Signed)
Patient ID: [name], female   DOB: 1955/11/08, 61 y.o.   MRN: [id number]  PROGRESS NOTE    [name]  ACZ:[id number] DOB: 07-31-1956 DOA: 06/08/2017  PCP: Shawnee Knapp, MD   Brief Narrative:  61 year old female with anxiety, depression, gastric bypass, OSA not using CPAP who presented with left sided abd pain for past 2 days. She was found to have pneumonia and started on azithro and Rocephin. Her strep ag was positive.   Assessment & Plan:   Principal Problem:   Sepsis due to UTI and Lobar pneumonia / streptococcus pneumonia / Leukocytosis  - Sepsis criteria met on admission - Source of infection is UTI and pneumonia - UA showed large leukocytes - Strep ag positive - Stopped azithro today - Continue Rocephin - Blood cx and resp cx pending   Active Problems:   Alcohol abuse - Continue CIWA protocol - No reports of withdrawals - Continue thiamine, folic acid    Hypokalemia - Due to sepsis - Supplemented     Acute kidney injury superimposed on CKD stage 3 - Baseline cr 1.2 - Cr elevated on this admission due to sepsis - Cr improving with fluids     UTI - Large leukocytes seen on UA - Obtain urine cx - Continue Rocephin     Depression - Continue Celexa, prozac    DVT prophylaxis: Lovenox subQ Code Status: full code  Family Communication: no family at the bedside Disposition Plan: home by Monday    Consultants:   None   Procedures:   None   Antimicrobials:   Azithro 8/16 --> 8/18  Rocephin 8/16 -->    Subjective: Feels tired.  Objective: Vitals:   06/09/17 0513 06/09/17 2017 06/10/17 0550 06/10/17 0743  BP: 107/68 111/65 (!) 104/58   Pulse: 86 91 94   Resp: '16 18 15   '$ Temp: 98.6 F (37 C) 99.2 F (37.3 C) 98.3 F (36.8 C)   TempSrc: Oral Oral Oral   SpO2: 95% 99% 97% (!) 88%  Weight: 127 kg (280 lb)     Height: '5\' 6"'$  (1.676 m)       Intake/Output Summary (Last 24 hours) at 06/10/17 1305 Last data filed at 06/10/17 0800  Gross per 24 hour  Intake          3216.66 ml  Output                3 ml  Net          3213.66 ml   Filed Weights   06/09/17 0513  Weight: 127 kg (280 lb)    Examination:  General exam: Appears calm and comfortable  Respiratory system: wheezing in right side more so than left side of the lungs Cardiovascular system: S1 & S2 heard, Rate controlled  Gastrointestinal system: Abdomen is nondistended, soft and nontender. No organomegaly or masses felt. Normal bowel sounds heard. Central nervous system: Alert and oriented. No focal neurological deficits. Extremities: Symmetric 5 x 5 power. Skin: No rashes, lesions or ulcers Psychiatry: Judgement and insight appear normal. Mood & affect appropriate.   Data Reviewed: I have personally reviewed following labs and imaging studies  CBC:  Recent Labs Lab 06/08/17 2113 06/09/17 0541 06/10/17 0757  WBC 25.4* 22.6* 16.4*  HGB 11.5* 11.0* 9.3*  HCT 34.7* 33.2* 28.8*  MCV 93.0 93.0 93.2  PLT 299 232 109   Basic Metabolic Panel:  Recent Labs Lab 06/08/17 2113 06/09/17 0541 06/10/17 0603  NA 136  135 136  K 3.3* 3.2* 4.6  CL 102 104 107  CO2 24 21* 20*  GLUCOSE 100* 139* 96  BUN 48* 45* 22*  CREATININE 1.94* 1.68* 1.11*  CALCIUM 8.3* 7.8* 7.9*   GFR: Estimated Creatinine Clearance: 74.4 mL/min (A) (by C-G formula based on SCr of 1.11 mg/dL (H)). Liver Function Tests:  Recent Labs Lab 06/08/17 2113  AST 40  ALT 22  ALKPHOS 86  BILITOT 0.5  PROT 6.7  ALBUMIN 3.6    Recent Labs Lab 06/08/17 2113  LIPASE 18   No results for input(s): AMMONIA in the last 168 hours. Coagulation Profile: No results for input(s): INR, PROTIME in the last 168 hours. Cardiac Enzymes: No results for input(s): CKTOTAL, CKMB, CKMBINDEX, TROPONINI in the last 168 hours. BNP (last 3 results) No results for input(s): PROBNP in the last 8760 hours. HbA1C: No results for input(s): HGBA1C in the last 72 hours. CBG: No results for input(s):  GLUCAP in the last 168 hours. Lipid Profile: No results for input(s): CHOL, HDL, LDLCALC, TRIG, CHOLHDL, LDLDIRECT in the last 72 hours. Thyroid Function Tests: No results for input(s): TSH, T4TOTAL, FREET4, T3FREE, THYROIDAB in the last 72 hours. Anemia Panel: No results for input(s): VITAMINB12, FOLATE, FERRITIN, TIBC, IRON, RETICCTPCT in the last 72 hours. Urine analysis:    Component Value Date/Time   COLORURINE YELLOW 06/09/2017 0526   APPEARANCEUR CLEAR 06/09/2017 0526   LABSPEC 1.010 06/09/2017 0526   PHURINE 5.0 06/09/2017 0526   GLUCOSEU NEGATIVE 06/09/2017 0526   HGBUR MODERATE (A) 06/09/2017 0526   BILIRUBINUR NEGATIVE 06/09/2017 0526   BILIRUBINUR negative 11/16/2016 1521   BILIRUBINUR neg 04/14/2015 1702   KETONESUR NEGATIVE 06/09/2017 0526   PROTEINUR 30 (A) 06/09/2017 0526   UROBILINOGEN 0.2 11/16/2016 1521   UROBILINOGEN 0.2 04/23/2015 1058   NITRITE NEGATIVE 06/09/2017 0526   LEUKOCYTESUR LARGE (A) 06/09/2017 0526   Sepsis Labs: '@LABRCNTIP'$ (procalcitonin:4,lacticidven:4)    Recent Results (from the past 240 hour(s))  Culture, sputum-assessment     Status: None   Collection Time: 06/09/17  5:15 AM  Result Value Ref Range Status   Specimen Description SPUTUM  Final   Special Requests NONE  Final   Sputum evaluation THIS SPECIMEN IS ACCEPTABLE FOR SPUTUM CULTURE  Final   Report Status 06/09/2017 FINAL  Final  MRSA PCR Screening     Status: None   Collection Time: 06/09/17  5:15 AM  Result Value Ref Range Status   MRSA by PCR NEGATIVE NEGATIVE Final    Comment:        The GeneXpert MRSA Assay (FDA approved for NASAL specimens only), is one component of a comprehensive MRSA colonization surveillance program. It is not intended to diagnose MRSA infection nor to guide or monitor treatment for MRSA infections.   Culture, respiratory (NON-Expectorated)     Status: None (Preliminary result)   Collection Time: 06/09/17  5:15 AM  Result Value Ref Range  Status   Specimen Description SPUTUM  Final   Special Requests NONE Reflexed from [id number]  Final   Gram Stain   Final    RARE WBC PRESENT, PREDOMINANTLY PMN FEW GRAM POSITIVE COCCI RARE GRAM VARIABLE ROD RARE SQUAMOUS EPITHELIAL CELLS PRESENT    Culture   Final    CULTURE REINCUBATED FOR BETTER GROWTH Performed at [facility], [place] [address], [place], [place] [address]    Report Status PENDING  Incomplete      Radiology Studies: Ct Abdomen Pelvis Wo Contrast  Result Date:  06/09/2017 CLINICAL DATA:  Lower abdominal pain, fatigue and diarrhea EXAM: CT ABDOMEN AND PELVIS WITHOUT CONTRAST TECHNIQUE: Multidetector CT imaging of the abdomen and pelvis was performed following the standard protocol without IV contrast. COMPARISON:  None. FINDINGS: Lower chest: Dense left lower lobe consolidation. No significant pleural effusion. Patchy minimal ground-glass density in the right lower lobe. 5 mm right lower lobe subpleural pulmonary nodule, series 3, image number 12. Normal heart size. Hepatobiliary: No focal liver abnormality is seen. No gallstones, gallbladder wall thickening, or biliary dilatation. Pancreas: Unremarkable. No pancreatic ductal dilatation or surrounding inflammatory changes. Spleen: Normal in size without focal abnormality. Adrenals/Urinary Tract: Adrenal glands are unremarkable. Kidneys are normal, without renal calculi, focal lesion, or hydronephrosis. Bladder is unremarkable. Stomach/Bowel: Status post gastric bypass. Negative for small bowel obstruction. No significant colon wall thickening. Vascular/Lymphatic: Aortic atherosclerosis. No enlarged abdominal or pelvic lymph nodes. Reproductive: No adnexal masses. Partially calcified uterine mass presumably a fibroid. Other: Negative for free air or free fluid. Musculoskeletal: Diffuse degenerative changes. Trace anterolisthesis of L4 on L5. IMPRESSION: 1. Dense left lower lobe consolidation is suspicious for a pneumonia.  Additional patchy minimal ground-glass density in the right lower lobe may reflect additional small foci of infection/inflammation. 2. 5 mm right lower lobe subpleural pulmonary nodule. No follow-up needed if patient is low-risk. Non-contrast chest CT can be considered in 12 months if patient is high-risk. This recommendation follows the consensus statement: Guidelines for Management of Incidental Pulmonary Nodules Detected on CT Images: From the Fleischner Society 2017; Radiology 2017; 284:228-243. 3. Negative for bowel obstruction or bowel wall thickening. Postsurgical changes consistent with prior gastric bypass 4. Calcified uterine mass likely a fibroid Electronically Signed   By: Donavan Foil M.D.   On: 06/09/2017 01:37        Scheduled Meds: . acyclovir ointment   Topical Q3H  . benzonatate  200 mg Oral BID  . DULoxetine  60 mg Oral Daily  . enoxaparin (LOVENOX) injection  40 mg Subcutaneous Q24H  . FLUoxetine  20 mg Oral Daily  . folic acid  1 mg Oral Daily  . guaiFENesin  600 mg Oral BID  . ipratropium-albuterol  3 mL Nebulization Q6H  . loratadine  10 mg Oral Daily  . multivitamin with minerals  1 tablet Oral Daily  . thiamine  100 mg Oral Daily   Or  . thiamine  100 mg Intravenous Daily   Continuous Infusions: . sodium chloride 10 mL/hr at 06/10/17 0943  . cefTRIAXone (ROCEPHIN)  IV Stopped (06/09/17 2128)     LOS: 0 days    Time spent: 25 minutes  Greater than 50% of the time spent on counseling and coordinating the care.   Leisa Lenz, MD [facility] Pager [phone]  If 7PM-7AM, please contact night-coverage [email or web address] Password [email or web address] 06/10/2017, 1:05 PM

## 2017-06-11 ENCOUNTER — Inpatient Hospital Stay (HOSPITAL_COMMUNITY): Payer: BC Managed Care – PPO

## 2017-06-11 LAB — CBC
HEMATOCRIT: 28.1 % — AB (ref 36.0–46.0)
HEMOGLOBIN: 9.3 g/dL — AB (ref 12.0–15.0)
MCH: 30.9 pg (ref 26.0–34.0)
MCHC: 33.1 g/dL (ref 30.0–36.0)
MCV: 93.4 fL (ref 78.0–100.0)
Platelets: 281 10*3/uL (ref 150–400)
RBC: 3.01 MIL/uL — ABNORMAL LOW (ref 3.87–5.11)
RDW: 14.8 % (ref 11.5–15.5)
WBC: 11.9 10*3/uL — AB (ref 4.0–10.5)

## 2017-06-11 LAB — BASIC METABOLIC PANEL
ANION GAP: 8 (ref 5–15)
BUN: 12 mg/dL (ref 6–20)
CHLORIDE: 106 mmol/L (ref 101–111)
CO2: 25 mmol/L (ref 22–32)
Calcium: 8.4 mg/dL — ABNORMAL LOW (ref 8.9–10.3)
Creatinine, Ser: 0.97 mg/dL (ref 0.44–1.00)
GFR calc Af Amer: >60 mL/min (ref >60)
GFR calc non Af Amer: >60 mL/min (ref >60)
GLUCOSE: 104 mg/dL — AB (ref 65–99)
POTASSIUM: 3.3 mmol/L — AB (ref 3.5–5.1)
Sodium: 139 mmol/L (ref 135–145)

## 2017-06-11 LAB — URINE CULTURE: Culture: NO GROWTH

## 2017-06-11 LAB — CULTURE, RESPIRATORY W GRAM STAIN: Culture: NORMAL

## 2017-06-11 LAB — CULTURE, RESPIRATORY

## 2017-06-11 MED ORDER — POTASSIUM CHLORIDE CRYS ER 20 MEQ PO TBCR
40.0000 meq | EXTENDED_RELEASE_TABLET | Freq: Once | ORAL | Status: AC
  Administered 2017-06-11: 40 meq via ORAL
  Filled 2017-06-11: qty 2

## 2017-06-11 NOTE — Progress Notes (Signed)
Patient ID: [name], female   DOB: 06-18-1956, 61 y.o.   MRN: [id number]  PROGRESS NOTE    [name]  WFU:[id number] DOB: 13-May-1956 DOA: 06/08/2017  PCP: Shawnee Knapp, MD   Brief Narrative:  61 year old female with anxiety, depression, gastric bypass, OSA not using CPAP who presented with left sided abd pain for past 2 days. She was found to have pneumonia and started on azithro and Rocephin. Her strep ag was positive.   Assessment & Plan:   Principal Problem:   Sepsis due to UTI and Lobar pneumonia / streptococcus pneumonia / Leukocytosis  - Sepsis criteria met on admission - Source of infection: UTI and PNA - Continue Rocephin - Stopped Zithromax 8/18 - Strep ag positive - Blood cx negative   Active Problems:   Alcohol abuse - Continue CIWA protocol - Continue thiamine, folic acid - No withdrawals     Hypokalemia - Due to sepsis - Supplemented  - Check magnesium and BMP in am    Acute kidney injury superimposed on CKD stage 3 - Baseline cr 1.2 - Cr elevated on this admission due to sepsis - Cr resolved to WNL with IV fluids     UTI - Large leukocytes seen on UA - Urine cx pending - Continue rocephin     Depression - Continue Celexa, prozac  DVT prophylaxis: Lovenox subQ Code Status: full code  Family Communication: no family at the bedside Disposition Plan: home once resp status stable, by Tuesday    Consultants:   None   Procedures:   None   Antimicrobials:   Azithro 8/16 --> 8/18  Rocephin 8/16 -->    Subjective: Feels congested.  Objective: Vitals:   06/11/17 0151 06/11/17 0455 06/11/17 0622 06/11/17 0817  BP:   (!) 142/79   Pulse:   92   Resp:   16   Temp:   98.6 F (37 C)   TempSrc:   Oral   SpO2: 93% 95% 94% 93%  Weight:      Height:        Intake/Output Summary (Last 24 hours) at 06/11/17 1130 Last data filed at 06/11/17 2025  Gross per 24 hour  Intake          1146.17 ml  Output             3750 ml  Net          -2603.83 ml   Filed Weights   06/09/17 0513  Weight: 127 kg (280 lb)    Physical Exam  Constitutional: Appears well-developed and well-nourished. No distress.   CVS: RRR, S1/S2 +, no murmurs Pulmonary: wheezing on right, no rhonchi Abdominal: Soft. BS +,  no distension, tenderness, rebound or guarding.  Musculoskeletal: Normal range of motion. No edema and no tenderness.  Lymphadenopathy: No lymphadenopathy noted, cervical, inguinal. Neuro: Alert. Normal reflexes, muscle tone coordination. No cranial nerve deficit. Skin: Skin is warm and dry. No rash noted. Not diaphoretic. No erythema. No pallor.  Psychiatric: Normal mood and affect. Behavior, judgment, thought content normal.     Data Reviewed: I have personally reviewed following labs and imaging studies  CBC:  Recent Labs Lab 06/08/17 2113 06/09/17 0541 06/10/17 0757 06/11/17 0555  WBC 25.4* 22.6* 16.4* 11.9*  HGB 11.5* 11.0* 9.3* 9.3*  HCT 34.7* 33.2* 28.8* 28.1*  MCV 93.0 93.0 93.2 93.4  PLT 299 232 287 427   Basic Metabolic Panel:  Recent Labs Lab 06/08/17 2113 06/09/17 0541 06/10/17  0603 06/11/17 0555  NA 136 135 136 139  K 3.3* 3.2* 4.6 3.3*  CL 102 104 107 106  CO2 24 21* 20* 25  GLUCOSE 100* 139* 96 104*  BUN 48* 45* 22* 12  CREATININE 1.94* 1.68* 1.11* 0.97  CALCIUM 8.3* 7.8* 7.9* 8.4*   GFR: Estimated Creatinine Clearance: 85.2 mL/min (by C-G formula based on SCr of 0.97 mg/dL). Liver Function Tests:  Recent Labs Lab 06/08/17 2113  AST 40  ALT 22  ALKPHOS 86  BILITOT 0.5  PROT 6.7  ALBUMIN 3.6    Recent Labs Lab 06/08/17 2113  LIPASE 18   No results for input(s): AMMONIA in the last 168 hours. Coagulation Profile: No results for input(s): INR, PROTIME in the last 168 hours. Cardiac Enzymes: No results for input(s): CKTOTAL, CKMB, CKMBINDEX, TROPONINI in the last 168 hours. BNP (last 3 results) No results for input(s): PROBNP in the last 8760 hours. HbA1C: No results for  input(s): HGBA1C in the last 72 hours. CBG: No results for input(s): GLUCAP in the last 168 hours. Lipid Profile: No results for input(s): CHOL, HDL, LDLCALC, TRIG, CHOLHDL, LDLDIRECT in the last 72 hours. Thyroid Function Tests: No results for input(s): TSH, T4TOTAL, FREET4, T3FREE, THYROIDAB in the last 72 hours. Anemia Panel: No results for input(s): VITAMINB12, FOLATE, FERRITIN, TIBC, IRON, RETICCTPCT in the last 72 hours. Urine analysis:    Component Value Date/Time   COLORURINE YELLOW 06/09/2017 0526   APPEARANCEUR CLEAR 06/09/2017 0526   LABSPEC 1.010 06/09/2017 0526   PHURINE 5.0 06/09/2017 0526   GLUCOSEU NEGATIVE 06/09/2017 0526   HGBUR MODERATE (A) 06/09/2017 0526   BILIRUBINUR NEGATIVE 06/09/2017 0526   BILIRUBINUR negative 11/16/2016 1521   BILIRUBINUR neg 04/14/2015 1702   KETONESUR NEGATIVE 06/09/2017 0526   PROTEINUR 30 (A) 06/09/2017 0526   UROBILINOGEN 0.2 11/16/2016 1521   UROBILINOGEN 0.2 04/23/2015 1058   NITRITE NEGATIVE 06/09/2017 0526   LEUKOCYTESUR LARGE (A) 06/09/2017 0526   Sepsis Labs: @LABRCNTIP (procalcitonin:4,lacticidven:4)    Recent Results (from the past 240 hour(s))  Culture, blood (Routine X 2) w Reflex to ID Panel     Status: None (Preliminary result)   Collection Time: 06/09/17 12:24 AM  Result Value Ref Range Status   Specimen Description BLOOD RIGHT ANTECUBITAL  Final   Special Requests   Final    BOTTLES DRAWN AEROBIC AND ANAEROBIC Blood Culture adequate volume   Culture   Final    NO GROWTH 1 DAY Performed at [facility], [address] [place], [place]    Report Status PENDING  Incomplete  Culture, sputum-assessment     Status: None   Collection Time: 06/09/17  5:15 AM  Result Value Ref Range Status   Specimen Description SPUTUM  Final   Special Requests NONE  Final   Sputum evaluation THIS SPECIMEN IS ACCEPTABLE FOR SPUTUM CULTURE  Final   Report Status 06/09/2017 FINAL  Final  MRSA PCR Screening      Status: None   Collection Time: 06/09/17  5:15 AM  Result Value Ref Range Status   MRSA by PCR NEGATIVE NEGATIVE Final    Comment:        The GeneXpert MRSA Assay (FDA approved for NASAL specimens only), is one component of a comprehensive MRSA colonization surveillance program. It is not intended to diagnose MRSA infection nor to guide or monitor treatment for MRSA infections.   Culture, respiratory (NON-Expectorated)     Status: None   Collection Time: 06/09/17  5:15 AM  Result Value Ref Range Status   Specimen Description SPUTUM  Final   Special Requests NONE Reflexed from [id number]  Final   Gram Stain   Final    RARE WBC PRESENT, PREDOMINANTLY PMN FEW GRAM POSITIVE COCCI RARE GRAM VARIABLE ROD RARE SQUAMOUS EPITHELIAL CELLS PRESENT    Culture   Final    Consistent with normal respiratory flora. Performed at [facility], [address], [place], [place] [address]    Report Status 06/11/2017 FINAL  Final  Culture, blood (Routine X 2) w Reflex to ID Panel     Status: None (Preliminary result)   Collection Time: 06/09/17  5:27 AM  Result Value Ref Range Status   Specimen Description BLOOD BLOOD RIGHT ARM  Final   Special Requests IN PEDIATRIC BOTTLE Blood Culture adequate volume  Final   Culture   Final    NO GROWTH 1 DAY Performed at [facility], [address], [place], [place] [address]    Report Status PENDING  Incomplete      Radiology Studies: Ct Abdomen Pelvis Wo Contrast  Result Date: 06/09/2017 CLINICAL DATA:  Lower abdominal pain, fatigue and diarrhea EXAM: CT ABDOMEN AND PELVIS WITHOUT CONTRAST TECHNIQUE: Multidetector CT imaging of the abdomen and pelvis was performed following the standard protocol without IV contrast. COMPARISON:  None. FINDINGS: Lower chest: Dense left lower lobe consolidation. No significant pleural effusion. Patchy minimal ground-glass density in the right lower lobe. 5 mm right lower lobe subpleural pulmonary nodule, series  3, image number 12. Normal heart size. Hepatobiliary: No focal liver abnormality is seen. No gallstones, gallbladder wall thickening, or biliary dilatation. Pancreas: Unremarkable. No pancreatic ductal dilatation or surrounding inflammatory changes. Spleen: Normal in size without focal abnormality. Adrenals/Urinary Tract: Adrenal glands are unremarkable. Kidneys are normal, without renal calculi, focal lesion, or hydronephrosis. Bladder is unremarkable. Stomach/Bowel: Status post gastric bypass. Negative for small bowel obstruction. No significant colon wall thickening. Vascular/Lymphatic: Aortic atherosclerosis. No enlarged abdominal or pelvic lymph nodes. Reproductive: No adnexal masses. Partially calcified uterine mass presumably a fibroid. Other: Negative for free air or free fluid. Musculoskeletal: Diffuse degenerative changes. Trace anterolisthesis of L4 on L5. IMPRESSION: 1. Dense left lower lobe consolidation is suspicious for a pneumonia. Additional patchy minimal ground-glass density in the right lower lobe may reflect additional small foci of infection/inflammation. 2. 5 mm right lower lobe subpleural pulmonary nodule. No follow-up needed if patient is low-risk. Non-contrast chest CT can be considered in 12 months if patient is high-risk. This recommendation follows the consensus statement: Guidelines for Management of Incidental Pulmonary Nodules Detected on CT Images: From the Fleischner Society 2017; Radiology 2017; 284:228-243. 3. Negative for bowel obstruction or bowel wall thickening. Postsurgical changes consistent with prior gastric bypass 4. Calcified uterine mass likely a fibroid Electronically Signed   By: Jasmine Pang M.D.   On: 06/09/2017 01:37   Dg Chest Port 1 View  Result Date: 06/11/2017 CLINICAL DATA:  Acute respiratory distress. EXAM: PORTABLE CHEST 1 VIEW COMPARISON:  November 16, 2016 FINDINGS: No pneumothorax. The cardiac silhouette is stable. There is tortuous thoracic aorta.  This is more prominent in the interval but the difference is likely due to the portable technique. The patient may benefit from a PA and lateral chest x-ray before discharge. New opacity in the left base. No other acute abnormalities. IMPRESSION: 1. Increased prominence of the torturous thoracic aorta is likely technical in nature. The patient may benefit from a PA and lateral  chest x-ray before discharge. 2. New opacity in the left base. Recommend clinical correlation and follow-up to resolution. Electronically Signed   By: Dorise Bullion III M.D   On: 06/11/2017 07:13        Scheduled Meds: . acyclovir ointment   Topical Q3H  . benzonatate  200 mg Oral BID  . DULoxetine  60 mg Oral Daily  . enoxaparin (LOVENOX) injection  40 mg Subcutaneous Q24H  . FLUoxetine  20 mg Oral Daily  . folic acid  1 mg Oral Daily  . guaiFENesin  600 mg Oral BID  . ipratropium-albuterol  3 mL Nebulization Q6H  . loratadine  10 mg Oral Daily  . multivitamin with minerals  1 tablet Oral Daily  . potassium chloride  40 mEq Oral Once  . thiamine  100 mg Oral Daily   Or  . thiamine  100 mg Intravenous Daily   Continuous Infusions: . sodium chloride Stopped (06/11/17 0752)  . cefTRIAXone (ROCEPHIN)  IV Stopped (06/10/17 2159)     LOS: 1 day    Time spent: 25 minutes  Greater than 50% of the time spent on counseling and coordinating the care.   Leisa Lenz, MD [facility] Pager [phone]  If 7PM-7AM, please contact night-coverage [email or web address] Password [email or web address] 06/11/2017, 11:30 AM

## 2017-06-11 NOTE — Progress Notes (Addendum)
Pt. C/o increased SOB checked with respiratory and it is not time for her to have another breathing treatment. Pt. Is lying flat in bed. Explained to the patient that it would make her breathing easier if she were to raise the head of her bed. The pt. States she will do it in a minute but had not done it by the time I was ready to leave the room. Asked if she wanted me to do it for her and she stated that she will do it. Lung sounds diminished O2 sats 84 % on RA placed pt. On O2 2L via [place] O2 sat's up to 95 %  pt. Reports that she feels her SOB is because of her side pain from coughing so much. Explained to her that she will need to keep her O2 on and I will let the Dr. Ashley Mariner.

## 2017-06-11 NOTE — Progress Notes (Signed)
No change in earlier shift assessment. Pt. Awake in bed and denies c/o @ this time.

## 2017-06-12 LAB — BASIC METABOLIC PANEL
ANION GAP: 7 (ref 5–15)
BUN: 8 mg/dL (ref 6–20)
CO2: 27 mmol/L (ref 22–32)
Calcium: 8.5 mg/dL — ABNORMAL LOW (ref 8.9–10.3)
Chloride: 104 mmol/L (ref 101–111)
Creatinine, Ser: 0.87 mg/dL (ref 0.44–1.00)
GFR calc Af Amer: >60 mL/min (ref >60)
Glucose, Bld: 112 mg/dL — ABNORMAL HIGH (ref 65–99)
POTASSIUM: 3.6 mmol/L (ref 3.5–5.1)
SODIUM: 138 mmol/L (ref 135–145)

## 2017-06-12 LAB — CBC
HCT: 29.2 % — ABNORMAL LOW (ref 36.0–46.0)
Hemoglobin: 9.6 g/dL — ABNORMAL LOW (ref 12.0–15.0)
MCH: 30.1 pg (ref 26.0–34.0)
MCHC: 32.9 g/dL (ref 30.0–36.0)
MCV: 91.5 fL (ref 78.0–100.0)
PLATELETS: 366 10*3/uL (ref 150–400)
RBC: 3.19 MIL/uL — AB (ref 3.87–5.11)
RDW: 14.6 % (ref 11.5–15.5)
WBC: 11.9 10*3/uL — ABNORMAL HIGH (ref 4.0–10.5)

## 2017-06-12 LAB — MAGNESIUM: MAGNESIUM: 1.9 mg/dL (ref 1.7–2.4)

## 2017-06-12 MED ORDER — IPRATROPIUM-ALBUTEROL 0.5-2.5 (3) MG/3ML IN SOLN
3.0000 mL | RESPIRATORY_TRACT | Status: DC
  Administered 2017-06-12: 3 mL via RESPIRATORY_TRACT
  Filled 2017-06-12: qty 3

## 2017-06-12 MED ORDER — SENNA 8.6 MG PO TABS
1.0000 | ORAL_TABLET | Freq: Every day | ORAL | Status: DC
  Administered 2017-06-12 – 2017-06-15 (×4): 8.6 mg via ORAL
  Filled 2017-06-12 (×4): qty 1

## 2017-06-12 MED ORDER — METHYLPREDNISOLONE SODIUM SUCC 125 MG IJ SOLR
60.0000 mg | Freq: Four times a day (QID) | INTRAMUSCULAR | Status: DC
  Administered 2017-06-12 – 2017-06-14 (×9): 60 mg via INTRAVENOUS
  Filled 2017-06-12 (×9): qty 2

## 2017-06-12 MED ORDER — IPRATROPIUM-ALBUTEROL 0.5-2.5 (3) MG/3ML IN SOLN
3.0000 mL | Freq: Four times a day (QID) | RESPIRATORY_TRACT | Status: DC
  Administered 2017-06-12 – 2017-06-13 (×4): 3 mL via RESPIRATORY_TRACT
  Filled 2017-06-12 (×4): qty 3

## 2017-06-12 MED ORDER — ARFORMOTEROL TARTRATE 15 MCG/2ML IN NEBU
15.0000 ug | INHALATION_SOLUTION | Freq: Two times a day (BID) | RESPIRATORY_TRACT | Status: DC
  Administered 2017-06-12 – 2017-06-15 (×7): 15 ug via RESPIRATORY_TRACT
  Filled 2017-06-12 (×7): qty 2

## 2017-06-12 MED ORDER — BISACODYL 10 MG RE SUPP: 10.0000 mg | Freq: Every day | RECTAL | Status: DC | PRN

## 2017-06-12 MED ORDER — BUDESONIDE 0.25 MG/2ML IN SUSP
0.2500 mg | Freq: Two times a day (BID) | RESPIRATORY_TRACT | Status: DC
  Administered 2017-06-12 – 2017-06-15 (×7): 0.25 mg via RESPIRATORY_TRACT
  Filled 2017-06-12 (×7): qty 2

## 2017-06-12 MED ORDER — IPRATROPIUM-ALBUTEROL 0.5-2.5 (3) MG/3ML IN SOLN: 3.0000 mL | RESPIRATORY_TRACT | Status: DC | PRN

## 2017-06-12 NOTE — Progress Notes (Addendum)
Patient ID: [name], female   DOB: Dec 29, 1955, 61 y.o.   MRN: [id number]  PROGRESS NOTE    MYKA HITZ  TFT:[id number] DOB: 03/17/1956 DOA: 06/08/2017  PCP: Shawnee Knapp, MD   Brief Narrative:  61 year old female with anxiety, depression, gastric bypass, OSA not using CPAP who presented with left sided abd pain for past 2 days. She was found to have pneumonia and started on azithro and Rocephin. Her strep ag was positive.  Assessment & Plan:   Principal Problem:   Sepsis due to UTI and Lobar pneumonia / streptococcus pneumonia / Leukocytosis  - Sepsis criteria met on admission - Source of infections is pneumonia - Stopped azithro 8/18 - Continue rocephin  - Strep ag postiive - Blood cx negative - Urine cx showed no growth  Active Problems:   Acute respiratory failure with hypoxia - Due to pneumonia - Added Pulmicort and brovana nebulizer treatments BID - Added solumedrol 60 mg IV Q 6 hours - Continue current HCW:CBJSEG every 4 hours sch and every 2 hours as needed for shortness of breath or wheezing     Alcohol abuse - Continue CIWA protocol - Continue thiamine, folic acid, multivitamin  - No reports of withdrawals     Hypokalemia - Due to sepsis - Supplemented and WNL    Acute kidney injury superimposed on CKD stage 3 - Baseline cr 1.2 - Cr resolved with IV fluids     UTI - Large leukocytes seen on UA - Urine cx negative  - Continue rocephin     Depression - Continue celexa, prozac    DVT prophylaxis: Lovenox subQ Code Status: full code  Family Communication: no family at the bedside Disposition Plan: not yet stable for discharge, home once resp status stable   Consultants:   None   Procedures:   None   Antimicrobials:   Azithro 8/16 --> 8/18  Rocephin 8/16 -->   Subjective: Feels little worse, has more wheeling.   Objective: Vitals:   06/11/17 2056 06/12/17 0250 06/12/17 0555 06/12/17 0742  BP:   117/60   Pulse:   (!) 103     Resp:   16   Temp:   98.4 F (36.9 C)   TempSrc:   Oral   SpO2: 100% 95% 91% (!) 86%  Weight:      Height:        Intake/Output Summary (Last 24 hours) at 06/12/17 0955 Last data filed at 06/12/17 0556  Gross per 24 hour  Intake              480 ml  Output             1525 ml  Net            -1045 ml   Filed Weights   06/09/17 0513  Weight: 127 kg (280 lb)    Examination:  General exam: Appears calm and comfortable  Respiratory system: wheezing in upper and mid lung lobes, coarse sounds Cardiovascular system: S1 & S2 heard, Rate controlled  Gastrointestinal system: Abdomen is nondistended, soft and nontender. No organomegaly or masses felt. Normal bowel sounds heard. Central nervous system: Alert and oriented. No focal neurological deficits. Extremities: Symmetric 5 x 5 power. Skin: No rashes, lesions or ulcers Psychiatry: Judgement and insight appear normal. Mood & affect appropriate.   Data Reviewed: I have personally reviewed following labs and imaging studies  CBC:  Recent Labs Lab 06/08/17 2113 06/09/17 0541 06/10/17 0757 06/11/17  0555 06/12/17 0759  WBC 25.4* 22.6* 16.4* 11.9* 11.9*  HGB 11.5* 11.0* 9.3* 9.3* 9.6*  HCT 34.7* 33.2* 28.8* 28.1* 29.2*  MCV 93.0 93.0 93.2 93.4 91.5  PLT 299 232 287 281 478   Basic Metabolic Panel:  Recent Labs Lab 06/08/17 2113 06/09/17 0541 06/10/17 0603 06/11/17 0555 06/12/17 0759  NA 136 135 136 139 138  K 3.3* 3.2* 4.6 3.3* 3.6  CL 102 104 107 106 104  CO2 24 21* 20* 25 27  GLUCOSE 100* 139* 96 104* 112*  BUN 48* 45* 22* 12 8  CREATININE 1.94* 1.68* 1.11* 0.97 0.87  CALCIUM 8.3* 7.8* 7.9* 8.4* 8.5*  MG  --   --   --   --  1.9   GFR: Estimated Creatinine Clearance: 95 mL/min (by C-G formula based on SCr of 0.87 mg/dL). Liver Function Tests:  Recent Labs Lab 06/08/17 2113  AST 40  ALT 22  ALKPHOS 86  BILITOT 0.5  PROT 6.7  ALBUMIN 3.6    Recent Labs Lab 06/08/17 2113  LIPASE 18   No results  for input(s): AMMONIA in the last 168 hours. Coagulation Profile: No results for input(s): INR, PROTIME in the last 168 hours. Cardiac Enzymes: No results for input(s): CKTOTAL, CKMB, CKMBINDEX, TROPONINI in the last 168 hours. BNP (last 3 results) No results for input(s): PROBNP in the last 8760 hours. HbA1C: No results for input(s): HGBA1C in the last 72 hours. CBG: No results for input(s): GLUCAP in the last 168 hours. Lipid Profile: No results for input(s): CHOL, HDL, LDLCALC, TRIG, CHOLHDL, LDLDIRECT in the last 72 hours. Thyroid Function Tests: No results for input(s): TSH, T4TOTAL, FREET4, T3FREE, THYROIDAB in the last 72 hours. Anemia Panel: No results for input(s): VITAMINB12, FOLATE, FERRITIN, TIBC, IRON, RETICCTPCT in the last 72 hours. Urine analysis:    Component Value Date/Time   COLORURINE YELLOW 06/09/2017 0526   APPEARANCEUR CLEAR 06/09/2017 0526   LABSPEC 1.010 06/09/2017 0526   PHURINE 5.0 06/09/2017 0526   GLUCOSEU NEGATIVE 06/09/2017 0526   HGBUR MODERATE (A) 06/09/2017 0526   BILIRUBINUR NEGATIVE 06/09/2017 0526   BILIRUBINUR negative 11/16/2016 1521   BILIRUBINUR neg 04/14/2015 1702   KETONESUR NEGATIVE 06/09/2017 0526   PROTEINUR 30 (A) 06/09/2017 0526   UROBILINOGEN 0.2 11/16/2016 1521   UROBILINOGEN 0.2 04/23/2015 1058   NITRITE NEGATIVE 06/09/2017 0526   LEUKOCYTESUR LARGE (A) 06/09/2017 0526   Sepsis Labs: _0 (procalcitonin:4,lacticidven:4)   ) Recent Results (from the past 240 hour(s))  Culture, blood (Routine X 2) w Reflex to ID Panel     Status: None (Preliminary result)   Collection Time: 06/09/17 12:24 AM  Result Value Ref Range Status   Specimen Description BLOOD RIGHT ANTECUBITAL  Final   Special Requests   Final    BOTTLES DRAWN AEROBIC AND ANAEROBIC Blood Culture adequate volume   Culture   Final    NO GROWTH 2 DAYS Performed at [facility], [place] [address] [place], [place] [address]    Report Status PENDING   Incomplete  Culture, sputum-assessment     Status: None   Collection Time: 06/09/17  5:15 AM  Result Value Ref Range Status   Specimen Description SPUTUM  Final   Special Requests NONE  Final   Sputum evaluation THIS SPECIMEN IS ACCEPTABLE FOR SPUTUM CULTURE  Final   Report Status 06/09/2017 FINAL  Final  MRSA PCR Screening     Status: None   Collection Time: 06/09/17  5:15 AM  Result  Value Ref Range Status   MRSA by PCR NEGATIVE NEGATIVE Final    Comment:        The GeneXpert MRSA Assay (FDA approved for NASAL specimens only), is one component of a comprehensive MRSA colonization surveillance program. It is not intended to diagnose MRSA infection nor to guide or monitor treatment for MRSA infections.   Culture, respiratory (NON-Expectorated)     Status: None   Collection Time: 06/09/17  5:15 AM  Result Value Ref Range Status   Specimen Description SPUTUM  Final   Special Requests NONE Reflexed from [id number]  Final   Gram Stain   Final    RARE WBC PRESENT, PREDOMINANTLY PMN FEW GRAM POSITIVE COCCI RARE GRAM VARIABLE ROD RARE SQUAMOUS EPITHELIAL CELLS PRESENT    Culture   Final    Consistent with normal respiratory flora. Performed at [facility], [place] [address], [place], [place] [address]    Report Status 06/11/2017 FINAL  Final  Culture, blood (Routine X 2) w Reflex to ID Panel     Status: None (Preliminary result)   Collection Time: 06/09/17  5:27 AM  Result Value Ref Range Status   Specimen Description BLOOD BLOOD RIGHT ARM  Final   Special Requests IN PEDIATRIC BOTTLE Blood Culture adequate volume  Final   Culture   Final    NO GROWTH 2 DAYS Performed at [facility], [place] [address] [place], [place] [address]    Report Status PENDING  Incomplete  Culture, Urine     Status: None   Collection Time: 06/10/17  1:08 PM  Result Value Ref Range Status   Specimen Description URINE, CLEAN CATCH  Final   Special Requests NONE  Final   Culture   Final    NO  GROWTH Performed at [facility], [place] [address], [place], [place] [address]    Report Status 06/11/2017 FINAL  Final      Radiology Studies: Ct Abdomen Pelvis Wo Contrast  Result Date: 06/09/2017 CLINICAL DATA:  Lower abdominal pain, fatigue and diarrhea EXAM: CT ABDOMEN AND PELVIS WITHOUT CONTRAST TECHNIQUE: Multidetector CT imaging of the abdomen and pelvis was performed following the standard protocol without IV contrast. COMPARISON:  None. FINDINGS: Lower chest: Dense left lower lobe consolidation. No significant pleural effusion. Patchy minimal ground-glass density in the right lower lobe. 5 mm right lower lobe subpleural pulmonary nodule, series 3, image number 12. Normal heart size. Hepatobiliary: No focal liver abnormality is seen. No gallstones, gallbladder wall thickening, or biliary dilatation. Pancreas: Unremarkable. No pancreatic ductal dilatation or surrounding inflammatory changes. Spleen: Normal in size without focal abnormality. Adrenals/Urinary Tract: Adrenal glands are unremarkable. Kidneys are normal, without renal calculi, focal lesion, or hydronephrosis. Bladder is unremarkable. Stomach/Bowel: Status post gastric bypass. Negative for small bowel obstruction. No significant colon wall thickening. Vascular/Lymphatic: Aortic atherosclerosis. No enlarged abdominal or pelvic lymph nodes. Reproductive: No adnexal masses. Partially calcified uterine mass presumably a fibroid. Other: Negative for free air or free fluid. Musculoskeletal: Diffuse degenerative changes. Trace anterolisthesis of L4 on L5. IMPRESSION: 1. Dense left lower lobe consolidation is suspicious for a pneumonia. Additional patchy minimal ground-glass density in the right lower lobe may reflect additional small foci of infection/inflammation. 2. 5 mm right lower lobe subpleural pulmonary nodule. No follow-up needed if patient is low-risk. Non-contrast chest CT can be considered in 12 months if patient is high-risk.  This recommendation follows the consensus statement: Guidelines for Management of Incidental Pulmonary Nodules Detected on CT  Images: From the Fleischner Society 2017; Radiology 2017; 284:228-243. 3. Negative for bowel obstruction or bowel wall thickening. Postsurgical changes consistent with prior gastric bypass 4. Calcified uterine mass likely a fibroid Electronically Signed   By: Donavan Foil M.D.   On: 06/09/2017 01:37   Dg Chest Port 1 View  Result Date: 06/11/2017 CLINICAL DATA:  Acute respiratory distress. EXAM: PORTABLE CHEST 1 VIEW COMPARISON:  November 16, 2016 FINDINGS: No pneumothorax. The cardiac silhouette is stable. There is tortuous thoracic aorta. This is more prominent in the interval but the difference is likely due to the portable technique. The patient may benefit from a PA and lateral chest x-ray before discharge. New opacity in the left base. No other acute abnormalities. IMPRESSION: 1. Increased prominence of the torturous thoracic aorta is likely technical in nature. The patient may benefit from a PA and lateral chest x-ray before discharge. 2. New opacity in the left base. Recommend clinical correlation and follow-up to resolution. Electronically Signed   By: Dorise Bullion III M.D   On: 06/11/2017 07:13        Scheduled Meds: . acyclovir ointment   Topical Q3H  . arformoterol  15 mcg Nebulization BID  . benzonatate  200 mg Oral BID  . budesonide (PULMICORT) nebulizer solution  0.25 mg Nebulization BID  . DULoxetine  60 mg Oral Daily  . enoxaparin (LOVENOX) injection  40 mg Subcutaneous Q24H  . FLUoxetine  20 mg Oral Daily  . folic acid  1 mg Oral Daily  . guaiFENesin  600 mg Oral BID  . ipratropium-albuterol  3 mL Nebulization Q4H  . loratadine  10 mg Oral Daily  . methylPREDNISolone (SOLU-MEDROL) injection  60 mg Intravenous Q6H  . multivitamin with minerals  1 tablet Oral Daily  . senna  1 tablet Oral Daily  . thiamine  100 mg Oral Daily   Or  . thiamine  100  mg Intravenous Daily   Continuous Infusions: . sodium chloride Stopped (06/11/17 0752)  . cefTRIAXone (ROCEPHIN)  IV Stopped (06/11/17 2148)     LOS: 2 days    Time spent: 25 minutes  Greater than 50% of the time spent on counseling and coordinating the care.   Leisa Lenz, MD [facility] Pager [phone]  If 7PM-7AM, please contact night-coverage [email or web address] Password [email or web address] 06/12/2017, 9:55 AM

## 2017-06-12 NOTE — Care Management Note (Signed)
Case Management Note  Patient Details  Name: AMBELLINA TITO MRN: [id number] Date of Birth: 05-28-1956  Subjective/Objective:    PNA/iv solu-medrol and nebs                Action/Plan: Date:  June 12, 2017  Chart reviewed for concurrent status and case management needs.  Will continue to follow patient progress.  Discharge Planning: following for needs  Expected discharge date: [id number]  Marcelle Smiling, BSN, [place], [place]   [phone]   Expected Discharge Date:                  Expected Discharge Plan:  Home/Self Care  In-House Referral:     Discharge planning Services  CM Consult  Post Acute Care Choice:    Choice offered to:     DME Arranged:    DME Agency:     HH Arranged:    HH Agency:     Status of Service:  In process, will continue to follow  If discussed at Long Length of Stay Meetings, dates discussed:    Additional Comments:  Golda Acre, RN 06/12/2017, 10:20 AM

## 2017-06-13 LAB — BASIC METABOLIC PANEL
Anion gap: 12 (ref 5–15)
BUN: 12 mg/dL (ref 6–20)
CALCIUM: 8.7 mg/dL — AB (ref 8.9–10.3)
CO2: 25 mmol/L (ref 22–32)
Chloride: 104 mmol/L (ref 101–111)
Creatinine, Ser: 0.87 mg/dL (ref 0.44–1.00)
GFR calc Af Amer: >60 mL/min (ref >60)
GLUCOSE: 205 mg/dL — AB (ref 65–99)
Potassium: 3.9 mmol/L (ref 3.5–5.1)
Sodium: 141 mmol/L (ref 135–145)

## 2017-06-13 LAB — CBC
HCT: 31.3 % — ABNORMAL LOW (ref 36.0–46.0)
Hemoglobin: 10.3 g/dL — ABNORMAL LOW (ref 12.0–15.0)
MCH: 30 pg (ref 26.0–34.0)
MCHC: 32.9 g/dL (ref 30.0–36.0)
MCV: 91.3 fL (ref 78.0–100.0)
PLATELETS: 454 10*3/uL — AB (ref 150–400)
RBC: 3.43 MIL/uL — ABNORMAL LOW (ref 3.87–5.11)
RDW: 14.6 % (ref 11.5–15.5)
WBC: 12.9 10*3/uL — AB (ref 4.0–10.5)

## 2017-06-13 MED ORDER — IPRATROPIUM-ALBUTEROL 0.5-2.5 (3) MG/3ML IN SOLN
3.0000 mL | Freq: Three times a day (TID) | RESPIRATORY_TRACT | Status: DC
  Administered 2017-06-14 – 2017-06-15 (×4): 3 mL via RESPIRATORY_TRACT
  Filled 2017-06-13 (×4): qty 3

## 2017-06-13 NOTE — Progress Notes (Signed)
Patient ID: [name], female   DOB: February 03, 1956, 61 y.o.   MRN: [id number]  PROGRESS NOTE    [name]  JOA:[id number] DOB: 1956/04/20 DOA: 06/08/2017  PCP: Shawnee Knapp, MD   Brief Narrative:  60 year old female with anxiety, depression, gastric bypass, OSA not using CPAP who presented with left sided abd pain for past 2 days. She was found to have pneumonia and started on azithro and Rocephin. Her strep ag was positive.  Assessment & Plan:   Principal Problem:   Sepsis due to UTI and Lobar pneumonia / streptococcus pneumonia / Leukocytosis  - Sepsis criteria met on admission - Source of infection is pneumonia. Strep antigen was positive - Blood culture negative, urine culture showed no growth - Stop azithromycin 06/10/2017 - Continue Rocephin  Active Problems:   Acute respiratory failure with hypoxia - Likely in the setting of streptococcus pneumonia - Added Pulmicort and Brovana nebulizer twice daily 8/20 - Added Solu-Medrol 60 mg IV every 6 hours 8/20 - Respiratory status is better this morning - Continue DuoNeb every 4 hours scheduled and every 2 hours as needed for shortness of breath or wheezing - Continue oxygen support via nasal cannula to keep oxygen saturation above 90%    Alcohol abuse - Continue CIWA protocol - Continue thiamine, folic acid and multivitamin - No withdrawals    Hypokalemia - Due to sepsis - Supplemented and within normal limit    Acute kidney injury superimposed on CKD stage 3 - Creatinine is 1.2 - Creatinine resolved with IV fluids    UTI - Large leukocytes on urinalysis on admission  - Urine culture negative  - Patient is on Rocephin     Depression - Continue celexa, prozac - Stable     DVT prophylaxis: Lovenox subQ Code Status: full code  Family Communication: no family at the bedside Disposition Plan: home once resp status stable    Consultants:   None   Procedures:   None    Antimicrobials:   Azithro 8/16  --> 8/18  Rocephin 06/08/2017 -->   Subjective: Feels little better this am.  Objective: Vitals:   06/12/17 1359 06/12/17 2046 06/12/17 2231 06/13/17 0450  BP: (!) 154/83 (!) 159/77  132/84  Pulse: 88 83  85  Resp: '16 20  20  '$ Temp: (!) 97.5 F (36.4 C) 98.4 F (36.9 C)  98 F (36.7 C)  TempSrc: Oral Oral  Oral  SpO2: 95% 94% 93% 90%  Weight:      Height:        Intake/Output Summary (Last 24 hours) at 06/13/17 0757 Last data filed at 06/12/17 1527  Gross per 24 hour  Intake              360 ml  Output             1000 ml  Net             -640 ml   Filed Weights   06/09/17 0513  Weight: 127 kg (280 lb)    Physical Exam  Constitutional: Appears well-developed and well-nourished. No distress.  CVS: RRR, S1/S2 + Pulmonary: wheezing in upper and mid lung lobes, coarse breath sounds  Abdominal: Soft. BS +,  no distension, tenderness, rebound or guarding.  Musculoskeletal: Normal range of motion. No edema and no tenderness.  Lymphadenopathy: No lymphadenopathy noted, cervical, inguinal. Neuro: Alert. Normal reflexes, muscle tone coordination. No cranial nerve deficit. Skin: Skin is warm and dry. No rash  noted. Not diaphoretic. No erythema. No pallor.  Psychiatric: Normal mood and affect. Behavior, judgment, thought content normal.     Data Reviewed: I have personally reviewed following labs and imaging studies  CBC:  Recent Labs Lab 06/09/17 0541 06/10/17 0757 06/11/17 0555 06/12/17 0759 06/13/17 0624  WBC 22.6* 16.4* 11.9* 11.9* 12.9*  HGB 11.0* 9.3* 9.3* 9.6* 10.3*  HCT 33.2* 28.8* 28.1* 29.2* 31.3*  MCV 93.0 93.2 93.4 91.5 91.3  PLT 232 287 281 366 449*   Basic Metabolic Panel:  Recent Labs Lab 06/09/17 0541 06/10/17 0603 06/11/17 0555 06/12/17 0759 06/13/17 0624  NA 135 136 139 138 141  K 3.2* 4.6 3.3* 3.6 3.9  CL 104 107 106 104 104  CO2 21* 20* '25 27 25  '$ GLUCOSE 139* 96 104* 112* 205*  BUN 45* 22* '12 8 12  '$ CREATININE 1.68* 1.11* 0.97 0.87  0.87  CALCIUM 7.8* 7.9* 8.4* 8.5* 8.7*  MG  --   --   --  1.9  --    GFR: Estimated Creatinine Clearance: 95 mL/min (by C-G formula based on SCr of 0.87 mg/dL). Liver Function Tests:  Recent Labs Lab 06/08/17 2113  AST 40  ALT 22  ALKPHOS 86  BILITOT 0.5  PROT 6.7  ALBUMIN 3.6    Recent Labs Lab 06/08/17 2113  LIPASE 18   No results for input(s): AMMONIA in the last 168 hours. Coagulation Profile: No results for input(s): INR, PROTIME in the last 168 hours. Cardiac Enzymes: No results for input(s): CKTOTAL, CKMB, CKMBINDEX, TROPONINI in the last 168 hours. BNP (last 3 results) No results for input(s): PROBNP in the last 8760 hours. HbA1C: No results for input(s): HGBA1C in the last 72 hours. CBG: No results for input(s): GLUCAP in the last 168 hours. Lipid Profile: No results for input(s): CHOL, HDL, LDLCALC, TRIG, CHOLHDL, LDLDIRECT in the last 72 hours. Thyroid Function Tests: No results for input(s): TSH, T4TOTAL, FREET4, T3FREE, THYROIDAB in the last 72 hours. Anemia Panel: No results for input(s): VITAMINB12, FOLATE, FERRITIN, TIBC, IRON, RETICCTPCT in the last 72 hours. Urine analysis:    Component Value Date/Time   COLORURINE YELLOW 06/09/2017 0526   APPEARANCEUR CLEAR 06/09/2017 0526   LABSPEC 1.010 06/09/2017 0526   PHURINE 5.0 06/09/2017 0526   GLUCOSEU NEGATIVE 06/09/2017 0526   HGBUR MODERATE (A) 06/09/2017 0526   BILIRUBINUR NEGATIVE 06/09/2017 0526   BILIRUBINUR negative 11/16/2016 1521   BILIRUBINUR neg 04/14/2015 1702   KETONESUR NEGATIVE 06/09/2017 0526   PROTEINUR 30 (A) 06/09/2017 0526   UROBILINOGEN 0.2 11/16/2016 1521   UROBILINOGEN 0.2 04/23/2015 1058   NITRITE NEGATIVE 06/09/2017 0526   LEUKOCYTESUR LARGE (A) 06/09/2017 0526   Sepsis Labs: '@LABRCNTIP'$ (procalcitonin:4,lacticidven:4)   ) Recent Results (from the past 240 hour(s))  Culture, blood (Routine X 2) w Reflex to ID Panel     Status: None (Preliminary result)   Collection  Time: 06/09/17 12:24 AM  Result Value Ref Range Status   Specimen Description BLOOD RIGHT ANTECUBITAL  Final   Special Requests   Final    BOTTLES DRAWN AEROBIC AND ANAEROBIC Blood Culture adequate volume   Culture   Final    NO GROWTH 3 DAYS Performed at [facility], [place] [address] [place], [place] [address]    Report Status PENDING  Incomplete  Culture, sputum-assessment     Status: None   Collection Time: 06/09/17  5:15 AM  Result Value Ref Range Status   Specimen Description SPUTUM  Final  Special Requests NONE  Final   Sputum evaluation THIS SPECIMEN IS ACCEPTABLE FOR SPUTUM CULTURE  Final   Report Status 06/09/2017 FINAL  Final  MRSA PCR Screening     Status: None   Collection Time: 06/09/17  5:15 AM  Result Value Ref Range Status   MRSA by PCR NEGATIVE NEGATIVE Final    Comment:        The GeneXpert MRSA Assay (FDA approved for NASAL specimens only), is one component of a comprehensive MRSA colonization surveillance program. It is not intended to diagnose MRSA infection nor to guide or monitor treatment for MRSA infections.   Culture, respiratory (NON-Expectorated)     Status: None   Collection Time: 06/09/17  5:15 AM  Result Value Ref Range Status   Specimen Description SPUTUM  Final   Special Requests NONE Reflexed from [id number]  Final   Gram Stain   Final    RARE WBC PRESENT, PREDOMINANTLY PMN FEW GRAM POSITIVE COCCI RARE GRAM VARIABLE ROD RARE SQUAMOUS EPITHELIAL CELLS PRESENT    Culture   Final    Consistent with normal respiratory flora. Performed at [facility], [place] [address] [place], [place] [address]    Report Status 06/11/2017 FINAL  Final  Culture, blood (Routine X 2) w Reflex to ID Panel     Status: None (Preliminary result)   Collection Time: 06/09/17  5:27 AM  Result Value Ref Range Status   Specimen Description BLOOD BLOOD RIGHT ARM  Final   Special Requests IN PEDIATRIC BOTTLE Blood Culture adequate volume  Final   Culture    Final    NO GROWTH 3 DAYS Performed at [facility], [place] [address], [place], [place] [address]    Report Status PENDING  Incomplete  Culture, Urine     Status: None   Collection Time: 06/10/17  1:08 PM  Result Value Ref Range Status   Specimen Description URINE, CLEAN CATCH  Final   Special Requests NONE  Final   Culture   Final    NO GROWTH Performed at [facility], [place] [address], [place], [place] [address]    Report Status 06/11/2017 FINAL  Final      Radiology Studies: Dg Chest Port 1 View  Result Date: 06/11/2017 CLINICAL DATA:  Acute respiratory distress. EXAM: PORTABLE CHEST 1 VIEW COMPARISON:  November 16, 2016 FINDINGS: No pneumothorax. The cardiac silhouette is stable. There is tortuous thoracic aorta. This is more prominent in the interval but the difference is likely due to the portable technique. The patient may benefit from a PA and lateral chest x-ray before discharge. New opacity in the left base. No other acute abnormalities. IMPRESSION: 1. Increased prominence of the torturous thoracic aorta is likely technical in nature. The patient may benefit from a PA and lateral chest x-ray before discharge. 2. New opacity in the left base. Recommend clinical correlation and follow-up to resolution. Electronically Signed   By: Dorise Bullion III M.D   On: 06/11/2017 07:13        Scheduled Meds: . acyclovir ointment   Topical Q3H  . arformoterol  15 mcg Nebulization BID  . benzonatate  200 mg Oral BID  . budesonide (PULMICORT) nebulizer solution  0.25 mg Nebulization BID  . DULoxetine  60 mg Oral Daily  . enoxaparin (LOVENOX) injection  40 mg Subcutaneous Q24H  . FLUoxetine  20 mg Oral Daily  . folic acid  1 mg Oral Daily  . guaiFENesin  600  mg Oral BID  . ipratropium-albuterol  3 mL Nebulization Q6H  . loratadine  10 mg Oral Daily  . methylPREDNISolone (SOLU-MEDROL) injection  60 mg Intravenous Q6H  . multivitamin with minerals  1 tablet Oral Daily  .  senna  1 tablet Oral Daily  . thiamine  100 mg Oral Daily   Continuous Infusions: . sodium chloride Stopped (06/11/17 0752)  . cefTRIAXone (ROCEPHIN)  IV Stopped (06/12/17 2203)     LOS: 3 days    Time spent: 25 minutes  Greater than 50% of the time spent on counseling and coordinating the care.   Leisa Lenz, MD [facility] Pager [phone]  If 7PM-7AM, please contact night-coverage [email or web address] Password [facility] 06/13/2017, 7:57 AM

## 2017-06-14 ENCOUNTER — Inpatient Hospital Stay (HOSPITAL_COMMUNITY): Payer: BC Managed Care – PPO

## 2017-06-14 DIAGNOSIS — F1010 Alcohol abuse, uncomplicated: Secondary | ICD-10-CM

## 2017-06-14 DIAGNOSIS — N179 Acute kidney failure, unspecified: Secondary | ICD-10-CM

## 2017-06-14 DIAGNOSIS — R0603 Acute respiratory distress: Secondary | ICD-10-CM

## 2017-06-14 LAB — CULTURE, BLOOD (ROUTINE X 2)
CULTURE: NO GROWTH
Culture: NO GROWTH
SPECIAL REQUESTS: ADEQUATE
Special Requests: ADEQUATE

## 2017-06-14 MED ORDER — PREDNISOLONE 5 MG PO TABS: 40.0000 mg | ORAL_TABLET | Freq: Every day | ORAL | Status: DC

## 2017-06-14 MED ORDER — POLYETHYLENE GLYCOL 3350 17 G PO PACK
17.0000 g | PACK | Freq: Two times a day (BID) | ORAL | Status: DC | PRN
  Administered 2017-06-14: 17 g via ORAL
  Filled 2017-06-14: qty 1

## 2017-06-14 MED ORDER — PREDNISONE 20 MG PO TABS
40.0000 mg | ORAL_TABLET | Freq: Every day | ORAL | Status: DC
  Administered 2017-06-15: 40 mg via ORAL
  Filled 2017-06-14: qty 2

## 2017-06-14 NOTE — Progress Notes (Signed)
PROGRESS NOTE  [name] PMV:[id number] DOB: 10/19/1956 DOA: 06/08/2017 PCP: Sherren Mocha, MD   LOS: 4 days   Brief Narrative / Interim history: 61 year old female with anxiety, depression, gastric bypass, OSA not using CPAP who presented with left sided abd pain for past 2 days. She was found to have pneumonia and started on azithro and Rocephin. Her strep ag was positive.  Assessment & Plan: Principal Problem:   Sepsis (HCC) Active Problems:   Alcohol abuse   Depression   Chronic anemia   Chronic pain syndrome   CAP (community acquired pneumonia)   AKI (acute kidney injury) (HCC)   Pneumonia   Sepsis due to UTI and Lobar pneumonia / streptococcus pneumonia / Leukocytosis -Sepsis criteria met on admission, due to pneumonia.  Strep antigen was positive. -Continue Rocephin, she is improving however still short of breath with minimal ambulation  Acute respiratory failure with hypoxia -Due to Streptococcus pneumonia -Continue nebulizer, continue steroids, will transition to p.o. today -Continues to desat into the low 80s ambulating barely 10 feet today -May need oxygen on discharge,  Alcohol abuse -Does not trigger CIWA  Acute kidney injury superimposed on CKD stage 3 -Resolved, creatinine 0.87 this morning  UTI -Large leukocytes on urinalysis on admission  -Urine culture negative  -Patient is on Rocephin   Depression -Continue celexa, prozac -Stable    DVT prophylaxis: Lovenox Code Status: Full code Family Communication: No family at bedside Disposition Plan: Home likely in 1 day  Consultants:   None  Procedures:   None  Antimicrobials:  Azithromycin 8/16 >> 8/18  Ceftriaxone 8/16 >>  Subjective: - no chest pain, shortness of breath, no abdominal pain, nausea or vomiting. Wants to go home   Objective: Vitals:   06/14/17 0600 06/14/17 0842 06/14/17 0854 06/14/17 0901  BP: (!) 144/77     Pulse: 63     Resp:      Temp: 98.5 F (36.9 C)      TempSrc: Oral     SpO2: 90% 92% 92% 92%  Weight:      Height:        Intake/Output Summary (Last 24 hours) at 06/14/17 1320 Last data filed at 06/14/17 0857  Gross per 24 hour  Intake                0 ml  Output              400 ml  Net             -400 ml   Filed Weights   06/09/17 0513  Weight: 127 kg (280 lb)    Examination:  Vitals:   06/14/17 0600 06/14/17 0842 06/14/17 0854 06/14/17 0901  BP: (!) 144/77     Pulse: 63     Resp:      Temp: 98.5 F (36.9 C)     TempSrc: Oral     SpO2: 90% 92% 92% 92%  Weight:      Height:        Constitutional: NAD ENMT: Mucous membranes are moist.  Neck: normal, supple Respiratory: Rhonchi present in the left lower lung, no wheezing, no crackles.  Normal respiratory effort Cardiovascular: Regular rate and rhythm, no murmurs / rubs / gallops. No LE edema. 2+ pedal pulses. No carotid bruits.  Abdomen: no tenderness. Bowel sounds positive.  Skin: no rashes, lesions, ulcers. No induration Neurologic: CN 2-12 grossly intact. Strength 5/5 in all 4.  Psychiatric: Normal judgment and  insight. Alert and oriented x 3. Normal mood.    Data Reviewed: I have independently reviewed following labs and imaging studies   CBC:  Recent Labs Lab 06/09/17 0541 06/10/17 0757 06/11/17 0555 06/12/17 0759 06/13/17 0624  WBC 22.6* 16.4* 11.9* 11.9* 12.9*  HGB 11.0* 9.3* 9.3* 9.6* 10.3*  HCT 33.2* 28.8* 28.1* 29.2* 31.3*  MCV 93.0 93.2 93.4 91.5 91.3  PLT 232 287 281 366 119*   Basic Metabolic Panel:  Recent Labs Lab 06/09/17 0541 06/10/17 0603 06/11/17 0555 06/12/17 0759 06/13/17 0624  NA 135 136 139 138 141  K 3.2* 4.6 3.3* 3.6 3.9  CL 104 107 106 104 104  CO2 21* 20* '25 27 25  '$ GLUCOSE 139* 96 104* 112* 205*  BUN 45* 22* '12 8 12  '$ CREATININE 1.68* 1.11* 0.97 0.87 0.87  CALCIUM 7.8* 7.9* 8.4* 8.5* 8.7*  MG  --   --   --  1.9  --    GFR: Estimated Creatinine Clearance: 95 mL/min (by C-G formula based on SCr of 0.87  mg/dL). Liver Function Tests:  Recent Labs Lab 06/08/17 2113  AST 40  ALT 22  ALKPHOS 86  BILITOT 0.5  PROT 6.7  ALBUMIN 3.6    Recent Labs Lab 06/08/17 2113  LIPASE 18   No results for input(s): AMMONIA in the last 168 hours. Coagulation Profile: No results for input(s): INR, PROTIME in the last 168 hours. Cardiac Enzymes: No results for input(s): CKTOTAL, CKMB, CKMBINDEX, TROPONINI in the last 168 hours. BNP (last 3 results) No results for input(s): PROBNP in the last 8760 hours. HbA1C: No results for input(s): HGBA1C in the last 72 hours. CBG: No results for input(s): GLUCAP in the last 168 hours. Lipid Profile: No results for input(s): CHOL, HDL, LDLCALC, TRIG, CHOLHDL, LDLDIRECT in the last 72 hours. Thyroid Function Tests: No results for input(s): TSH, T4TOTAL, FREET4, T3FREE, THYROIDAB in the last 72 hours. Anemia Panel: No results for input(s): VITAMINB12, FOLATE, FERRITIN, TIBC, IRON, RETICCTPCT in the last 72 hours. Urine analysis:    Component Value Date/Time   COLORURINE YELLOW 06/09/2017 0526   APPEARANCEUR CLEAR 06/09/2017 0526   LABSPEC 1.010 06/09/2017 0526   PHURINE 5.0 06/09/2017 0526   GLUCOSEU NEGATIVE 06/09/2017 0526   HGBUR MODERATE (A) 06/09/2017 0526   BILIRUBINUR NEGATIVE 06/09/2017 0526   BILIRUBINUR negative 11/16/2016 1521   BILIRUBINUR neg 04/14/2015 1702   KETONESUR NEGATIVE 06/09/2017 0526   PROTEINUR 30 (A) 06/09/2017 0526   UROBILINOGEN 0.2 11/16/2016 1521   UROBILINOGEN 0.2 04/23/2015 1058   NITRITE NEGATIVE 06/09/2017 0526   LEUKOCYTESUR LARGE (A) 06/09/2017 0526   Sepsis Labs: Invalid input(s): PROCALCITONIN, LACTICIDVEN  Recent Results (from the past 240 hour(s))  Culture, blood (Routine X 2) w Reflex to ID Panel     Status: None   Collection Time: 06/09/17 12:24 AM  Result Value Ref Range Status   Specimen Description BLOOD RIGHT ANTECUBITAL  Final   Special Requests   Final    BOTTLES DRAWN AEROBIC AND ANAEROBIC  Blood Culture adequate volume   Culture   Final    NO GROWTH 5 DAYS Performed at [facility], [address], [place], [place] [address]    Report Status 06/14/2017 FINAL  Final  Culture, sputum-assessment     Status: None   Collection Time: 06/09/17  5:15 AM  Result Value Ref Range Status   Specimen Description SPUTUM  Final   Special Requests NONE  Final   Sputum evaluation THIS  SPECIMEN IS ACCEPTABLE FOR SPUTUM CULTURE  Final   Report Status 06/09/2017 FINAL  Final  MRSA PCR Screening     Status: None   Collection Time: 06/09/17  5:15 AM  Result Value Ref Range Status   MRSA by PCR NEGATIVE NEGATIVE Final    Comment:        The GeneXpert MRSA Assay (FDA approved for NASAL specimens only), is one component of a comprehensive MRSA colonization surveillance program. It is not intended to diagnose MRSA infection nor to guide or monitor treatment for MRSA infections.   Culture, respiratory (NON-Expectorated)     Status: None   Collection Time: 06/09/17  5:15 AM  Result Value Ref Range Status   Specimen Description SPUTUM  Final   Special Requests NONE Reflexed from [id number]  Final   Gram Stain   Final    RARE WBC PRESENT, PREDOMINANTLY PMN FEW GRAM POSITIVE COCCI RARE GRAM VARIABLE ROD RARE SQUAMOUS EPITHELIAL CELLS PRESENT    Culture   Final    Consistent with normal respiratory flora. Performed at [facility], [place] [address] [place], [place] [address]    Report Status 06/11/2017 FINAL  Final  Culture, blood (Routine X 2) w Reflex to ID Panel     Status: None   Collection Time: 06/09/17  5:27 AM  Result Value Ref Range Status   Specimen Description BLOOD BLOOD RIGHT ARM  Final   Special Requests IN PEDIATRIC BOTTLE Blood Culture adequate volume  Final   Culture   Final    NO GROWTH 5 DAYS Performed at [facility], [place] [address] [place], [place] [address]    Report Status 06/14/2017 FINAL  Final  Culture, Urine     Status: None   Collection  Time: 06/10/17  1:08 PM  Result Value Ref Range Status   Specimen Description URINE, CLEAN CATCH  Final   Special Requests NONE  Final   Culture   Final    NO GROWTH Performed at [facility], [place] [address], [place], [place] [address]    Report Status 06/11/2017 FINAL  Final      Radiology Studies: No results found.   Scheduled Meds: . acyclovir ointment   Topical Q3H  . arformoterol  15 mcg Nebulization BID  . benzonatate  200 mg Oral BID  . budesonide (PULMICORT) nebulizer solution  0.25 mg Nebulization BID  . DULoxetine  60 mg Oral Daily  . enoxaparin (LOVENOX) injection  40 mg Subcutaneous Q24H  . FLUoxetine  20 mg Oral Daily  . folic acid  1 mg Oral Daily  . guaiFENesin  600 mg Oral BID  . ipratropium-albuterol  3 mL Nebulization TID  . loratadine  10 mg Oral Daily  . multivitamin with minerals  1 tablet Oral Daily  . [START ON 06/15/2017] predniSONE  40 mg Oral Q breakfast  . senna  1 tablet Oral Daily  . thiamine  100 mg Oral Daily   Continuous Infusions: . sodium chloride Stopped (06/11/17 0752)  . cefTRIAXone (ROCEPHIN)  IV Stopped (06/13/17 2159)    Marzetta Board, MD, PhD [facility] Pager [phone]  If 7PM-7AM, please contact night-coverage [email or web address] Password [email or web address] 06/14/2017, 1:20 PM

## 2017-06-15 DIAGNOSIS — J181 Lobar pneumonia, unspecified organism: Secondary | ICD-10-CM

## 2017-06-15 MED ORDER — LEVOFLOXACIN 500 MG PO TABS: 500.0000 mg | ORAL_TABLET | Freq: Every day | ORAL | 0 refills | Status: AC

## 2017-06-15 MED ORDER — PREDNISONE 20 MG PO TABS: 20.0000 mg | ORAL_TABLET | Freq: Every day | ORAL | 0 refills | Status: DC

## 2017-06-15 MED ORDER — ALBUTEROL SULFATE HFA 108 (90 BASE) MCG/ACT IN AERS: 2.0000 | INHALATION_SPRAY | RESPIRATORY_TRACT | 2 refills | Status: DC | PRN

## 2017-06-15 NOTE — Discharge Summary (Signed)
 Physician Discharge Summary  Bethan T Standifer MRN:[id number] DOB: 02/25/1956 DOA: 06/08/2017  PCP: Shaw, Eva N, MD  Admit date: 06/08/2017 Discharge date: 06/15/2017  Admitted From: home Disposition:  home  Recommendations for Outpatient Follow-up:  1. Follow up with PCP in 1-2 weeks 2. Continue Levaquin for 2 more days 3. Continue Prednisone quick taper for 3 days  Home Health: none  Equipment/Devices: none  Discharge Condition: stable CODE STATUS: Full code Diet recommendation: regular  HPI: Per Dr. Smith, [name] is a 61 y.o. female with medical history significant of anxiety, depression, obesity s/p gastric bypass, and chronic pain, OSA not using CPAP; who presents with complaints of left-sided abdominal pain for the last 2 days. Patient reports  pain symptoms started all of a sudden and have been constant in the left upper quadrant abdomen. Palpation over the area reproduces sharp pain. Taking a deep breath also seems to worsen symptoms. Associated symptoms include fever up to 101F, generalized malaise, diarrhea, shortness of breath, and productive cough with brown sputum. Patient reports taking Tylenol for the fever and Imodium for diarrhea with improvement of symptoms. Denies having any significant chest pain, lightheadedness, palpitations, change in weight, leg swelling, recent antibiotics, or vomiting. Patient reports drinking 1- 2 beers per day on average. She denies any history of withdrawals ED Course: Upon admission to the emergency department patient was seen to have a temperature of 99.7F, pulse 96-102, respirations 17-21, blood pressure maintained and O2 saturation maintained on room air. Labs revealed WBC 25.4, hemoglobin 11.5, potassium 3.3, BUN 48, creatinine 1.94, and lipase within normal limits.  A CT scan of the abdomen was obtained given concern for diverticulitis or intra-abdominal cause of symptoms. CT scan revealed signs of the lower lobe pneumonia. Patient was  started on empiric antibiotics of ceftriaxone and azithromycin.  Hospital Course: Discharge Diagnoses:  Principal Problem:   Sepsis (HCC) Active Problems:   Alcohol abuse   Depression   Chronic anemia   Chronic pain syndrome   CAP (community acquired pneumonia)   AKI (acute kidney injury) (HCC)   Pneumonia   Sepsis due to Lobar pneumonia / streptococcus pneumonia -Sepsis criteria met on admission, due to pneumonia.  Strep antigen was positive.  Patient was maintained on ceftriaxone while hospitalized, she improved clinically, her symptoms have resolved and she was no longer in need for additional supplemental oxygen.  On the day of discharge she was able to ambulate in the hallway on room air, her antibiotics were narrowed to Levaquin and she is to complete a total of 7 day course.  She was started on prednisone to assist with her pneumonia, and she will be transitioned for quick taper after discharge. Acute respiratory failure with hypoxia -Due to Streptococcus pneumonia, her hypoxia resolved and she was stable on room air on discharge. ETOH use - did not trigger CIWA Acute kidney injury superimposed on CKD stage 3 -Resolved, creatinine within normal limits on discharge UTI -Large leukocytes on urinalysis on admission, Urine culture negative so unlikely true infection. Depression -Continue celexa, prozac  Discharge Instructions  Allergies as of 06/15/2017   No Known Allergies     Medication List    STOP taking these medications   amoxicillin-clavulanate 875-125 MG tablet Commonly known as:  AUGMENTIN     TAKE these medications   acetaminophen 500 MG tablet Commonly known as:  TYLENOL Take 1,000 mg by mouth every 6 (six) hours as needed for moderate pain or headache.   acetaminophen-codeine 300-30 MG   tablet Commonly known as:  TYLENOL #3 Take 1 tablet by mouth every 8 (eight) hours as needed for moderate pain.   acyclovir ointment 5 % Commonly known as:  ZOVIRAX Apply  topically every 3 (three) hours.   albuterol 108 (90 Base) MCG/ACT inhaler Commonly known as:  PROVENTIL HFA;VENTOLIN HFA Inhale 2 puffs into the lungs every 4 (four) hours as needed for wheezing or shortness of breath (cough, shortness of breath or wheezing.).   amphetamine-dextroamphetamine 30 MG 24 hr capsule Commonly known as:  ADDERALL XR Take 30 mg by mouth daily.   benzonatate 200 MG capsule Commonly known as:  TESSALON Take 1 capsule (200 mg total) by mouth 3 (three) times daily as needed for cough.   DULoxetine 60 MG capsule Commonly known as:  CYMBALTA Take 1 capsule (60 mg total) by mouth daily.   EPINEPHrine 0.3 mg/0.3 mL Soaj injection Commonly known as:  EPI-PEN Inject 0.3 mLs (0.3 mg total) into the muscle once. What changed:  when to take this  reasons to take this   FLUoxetine 20 MG tablet Commonly known as:  PROZAC Take 20 mg by mouth daily.   levofloxacin 500 MG tablet Commonly known as:  LEVAQUIN Take 1 tablet (500 mg total) by mouth daily.   predniSONE 20 MG tablet Commonly known as:  DELTASONE Take 1 tablet (20 mg total) by mouth daily with breakfast.   traZODone 50 MG tablet Commonly known as:  DESYREL TAKE 50 MG BY MOUTH DAILY AT BEDTIME AS NEEDED FOR SLEEP            Discharge Care Instructions        Start     Ordered   06/16/17 0000  predniSONE (DELTASONE) 20 MG tablet  Daily with breakfast     06/15/17 1137   06/15/17 0000  levofloxacin (LEVAQUIN) 500 MG tablet  Daily     06/15/17 1137   06/15/17 0000  albuterol (PROVENTIL HFA;VENTOLIN HFA) 108 (90 Base) MCG/ACT inhaler  Every 4 hours PRN     06/15/17 1137      No Known Allergies  Consultations:  None   Procedures/Studies:  Ct Abdomen Pelvis Wo Contrast  Result Date: 06/09/2017 CLINICAL DATA:  Lower abdominal pain, fatigue and diarrhea EXAM: CT ABDOMEN AND PELVIS WITHOUT CONTRAST TECHNIQUE: Multidetector CT imaging of the abdomen and pelvis was performed following the  standard protocol without IV contrast. COMPARISON:  None. FINDINGS: Lower chest: Dense left lower lobe consolidation. No significant pleural effusion. Patchy minimal ground-glass density in the right lower lobe. 5 mm right lower lobe subpleural pulmonary nodule, series 3, image number 12. Normal heart size. Hepatobiliary: No focal liver abnormality is seen. No gallstones, gallbladder wall thickening, or biliary dilatation. Pancreas: Unremarkable. No pancreatic ductal dilatation or surrounding inflammatory changes. Spleen: Normal in size without focal abnormality. Adrenals/Urinary Tract: Adrenal glands are unremarkable. Kidneys are normal, without renal calculi, focal lesion, or hydronephrosis. Bladder is unremarkable. Stomach/Bowel: Status post gastric bypass. Negative for small bowel obstruction. No significant colon wall thickening. Vascular/Lymphatic: Aortic atherosclerosis. No enlarged abdominal or pelvic lymph nodes. Reproductive: No adnexal masses. Partially calcified uterine mass presumably a fibroid. Other: Negative for free air or free fluid. Musculoskeletal: Diffuse degenerative changes. Trace anterolisthesis of L4 on L5. IMPRESSION: 1. Dense left lower lobe consolidation is suspicious for a pneumonia. Additional patchy minimal ground-glass density in the right lower lobe may reflect additional small foci of infection/inflammation. 2. 5 mm right lower lobe subpleural pulmonary nodule. No follow-up needed if patient   is low-risk. Non-contrast chest CT can be considered in 12 months if patient is high-risk. This recommendation follows the consensus statement: Guidelines for Management of Incidental Pulmonary Nodules Detected on CT Images: From the Fleischner Society 2017; Radiology 2017; 284:228-243. 3. Negative for bowel obstruction or bowel wall thickening. Postsurgical changes consistent with prior gastric bypass 4. Calcified uterine mass likely a fibroid Electronically Signed   By: Kim  Fujinaga M.D.    On: 06/09/2017 01:37   Dg Chest 2 View  Result Date: 06/14/2017 CLINICAL DATA:  Chest pain and shortness of breath EXAM: CHEST  2 VIEW COMPARISON:  June 11, 2017 ; December 09, 2015 FINDINGS: There is atelectatic change in both mid lung regions and left base. There is fibrotic type change in the lower lung zones, more severe on the left than on the right. There is no appreciable airspace consolidation. Heart size and pulmonary vascularity are normal. No adenopathy. There is postoperative change in the lower cervical spine. IMPRESSION: Areas of atelectatic change bilaterally. No consolidation. Areas of fibrotic change, more on the left than on the right, primarily in the base regions. It is difficult to exclude a degree of mild bibasilar interstitial edema. Heart size within normal limits.  No evident adenopathy. Electronically Signed   By: William  Woodruff III M.D.   On: 06/14/2017 13:49   Dg Chest Port 1 View  Result Date: 06/11/2017 CLINICAL DATA:  Acute respiratory distress. EXAM: PORTABLE CHEST 1 VIEW COMPARISON:  November 16, 2016 FINDINGS: No pneumothorax. The cardiac silhouette is stable. There is tortuous thoracic aorta. This is more prominent in the interval but the difference is likely due to the portable technique. The patient may benefit from a PA and lateral chest x-ray before discharge. New opacity in the left base. No other acute abnormalities. IMPRESSION: 1. Increased prominence of the torturous thoracic aorta is likely technical in nature. The patient may benefit from a PA and lateral chest x-ray before discharge. 2. New opacity in the left base. Recommend clinical correlation and follow-up to resolution. Electronically Signed   By: David  Williams III M.D   On: 06/11/2017 07:13     Subjective: - no chest pain, shortness of breath, no abdominal pain, nausea or vomiting.   Discharge Exam: Vitals:   06/15/17 0423 06/15/17 0852  BP: 127/77   Pulse: 68 72  Resp: 16 18  Temp: 98.3  F (36.8 C)   SpO2: 94% 90%   Vitals:   06/14/17 2029 06/14/17 2139 06/15/17 0423 06/15/17 0852  BP:  113/66 127/77   Pulse:  92 68 72  Resp:  20 16 18  Temp:  99 F (37.2 C) 98.3 F (36.8 C)   TempSrc:  Oral Oral   SpO2: 90% 92% 94% 90%  Weight:      Height:        General: Pt is alert, awake, not in acute distress Cardiovascular: RRR, S1/S2 +, no rubs, no gallops Respiratory: CTA bilaterally, no wheezing, no rhonchi Abdominal: Soft, NT, ND, bowel sounds + Extremities: no edema, no cyanosis    The results of significant diagnostics from this hospitalization (including imaging, microbiology, ancillary and laboratory) are listed below for reference.     Microbiology: Recent Results (from the past 240 hour(s))  Culture, blood (Routine X 2) w Reflex to ID Panel     Status: None   Collection Time: 06/09/17 12:24 AM  Result Value Ref Range Status   Specimen Description BLOOD RIGHT ANTECUBITAL  Final   Special Requests     Final    BOTTLES DRAWN AEROBIC AND ANAEROBIC Blood Culture adequate volume   Culture   Final    NO GROWTH 5 DAYS Performed at [facility], [place] [address], [place], [place] [address]    Report Status 06/14/2017 FINAL  Final  Culture, sputum-assessment     Status: None   Collection Time: 06/09/17  5:15 AM  Result Value Ref Range Status   Specimen Description SPUTUM  Final   Special Requests NONE  Final   Sputum evaluation THIS SPECIMEN IS ACCEPTABLE FOR SPUTUM CULTURE  Final   Report Status 06/09/2017 FINAL  Final  MRSA PCR Screening     Status: None   Collection Time: 06/09/17  5:15 AM  Result Value Ref Range Status   MRSA by PCR NEGATIVE NEGATIVE Final    Comment:        The GeneXpert MRSA Assay (FDA approved for NASAL specimens only), is one component of a comprehensive MRSA colonization surveillance program. It is not intended to diagnose MRSA infection nor to guide or monitor treatment for MRSA infections.   Culture, respiratory  (NON-Expectorated)     Status: None   Collection Time: 06/09/17  5:15 AM  Result Value Ref Range Status   Specimen Description SPUTUM  Final   Special Requests NONE Reflexed from [id number]  Final   Gram Stain   Final    RARE WBC PRESENT, PREDOMINANTLY PMN FEW GRAM POSITIVE COCCI RARE GRAM VARIABLE ROD RARE SQUAMOUS EPITHELIAL CELLS PRESENT    Culture   Final    Consistent with normal respiratory flora. Performed at [facility], [place] [address] [place], [place] [address]    Report Status 06/11/2017 FINAL  Final  Culture, blood (Routine X 2) w Reflex to ID Panel     Status: None   Collection Time: 06/09/17  5:27 AM  Result Value Ref Range Status   Specimen Description BLOOD BLOOD RIGHT ARM  Final   Special Requests IN PEDIATRIC BOTTLE Blood Culture adequate volume  Final   Culture   Final    NO GROWTH 5 DAYS Performed at [facility], [place] [address] [place], [place] [address]    Report Status 06/14/2017 FINAL  Final  Culture, Urine     Status: None   Collection Time: 06/10/17  1:08 PM  Result Value Ref Range Status   Specimen Description URINE, CLEAN CATCH  Final   Special Requests NONE  Final   Culture   Final    NO GROWTH Performed at [facility], [place] [address], [place], [place] [address]    Report Status 06/11/2017 FINAL  Final     Labs: BNP (last 3 results) No results for input(s): BNP in the last 8760 hours. Basic Metabolic Panel:  Recent Labs Lab 06/09/17 0541 06/10/17 0603 06/11/17 0555 06/12/17 0759 06/13/17 0624  NA 135 136 139 138 141  K 3.2* 4.6 3.3* 3.6 3.9  CL 104 107 106 104 104  CO2 21* 20* _0 GLUCOSE 139* 96 104* 112* 205*  BUN 45* 22* _1 CREATININE 1.68* 1.11* 0.97 0.87 0.87  CALCIUM 7.8* 7.9* 8.4* 8.5* 8.7*  MG  --   --   --  1.9  --    Liver Function Tests:  Recent Labs Lab 06/08/17 2113  AST 40  ALT 22  ALKPHOS 86  BILITOT 0.5  PROT 6.7  ALBUMIN 3.6    Recent Labs Lab 06/08/17 2113  LIPASE 18    No results for input(s): AMMONIA in the last 168 hours. CBC:  Recent Labs Lab 06/09/17 0541 06/10/17 0757 06/11/17 0555 06/12/17 0759 06/13/17 0624  WBC 22.6* 16.4* 11.9* 11.9* 12.9*  HGB 11.0* 9.3* 9.3* 9.6* 10.3*  HCT 33.2* 28.8* 28.1* 29.2* 31.3*  MCV 93.0 93.2 93.4 91.5 91.3  PLT 232 287 281 366 454*   Cardiac Enzymes: No results for input(s): CKTOTAL, CKMB, CKMBINDEX, TROPONINI in the last 168 hours. BNP: Invalid input(s): POCBNP CBG: No results for input(s): GLUCAP in the last 168 hours. D-Dimer No results for input(s): DDIMER in the last 72 hours. Hgb A1c No results for input(s): HGBA1C in the last 72 hours. Lipid Profile No results for input(s): CHOL, HDL, LDLCALC, TRIG, CHOLHDL, LDLDIRECT in the last 72 hours. Thyroid function studies No results for input(s): TSH, T4TOTAL, T3FREE, THYROIDAB in the last 72 hours.  Invalid input(s): FREET3 Anemia work up No results for input(s): VITAMINB12, FOLATE, FERRITIN, TIBC, IRON, RETICCTPCT in the last 72 hours. Urinalysis    Component Value Date/Time   COLORURINE YELLOW 06/09/2017 0526   APPEARANCEUR CLEAR 06/09/2017 0526   LABSPEC 1.010 06/09/2017 0526   PHURINE 5.0 06/09/2017 0526   GLUCOSEU NEGATIVE 06/09/2017 0526   HGBUR MODERATE (A) 06/09/2017 0526   BILIRUBINUR NEGATIVE 06/09/2017 0526   BILIRUBINUR negative 11/16/2016 1521   BILIRUBINUR neg 04/14/2015 1702   KETONESUR NEGATIVE 06/09/2017 0526   PROTEINUR 30 (A) 06/09/2017 0526   UROBILINOGEN 0.2 11/16/2016 1521   UROBILINOGEN 0.2 04/23/2015 1058   NITRITE NEGATIVE 06/09/2017 0526   LEUKOCYTESUR LARGE (A) 06/09/2017 0526   Sepsis Labs Invalid input(s): PROCALCITONIN,  WBC,  LACTICIDVEN Microbiology Recent Results (from the past 240 hour(s))  Culture, blood (Routine X 2) w Reflex to ID Panel     Status: None   Collection Time: 06/09/17 12:24 AM  Result Value Ref Range Status   Specimen Description BLOOD RIGHT ANTECUBITAL  Final   Special Requests    Final    BOTTLES DRAWN AEROBIC AND ANAEROBIC Blood Culture adequate volume   Culture   Final    NO GROWTH 5 DAYS Performed at [facility], [address] [place], [place] [address]    Report Status 06/14/2017 FINAL  Final  Culture, sputum-assessment     Status: None   Collection Time: 06/09/17  5:15 AM  Result Value Ref Range Status   Specimen Description SPUTUM  Final   Special Requests NONE  Final   Sputum evaluation THIS SPECIMEN IS ACCEPTABLE FOR SPUTUM CULTURE  Final   Report Status 06/09/2017 FINAL  Final  MRSA PCR Screening     Status: None   Collection Time: 06/09/17  5:15 AM  Result Value Ref Range Status   MRSA by PCR NEGATIVE NEGATIVE Final    Comment:        The GeneXpert MRSA Assay (FDA approved for NASAL specimens only), is one component of a comprehensive MRSA colonization surveillance program. It is not intended to diagnose MRSA infection nor to guide or monitor treatment for MRSA infections.   Culture, respiratory (NON-Expectorated)     Status: None   Collection Time: 06/09/17  5:15 AM  Result Value Ref Range Status   Specimen Description SPUTUM  Final   Special Requests NONE Reflexed from [id number]  Final   Gram Stain   Final    RARE WBC PRESENT, PREDOMINANTLY PMN FEW GRAM POSITIVE COCCI RARE GRAM VARIABLE ROD RARE SQUAMOUS EPITHELIAL CELLS PRESENT    Culture     Final    Consistent with normal respiratory flora. Performed at [facility], [place] [address], [place], [place] [address]    Report Status 06/11/2017 FINAL  Final  Culture, blood (Routine X 2) w Reflex to ID Panel     Status: None   Collection Time: 06/09/17  5:27 AM  Result Value Ref Range Status   Specimen Description BLOOD BLOOD RIGHT ARM  Final   Special Requests IN PEDIATRIC BOTTLE Blood Culture adequate volume  Final   Culture   Final    NO GROWTH 5 DAYS Performed at [facility], [place] [address], [place], [place] [address]    Report Status 06/14/2017 FINAL  Final    Culture, Urine     Status: None   Collection Time: 06/10/17  1:08 PM  Result Value Ref Range Status   Specimen Description URINE, CLEAN CATCH  Final   Special Requests NONE  Final   Culture   Final    NO GROWTH Performed at [facility], [place] [address], [place], [place] [address]    Report Status 06/11/2017 FINAL  Final     Time coordinating discharge: 25 minutes  SIGNED:  Marzetta Board, MD  [facility] 06/15/2017, 2:13 PM Pager [phone]  If 7PM-7AM, please contact night-coverage [email or web address] Password [email or web address]

## 2017-06-15 NOTE — Progress Notes (Signed)
Pt ambulated in the hallway and stayed between 92-94% o2 sat.

## 2017-06-15 NOTE — Care Management Note (Signed)
Case Management Note  Patient Details  Name: SHANITRA TELLER MRN: [id number] Date of Birth: 05-05-1956  Subjective/Objective:        pna            Action/Plan: Date:  June 15, 2017  Chart reviewed for concurrent status and case management needs.  Will continue to follow patient progress.  Discharge Planning: following for needs  Expected discharge date: [id number]  Marcelle Smiling, BSN, [place], [place]   [phone]   Expected Discharge Date:                  Expected Discharge Plan:  Home/Self Care  In-House Referral:     Discharge planning Services  CM Consult  Post Acute Care Choice:    Choice offered to:     DME Arranged:    DME Agency:     HH Arranged:    HH Agency:     Status of Service:  In process, will continue to follow  If discussed at Long Length of Stay Meetings, dates discussed:    Additional Comments:  Golda Acre, RN 06/15/2017, 9:37 AM

## 2017-07-27 ENCOUNTER — Ambulatory Visit (INDEPENDENT_AMBULATORY_CARE_PROVIDER_SITE_OTHER): Payer: BC Managed Care – PPO

## 2017-07-27 ENCOUNTER — Encounter (HOSPITAL_COMMUNITY): Payer: Self-pay | Admitting: Emergency Medicine

## 2017-07-27 ENCOUNTER — Ambulatory Visit (HOSPITAL_COMMUNITY)
Admission: EM | Admit: 2017-07-27 | Discharge: 2017-07-27 | Disposition: A | Discharge status: Home / Self Care | Payer: BC Managed Care – PPO | Attending: Internal Medicine | Admitting: Internal Medicine

## 2017-07-27 DIAGNOSIS — R062 Wheezing: Secondary | ICD-10-CM | POA: Diagnosis not present

## 2017-07-27 DIAGNOSIS — R05 Cough: Secondary | ICD-10-CM

## 2017-07-27 DIAGNOSIS — R059 Cough, unspecified: Secondary | ICD-10-CM

## 2017-07-27 DIAGNOSIS — J189 Pneumonia, unspecified organism: Secondary | ICD-10-CM

## 2017-07-27 DIAGNOSIS — J181 Lobar pneumonia, unspecified organism: Secondary | ICD-10-CM

## 2017-07-27 MED ORDER — DM-GUAIFENESIN ER 30-600 MG PO TB12: 1.0000 | ORAL_TABLET | Freq: Two times a day (BID) | ORAL | 0 refills | Status: DC

## 2017-07-27 MED ORDER — PREDNISONE 20 MG PO TABS: 20.0000 mg | ORAL_TABLET | Freq: Two times a day (BID) | ORAL | 0 refills | Status: DC

## 2017-07-27 MED ORDER — IPRATROPIUM-ALBUTEROL 0.5-2.5 (3) MG/3ML IN SOLN
RESPIRATORY_TRACT | Status: AC
  Filled 2017-07-27: qty 3

## 2017-07-27 MED ORDER — IPRATROPIUM-ALBUTEROL 0.5-2.5 (3) MG/3ML IN SOLN
3.0000 mL | Freq: Once | RESPIRATORY_TRACT | Status: AC
Start: 2017-07-27 — End: 2017-07-27
  Administered 2017-07-27: 3 mL via RESPIRATORY_TRACT

## 2017-07-27 MED ORDER — LEVOFLOXACIN 500 MG PO TABS: 500.0000 mg | ORAL_TABLET | Freq: Every day | ORAL | 0 refills | Status: DC

## 2017-07-27 NOTE — ED Triage Notes (Signed)
Pt sts continued cough with recent hospitalization for PNA; denies fever

## 2017-07-27 NOTE — ED Provider Notes (Signed)
[facility]    CSN: [id number] Arrival date & time: 07/27/17  1936     History   Chief Complaint Chief Complaint  Patient presents with  . Cough    HPI [name] is a 61 y.o. female presented to clinic with CC of productive cough with purulent sputum with SOB x 1 week, gradually worsening.  Denies fever/chills. Pt states were admitted in hospital x 8 days for pneumonia 5 weeks ago. Currently using Albuterol Inhaler with no significant relief in Sx. Pt A&Ox3, no acute visible distress. The history is provided by the patient.  Cough  Cough characteristics:  Productive Sputum characteristics:  Green Severity:  Moderate Onset quality:  Gradual Duration:  7 days Timing:  Constant Progression:  Worsening Chronicity:  New Relieved by:  Beta-agonist inhaler Worsened by:  Deep breathing Associated symptoms: shortness of breath   Associated symptoms: no chest pain, no chills and no fever     Past Medical History:  Diagnosis Date  . Anemia   . Arthritis   . Depression   . Fibromyalgia   . MRSA (methicillin resistant Staphylococcus aureus)   . Obese   . Pneumonia   . Seasonal allergies   . Sleep apnea 2012   took test in [place]-told to use cpap-could not ude    Patient Active Problem List   Diagnosis Date Noted  . Pneumonia 06/10/2017  . CAP (community acquired pneumonia) 06/09/2017  . Sepsis (HCC) 06/09/2017  . AKI (acute kidney injury) (HCC) 06/09/2017  . Insomnia   . Chronic pain syndrome   . Cellulitis and abscess of leg 06/19/2016  . 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 Cellulitis of left lower extremity 01/09/2016  . Psoriatic arthritis (HCC)   . Cough   . Wheezing   . Depression 12/05/2015  . Chronic anemia 12/05/2015  . Chronic venous insufficiency 12/04/2015  . Alcohol abuse 11/21/2014  . Anxiety state 11/21/2014  . BMI 40.0-44.9, adult (HCC) 11/21/2014  . Epistaxis 09/24/2014    Past Surgical History:  Procedure Laterality Date  . ANTERIOR  CERVICAL DECOMP/DISCECTOMY FUSION  05/09/2008   C5-6, C6-7; with arthrodesis also  . GASTRIC BYPASS  2002  . KNEE ARTHROSCOPY  2004   rt and lt knee  . ROTATOR CUFF REPAIR W/ DISTAL CLAVICLE EXCISION  01/06/2006   right  . SHOULDER ARTHROSCOPY  2003   rt  . SHOULDER ARTHROSCOPY  2003   rt  . TONSILLECTOMY    . TOTAL KNEE ARTHROPLASTY  03/29/2004   left  . TOTAL KNEE ARTHROPLASTY  11/07/2003   right  . TOTAL KNEE REVISION  07/30/2010   right; with lateral release    OB History    Gravida Para Term Preterm AB Living   0 0 0 0 0 0   SAB TAB Ectopic Multiple Live Births   0 0 0 0         Home Medications    Prior to Admission medications   Medication Sig Start Date End Date Taking? Authorizing Provider  acetaminophen (TYLENOL) 500 MG tablet Take 1,000 mg by mouth every 6 (six) hours as needed for moderate pain or headache.    [provider]  acetaminophen-codeine (TYLENOL #3) 300-30 MG tablet Take 1 tablet by mouth every 8 (eight) hours as needed for moderate pain. Patient not taking: Reported on 04/12/2017 11/17/16   Sherren Mocha, MD  acyclovir ointment (ZOVIRAX) 5 % Apply topically every 3 (three) hours. Patient not taking: Reported on 06/08/2017 06/25/16  Arvilla Market, DO  albuterol (PROVENTIL HFA;VENTOLIN HFA) 108 (90 Base) MCG/ACT inhaler Inhale 2 puffs into the lungs every 4 (four) hours as needed for wheezing or shortness of breath (cough, shortness of breath or wheezing.). 06/15/17   Leatha Gilding, MD  amphetamine-dextroamphetamine (ADDERALL XR) 30 MG 24 hr capsule Take 30 mg by mouth daily.    [provider]  benzonatate (TESSALON) 200 MG capsule Take 1 capsule (200 mg total) by mouth 3 (three) times daily as needed for cough. Patient not taking: Reported on 04/12/2017 11/17/16   Sherren Mocha, MD  dextromethorphan-guaiFENesin [facility] DM) 30-600 MG 12hr tablet Take 1 tablet by mouth 2 (two) times daily. 07/27/17   Shannon Kirkendall, NP  DULoxetine  (CYMBALTA) 60 MG capsule Take 1 capsule (60 mg total) by mouth daily. 06/25/16   Arvilla Market, DO  EPINEPHrine 0.3 mg/0.3 mL IJ SOAJ injection Inject 0.3 mLs (0.3 mg total) into the muscle once. Patient taking differently: Inject 0.3 mg into the muscle daily as needed (allergic reaction).  05/07/15   Lorre Nick, MD  FLUoxetine (PROZAC) 20 MG tablet Take 20 mg by mouth daily.    [provider]  levofloxacin (LEVAQUIN) 500 MG tablet Take 1 tablet (500 mg total) by mouth daily. 07/27/17   Emaya Preston, NP  predniSONE (DELTASONE) 20 MG tablet Take 1 tablet (20 mg total) by mouth 2 (two) times daily. 07/27/17   Clevie Prout, NP  traZODone (DESYREL) 50 MG tablet TAKE 50 MG BY MOUTH DAILY AT BEDTIME AS NEEDED FOR SLEEP 12/11/14   [provider]    Family History Family History  Problem Relation Age of Onset  . Heart disease Mother   . Cancer Father   . Heart disease Maternal Grandfather   . Alcoholism Maternal Grandfather   . Heart attack Brother     Social History Social History  Substance Use Topics  . Smoking status: Former Smoker    Quit date: 05/16/1981  . Smokeless tobacco: Never Used  . Alcohol use 1.2 oz/week    2 Cans of beer per week     Comment: 6 cans per day/everyday     Allergies   Patient has no known allergies.   Review of Systems Review of Systems  Constitutional: Negative for chills and fever.  HENT: Negative.   Respiratory: Positive for cough and shortness of breath.   Cardiovascular: Negative for chest pain and palpitations.     Physical Exam Triage Vital Signs ED Triage Vitals  Enc Vitals Group     BP 07/27/17 2030 (!) 128/49     Pulse Rate 07/27/17 2030 88     Resp 07/27/17 2030 20     Temp 07/27/17 2030 98 F (36.7 C)     Temp Source 07/27/17 2030 Oral     SpO2 07/27/17 2030 95 %     Weight 07/27/17 2031 270 lb (122.5 kg)     Height 07/27/17 2031  (1.702 m)     Head Circumference --      Peak Flow  --      Pain Score --      Pain Loc --      Pain Edu? --      Excl. in GC? --    No data found.   Updated Vital Signs BP (!) 128/49 (BP Location: Left Arm)   Pulse 88   Temp 98 F (36.7 C) (Oral)   Resp 20   Ht  (1.702  m)   Wt 270 lb (122.5 kg)   SpO2 95%   BMI 42.29 kg/m   Visual Acuity Right Eye Distance:   Left Eye Distance:   Bilateral Distance:    Right Eye Near:   Left Eye Near:    Bilateral Near:     Physical Exam  Constitutional: She appears well-developed and well-nourished. No distress.  HENT:  Head: Normocephalic.  Eyes: Pupils are equal, round, and reactive to light.  Neck: Normal range of motion.  Cardiovascular: Normal rate, regular rhythm and normal heart sounds.   Pulmonary/Chest: No respiratory distress. She has wheezes (Diffuse wheezing.). She has no rales.  Skin: She is not diaphoretic.     UC Treatments / Results  Labs (all labs ordered are listed, but only abnormal results are displayed) Labs Reviewed - No data to display  EKG  EKG Interpretation None       Radiology Dg Chest 2 View  Result Date: 07/27/2017 CLINICAL DATA:  Cough EXAM: CHEST  2 VIEW COMPARISON:  06/14/2017, 06/11/2017, 11/16/2016 FINDINGS: Surgical hardware in the cervical spine. Slight worsening of ill-defined infiltrate in the lingula and left lower lobe. Stable cardiomediastinal silhouette. No pneumothorax. IMPRESSION: Ill-defined pulmonary infiltrate within the lingula and left lower lobe. Radiographic follow-up to resolution is recommended. Electronically Signed   By: Jasmine Pang M.D.   On: 07/27/2017 21:51    Procedures Procedures (including critical care time)  Medications Ordered in UC Medications  ipratropium-albuterol (DUONEB) 0.5-2.5 (3) MG/3ML nebulizer solution 3 mL (3 mLs Nebulization Given 07/27/17 2126)     Initial Impression / Assessment and Plan / UC Course  I have reviewed the triage vital signs and the nursing notes.  Pertinent labs  & imaging results that were available during my care of the patient were reviewed by me and considered in my medical decision making (see chart for details).   Breathing improved post Neb Tx.  CXR: Ill-defined pulmonary infiltrate within the lingula and left lower lobe. Radiographic follow-up to resolution is recommended.   Final Clinical Impressions(s) / UC Diagnoses   Final diagnoses:  Pneumonia of left lower lobe due to infectious organism (HCC)  Wheezing  Cough    New Prescriptions New Prescriptions   DEXTROMETHORPHAN-GUAIFENESIN (MUCINEX DM) 30-600 MG 12HR TABLET    Take 1 tablet by mouth 2 (two) times daily.   LEVOFLOXACIN (LEVAQUIN) 500 MG TABLET    Take 1 tablet (500 mg total) by mouth daily.   PREDNISONE (DELTASONE) 20 MG TABLET    Take 1 tablet (20 mg total) by mouth 2 (two) times daily.     Controlled Substance Prescriptions [place] Controlled Substance Registry consulted? Not Applicable   Reinaldo Raddle, NP 07/27/17 2202

## 2017-07-27 NOTE — Discharge Instructions (Signed)
Continue Albuterol INH as advised. FU with PCP x 2 days. Repeat CXR x 4 weeks to confirm complete resolution of Pneumonia

## 2017-08-09 ENCOUNTER — Telehealth: Payer: Self-pay

## 2017-08-09 NOTE — Telephone Encounter (Signed)
LMVM for patient to CB to review overdue health maintenance. 

## 2017-09-08 ENCOUNTER — Telehealth: Payer: Self-pay

## 2017-09-08 NOTE — Telephone Encounter (Signed)
Copied from CRM [id number]. Topic: Referral - Question >> Sep 05, 2017 10:45 AM Waymon AmatoBurton, [name] wrote: Reason for CRM: pt's pain clinic has gone out of business before she could be scheduled and would like a different referral office  Best number [phone]  >> Sep 05, 2017 12:27 PM Pollyann KennedyHinson, [name] wrote: Sent to referral pool >> Sep 08, 2017 10:23 AM Louie BunPalacios Medina, [name] wrote: Patient called back today to check the status of her new referral. Please call patient back, thanks.

## 2017-09-11 ENCOUNTER — Telehealth: Payer: Self-pay | Admitting: Family Medicine

## 2017-09-11 DIAGNOSIS — L4050 Arthropathic psoriasis, unspecified: Secondary | ICD-10-CM

## 2017-09-11 DIAGNOSIS — G894 Chronic pain syndrome: Secondary | ICD-10-CM

## 2017-09-11 NOTE — Telephone Encounter (Signed)
Pt called requesting pain management referral be changed to another facility due to facility we referred her to closing. I asked her if she was okay with [facility] and she stated that was okay. I checked in referrals and the pt's last pain management referral was April of 2017. I let the pt know we would need a new referral placed in order to send. Please place if appropriate. Pt also wanted to know if she could be prescribed pain medicine until she is able to get this referral. She is having pain due to arthritis and is having trouble sleeping.

## 2017-09-11 NOTE — Telephone Encounter (Signed)
Spoke with pt. Let her know we would need new referral placed and if placed, we would send to [facility] and they would call her.

## 2017-09-11 NOTE — Telephone Encounter (Signed)
Referral placed. Cannot bridge as we have not received any of the records of her pain clinic. Would CONSIDER it if we had her records from her prior pain clinic AND she had an appointment sched w/ a new clinic AND she would need to be seen here in an office visit to review all of this.

## 2017-09-12 NOTE — Telephone Encounter (Signed)
Unable to reach patient mailbox is full.  Please advise patient that referral was placed, but if she needs pain medication she will need to be seen at clinic.

## 2017-09-20 ENCOUNTER — Ambulatory Visit (INDEPENDENT_AMBULATORY_CARE_PROVIDER_SITE_OTHER): Payer: BC Managed Care – PPO | Admitting: *Deleted

## 2017-09-20 DIAGNOSIS — Z23 Encounter for immunization: Secondary | ICD-10-CM

## 2017-10-04 NOTE — Telephone Encounter (Signed)
Kathie RhodesBetty from [facility] called requesting office notes pertaining to referral sent on 09/26/17. I let her know we did not have any recent notes but that the pt had previously been referred last year for pain management. She stated they would need the pt to come back in our office to be seen so they can have recent documentation pertaining to the referral as well as any updated imaging if that pertains to pt's pain. I left the pt a vm on her home number and was unable to on her cell to call us back. Pt will need to make appt before [facility] can proceed with referral.

## 2017-10-14 ENCOUNTER — Ambulatory Visit: Payer: BC Managed Care – PPO | Admitting: Family Medicine

## 2017-11-30 ENCOUNTER — Telehealth: Payer: Self-pay | Admitting: Family Medicine

## 2017-11-30 NOTE — Telephone Encounter (Signed)
[facility]

## 2017-11-30 NOTE — Telephone Encounter (Signed)
Copied from CRM [id number]. Topic: Referral - Request >> Nov 30, 2017  8:32 AM [name], [name], NT wrote: Reason for CRM: Patient called and said the doctor referred her to a pain management specialist and they are going out of business. Patient wanted to see if she get a referral [facility].

## 2018-01-25 NOTE — Telephone Encounter (Signed)
patient has been accepted to [facility], they have reached out to patient, with no return call

## 2018-02-06 ENCOUNTER — Ambulatory Visit: Payer: Self-pay | Admitting: *Deleted

## 2018-02-06 NOTE — Telephone Encounter (Signed)
Pt reports "severe" nosebleed Sunday night, duration 20 minutes ; states used "2 towels, saturated. 2 clots noted." Last episode was 0330 this am, duration 10 minutes, "Not as severe, no clots present." Bleeding occurs from left nare only. Denies dizziness, reports mild headache but states h/o seasonal allergies. Pt reports similar episode in 2015 with anemia. Pt unable to made appt today; scheduled with B. Wiseman for tomorrow. Care advise given per protocol. Reason for Disposition . [1] Large amount of blood has been lost (e.g., 1 cup or 240 ml) AND [2] bleeding now controlled (stopped)  Answer Assessment - Initial Assessment Questions 1. AMOUNT OF BLEEDING: "How bad is the bleeding?" "How much blood was lost?" "Has the bleeding stopped?"   - MILD: needed a couple tissues   - MODERATE: needed many tissues   - SEVERE: large blood clots, soaked many tissues, lasted more than 30 minutes      Severe Sunday night with 2 clots; last night also severe, no clots 2. ONSET: "When did the nosebleed start?"      Sunday night 3. FREQUENCY: "How many nosebleeds have you had in the last 24 hours?"      1 4. RECURRENT SYMPTOMS: "Have there been other recent nosebleeds?" If so, ask: "How long did it take you to stop the bleeding?" "What worked best?"      20 15 5. CAUSE: "What do you think caused this nosebleed?"     Not sure 6. LOCAL FACTORS: "Do you have any cold symptoms?", "Have you been rubbing or picking at your nose?"     no 7. SYSTEMIC FACTORS: "Do you have high blood pressure or any bleeding problems?"     no 8. BLOOD THINNERS: "Do you take any blood thinners?" (e.g., coumadin, heparin, aspirin, Plavix)     no 9. OTHER SYMPTOMS: "Do you have any other symptoms?" (e.g., lightheadedness)     Sinus headache, pollen allergies. 10. PREGNANCY: "Is there any chance you are pregnant?" "When was your last menstrual period?"       no  Protocols used: NOSEBLEED-A-AH

## 2018-02-07 ENCOUNTER — Ambulatory Visit: Payer: Self-pay | Admitting: Physician Assistant

## 2018-05-23 IMAGING — DX DG CHEST 2V
2 series · 2 of 2 positions shown · non-contrast
Comparison: 06/14/2017, 06/11/2017, 11/16/2016

CLINICAL DATA: Cough

EXAM:
CHEST  2 VIEW

[chest pa]
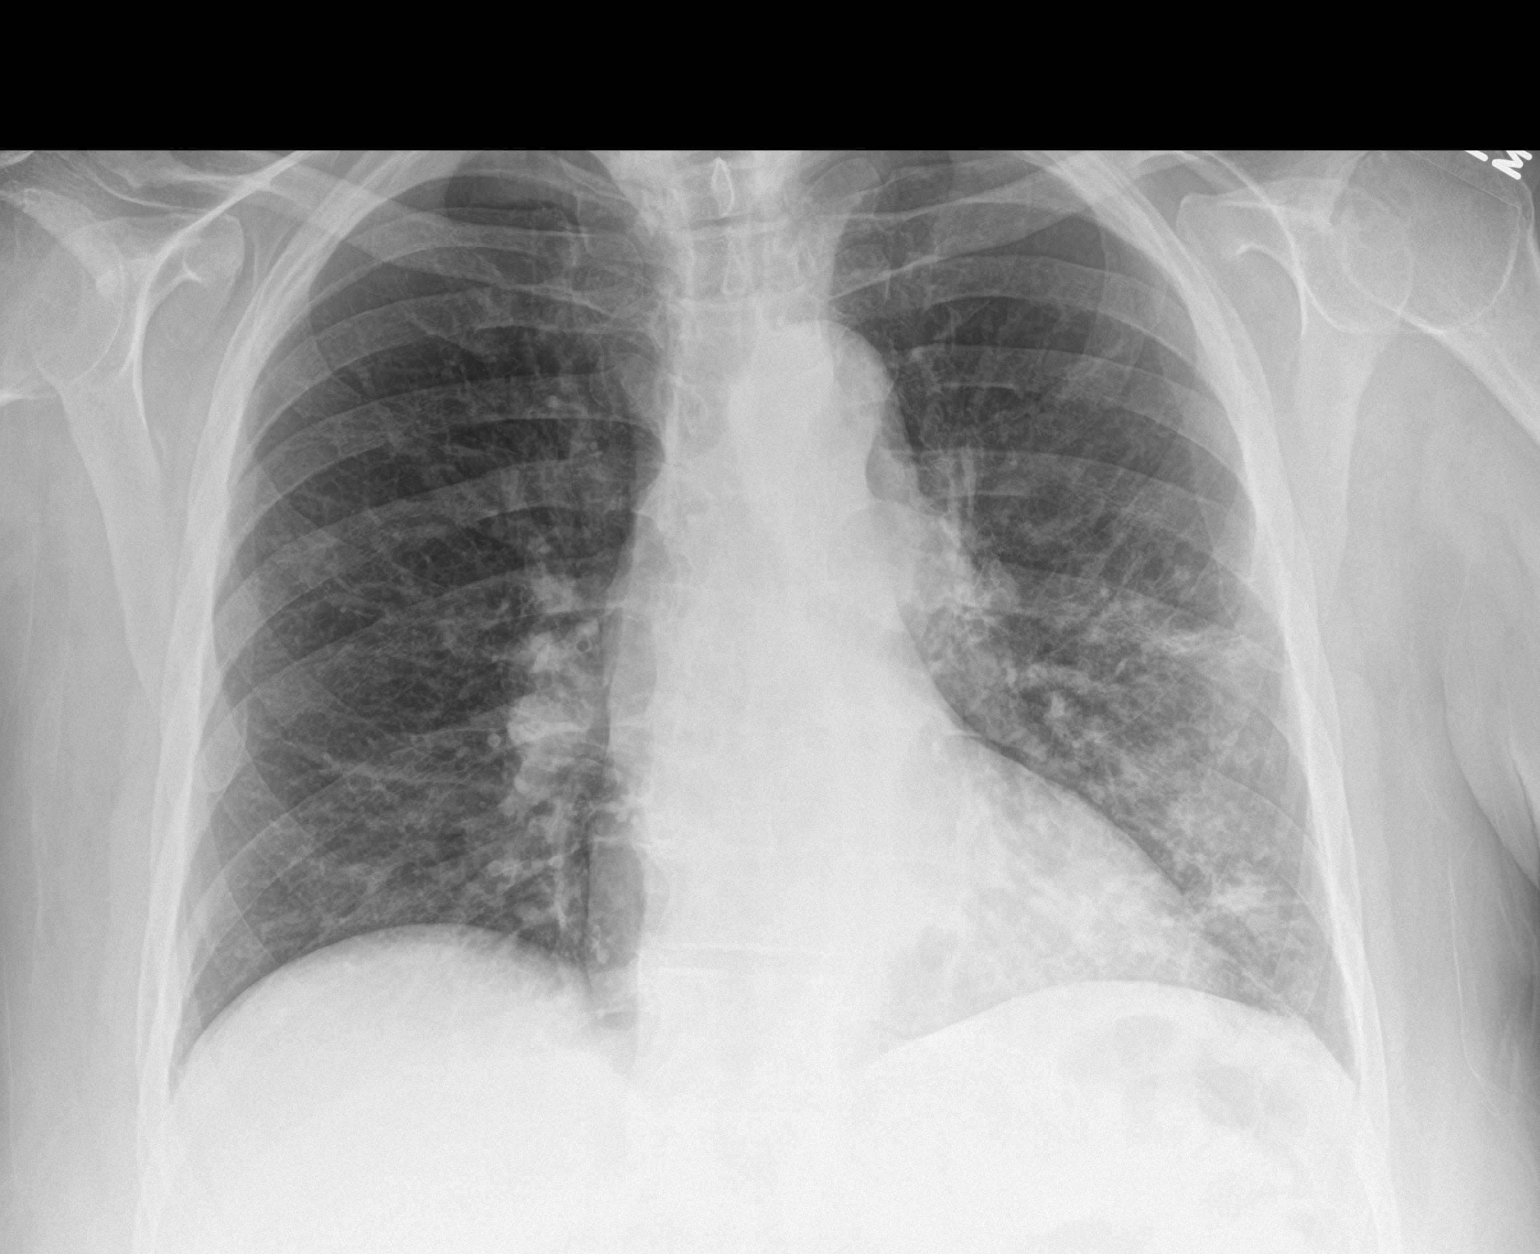

[chest lat]
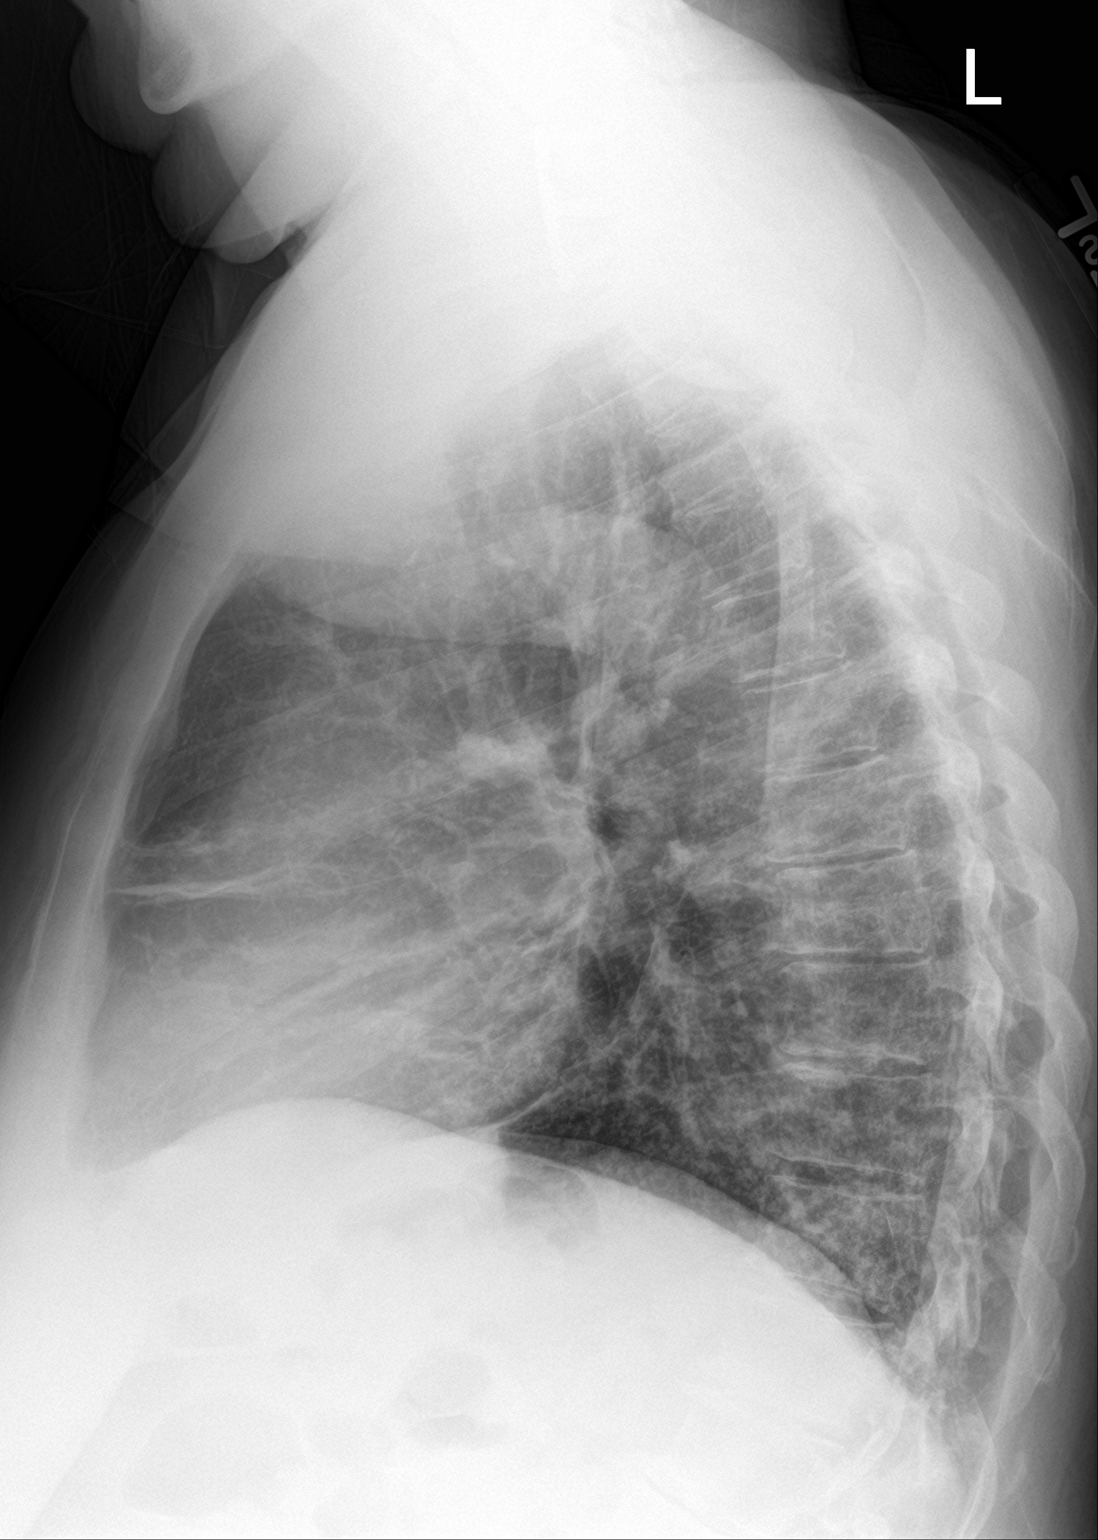

[2 of 2 positions shown; findings below may reference images not displayed]

FINDINGS: Surgical hardware in the cervical spine. Slight worsening of
ill-defined infiltrate in the lingula and left lower lobe. Stable
cardiomediastinal silhouette. No pneumothorax.
IMPRESSION: Ill-defined pulmonary infiltrate within the lingula and left lower
lobe. Radiographic follow-up to resolution is recommended.

## 2018-06-14 ENCOUNTER — Other Ambulatory Visit (HOSPITAL_BASED_OUTPATIENT_CLINIC_OR_DEPARTMENT_OTHER): Payer: Self-pay

## 2018-06-14 DIAGNOSIS — R454 Irritability and anger: Secondary | ICD-10-CM

## 2018-06-14 DIAGNOSIS — R5383 Other fatigue: Secondary | ICD-10-CM

## 2018-06-14 DIAGNOSIS — R0683 Snoring: Secondary | ICD-10-CM

## 2018-07-14 ENCOUNTER — Ambulatory Visit (HOSPITAL_BASED_OUTPATIENT_CLINIC_OR_DEPARTMENT_OTHER): Payer: BC Managed Care – PPO | Attending: Anesthesiology | Admitting: Internal Medicine

## 2018-07-14 VITALS — Ht 68.0 in | Wt 280.0 lb

## 2018-07-14 DIAGNOSIS — Z79899 Other long term (current) drug therapy: Secondary | ICD-10-CM | POA: Diagnosis not present

## 2018-07-14 DIAGNOSIS — R454 Irritability and anger: Secondary | ICD-10-CM | POA: Diagnosis not present

## 2018-07-14 DIAGNOSIS — E669 Obesity, unspecified: Secondary | ICD-10-CM | POA: Diagnosis not present

## 2018-07-14 DIAGNOSIS — Z6841 Body Mass Index (BMI) 40.0 and over, adult: Secondary | ICD-10-CM | POA: Insufficient documentation

## 2018-07-14 DIAGNOSIS — R0683 Snoring: Secondary | ICD-10-CM | POA: Insufficient documentation

## 2018-07-14 DIAGNOSIS — R5383 Other fatigue: Secondary | ICD-10-CM | POA: Diagnosis not present

## 2018-07-22 DIAGNOSIS — R0683 Snoring: Secondary | ICD-10-CM

## 2018-07-22 NOTE — Procedures (Signed)
  Patient Name: [name], [name] Date: 07/14/2018 Gender: Female D.O.B: 1956-02-21 Age (years): 62 Referring Provider: Thyra Breed Height (inches): 68 Interpreting Physician: Jetty Duhamel MD, ABSM Weight (lbs): 280 RPSGT: [place], [place] BMI: 43 MRN: [id number] Neck Size: 16.00  CLINICAL INFORMATION Sleep Study Type: NPSG Indication for sleep study: Fatigue, Obesity, OSA, Snoring Epworth Sleepiness Score: 14  SLEEP STUDY TECHNIQUE As per the AASM Manual for the Scoring of Sleep and Associated Events v2.3 (April 2016) with a hypopnea requiring 4% desaturations.  The channels recorded and monitored were frontal, central and occipital EEG, electrooculogram (EOG), submentalis EMG (chin), nasal and oral airflow, thoracic and abdominal wall motion, anterior tibialis EMG, snore microphone, electrocardiogram, and pulse oximetry.  MEDICATIONS Medications self-administered by patient taken the night of the study : TRAZODONE, Mucinex  SLEEP ARCHITECTURE The study was initiated at 10:16:53 PM and ended at 5:00:52 AM.  Sleep onset time was 8.1 minutes and the sleep efficiency was 73.2%%. The total sleep time was 295.8 minutes.  Stage REM latency was N/A minutes.  The patient spent 14.7%% of the night in stage N1 sleep, 84.6%% in stage N2 sleep, 0.7%% in stage N3 and 0% in REM.  Alpha intrusion was absent.  Supine sleep was 41.58%.  RESPIRATORY PARAMETERS The overall apnea/hypopnea index (AHI) was 2.4 per hour. There were 10 total apneas, including 7 obstructive, 3 central and 0 mixed apneas. There were 2 hypopneas and 24 RERAs.  The AHI during Stage REM sleep was N/A per hour.  AHI while supine was 3.9 per hour.  The mean oxygen saturation was 91.6%. The minimum SpO2 during sleep was 87.0%.  loud snoring was noted during this study.  CARDIAC DATA The 2 lead EKG demonstrated sinus rhythm. The mean heart rate was 73.3 beats per minute. Other EKG findings include: None.  LEG  MOVEMENT DATA The total PLMS were 0 with a resulting PLMS index of 0.0. Associated arousal with leg movement index was 0.0 .  IMPRESSIONS - No significant obstructive sleep apnea occurred during this study (AHI = 2.4/h). - No significant central sleep apnea occurred during this study (CAI = 0.6/h). - Mild oxygen desaturation was noted during this study (Min O2 = 87.0%). - The patient snored with loud snoring volume. - No cardiac abnormalities were noted during this study. - Clinically significant periodic limb movements did not occur during sleep. No significant associated arousals. - Patient left TV on all night and woke several times to adjust volume and change channels.- Concern for sleep hygiene.  DIAGNOSIS - Primary snoring  RECOMMENDATIONS - Consider ENT issues, body weight and sleep position possibly contributing to snoring if appropriate.  - Be careful with alcohol, sedatives and other CNS depressants that may worsen sleep apnea and disrupt normal sleep architecture. - Sleep hygiene should be reviewed to assess factors that may improve sleep quality. - Weight management and regular exercise should be initiated or continued if appropriate.  [Electronically signed] 07/22/2018 01:20 PM  Jetty Duhamel MD, ABSM Diplomate, American Board of Sleep Medicine   NPI: [id number]                           Jetty Duhamel Diplomate, American Board of Sleep Medicine  ELECTRONICALLY SIGNED ON:  07/22/2018, 1:13 PM [facility] PH: [phone]   FX: [phone] ACCREDITED BY THE AMERICAN ACADEMY OF SLEEP MEDICINE

## 2018-08-20 ENCOUNTER — Encounter: Payer: Self-pay | Admitting: Women's Health

## 2019-02-13 ENCOUNTER — Encounter: Payer: Self-pay | Admitting: Registered Nurse

## 2019-02-13 ENCOUNTER — Ambulatory Visit: Payer: Self-pay | Admitting: *Deleted

## 2019-02-13 ENCOUNTER — Telehealth (INDEPENDENT_AMBULATORY_CARE_PROVIDER_SITE_OTHER): Payer: BC Managed Care – PPO | Admitting: Registered Nurse

## 2019-02-13 ENCOUNTER — Other Ambulatory Visit: Payer: Self-pay

## 2019-02-13 VITALS — BP 138/92 | HR 78 | Ht 66.0 in | Wt 300.0 lb

## 2019-02-13 DIAGNOSIS — Z6841 Body Mass Index (BMI) 40.0 and over, adult: Secondary | ICD-10-CM

## 2019-02-13 DIAGNOSIS — N179 Acute kidney failure, unspecified: Secondary | ICD-10-CM | POA: Diagnosis not present

## 2019-02-13 DIAGNOSIS — Z8639 Personal history of other endocrine, nutritional and metabolic disease: Secondary | ICD-10-CM

## 2019-02-13 DIAGNOSIS — F1010 Alcohol abuse, uncomplicated: Secondary | ICD-10-CM

## 2019-02-13 DIAGNOSIS — Z0000 Encounter for general adult medical examination without abnormal findings: Secondary | ICD-10-CM

## 2019-02-13 NOTE — Telephone Encounter (Signed)
Message from Fanny Bien sent at 02/13/2019 8:25 AM EDT   Summary: blood pressure    Pt called and stated that her blood pressure is 138/95 and would like to know what she should do. Please advise

## 2019-02-13 NOTE — Progress Notes (Signed)
Telemedicine Encounter- SOAP NOTE Established Patient  This telephone encounter was conducted with the patient's (or proxy's) verbal consent via audio telecommunications: yes  Patient was instructed to have this encounter in a suitably private space; and to only have persons present to whom they give permission to participate. In addition, patient identity was confirmed by use of name plus two identifiers (DOB and address).  I discussed the limitations, risks, security and privacy concerns of performing an evaluation and management service by telephone and the availability of in person appointments. I also discussed with the patient that there may be a patient responsible charge related to this service. The patient expressed understanding and agreed to proceed.  I spent a total of 40 minutes talking with the patient or their proxy.  CC: Visit to establish care with new PCP, myself, after previous PCP, Dr. Norberto Sorenson, left the practice.  Subjective   [name] is a 63 y.o. established patient. Telephone visit today for establishing care with a new provider after Dr. Clelia Croft left the practice.  Discussed today:  Pt has concerns regarding high blood pressure readings over the previous 2 months. These have been recorded at other providers. She has reported that her most recent reading was yesterday, where it was 138/92. She states that she was referred to as "a walking stroke" and wishes to address her blood pressure at this time. She reports no signs of end organ damage.   Pt expresses that over the previous six months, her EtOH consumption has increased to roughly 10 drinks each day about 6 days each week. She reports a recent time where she went without EtOH for 4 consecutive days without DT symptoms. She is aware that this is "more than I should be drinking" and is likely contributing to her recent weight gain.  Weight gain: Pt had bariatric surgery in 2000, lost "about 60lbs" from her high  weight of a reported 350lb. At this time, she reports that she has gained roughly 20 pounds in the previous months and is currently around 300lbs. She expresses desire to lose weight.  Need for lab work: This patient has not had routine lab work since August 2018. As she is establishing care, we discussed new laboratory work to establish a baseline for my care.  HPI   Patient Active Problem List   Diagnosis Date Noted  . Pneumonia 06/10/2017  . CAP (community acquired pneumonia) 06/09/2017  . AKI (acute kidney injury) (HCC) 06/09/2017  . 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 Insomnia   . Cellulitis and abscess of leg 06/19/2016  . 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 Cellulitis of left lower extremity 01/09/2016  . Psoriatic arthritis (HCC)   . Cough   . Wheezing   . Chronic anemia 12/05/2015  . Chronic venous insufficiency 12/04/2015  . Hoarding disorder 03/05/2015  . Alcohol abuse 11/21/2014  . Anxiety state 11/21/2014  . BMI 40.0-44.9, adult (HCC) 11/21/2014  . Epistaxis 09/24/2014  . Bruising 05/03/2014  . Hematemesis 03/07/2014  . Depressive disorder 12/09/2013  . GAD (generalized anxiety disorder) 09/17/2013  . Fibromyalgia 07/22/2013    Past Medical History:  Diagnosis Date  . Anemia   . Arthritis   . Depression   . Fibromyalgia   . MRSA (methicillin resistant Staphylococcus aureus)   . Obese   . Pneumonia   . Seasonal allergies   . Sleep apnea 2012   took test in [place]-told to use cpap-could not ude    Current Outpatient Medications  Medication Sig Dispense Refill  . acyclovir ointment (ZOVIRAX) 5 % Apply topically every 3 (three) hours. 5 g 0  . cyclobenzaprine (FLEXERIL) 10 MG tablet     . EPINEPHrine 0.3 mg/0.3 mL IJ SOAJ injection Inject 0.3 mLs (0.3 mg total) into the muscle once. (Patient taking differently: Inject 0.3 mg into the muscle daily as needed (allergic reaction). ) 1 Device 2  . HYDROcodone-acetaminophen (NORCO) 7.5-325 MG tablet       No current facility-administered medications for this visit.     No Known Allergies  Social History   Socioeconomic History  . Marital status: Married    Spouse name: Not on file  . Number of children: Not on file  . Years of education: Not on file  . Highest education level: Not on file  Occupational History  . Not on file  Social Needs  . Financial resource strain: Not on file  . Food insecurity:    Worry: Not on file    Inability: Not on file  . Transportation needs:    Medical: Not on file    Non-medical: Not on file  Tobacco Use  . Smoking status: Former Smoker    Last attempt to quit: 05/16/1981    Years since quitting: 37.7  . Smokeless tobacco: Never Used  Substance and Sexual Activity  . Alcohol use: Yes    Alcohol/week: 60.0 standard drinks    Types: 60 Cans of beer per week    Comment: 10 beers / day on average, about 6 days each week  . Drug use: No  . Sexual activity: Yes    Birth control/protection: None, Post-menopausal    Comment: INTERCOURSE AGE 25, SEXUAL PARTNERS MORE THAN 5  Lifestyle  . Physical activity:    Days per week: Not on file    Minutes per session: Not on file  . Stress: Not on file  Relationships  . Social connections:    Talks on phone: Not on file    Gets together: Not on file    Attends religious service: Not on file    Active member of club or organization: Not on file    Attends meetings of clubs or organizations: Not on file    Relationship status: Not on file  . Intimate partner violence:    Fear of current or ex partner: Not on file    Emotionally abused: Not on file    Physically abused: Not on file    Forced sexual activity: Not on file  Other Topics Concern  . Not on file  Social History Narrative  . Not on file    Review of Systems  Constitutional: Negative for chills, fever, malaise/fatigue and weight loss.  HENT: Negative for hearing loss.   Eyes: Negative for blurred vision, photophobia and pain.   Respiratory: Negative for cough and wheezing.   Cardiovascular: Negative for chest pain and palpitations.  Gastrointestinal: Negative for diarrhea, nausea and vomiting.    Objective   Vitals as reported by the patient: Today's Vitals   02/13/19 1140  BP: (!) 138/92  Pulse: 78  Weight: 300 lb (136.1 kg)  Height: 5\' 6"  (1.676 m)    Diagnoses and all orders for this visit:  Encounter for medical examination to establish care  History of elevated glucose -     Hemoglobin A1c; Future  Acute kidney injury (HCC) -     Cancel: CBC with Differential -     Cancel: Basic Metabolic Panel -  Basic Metabolic Panel; Future  Alcohol abuse -     Cancel: CBC with Differential -     CBC with Differential; Future  Class 3 severe obesity due to excess calories without serious comorbidity with body mass index (BMI) of 45.0 to 49.9 in adult [facility]) -     Cancel: Lipid Panel -     Lipid Panel; Future   PLAN At this time, we discussed having the patient present to the clinic for lab draws and establishment of vital signs. We will delay starting an antihypertensive agent until we have a second consecutive hypertensive blood pressure.   Labs ordered: a1c, lipid panel, metabolic panel, cbc. Pt will be contacted to schedule labs, ideally within 1 week of this appt.  HTN: delay start of hypertensive agent until an in-office reading can confirm HTN.   Alcohol cessation: Pt reports that she is established with a therapist. Pt is aware that she is consuming an excess of alcohol, acknowledges that she wishes to limit use. We discussed increasing physical activity, breaking drinking routines, and replacing alcoholic beverages with NA options, such as seltzer.  Pt will contact clinic with any further questions, comments, or concerns.   I discussed the assessment and treatment plan with the patient. The patient was provided an opportunity to ask questions and all were answered. The patient agreed with  the plan and demonstrated an understanding of the instructions.   The patient was advised to call back or seek an in-person evaluation if the symptoms worsen or if the condition fails to improve as anticipated.  I provided 40 minutes of non-face-to-face time during this encounter.  Janeece Agee, NP  [facility] at [facility]

## 2019-02-13 NOTE — Telephone Encounter (Signed)
Contacted pt regarding her symptoms; she says that on 02/12/2019 her BP was 138/95; she says that her BP normally runs 130/80s; the pt also says that her BP has been running high for the last 6 months per her ain management MD; the pt says that she has gained 50 lbs in the past year; the pt also says she has been battling depression, and has been drinking a lot; informed the pt that Dr Clelia Croft is no longer with the practice; the pt says that she would like to transfer care to another provider; conference call intitiated with Corrie Dandy; per Corrie Dandy, pt offered and accepted virtual visit with Dr Bennie Hind at 1100; she verbalized understanding; the pt confirmed her cell phone number is [phone]; will route to office for notficaton  Reason for Disposition . [1] Systolic BP  >= 130 OR Diastolic >= 80 AND [2] not taking BP medications  Answer Assessment - Initial Assessment Questions 1. BLOOD PRESSURE: "What is the blood pressure?" "Did you take at least two measurements 5 minutes apart?"     138/95 on 02/12/2019 2. ONSET: "When did you take your blood pressure?"     At pain management MD's office 3. HOW: "How did you obtain the blood pressure?" (e.g., visiting nurse, automatic home BP monitor)   Home digital cuff left lower arm 4. HISTORY: "Do you have a history of high blood pressure?"    no 5. MEDICATIONS: "Are you taking any medications for blood pressure?" "Have you missed any doses recently?"    n/a 6. OTHER SYMPTOMS: "Do you have any symptoms?" (e.g., headache, chest pain, blurred vision, difficulty breathing, weakness)     no 7. PREGNANCY: "Is there any chance you are pregnant?" "When was your last menstrual period?"     No no  Protocols used: HIGH BLOOD PRESSURE-A-AH

## 2019-02-13 NOTE — Patient Instructions (Signed)
Preventing Hypertension Hypertension, commonly called high blood pressure, is when the force of blood pumping through the arteries is too strong. Arteries are blood vessels that carry blood from the heart throughout the body. Over time, hypertension can damage the arteries and decrease blood flow to important parts of the body, including the brain, heart, and kidneys. Often, hypertension does not cause symptoms until blood pressure is very high. For this reason, it is important to have your blood pressure checked on a regular basis. Hypertension can often be prevented with diet and lifestyle changes. If you already have hypertension, you can control it with diet and lifestyle changes, as well as medicine. What nutrition changes can be made? Maintain a healthy diet. This includes:  Eating less salt (sodium). Ask your health care provider how much sodium is safe for you to have. The general recommendation is to consume less than 1 tsp (2,300 mg) of sodium a day. ? Do not add salt to your food. ? Choose low-sodium options when grocery shopping and eating out.  Limiting fats in your diet. You can do this by eating low-fat or fat-free dairy products and by eating less red meat.  Eating more fruits, vegetables, and whole grains. Make a goal to eat: ? 1-2 cups of fresh fruits and vegetables each day. ? 3-4 servings of whole grains each day.  Avoiding foods and beverages that have added sugars.  Eating fish that contain healthy fats (omega-3 fatty acids), such as mackerel or salmon. If you need help putting together a healthy eating plan, try the DASH diet. This diet is high in fruits, vegetables, and whole grains. It is low in sodium, red meat, and added sugars. DASH stands for Dietary Approaches to Stop Hypertension. What lifestyle changes can be made?   Lose weight if you are overweight. Losing just 3?5% of your body weight can help prevent or control hypertension. ? For example, if your present  weight is 200 lb (91 kg), a loss of 3-5% of your weight means losing 6-10 lb (2.7-4.5 kg). ? Ask your health care provider to help you with a diet and exercise plan to safely lose weight.  Get enough exercise. Do at least 150 minutes of moderate-intensity exercise each week. ? You could do this in short exercise sessions several times a day, or you could do longer exercise sessions a few times a week. For example, you could take a brisk 10-minute walk or bike ride, 3 times a day, for 5 days a week.  Find ways to reduce stress, such as exercising, meditating, listening to music, or taking a yoga class. If you need help reducing stress, ask your health care provider.  Do not smoke. This includes e-cigarettes. Chemicals in tobacco and nicotine products raise your blood pressure each time you smoke. If you need help quitting, ask your health care provider.  Avoid alcohol. If you drink alcohol, limit alcohol intake to no more than 1 drink a day for nonpregnant women and 2 drinks a day for men. One drink equals 12 oz of beer, 5 oz of wine, or 1 oz of hard liquor. Why are these changes important? Diet and lifestyle changes can help you prevent hypertension, and they may make you feel better overall and improve your quality of life. If you have hypertension, making these changes will help you control it and help prevent major complications, such as:  Hardening and narrowing of arteries that supply blood to: ? Your heart. This can cause a heart  attack. ? Your brain. This can cause a stroke. ? Your kidneys. This can cause kidney failure.  Stress on your heart muscle, which can cause heart failure. What can I do to lower my risk?  Work with your health care provider to make a hypertension prevention plan that works for you. Follow your plan and keep all follow-up visits as told by your health care provider.  Learn how to check your blood pressure at home. Make sure that you know your personal target  blood pressure, as told by your health care provider. How is this treated? In addition to diet and lifestyle changes, your health care provider may recommend medicines to help lower your blood pressure. You may need to try a few different medicines to find what works best for you. You also may need to take more than one medicine. Take over-the-counter and prescription medicines only as told by your health care provider. Where to find support Your health care provider can help you prevent hypertension and help you keep your blood pressure at a healthy level. Your local hospital or your community may also provide support services and prevention programs. The American Heart Association offers an online support network at: CheapBootlegs.com.cy Where to find more information Learn more about hypertension from:  [place], Lung, and Blood Institute: ElectronicHangman.is  Centers for Disease Control and Prevention: https://ingram.com/  American Academy of Family Physicians: http://familydoctor.org/familydoctor/en/diseases-conditions/high-blood-pressure.printerview.all.html Learn more about the DASH diet from:  [place], Lung, and [place]: https://www.reyes.com/ Contact a health care provider if:  You think you are having a reaction to medicines you have taken.  You have recurrent headaches or feel dizzy.  You have swelling in your ankles.  You have trouble with your vision. Summary  Hypertension often does not cause any symptoms until blood pressure is very high. It is important to get your blood pressure checked regularly.  Diet and lifestyle changes are the most important steps in preventing hypertension.  By keeping your blood pressure in a healthy range, you can prevent complications like heart attack, heart failure, stroke, and kidney failure.  Work with your health care  provider to make a hypertension prevention plan that works for you. This information is not intended to replace advice given to you by your health care provider. Make sure you discuss any questions you have with your health care provider. Document Released: 10/25/2015 Document Revised: 06/20/2016 Document Reviewed: 06/20/2016 Elsevier Interactive Patient Education  2019 Else[employer] Inc.  Preventing Hypertension Hypertension, commonly called high blood pressure, is when the force of blood pumping through the arteries is too strong. Arteries are blood vessels that carry blood from the heart throughout the body. Over time, hypertension can damage the arteries and decrease blood flow to important parts of the body, including the brain, heart, and kidneys. Often, hypertension does not cause symptoms until blood pressure is very high. For this reason, it is important to have your blood pressure checked on a regular basis. Hypertension can often be prevented with diet and lifestyle changes. If you already have hypertension, you can control it with diet and lifestyle changes, as well as medicine. What nutrition changes can be made? Maintain a healthy diet. This includes:  Eating less salt (sodium). Ask your health care provider how much sodium is safe for you to have. The general recommendation is to consume less than 1 tsp (2,300 mg) of sodium a day. ? Do not add salt to your food. ? Choose low-sodium options when grocery shopping and eating  out.  Limiting fats in your diet. You can do this by eating low-fat or fat-free dairy products and by eating less red meat.  Eating more fruits, vegetables, and whole grains. Make a goal to eat: ? 1-2 cups of fresh fruits and vegetables each day. ? 3-4 servings of whole grains each day.  Avoiding foods and beverages that have added sugars.  Eating fish that contain healthy fats (omega-3 fatty acids), such as mackerel or salmon. If you need help putting together a  healthy eating plan, try the DASH diet. This diet is high in fruits, vegetables, and whole grains. It is low in sodium, red meat, and added sugars. DASH stands for Dietary Approaches to Stop Hypertension. What lifestyle changes can be made?   Lose weight if you are overweight. Losing just 3?5% of your body weight can help prevent or control hypertension. ? For example, if your present weight is 200 lb (91 kg), a loss of 3-5% of your weight means losing 6-10 lb (2.7-4.5 kg). ? Ask your health care provider to help you with a diet and exercise plan to safely lose weight.  Get enough exercise. Do at least 150 minutes of moderate-intensity exercise each week. ? You could do this in short exercise sessions several times a day, or you could do longer exercise sessions a few times a week. For example, you could take a brisk 10-minute walk or bike ride, 3 times a day, for 5 days a week.  Find ways to reduce stress, such as exercising, meditating, listening to music, or taking a yoga class. If you need help reducing stress, ask your health care provider.  Do not smoke. This includes e-cigarettes. Chemicals in tobacco and nicotine products raise your blood pressure each time you smoke. If you need help quitting, ask your health care provider.  Avoid alcohol. If you drink alcohol, limit alcohol intake to no more than 1 drink a day for nonpregnant women and 2 drinks a day for men. One drink equals 12 oz of beer, 5 oz of wine, or 1 oz of hard liquor. Why are these changes important? Diet and lifestyle changes can help you prevent hypertension, and they may make you feel better overall and improve your quality of life. If you have hypertension, making these changes will help you control it and help prevent major complications, such as:  Hardening and narrowing of arteries that supply blood to: ? Your heart. This can cause a heart attack. ? Your brain. This can cause a stroke. ? Your kidneys. This can cause  kidney failure.  Stress on your heart muscle, which can cause heart failure. What can I do to lower my risk?  Work with your health care provider to make a hypertension prevention plan that works for you. Follow your plan and keep all follow-up visits as told by your health care provider.  Learn how to check your blood pressure at home. Make sure that you know your personal target blood pressure, as told by your health care provider. How is this treated? In addition to diet and lifestyle changes, your health care provider may recommend medicines to help lower your blood pressure. You may need to try a few different medicines to find what works best for you. You also may need to take more than one medicine. Take over-the-counter and prescription medicines only as told by your health care provider. Where to find support Your health care provider can help you prevent hypertension and help you keep your  blood pressure at a healthy level. Your local hospital or your community may also provide support services and prevention programs. The American Heart Association offers an online support network at: CheapBootlegs.com.cy Where to find more information Learn more about hypertension from:  [place], Lung, and Blood Institute: ElectronicHangman.is  Centers for Disease Control and Prevention: https://ingram.com/  American Academy of Family Physicians: http://familydoctor.org/familydoctor/en/diseases-conditions/high-blood-pressure.printerview.all.html Learn more about the DASH diet from:  [place], Lung, and [place]: https://www.reyes.com/ Contact a health care provider if:  You think you are having a reaction to medicines you have taken.  You have recurrent headaches or feel dizzy.  You have swelling in your ankles.  You have trouble with your vision. Summary  Hypertension often  does not cause any symptoms until blood pressure is very high. It is important to get your blood pressure checked regularly.  Diet and lifestyle changes are the most important steps in preventing hypertension.  By keeping your blood pressure in a healthy range, you can prevent complications like heart attack, heart failure, stroke, and kidney failure.  Work with your health care provider to make a hypertension prevention plan that works for you. This information is not intended to replace advice given to you by your health care provider. Make sure you discuss any questions you have with your health care provider. Document Released: 10/25/2015 Document Revised: 06/20/2016 Document Reviewed: 06/20/2016 Elsevier Interactive Patient Education  2019 Elsevier Inc.  Preventing Hypertension Hypertension, commonly called high blood pressure, is when the force of blood pumping through the arteries is too strong. Arteries are blood vessels that carry blood from the heart throughout the body. Over time, hypertension can damage the arteries and decrease blood flow to important parts of the body, including the brain, heart, and kidneys. Often, hypertension does not cause symptoms until blood pressure is very high. For this reason, it is important to have your blood pressure checked on a regular basis. Hypertension can often be prevented with diet and lifestyle changes. If you already have hypertension, you can control it with diet and lifestyle changes, as well as medicine. What nutrition changes can be made? Maintain a healthy diet. This includes:  Eating less salt (sodium). Ask your health care provider how much sodium is safe for you to have. The general recommendation is to consume less than 1 tsp (2,300 mg) of sodium a day. ? Do not add salt to your food. ? Choose low-sodium options when grocery shopping and eating out.  Limiting fats in your diet. You can do this by eating low-fat or fat-free dairy  products and by eating less red meat.  Eating more fruits, vegetables, and whole grains. Make a goal to eat: ? 1-2 cups of fresh fruits and vegetables each day. ? 3-4 servings of whole grains each day.  Avoiding foods and beverages that have added sugars.  Eating fish that contain healthy fats (omega-3 fatty acids), such as mackerel or salmon. If you need help putting together a healthy eating plan, try the DASH diet. This diet is high in fruits, vegetables, and whole grains. It is low in sodium, red meat, and added sugars. DASH stands for Dietary Approaches to Stop Hypertension. What lifestyle changes can be made?   Lose weight if you are overweight. Losing just 3?5% of your body weight can help prevent or control hypertension. ? For example, if your present weight is 200 lb (91 kg), a loss of 3-5% of your weight means losing 6-10 lb (2.7-4.5 kg). ? Ask your  health care provider to help you with a diet and exercise plan to safely lose weight.  Get enough exercise. Do at least 150 minutes of moderate-intensity exercise each week. ? You could do this in short exercise sessions several times a day, or you could do longer exercise sessions a few times a week. For example, you could take a brisk 10-minute walk or bike ride, 3 times a day, for 5 days a week.  Find ways to reduce stress, such as exercising, meditating, listening to music, or taking a yoga class. If you need help reducing stress, ask your health care provider.  Do not smoke. This includes e-cigarettes. Chemicals in tobacco and nicotine products raise your blood pressure each time you smoke. If you need help quitting, ask your health care provider.  Avoid alcohol. If you drink alcohol, limit alcohol intake to no more than 1 drink a day for nonpregnant women and 2 drinks a day for men. One drink equals 12 oz of beer, 5 oz of wine, or 1 oz of hard liquor. Why are these changes important? Diet and lifestyle changes can help you  prevent hypertension, and they may make you feel better overall and improve your quality of life. If you have hypertension, making these changes will help you control it and help prevent major complications, such as:  Hardening and narrowing of arteries that supply blood to: ? Your heart. This can cause a heart attack. ? Your brain. This can cause a stroke. ? Your kidneys. This can cause kidney failure.  Stress on your heart muscle, which can cause heart failure. What can I do to lower my risk?  Work with your health care provider to make a hypertension prevention plan that works for you. Follow your plan and keep all follow-up visits as told by your health care provider.  Learn how to check your blood pressure at home. Make sure that you know your personal target blood pressure, as told by your health care provider. How is this treated? In addition to diet and lifestyle changes, your health care provider may recommend medicines to help lower your blood pressure. You may need to try a few different medicines to find what works best for you. You also may need to take more than one medicine. Take over-the-counter and prescription medicines only as told by your health care provider. Where to find support Your health care provider can help you prevent hypertension and help you keep your blood pressure at a healthy level. Your local hospital or your community may also provide support services and prevention programs. The American Heart Association offers an online support network at: CheapBootlegs.com.cy Where to find more information Learn more about hypertension from:  [place], Lung, and Blood Institute: ElectronicHangman.is  Centers for Disease Control and Prevention: https://ingram.com/  American Academy of Family Physicians:  http://familydoctor.org/familydoctor/en/diseases-conditions/high-blood-pressure.printerview.all.html Learn more about the DASH diet from:  [place], Lung, and [place]: https://www.reyes.com/ Contact a health care provider if:  You think you are having a reaction to medicines you have taken.  You have recurrent headaches or feel dizzy.  You have swelling in your ankles.  You have trouble with your vision. Summary  Hypertension often does not cause any symptoms until blood pressure is very high. It is important to get your blood pressure checked regularly.  Diet and lifestyle changes are the most important steps in preventing hypertension.  By keeping your blood pressure in a healthy range, you can prevent complications like heart attack, heart  failure, stroke, and kidney failure.  Work with your health care provider to make a hypertension prevention plan that works for you. This information is not intended to replace advice given to you by your health care provider. Make sure you discuss any questions you have with your health care provider. Document Released: 10/25/2015 Document Revised: 06/20/2016 Document Reviewed: 06/20/2016 Elsevier Interactive Patient Education  2019 [employer].  Alcohol Abuse and Nutrition Alcohol abuse is any pattern of alcohol consumption that harms your health, relationships, or work. Alcohol abuse can cause poor nutrition (malnutrition or malnourishment) and a lack of nutrients (nutrient deficiencies), which can lead to more complications. Alcohol abuse brings malnutrition and nutrient deficiencies in two ways:  It causes your liver to work abnormally. This affects how your body divides (breaks down) and absorbs nutrients from food.  It causes you to eat poorly. Many people who abuse alcohol do not eat enough carbohydrates, protein, fat, vitamins, and minerals. Nutrients that are commonly lacking (deficient) in  people who abuse alcohol include:  Vitamins. ? Vitamin A. This is needed for your vision, metabolism, and ability to fight off infections (immunity). ? B vitamins. These include folate, thiamine, and niacin. These are needed for new cell growth. ? Vitamin C. This plays an important role in wound healing, immunity, and helping your body to absorb iron. ? Vitamin D. This is necessary for your body to absorb and use calcium. It is produced by your liver, but you can also get it from food and from sun exposure.  Minerals. ? Calcium. This is needed for healthy bones as well as heart and blood vessel (cardiovascular) function. ? Iron. This is important for blood, muscle, and nervous system functioning. ? Magnesium. This plays an important role in muscle and nerve function, and it helps to control blood sugar and blood pressure. ? Zinc. This is important for the normal functioning of your nervous system and digestive system (gastrointestinal tract). If you think that you have an alcohol dependency problem, or if it is hard to stop drinking because you feel sick or different when you do not use alcohol, talk with your health care provider or another health professional about where to get help. Nutrition is an essential factor in therapy for alcohol abuse. Your health care provider or diet and nutrition specialist (dietitian) will work with you to design a plan that can help to restore nutrients to your body and prevent the risk of complications. What is my plan? Your dietitian may develop a specific eating plan that is based on your condition and any other problems that you have. An eating plan will commonly include:  A balanced diet. ? Grains: 6-8 oz (170-227 g) a day. Examples of 1 oz of whole grains include 1 cup of whole-wheat cereal,  cup of brown rice, or 1 slice of whole-wheat bread. ? Vegetables: 2-3 cups a day. Examples of 1 cup of vegetables include 2 medium carrots, 1 large tomato, or 2  stalks of celery. ? Fruits: 1-2 cups a day. Examples of 1 cup of fruit include 1 large banana, 1 small apple, 8 large strawberries, or 1 large orange. ? Meat and other protein: 5-6 oz (142-170 g) a day.  A cut of meat or fish that is the size of a deck of cards is about 3-4 oz.  Foods that provide 1 oz of protein include 1 egg,  cup of nuts or seeds, or 1 tablespoon (16 g) of peanut butter. ? Dairy: 2-3 cups a  day. Examples of 1 cup of dairy include 8 oz (230 mL) of milk, 8 oz (230 g) of yogurt, or 1 oz (44 g) of natural cheese.  Vitamin and mineral supplements. What are tips for following this plan?  Eat frequent meals and snacks. Try to eat 5-6 small meals each day.  Take vitamin or mineral supplements as recommended by your dietitian.  If you are malnourished or if your dietitian recommends it: ? You may follow a high-protein, high-calorie diet. This may include:  2,000-3,000 calories (kilocalories) a day.  70-100 g (grams) of protein a day. ? You may be directed to follow a diet that includes a complete nutritional supplement beverage. This can help to restore calories, protein, and vitamins to your body. Depending on your condition, you may be advised to consume this beverage instead of your meals or in addition to them.  Certain medicines may cause changes in your appetite, taste, and weight. Work with your health care provider and dietitian to make any changes to your medicines and eating plan.  If you are unable to take in enough food and calories by mouth, your health care provider may recommend a feeding tube. This tube delivers nutritional supplements directly to your stomach. Recommended foods  Eat foods that are high in molecules that prevent oxygen from reacting with your food (antioxidants). These foods include grapes, berries, nuts, green tea, and dark green or orange vegetables. Eating these can help to prevent some of the stress that is placed on your liver by  consuming alcohol.  Eat a variety of fresh fruits and vegetables each day. This will help you to get fiber and vitamins in your diet.  Drink plenty of water and other clear fluids, such as apple juice and broth. Try to drink at least 48-64 oz (1.5-2 L) of water a day.  Include foods fortified with vitamins and minerals in your diet. Commonly fortified foods include milk, orange juice, cereal, and bread.  Eat a variety of foods that are high in omega-3 and omega-6 fatty acids. These include fish, nuts and seeds, and soybeans. These foods may help your liver to recover and may also stabilize your mood.  If you are a vegetarian: ? Eat a variety of protein-rich foods. ? Pair whole grains with plant-based proteins at meals and snack time. For example, eat rice with beans, put peanut butter on whole-grain toast, or eat oatmeal with sunflower seeds. The items listed above may not be a complete list of foods and beverages you can eat. Contact a dietitian for more information. Foods to avoid  Avoid foods and drinks that are high in fat and sugar. Sugary drinks, salty snacks, and candy contain empty calories. This means that they lack important nutrients such as protein, fiber, and vitamins.  Avoid alcohol. This is the best way to avoid malnutrition due to alcohol abuse. If you must drink, drink measured amounts. Measured drinking means limiting your intake to no more than 1 drink a day for nonpregnant women and 2 drinks a day for men. One drink equals 12 oz (355 mL) of beer, 5 oz (148 mL) of wine, or 1 oz (44 mL) of hard liquor.  Limit your intake of caffeine. Replace drinks like coffee and black tea with decaffeinated coffee and decaffeinated herbal tea. The items listed above may not be a complete list of foods and beverages you should avoid. Contact a dietitian for more information. Summary  Alcohol abuse can cause poor nutrition (malnutrition or malnourishment)  and a lack of nutrients (nutrient  deficiencies), which can lead to more health problems.  Common nutrient deficiencies include vitamin deficiencies (A, B, C, and D) and mineral deficiencies (calcium, iron, magnesium, and zinc).  Nutrition is an essential factor in therapy for alcohol abuse.  Your health care provider and dietitian can help you to develop a specific eating plan that includes a balanced diet plus vitamin and mineral supplements. This information is not intended to replace advice given to you by your health care provider. Make sure you discuss any questions you have with your health care provider. Document Released: 08/04/2005 Document Revised: 06/13/2018 Document Reviewed: 06/27/2017 Elsevier Interactive Patient Education  2019 [employer].  Alcohol Use Disorder Alcohol use disorder is when your drinking disrupts your daily life. When you have this condition, you drink too much alcohol and you cannot control your drinking. Alcohol use disorder can cause serious problems with your physical health. It can affect your brain, heart, liver, pancreas, immune system, stomach, and intestines. Alcohol use disorder can increase your risk for certain cancers and cause problems with your mental health, such as depression, anxiety, psychosis, delirium, and dementia. People with this disorder risk hurting themselves and others. What are the causes? This condition is caused by drinking too much alcohol over time. It is not caused by drinking too much alcohol only one or two times. Some people with this condition drink alcohol to cope with or escape from negative life events. Others drink to relieve pain or symptoms of mental illness. What increases the risk? You are more likely to develop this condition if:  You have a family history of alcohol use disorder.  Your culture encourages drinking to the point of intoxication, or makes alcohol easy to get.  You had a mood or conduct disorder in childhood.  You have been a victim  of abuse.  You are an adolescent and: ? You have poor grades or difficulties in school. ? Your caregivers do not talk to you about saying no to alcohol, or supervise your activities. ? You are impulsive or you have trouble with self-control. What are the signs or symptoms? Symptoms of this condition include:  Drinkingmore than you want to.  Drinking for longer than you want to.  Trying several times to drink less or to control your drinking.  Spending a lot of time getting alcohol, drinking, or recovering from drinking.  Craving alcohol.  Having problems at work, at school, or at home due to drinking.  Having problems in relationships due to drinking.  Drinking when it is dangerous to drink, such as before driving a car.  Continuing to drink even though you know you might have a physical or mental problem related to drinking.  Needing more and more alcohol to get the same effect you want from the alcohol (building up tolerance).  Having symptoms of withdrawal when you stop drinking. Symptoms of withdrawal include: ? Fatigue. ? Nightmares. ? Trouble sleeping. ? Depression. ? Anxiety. ? Fever. ? Seizures. ? Severe confusion. ? Feeling or seeing things that are not there (hallucinations). ? Tremors. ? Rapid heart rate. ? Rapid breathing. ? High blood pressure.  Drinking to avoid symptoms of withdrawal. How is this diagnosed? This condition is diagnosed with an assessment. Your health care provider may start the assessment by asking three or four questions about your drinking. Your health care provider may perform a physical exam or do lab tests to see if you have physical problems resulting from alcohol  use. She or he may refer you to a mental health professional for evaluation. How is this treated? Some people with alcohol use disorder are able to reduce their alcohol use to low-risk levels. Others need to completely quit drinking alcohol. When necessary, mental health  professionals with specialized training in substance use treatment can help. Your health care provider can help you decide how severe your alcohol use disorder is and what type of treatment you need. The following forms of treatment are available:  Detoxification. Detoxification involves quitting drinking and using prescription medicines within the first week to help lessen withdrawal symptoms. This treatment is important for people who have had withdrawal symptoms before and for heavy drinkers who are likely to have withdrawal symptoms. Alcohol withdrawal can be dangerous, and in severe cases, it can cause death. Detoxification may be provided in a home, community, or primary care setting, or in a hospital or substance use treatment facility.  Counseling. This treatment is also called talk therapy. It is provided by substance use treatment counselors. A counselor can address the reasons you use alcohol and suggest ways to keep you from drinking again or to prevent problem drinking. The goals of talk therapy are to: ? Find healthy activities and ways for you to cope with stress. ? Identify and avoid the things that trigger your alcohol use. ? Help you learn how to handle cravings.  Medicines.Medicines can help treat alcohol use disorder by: ? Decreasing alcohol cravings. ? Decreasing the positive feeling you have when you drink alcohol. ? Causing an uncomfortable physical reaction when you drink alcohol (aversion therapy).  Support groups. Support groups are led by people who have quit drinking. They provide emotional support, advice, and guidance. These forms of treatment are often combined. Some people with this condition benefit from a combination of treatments provided by specialized substance use treatment centers. Follow these instructions at home:  Take over-the-counter and prescription medicines only as told by your health care provider.  Check with your health care provider before  starting any new medicines.  Ask friends and family members not to offer you alcohol.  Avoid situations where alcohol is served, including gatherings where others are drinking alcohol.  Create a plan for what to do when you are tempted to use alcohol.  Find hobbies or activities that you enjoy that do not include alcohol.  Keep all follow-up visits as told by your health care provider. This is important. How is this prevented?  If you drink, limit alcohol intake to no more than 1 drink a day for nonpregnant women and 2 drinks a day for men. One drink equals 12 oz of beer, 5 oz of wine, or 1 oz of hard liquor.  If you have a mental health condition, get treatment and support.  Do not give alcohol to adolescents.  If you are an adolescent: ? Do not drink alcohol. ? Do not be afraid to say no if someone offers you alcohol. Speak up about why you do not want to drink. You can be a positive role model for your friends and set a good example for those around you by not drinking alcohol. ? If your friends drink, spend time with others who do not drink alcohol. Make new friends who do not use alcohol. ? Find healthy ways to manage stress and emotions, such as meditation or deep breathing, exercise, spending time in nature, listening to music, or talking with a trusted friend or family member. Contact a health care  provider if:  You are not able to take your medicines as told.  Your symptoms get worse.  You return to drinking alcohol (relapse) and your symptoms get worse. Get help right away if:  You have thoughts about hurting yourself or others. If you ever feel like you may hurt yourself or others, or have thoughts about taking your own life, get help right away. You can go to your nearest emergency department or call:  Your local emergency services (911 in the [place]  A suicide crisis helpline, such as the [place] at (202) 834-9149. This is open 24 hours  a day. Summary  Alcohol use disorder is when your drinking disrupts your daily life. When you have this condition, you drink too much alcohol and you cannot control your drinking.  Treatment may include detoxification, counseling, medicine, and support groups.  Ask friends and family members not to offer you alcohol. Avoid situations where alcohol is served.  Get help right away if you have thoughts about hurting yourself or others. This information is not intended to replace advice given to you by your health care provider. Make sure you discuss any questions you have with your health care provider. Document Released: 11/17/2004 Document Revised: 07/07/2016 Document Reviewed: 07/07/2016 Elsevier Interactive Patient Education  2019 [employer].  Exercising to [employer] Exercise is structured, repetitive physical activity to improve fitness and health. Getting regular exercise is important for everyone. It is especially important if you are overweight. Being overweight increases your risk of heart disease, stroke, diabetes, high blood pressure, and several types of cancer. Reducing your calorie intake and exercising can help you lose weight. Exercise is usually categorized as moderate or vigorous intensity. To lose weight, most people need to do a certain amount of moderate-intensity or vigorous-intensity exercise each week. Moderate-intensity exercise  Moderate-intensity exercise is any activity that gets you moving enough to burn at least three times more energy (calories) than if you were sitting. Examples of moderate exercise include:  Walking a mile in 15 minutes.  Doing light yard work.  Biking at an easy pace. Most people should get at least 150 minutes (2 hours and 30 minutes) a week of moderate-intensity exercise to maintain their body weight. Vigorous-intensity exercise Vigorous-intensity exercise is any activity that gets you moving enough to burn at least six times more  calories than if you were sitting. When you exercise at this intensity, you should be working hard enough that you are not able to carry on a conversation. Examples of vigorous exercise include:  Running.  Playing a team sport, such as football, basketball, and soccer.  Jumping rope. Most people should get at least 75 minutes (1 hour and 15 minutes) a week of vigorous-intensity exercise to maintain their body weight. How can exercise affect me? When you exercise enough to burn more calories than you eat, you lose weight. Exercise also reduces body fat and builds muscle. The more muscle you have, the more calories you burn. Exercise also:  Improves mood.  Reduces stress and tension.  Improves your overall fitness, flexibility, and endurance.  Increases bone strength. The amount of exercise you need to lose weight depends on:  Your age.  The type of exercise.  Any health conditions you have.  Your overall physical ability. Talk to your health care provider about how much exercise you need and what types of activities are safe for you. What actions can I take to lose weight? Nutrition   Make changes to  your diet as told by your health care provider or diet and nutrition specialist (dietitian). This may include: ? Eating fewer calories. ? Eating more protein. ? Eating less unhealthy fats. ? Eating a diet that includes fresh fruits and vegetables, whole grains, low-fat dairy products, and lean protein. ? Avoiding foods with added fat, salt, and sugar.  Drink plenty of water while you exercise to prevent dehydration or heat stroke. Activity  Choose an activity that you enjoy and set realistic goals. Your health care provider can help you make an exercise plan that works for you.  Exercise at a moderate or vigorous intensity most days of the week. ? The intensity of exercise may vary from person to person. You can tell how intense a workout is for you by paying attention to  your breathing and heartbeat. Most people will notice their breathing and heartbeat get faster with more intense exercise.  Do resistance training twice each week, such as: ? Push-ups. ? Sit-ups. ? Lifting weights. ? Using resistance bands.  Getting short amounts of exercise can be just as helpful as long structured periods of exercise. If you have trouble finding time to exercise, try to include exercise in your daily routine. ? Get up, stretch, and walk around every 30 minutes throughout the day. ? Go for a walk during your lunch break. ? Park your car farther away from your destination. ? If you take public transportation, get off one stop early and walk the rest of the way. ? Make phone calls while standing up and walking around. ? Take the stairs instead of elevators or escalators.  Wear comfortable clothes and shoes with good support.  Do not exercise so much that you hurt yourself, feel dizzy, or get very short of breath. Where to find more information  [place] Department of Health and Human Services: [employer]at  Centers for Disease Control and Prevention (CDC): http://www.wolf.info/ Contact a health care provider:  Before starting a new exercise program.  If you have questions or concerns about your weight.  If you have a medical problem that keeps you from exercising. Get help right away if you have any of the following while exercising:  Injury.  Dizziness.  Difficulty breathing or shortness of breath that does not go away when you stop exercising.  Chest pain.  Rapid heartbeat. Summary  Being overweight increases your risk of heart disease, stroke, diabetes, high blood pressure, and several types of cancer.  Losing weight happens when you burn more calories than you eat.  Reducing the amount of calories you eat in addition to getting regular moderate or vigorous exercise each week helps you lose weight. This information is not intended to replace advice given to you by  your health care provider. Make sure you discuss any questions you have with your health care provider. Document Released: 11/12/2010 Document Revised: 10/23/2017 Document Reviewed: 10/23/2017 Elsevier Interactive Patient Education  2019 [place] Following a healthy eating pattern may help you to achieve and maintain a healthy body weight, reduce the risk of chronic disease, and live a long and productive life. It is important to follow a healthy eating pattern at an appropriate calorie level for your body. Your nutritional needs should be met primarily through food by choosing a variety of nutrient-rich foods. What are tips for following this plan? Reading food labels  Read labels and choose the following: ? Reduced or low sodium. ? Juices with 100% fruit juice. ? Foods with low saturated  fats and high polyunsaturated and monounsaturated fats. ? Foods with whole grains, such as whole wheat, cracked wheat, brown rice, and wild rice. ? Whole grains that are fortified with folic acid. This is recommended for women who are pregnant or who want to become pregnant.  Read labels and avoid the following: ? Foods with a lot of added sugars. These include foods that contain brown sugar, corn sweetener, corn syrup, dextrose, fructose, glucose, high-fructose corn syrup, honey, invert sugar, lactose, malt syrup, maltose, molasses, raw sugar, sucrose, trehalose, or turbinado sugar.  Do not eat more than the following amounts of added sugar per day:  6 teaspoons (25 g) for women.  9 teaspoons (38 g) for men. ? Foods that contain processed or refined starches and grains. ? Refined grain products, such as white flour, degermed cornmeal, white bread, and white rice. Shopping  Choose nutrient-rich snacks, such as vegetables, whole fruits, and nuts. Avoid high-calorie and high-sugar snacks, such as potato chips, fruit snacks, and candy.  Use oil-based dressings and spreads on foods  instead of solid fats such as butter, stick margarine, or cream cheese.  Limit pre-made sauces, mixes, and "instant" products such as flavored rice, instant noodles, and ready-made pasta.  Try more plant-protein sources, such as tofu, tempeh, black beans, edamame, lentils, nuts, and seeds.  Explore eating plans such as the Mediterranean diet or vegetarian diet. Cooking  Use oil to saut or stir-fry foods instead of solid fats such as butter, stick margarine, or lard.  Try baking, boiling, grilling, or broiling instead of frying.  Remove the fatty part of meats before cooking.  Steam vegetables in water or broth. Meal planning   At meals, imagine dividing your plate into fourths: ? One-half of your plate is fruits and vegetables. ? One-fourth of your plate is whole grains. ? One-fourth of your plate is protein, especially lean meats, poultry, eggs, tofu, beans, or nuts.  Include low-fat dairy as part of your daily diet. Lifestyle  Choose healthy options in all settings, including home, work, school, restaurants, or stores.  Prepare your food safely: ? Wash your hands after handling raw meats. ? Keep food preparation surfaces clean by regularly washing with hot, soapy water. ? Keep raw meats separate from ready-to-eat foods, such as fruits and vegetables. ? Cook seafood, meat, poultry, and eggs to the recommended internal temperature. ? Store foods at safe temperatures. In general:  Keep cold foods at 60F (4.4C) or below.  Keep hot foods at 160F (60C) or above.  Keep your freezer at [facility] (-17.8C) or below.  Foods are no longer safe to eat when they have been between the temperatures of 40-160F (4.4-60C) for more than 2 hours. What foods should I eat? Fruits Aim to eat 2 cup-equivalents of fresh, canned (in natural juice), or frozen fruits each day. Examples of 1 cup-equivalent of fruit include 1 small apple, 8 large strawberries, 1 cup canned fruit,  cup dried  fruit, or 1 cup 100% juice. Vegetables Aim to eat 2-3 cup-equivalents of fresh and frozen vegetables each day, including different varieties and colors. Examples of 1 cup-equivalent of vegetables include 2 medium carrots, 2 cups raw, leafy greens, 1 cup chopped vegetable (raw or cooked), or 1 medium baked potato. Grains Aim to eat 6 ounce-equivalents of whole grains each day. Examples of 1 ounce-equivalent of grains include 1 slice of bread, 1 cup ready-to-eat cereal, 3 cups popcorn, or  cup cooked rice, pasta, or cereal. Meats and other proteins Aim to eat  5-6 ounce-equivalents of protein each day. Examples of 1 ounce-equivalent of protein include 1 egg, 1/2 cup nuts or seeds, or 1 tablespoon (16 g) peanut butter. A cut of meat or fish that is the size of a deck of cards is about 3-4 ounce-equivalents.  Of the protein you eat each week, try to have at least 8 ounces come from seafood. This includes salmon, trout, herring, and anchovies. Dairy Aim to eat 3 cup-equivalents of fat-free or low-fat dairy each day. Examples of 1 cup-equivalent of dairy include 1 cup (240 mL) milk, 8 ounces (250 g) yogurt, 1 ounces (44 g) natural cheese, or 1 cup (240 mL) fortified soy milk. Fats and oils  Aim for about 5 teaspoons (21 g) per day. Choose monounsaturated fats, such as canola and olive oils, avocados, peanut butter, and most nuts, or polyunsaturated fats, such as sunflower, corn, and soybean oils, walnuts, pine nuts, sesame seeds, sunflower seeds, and flaxseed. Beverages  Aim for six 8-oz glasses of water per day. Limit coffee to three to five 8-oz cups per day.  Limit caffeinated beverages that have added calories, such as soda and energy drinks.  Limit alcohol intake to no more than 1 drink a day for nonpregnant women and 2 drinks a day for men. One drink equals 12 oz of beer (355 mL), 5 oz of wine (148 mL), or 1 oz of hard liquor (44 mL). Seasoning and other foods  Avoid adding excess amounts  of salt to your foods. Try flavoring foods with herbs and spices instead of salt.  Avoid adding sugar to foods.  Try using oil-based dressings, sauces, and spreads instead of solid fats. This information is based on general [place] nutrition guidelines. For more information, visit [employer].es. Exact amounts may vary based on your nutrition needs. Summary  A healthy eating plan may help you to maintain a healthy weight, reduce the risk of chronic diseases, and stay active throughout your life.  Plan your meals. Make sure you eat the right portions of a variety of nutrient-rich foods.  Try baking, boiling, grilling, or broiling instead of frying.  Choose healthy options in all settings, including home, work, school, restaurants, or stores. This information is not intended to replace advice given to you by your health care provider. Make sure you discuss any questions you have with your health care provider. Document Released: 01/22/2018 Document Revised: 01/22/2018 Document Reviewed: 01/22/2018 Elsevier Interactive Patient Education  2019 [employer]  Obesity, Adult Obesity is the condition of having too much total body fat. Being overweight or obese means that your weight is greater than what is considered healthy for your body size. Obesity is determined by a measurement called BMI. BMI is an estimate of body fat and is calculated from height and weight. For adults, a BMI of 30 or higher is considered obese. Obesity can eventually lead to other health concerns and major illnesses, including:  Stroke.  Coronary artery disease (CAD).  Type 2 diabetes.  Some types of cancer, including cancers of the colon, breast, uterus, and gallbladder.  Osteoarthritis.  High blood pressure (hypertension).  High cholesterol.  Sleep apnea.  Gallbladder stones.  Infertility problems. What are the causes? The main cause of obesity is taking in (consuming) more calories than your body uses  for energy. Other factors that contribute to this condition may include:  Being born with genes that make you more likely to become obese.  Having a medical condition that causes obesity. These conditions include: ? Hypothyroidism. ?  Polycystic ovarian syndrome (PCOS). ? Binge-eating disorder. ? Cushing syndrome.  Taking certain medicines, such as steroids, antidepressants, and seizure medicines.  Not being physically active (sedentary lifestyle).  Living where there are limited places to exercise safely or buy healthy foods.  Not getting enough sleep. What increases the risk? The following factors may increase your risk of this condition:  Having a family history of obesity.  Being a woman of African-American descent.  Being a man of Hispanic descent. What are the signs or symptoms? Having excessive body fat is the main symptom of this condition. How is this diagnosed? This condition may be diagnosed based on:  Your symptoms.  Your medical history.  A physical exam. Your health care provider may measure: ? Your BMI. If you are an adult with a BMI between 25 and less than 30, you are considered overweight. If you are an adult with a BMI of 30 or higher, you are considered obese. ? The distances around your hips and your waist (circumferences). These may be compared to each other to help diagnose your condition. ? Your skinfold thickness. Your health care provider may gently pinch a fold of your skin and measure it. How is this treated? Treatment for this condition often includes changing your lifestyle. Treatment may include some or all of the following:  Dietary changes. Work with your health care provider and a dietitian to set a weight-loss goal that is healthy and reasonable for you. Dietary changes may include eating: ? Smaller portions. A portion size is the amount of a particular food that is healthy for you to eat at one time. This varies from person to  person. ? Low-calorie or low-fat options. ? More whole grains, fruits, and vegetables.  Regular physical activity. This may include aerobic activity (cardio) and strength training.  Medicine to help you lose weight. Your health care provider may prescribe medicine if you are unable to lose 1 pound a week after 6 weeks of eating more healthily and doing more physical activity.  Surgery. Surgical options may include gastric banding and gastric bypass. Surgery may be done if: ? Other treatments have not helped to improve your condition. ? You have a BMI of 40 or higher. ? You have life-threatening health problems related to obesity. Follow these instructions at home:  Eating and drinking   Follow recommendations from your health care provider about what you eat and drink. Your health care provider may advise you to: ? Limit fast foods, sweets, and processed snack foods. ? Choose low-fat options, such as low-fat milk instead of whole milk. ? Eat 5 or more servings of fruits or vegetables every day. ? Eat at home more often. This gives you more control over what you eat. ? Choose healthy foods when you eat out. ? Learn what a healthy portion size is. ? Keep low-fat snacks on hand. ? Avoid sugary drinks, such as soda, fruit juice, iced tea sweetened with sugar, and flavored milk. ? Eat a healthy breakfast.  Drink enough water to keep your urine clear or pale yellow.  Do not go without eating for long periods of time (do not fast) or follow a fad diet. Fasting and fad diets can be unhealthy and even dangerous. Physical Activity  Exercise regularly, as told by your health care provider. Ask your health care provider what types of exercise are safe for you and how often you should exercise.  Warm up and stretch before being active.  Cool down and stretch  after being active.  Rest between periods of activity. Lifestyle  Limit the time that you spend in front of your TV, computer, or  video game system.  Find ways to reward yourself that do not involve food.  Limit alcohol intake to no more than 1 drink a day for nonpregnant women and 2 drinks a day for men. One drink equals 12 oz of beer, 5 oz of wine, or 1 oz of hard liquor. General instructions  Keep a weight loss journal to keep track of the food you eat and how much you exercise you get.  Take over-the-counter and prescription medicines only as told by your health care provider.  Take vitamins and supplements only as told by your health care provider.  Consider joining a support group. Your health care provider may be able to recommend a support group.  Keep all follow-up visits as told by your health care provider. This is important. Contact a health care provider if:  You are unable to meet your weight loss goal after 6 weeks of dietary and lifestyle changes. This information is not intended to replace advice given to you by your health care provider. Make sure you discuss any questions you have with your health care provider. Document Released: 11/17/2004 Document Revised: 03/14/2016 Document Reviewed: 07/29/2015 Elsevier Interactive Patient Education  2019 Elsevier Inc.  Preventing Hypertension Hypertension, commonly called high blood pressure, is when the force of blood pumping through the arteries is too strong. Arteries are blood vessels that carry blood from the heart throughout the body. Over time, hypertension can damage the arteries and decrease blood flow to important parts of the body, including the brain, heart, and kidneys. Often, hypertension does not cause symptoms until blood pressure is very high. For this reason, it is important to have your blood pressure checked on a regular basis. Hypertension can often be prevented with diet and lifestyle changes. If you already have hypertension, you can control it with diet and lifestyle changes, as well as medicine. What nutrition changes can be  made? Maintain a healthy diet. This includes:  Eating less salt (sodium). Ask your health care provider how much sodium is safe for you to have. The general recommendation is to consume less than 1 tsp (2,300 mg) of sodium a day. ? Do not add salt to your food. ? Choose low-sodium options when grocery shopping and eating out.  Limiting fats in your diet. You can do this by eating low-fat or fat-free dairy products and by eating less red meat.  Eating more fruits, vegetables, and whole grains. Make a goal to eat: ? 1-2 cups of fresh fruits and vegetables each day. ? 3-4 servings of whole grains each day.  Avoiding foods and beverages that have added sugars.  Eating fish that contain healthy fats (omega-3 fatty acids), such as mackerel or salmon. If you need help putting together a healthy eating plan, try the DASH diet. This diet is high in fruits, vegetables, and whole grains. It is low in sodium, red meat, and added sugars. DASH stands for Dietary Approaches to Stop Hypertension. What lifestyle changes can be made?   Lose weight if you are overweight. Losing just 3?5% of your body weight can help prevent or control hypertension. ? For example, if your present weight is 200 lb (91 kg), a loss of 3-5% of your weight means losing 6-10 lb (2.7-4.5 kg). ? Ask your health care provider to help you with a diet and exercise plan to safely  lose weight.  Get enough exercise. Do at least 150 minutes of moderate-intensity exercise each week. ? You could do this in short exercise sessions several times a day, or you could do longer exercise sessions a few times a week. For example, you could take a brisk 10-minute walk or bike ride, 3 times a day, for 5 days a week.  Find ways to reduce stress, such as exercising, meditating, listening to music, or taking a yoga class. If you need help reducing stress, ask your health care provider.  Do not smoke. This includes e-cigarettes. Chemicals in tobacco  and nicotine products raise your blood pressure each time you smoke. If you need help quitting, ask your health care provider.  Avoid alcohol. If you drink alcohol, limit alcohol intake to no more than 1 drink a day for nonpregnant women and 2 drinks a day for men. One drink equals 12 oz of beer, 5 oz of wine, or 1 oz of hard liquor. Why are these changes important? Diet and lifestyle changes can help you prevent hypertension, and they may make you feel better overall and improve your quality of life. If you have hypertension, making these changes will help you control it and help prevent major complications, such as:  Hardening and narrowing of arteries that supply blood to: ? Your heart. This can cause a heart attack. ? Your brain. This can cause a stroke. ? Your kidneys. This can cause kidney failure.  Stress on your heart muscle, which can cause heart failure. What can I do to lower my risk?  Work with your health care provider to make a hypertension prevention plan that works for you. Follow your plan and keep all follow-up visits as told by your health care provider.  Learn how to check your blood pressure at home. Make sure that you know your personal target blood pressure, as told by your health care provider. How is this treated? In addition to diet and lifestyle changes, your health care provider may recommend medicines to help lower your blood pressure. You may need to try a few different medicines to find what works best for you. You also may need to take more than one medicine. Take over-the-counter and prescription medicines only as told by your health care provider. Where to find support Your health care provider can help you prevent hypertension and help you keep your blood pressure at a healthy level. Your local hospital or your community may also provide support services and prevention programs. The American Heart Association offers an online support network at:  CheapBootlegs.com.cy Where to find more information Learn more about hypertension from:  [place], Lung, and Blood Institute: ElectronicHangman.is  Centers for Disease Control and Prevention: https://ingram.com/  American Academy of Family Physicians: http://familydoctor.org/familydoctor/en/diseases-conditions/high-blood-pressure.printerview.all.html Learn more about the DASH diet from:  [place], Lung, and [place]: https://www.reyes.com/ Contact a health care provider if:  You think you are having a reaction to medicines you have taken.  You have recurrent headaches or feel dizzy.  You have swelling in your ankles.  You have trouble with your vision. Summary  Hypertension often does not cause any symptoms until blood pressure is very high. It is important to get your blood pressure checked regularly.  Diet and lifestyle changes are the most important steps in preventing hypertension.  By keeping your blood pressure in a healthy range, you can prevent complications like heart attack, heart failure, stroke, and kidney failure.  Work with your health care provider to make  a hypertension prevention plan that works for you. This information is not intended to replace advice given to you by your health care provider. Make sure you discuss any questions you have with your health care provider. Document Released: 10/25/2015 Document Revised: 06/20/2016 Document Reviewed: 06/20/2016 Elsevier Interactive Patient Education  2019 [employer].

## 2019-02-13 NOTE — Progress Notes (Signed)
Pt states she would like to talk about her blood pressure being elevated  x 2 months. She states she feels ok still concerned.

## 2019-02-14 ENCOUNTER — Other Ambulatory Visit: Payer: Self-pay

## 2019-02-14 ENCOUNTER — Ambulatory Visit (INDEPENDENT_AMBULATORY_CARE_PROVIDER_SITE_OTHER): Payer: BC Managed Care – PPO | Admitting: Family Medicine

## 2019-02-14 DIAGNOSIS — Z8639 Personal history of other endocrine, nutritional and metabolic disease: Secondary | ICD-10-CM

## 2019-02-14 DIAGNOSIS — N179 Acute kidney failure, unspecified: Secondary | ICD-10-CM

## 2019-02-14 DIAGNOSIS — Z6841 Body Mass Index (BMI) 40.0 and over, adult: Secondary | ICD-10-CM

## 2019-02-14 DIAGNOSIS — F1010 Alcohol abuse, uncomplicated: Secondary | ICD-10-CM

## 2019-02-15 LAB — LIPID PANEL
Chol/HDL Ratio: 2.1 ratio (ref 0.0–4.4)
Cholesterol, Total: 152 mg/dL (ref 100–199)
HDL: 72 mg/dL (ref >39)
LDL Calculated: 65 mg/dL (ref 0–99)
Triglycerides: 76 mg/dL (ref 0–149)
VLDL Cholesterol Cal: 15 mg/dL (ref 5–40)

## 2019-02-15 LAB — CBC WITH DIFFERENTIAL/PLATELET
Basophils Absolute: 0.1 10*3/uL (ref 0.0–0.2)
Basos: 1 %
EOS (ABSOLUTE): 0.4 10*3/uL (ref 0.0–0.4)
Eos: 8 %
Hematocrit: 36 % (ref 34.0–46.6)
Hemoglobin: 12 g/dL (ref 11.1–15.9)
Immature Grans (Abs): 0 10*3/uL (ref 0.0–0.1)
Immature Granulocytes: 0 %
Lymphocytes Absolute: 2.2 10*3/uL (ref 0.7–3.1)
Lymphs: 38 %
MCH: 31.9 pg (ref 26.6–33.0)
MCHC: 33.3 g/dL (ref 31.5–35.7)
MCV: 96 fL (ref 79–97)
Monocytes Absolute: 0.5 10*3/uL (ref 0.1–0.9)
Monocytes: 8 %
Neutrophils Absolute: 2.6 10*3/uL (ref 1.4–7.0)
Neutrophils: 45 %
Platelets: 269 10*3/uL (ref 150–450)
RBC: 3.76 x10E6/uL — ABNORMAL LOW (ref 3.77–5.28)
RDW: 12.9 % (ref 11.7–15.4)
WBC: 5.7 10*3/uL (ref 3.4–10.8)

## 2019-02-15 LAB — BASIC METABOLIC PANEL
BUN/Creatinine Ratio: 14 (ref 12–28)
BUN: 13 mg/dL (ref 8–27)
CO2: 21 mmol/L (ref 20–29)
Calcium: 9.1 mg/dL (ref 8.7–10.3)
Chloride: 101 mmol/L (ref 96–106)
Creatinine, Ser: 0.92 mg/dL (ref 0.57–1.00)
GFR calc Af Amer: 78 mL/min/{1.73_m2} (ref >59)
GFR calc non Af Amer: 67 mL/min/{1.73_m2} (ref >59)
Glucose: 159 mg/dL — ABNORMAL HIGH (ref 65–99)
Potassium: 4.3 mmol/L (ref 3.5–5.2)
Sodium: 141 mmol/L (ref 134–144)

## 2019-02-15 LAB — HEMOGLOBIN A1C
Est. average glucose Bld gHb Est-mCnc: 103 mg/dL
Hgb A1c MFr Bld: 5.2 % (ref 4.8–5.6)

## 2019-02-25 ENCOUNTER — Telehealth: Payer: Self-pay | Admitting: Registered Nurse

## 2019-02-25 NOTE — Telephone Encounter (Signed)
Copied from CRM [id number]. Topic: General - Other >> Feb 25, 2019  3:12 PM Doreatha Massed wrote: Reason for CRM: pt want to know results she had done 4.23.20

## 2019-02-27 NOTE — Telephone Encounter (Signed)
Please let the patient know:  The cholesterol levels are normal, her kidney function is good.  Your do not have diabetes.  Your hemoglobin is good.  No issues with the white blood cells.

## 2019-02-28 NOTE — Telephone Encounter (Signed)
Patient was advised Will call to make an appointment to [facility] to [facility]

## 2019-03-20 ENCOUNTER — Other Ambulatory Visit: Payer: Self-pay

## 2019-03-20 ENCOUNTER — Ambulatory Visit: Payer: BC Managed Care – PPO | Admitting: Registered Nurse

## 2019-03-20 ENCOUNTER — Encounter: Payer: Self-pay | Admitting: Registered Nurse

## 2019-03-20 VITALS — BP 140/90 | HR 88 | Temp 98.0°F | Resp 16 | Ht 67.0 in | Wt 295.0 lb

## 2019-03-20 DIAGNOSIS — F411 Generalized anxiety disorder: Secondary | ICD-10-CM

## 2019-03-20 DIAGNOSIS — Z1159 Encounter for screening for other viral diseases: Secondary | ICD-10-CM

## 2019-03-20 DIAGNOSIS — I10 Essential (primary) hypertension: Secondary | ICD-10-CM | POA: Diagnosis not present

## 2019-03-20 DIAGNOSIS — Z1211 Encounter for screening for malignant neoplasm of colon: Secondary | ICD-10-CM | POA: Diagnosis not present

## 2019-03-20 MED ORDER — DULOXETINE HCL 30 MG PO CPEP: 30.0000 mg | ORAL_CAPSULE | Freq: Every day | ORAL | 0 refills | Status: DC

## 2019-03-20 MED ORDER — AMLODIPINE BESYLATE 5 MG PO TABS: 5.0000 mg | ORAL_TABLET | Freq: Every day | ORAL | 0 refills | Status: DC

## 2019-03-20 NOTE — Progress Notes (Signed)
Established Patient Office Visit  Subjective:  Patient ID: [name], female    DOB: 05-Jan-1956  Age: 63 y.o. MRN: [id number]  CC:  Chief Complaint  Patient presents with  . Hypertension    pt need for follow-up with HTN    HPI [name] presents for HTN follow up and medication management.   She has been noticing that her BP has been as high as 160s/90s and today is 140/90. Multiple consecutive hypertensive readings warrant the initiation of an antihypertensive agent.  Pt endorses increased anxiety in relation to being primary caretaker to a 62 year old autistic woman who lives with her and her husband. She has been on Duloxetine in the past, is interested in restarting.  Pt states she has concern for weight gain (30lbs in preceding months) and alcohol abuse. She is aware that she drinks too much and is interested in cutting back, believing that this has contributed to her weight gain and stress on her marriage.   Past Medical History:  Diagnosis Date  . Anemia   . Arthritis   . Depression   . Fibromyalgia   . MRSA (methicillin resistant Staphylococcus aureus)   . Obese   . Pneumonia   . Seasonal allergies   . Sleep apnea 2012   took test in [place]-told to use cpap-could not ude    Past Surgical History:  Procedure Laterality Date  . ANTERIOR CERVICAL DECOMP/DISCECTOMY FUSION  05/09/2008   C5-6, C6-7; with arthrodesis also  . GASTRIC BYPASS  2002  . KNEE ARTHROSCOPY  2004   rt and lt knee  . ROTATOR CUFF REPAIR W/ DISTAL CLAVICLE EXCISION  01/06/2006   right  . SHOULDER ARTHROSCOPY  2003   rt  . SHOULDER ARTHROSCOPY  2003   rt  . TONSILLECTOMY    . TOTAL KNEE ARTHROPLASTY  03/29/2004   left  . TOTAL KNEE ARTHROPLASTY  11/07/2003   right  . TOTAL KNEE REVISION  07/30/2010   right; with lateral release    Family History  Problem Relation Age of Onset  . Heart disease Mother   . Cancer Father   . Heart disease Maternal Grandfather   . Alcoholism  Maternal Grandfather   . Heart attack Brother     Social History   Socioeconomic History  . Marital status: Married    Spouse name: Not on file  . Number of children: Not on file  . Years of education: Not on file  . Highest education level: Not on file  Occupational History  . Not on file  Social Needs  . Financial resource strain: Not hard at all  . Food insecurity:    Worry: Never true    Inability: Never true  . Transportation needs:    Medical: No    Non-medical: No  Tobacco Use  . Smoking status: Former Smoker    Last attempt to quit: 05/16/1981    Years since quitting: 37.8  . Smokeless tobacco: Never Used  Substance and Sexual Activity  . Alcohol use: Yes    Alcohol/week: 60.0 standard drinks    Types: 60 Cans of beer per week    Comment: 10 beers / day on average, about 6 days each week  . Drug use: No  . Sexual activity: Yes    Birth control/protection: None, Post-menopausal    Comment: INTERCOURSE AGE 65, SEXUAL PARTNERS MORE THAN 5  Lifestyle  . Physical activity:    Days per week: Not on file  Minutes per session: Not on file  . Stress: Not on file  Relationships  . Social connections:    Talks on phone: Three times a week    Gets together: Twice a week    Attends religious service: 1 to 4 times per year    Active member of club or organization: Not on file    Attends meetings of clubs or organizations: Not on file    Relationship status: Married  . Intimate partner violence:    Fear of current or ex partner: No    Emotionally abused: No    Physically abused: No    Forced sexual activity: No  Other Topics Concern  . Not on file  Social History Narrative   Pt lives at home with husband, retired IT sales professionalfirefighter. They are caretakers to a 62 yo nonverbal autistic woman.     Outpatient Medications Prior to Visit  Medication Sig Dispense Refill  . acyclovir ointment (ZOVIRAX) 5 % Apply topically every 3 (three) hours. 5 g 0  . cyclobenzaprine  (FLEXERIL) 10 MG tablet     . EPINEPHrine 0.3 mg/0.3 mL IJ SOAJ injection Inject 0.3 mLs (0.3 mg total) into the muscle once. (Patient taking differently: Inject 0.3 mg into the muscle daily as needed (allergic reaction). ) 1 Device 2  . HYDROcodone-acetaminophen (NORCO) 7.5-325 MG tablet     . SYMPROIC 0.2 MG TABS Take 1 tablet by mouth daily.     No facility-administered medications prior to visit.     No Known Allergies  ROS Review of Systems  Constitutional: Negative.   HENT: Negative.   Eyes: Negative.   Respiratory: Negative.   Cardiovascular: Negative.   Gastrointestinal: Negative.   Endocrine: Negative.   Genitourinary: Negative.   Musculoskeletal: Negative.   Skin: Negative.   Allergic/Immunologic: Negative.   Neurological: Negative.   Hematological: Negative.   Psychiatric/Behavioral: Positive for decreased concentration. Negative for suicidal ideas. The patient is nervous/anxious.       Objective:    Physical Exam  Constitutional: She is oriented to person, place, and time. She appears well-developed and well-nourished. No distress.  HENT:  Head: Normocephalic and atraumatic.  Neck: Normal range of motion.  Cardiovascular: Normal rate, regular rhythm, normal heart sounds and intact distal pulses. Exam reveals no gallop and no friction rub.  No murmur heard. Pulmonary/Chest: Effort normal and breath sounds normal. No respiratory distress. She has no wheezes. She has no rales. She exhibits no tenderness.  Abdominal: Soft. Bowel sounds are normal. There is no guarding.  Musculoskeletal: Normal range of motion.  Neurological: She is alert and oriented to person, place, and time.  Skin: Skin is warm and dry. She is not diaphoretic.  Psychiatric: She has a normal mood and affect. Her behavior is normal. Judgment and thought content normal.    BP 140/90   Pulse 88   Temp 98 F (36.7 C) (Oral)   Resp 16   Ht 5\' 7"  (1.702 m)   Wt 295 lb (133.8 kg)   SpO2 98%    BMI 46.20 kg/m  Wt Readings from Last 3 Encounters:  03/20/19 295 lb (133.8 kg)  02/13/19 300 lb (136.1 kg)  07/14/18 280 lb (127 kg)     Health Maintenance Due  Topic Date Due  . Hepatitis C Screening  1956/08/25  . TETANUS/TDAP  07/07/1976  . COLONOSCOPY  07/08/2007    There are no preventive care reminders to display for this patient.  No results found for: TSH Lab Results  Component Value Date   WBC 5.7 02/14/2019   HGB 12.0 02/14/2019   HCT 36.0 02/14/2019   MCV 96 02/14/2019   PLT 269 02/14/2019   Lab Results  Component Value Date   NA 141 02/14/2019   K 4.3 02/14/2019   CO2 21 02/14/2019   GLUCOSE 159 (H) 02/14/2019   BUN 13 02/14/2019   CREATININE 0.92 02/14/2019   BILITOT 0.5 06/08/2017   ALKPHOS 86 06/08/2017   AST 40 06/08/2017   ALT 22 06/08/2017   PROT 6.7 06/08/2017   ALBUMIN 3.6 06/08/2017   CALCIUM 9.1 02/14/2019   ANIONGAP 12 06/13/2017   Lab Results  Component Value Date   CHOL 152 02/14/2019   Lab Results  Component Value Date   HDL 72 02/14/2019   Lab Results  Component Value Date   LDLCALC 65 02/14/2019   Lab Results  Component Value Date   TRIG 76 02/14/2019   Lab Results  Component Value Date   CHOLHDL 2.1 02/14/2019   Lab Results  Component Value Date   HGBA1C 5.2 02/14/2019      Assessment & Plan:   Problem List Items Addressed This Visit      Other   Anxiety state   Relevant Medications   DULoxetine (CYMBALTA) 30 MG capsule    Other Visit Diagnoses    Screening for viral disease    -  Primary   Relevant Orders   Hepatitis C Antibody   Essential hypertension       Relevant Medications   amLODipine (NORVASC) 5 MG tablet   Special screening for malignant neoplasms, colon       Relevant Orders   Cologuard      Meds ordered this encounter  Medications  . amLODipine (NORVASC) 5 MG tablet    Sig: Take 1 tablet (5 mg total) by mouth daily.    Dispense:  90 tablet    Refill:  0    Order Specific  Question:   Supervising Provider    Answer:   Collie Siad A [id number]  . DULoxetine (CYMBALTA) 30 MG capsule    Sig: Take 1 capsule (30 mg total) by mouth daily.    Dispense:  90 capsule    Refill:  0    Order Specific Question:   Supervising Provider    Answer:   Doristine Bosworth [id number]    Follow-up: Return in 3 mos  PLAN:  Start amlodipine  PO qd for HTN. Follow up in 3 mos for BP check.  Discussed weight loss and limiting alcohol intake at length - discussed keeping things simple: Diet and exercise, replacing EtOH with over beverages, attending meetings such as AA, and finding a counselor if desired. Pt reports that she is getting her pool liner replaced and will be exercising more in the coming months. Her weight goal is 225lb by Christmas, a goal of 70 lbs in 7 months. While ambitious, we discussed that this is not an unreasonable goal.   We discussed starting duloxetine again. We will start at  PO qd and revisit in 3 mos at follow up, at which time there is potential to increase to . If she has concerns, she can call the clinic.  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 These are all changes that have been encouraged by her pain management provider   Patient encouraged to call clinic with any questions, comments, or concerns.  I spent 30 minutes with this patient, more than 50% of which was spent counseling / educating.  Thank you for your visit with Primary Care at [facility] today.  Janeece Agee, NP [facility] [address] [place], [place] [address] [phone] - [phone]

## 2019-03-20 NOTE — Patient Instructions (Addendum)
   If you have lab work done today you will be contacted with your lab results within the next 2 weeks.  If you have not heard from us then please contact us. The fastest way to get your results is to register for My Chart.   IF you received an x-ray today, you will receive an invoice from [facility]. Please contact [facility] at [phone] with questions or concerns regarding your invoice.   IF you received labwork today, you will receive an invoice from [facility]. Please contact [facility] at [phone] with questions or concerns regarding your invoice.   Our billing staff will not be able to assist you with questions regarding bills from these companies.  You will be contacted with the lab results as soon as they are available. The fastest way to get your results is to activate your My Chart account. Instructions are located on the last page of this paperwork. If you have not heard from us regarding the results in 2 weeks, please contact this office.       Hypertension Hypertension, commonly called high blood pressure, is when the force of blood pumping through the arteries is too strong. The arteries are the blood vessels that carry blood from the heart throughout the body. Hypertension forces the heart to work harder to pump blood and may cause arteries to become narrow or stiff. Having untreated or uncontrolled hypertension can cause heart attacks, strokes, kidney disease, and other problems. A blood pressure reading consists of a higher number over a lower number. Ideally, your blood pressure should be below 120/80. The first ("top") number is called the systolic pressure. It is a measure of the pressure in your arteries as your heart beats. The second ("bottom") number is called the diastolic pressure. It is a measure of the pressure in your arteries as the heart relaxes. What are the causes? The cause of this condition is not known. What increases the  risk? Some risk factors for high blood pressure are under your control. Others are not. Factors you can change  Smoking.  Having type 2 diabetes mellitus, high cholesterol, or both.  Not getting enough exercise or physical activity.  Being overweight.  Having too much fat, sugar, calories, or salt (sodium) in your diet.  Drinking too much alcohol. Factors that are difficult or impossible to change  Having chronic kidney disease.  Having a family history of high blood pressure.  Age. Risk increases with age.  Race. You may be at higher risk if you are African-American.  Gender. Men are at higher risk than women before age 45. After age 65, women are at higher risk than men.  Having obstructive sleep apnea.  Stress. What are the signs or symptoms? Extremely high blood pressure (hypertensive crisis) may cause:  Headache.  Anxiety.  Shortness of breath.  Nosebleed.  Nausea and vomiting.  Severe chest pain.  Jerky movements you cannot control (seizures). How is this diagnosed? This condition is diagnosed by measuring your blood pressure while you are seated, with your arm resting on a surface. The cuff of the blood pressure monitor will be placed directly against the skin of your upper arm at the level of your heart. It should be measured at least twice using the same arm. Certain conditions can cause a difference in blood pressure between your right and left arms. Certain factors can cause blood pressure readings to be lower or higher than normal (elevated) for a short period of time:    When your blood pressure is higher when you are in a health care provider's office than when you are at home, this is called white coat hypertension. Most people with this condition do not need medicines.  When your blood pressure is higher at home than when you are in a health care provider's office, this is called masked hypertension. Most people with this condition may need medicines  to control blood pressure. If you have a high blood pressure reading during one visit or you have normal blood pressure with other risk factors:  You may be asked to return on a different day to have your blood pressure checked again.  You may be asked to monitor your blood pressure at home for 1 week or longer. If you are diagnosed with hypertension, you may have other blood or imaging tests to help your health care provider understand your overall risk for other conditions. How is this treated? This condition is treated by making healthy lifestyle changes, such as eating healthy foods, exercising more, and reducing your alcohol intake. Your health care provider may prescribe medicine if lifestyle changes are not enough to get your blood pressure under control, and if:  Your systolic blood pressure is above 130.  Your diastolic blood pressure is above 80. Your personal target blood pressure may vary depending on your medical conditions, your age, and other factors. Follow these instructions at home: Eating and drinking   Eat a diet that is high in fiber and potassium, and low in sodium, added sugar, and fat. An example eating plan is called the DASH (Dietary Approaches to Stop Hypertension) diet. To eat this way: ? Eat plenty of fresh fruits and vegetables. Try to fill half of your plate at each meal with fruits and vegetables. ? Eat whole grains, such as whole wheat pasta, Quintela rice, or whole grain bread. Fill about one quarter of your plate with whole grains. ? Eat or drink low-fat dairy products, such as skim milk or low-fat yogurt. ? Avoid fatty cuts of meat, processed or cured meats, and poultry with skin. Fill about one quarter of your plate with lean proteins, such as fish, chicken without skin, beans, eggs, and tofu. ? Avoid premade and processed foods. These tend to be higher in sodium, added sugar, and fat.  Reduce your daily sodium intake. Most people with hypertension should  eat less than 1,500 mg of sodium a day.  Limit alcohol intake to no more than 1 drink a day for nonpregnant women and 2 drinks a day for men. One drink equals 12 oz of beer, 5 oz of wine, or 1 oz of hard liquor. Lifestyle   Work with your health care provider to maintain a healthy body weight or to lose weight. Ask what an ideal weight is for you.  Get at least 30 minutes of exercise that causes your heart to beat faster (aerobic exercise) most days of the week. Activities may include walking, swimming, or biking.  Include exercise to strengthen your muscles (resistance exercise), such as pilates or lifting weights, as part of your weekly exercise routine. Try to do these types of exercises for 30 minutes at least 3 days a week.  Do not use any products that contain nicotine or tobacco, such as cigarettes and e-cigarettes. If you need help quitting, ask your health care provider.  Monitor your blood pressure at home as told by your health care provider.  Keep all follow-up visits as told by your health care provider.   This is important. Medicines  Take over-the-counter and prescription medicines only as told by your health care provider. Follow directions carefully. Blood pressure medicines must be taken as prescribed.  Do not skip doses of blood pressure medicine. Doing this puts you at risk for problems and can make the medicine less effective.  Ask your health care provider about side effects or reactions to medicines that you should watch for. Contact a health care provider if:  You think you are having a reaction to a medicine you are taking.  You have headaches that keep coming back (recurring).  You feel dizzy.  You have swelling in your ankles.  You have trouble with your vision. Get help right away if:  You develop a severe headache or confusion.  You have unusual weakness or numbness.  You feel faint.  You have severe pain in your chest or abdomen.  You vomit  repeatedly.  You have trouble breathing. Summary  Hypertension is when the force of blood pumping through your arteries is too strong. If this condition is not controlled, it may put you at risk for serious complications.  Your personal target blood pressure may vary depending on your medical conditions, your age, and other factors. For most people, a normal blood pressure is less than 120/80.  Hypertension is treated with lifestyle changes, medicines, or a combination of both. Lifestyle changes include weight loss, eating a healthy, low-sodium diet, exercising more, and limiting alcohol. This information is not intended to replace advice given to you by your health care provider. Make sure you discuss any questions you have with your health care provider. Document Released: 10/10/2005 Document Revised: 09/07/2016 Document Reviewed: 09/07/2016 Elsevier Interactive Patient Education  2019 [employer]   Managing Your Hypertension Hypertension is commonly called high blood pressure. This is when the force of your blood pressing against the walls of your arteries is too strong. Arteries are blood vessels that carry blood from your heart throughout your body. Hypertension forces the heart to work harder to pump blood, and may cause the arteries to become narrow or stiff. Having untreated or uncontrolled hypertension can cause heart attack, stroke, kidney disease, and other problems. What are blood pressure readings? A blood pressure reading consists of a higher number over a lower number. Ideally, your blood pressure should be below 120/80. The first ("top") number is called the systolic pressure. It is a measure of the pressure in your arteries as your heart beats. The second ("bottom") number is called the diastolic pressure. It is a measure of the pressure in your arteries as the heart relaxes. What does my blood pressure reading mean? Blood pressure is classified into four stages. Based on your  blood pressure reading, your health care provider may use the following stages to determine what type of treatment you need, if any. Systolic pressure and diastolic pressure are measured in a unit called mm Hg. Normal  Systolic pressure: below 120.  Diastolic pressure: below 80. Elevated  Systolic pressure: 120-129.  Diastolic pressure: below 80. Hypertension stage 1  Systolic pressure: 130-139.  Diastolic pressure: 80-89. Hypertension stage 2  Systolic pressure: 140 or above.  Diastolic pressure: 90 or above. What health risks are associated with hypertension? Managing your hypertension is an important responsibility. Uncontrolled hypertension can lead to:  A heart attack.  A stroke.  A weakened blood vessel (aneurysm).  Heart failure.  Kidney damage.  Eye damage.  Metabolic syndrome.  Memory and concentration problems. What changes can I make   to manage my hypertension? Hypertension can be managed by making lifestyle changes and possibly by taking medicines. Your health care provider will help you make a plan to bring your blood pressure within a normal range. Eating and drinking   Eat a diet that is high in fiber and potassium, and low in salt (sodium), added sugar, and fat. An example eating plan is called the DASH (Dietary Approaches to Stop Hypertension) diet. To eat this way: ? Eat plenty of fresh fruits and vegetables. Try to fill half of your plate at each meal with fruits and vegetables. ? Eat whole grains, such as whole wheat pasta, brown rice, or whole grain bread. Fill about one quarter of your plate with whole grains. ? Eat low-fat diary products. ? Avoid fatty cuts of meat, processed or cured meats, and poultry with skin. Fill about one quarter of your plate with lean proteins such as fish, chicken without skin, beans, eggs, and tofu. ? Avoid premade and processed foods. These tend to be higher in sodium, added sugar, and fat.  Reduce your daily sodium  intake. Most people with hypertension should eat less than 1,500 mg of sodium a day.  Limit alcohol intake to no more than 1 drink a day for nonpregnant women and 2 drinks a day for men. One drink equals 12 oz of beer, 5 oz of wine, or 1 oz of hard liquor. Lifestyle  Work with your health care provider to maintain a healthy body weight, or to lose weight. Ask what an ideal weight is for you.  Get at least 30 minutes of exercise that causes your heart to beat faster (aerobic exercise) most days of the week. Activities may include walking, swimming, or biking.  Include exercise to strengthen your muscles (resistance exercise), such as weight lifting, as part of your weekly exercise routine. Try to do these types of exercises for 30 minutes at least 3 days a week.  Do not use any products that contain nicotine or tobacco, such as cigarettes and e-cigarettes. If you need help quitting, ask your health care provider.  Control any long-term (chronic) conditions you have, such as high cholesterol or diabetes. Monitoring  Monitor your blood pressure at home as told by your health care provider. Your personal target blood pressure may vary depending on your medical conditions, your age, and other factors.  Have your blood pressure checked regularly, as often as told by your health care provider. Working with your health care provider  Review all the medicines you take with your health care provider because there may be side effects or interactions.  Talk with your health care provider about your diet, exercise habits, and other lifestyle factors that may be contributing to hypertension.  Visit your health care provider regularly. Your health care provider can help you create and adjust your plan for managing hypertension. Will I need medicine to control my blood pressure? Your health care provider may prescribe medicine if lifestyle changes are not enough to get your blood pressure under control,  and if:  Your systolic blood pressure is 130 or higher.  Your diastolic blood pressure is 80 or higher. Take medicines only as told by your health care provider. Follow the directions carefully. Blood pressure medicines must be taken as prescribed. The medicine does not work as well when you skip doses. Skipping doses also puts you at risk for problems. Contact a health care provider if:  You think you are having a reaction to medicines you have  taken.  You have repeated (recurrent) headaches.  You feel dizzy.  You have swelling in your ankles.  You have trouble with your vision. Get help right away if:  You develop a severe headache or confusion.  You have unusual weakness or numbness, or you feel faint.  You have severe pain in your chest or abdomen.  You vomit repeatedly.  You have trouble breathing. Summary  Hypertension is when the force of blood pumping through your arteries is too strong. If this condition is not controlled, it may put you at risk for serious complications.  Your personal target blood pressure may vary depending on your medical conditions, your age, and other factors. For most people, a normal blood pressure is less than 120/80.  Hypertension is managed by lifestyle changes, medicines, or both. Lifestyle changes include weight loss, eating a healthy, low-sodium diet, exercising more, and limiting alcohol. This information is not intended to replace advice given to you by your health care provider. Make sure you discuss any questions you have with your health care provider. Document Released: 07/04/2012 Document Revised: 09/07/2016 Document Reviewed: 09/07/2016 Elsevier Interactive Patient Education  2019 [employer]   DASH Eating Plan DASH stands for "Dietary Approaches to Stop Hypertension." The DASH eating plan is a healthy eating plan that has been shown to reduce high blood pressure (hypertension). It may also reduce your risk for type 2  diabetes, heart disease, and stroke. The DASH eating plan may also help with weight loss. What are tips for following this plan?  General guidelines  Avoid eating more than 2,300 mg (milligrams) of salt (sodium) a day. If you have hypertension, you may need to reduce your sodium intake to 1,500 mg a day.  Limit alcohol intake to no more than 1 drink a day for nonpregnant women and 2 drinks a day for men. One drink equals 12 oz of beer, 5 oz of wine, or 1 oz of hard liquor.  Work with your health care provider to maintain a healthy body weight or to lose weight. Ask what an ideal weight is for you.  Get at least 30 minutes of exercise that causes your heart to beat faster (aerobic exercise) most days of the week. Activities may include walking, swimming, or biking.  Work with your health care provider or diet and nutrition specialist (dietitian) to adjust your eating plan to your individual calorie needs. Reading food labels   Check food labels for the amount of sodium per serving. Choose foods with less than 5 percent of the Daily Value of sodium. Generally, foods with less than 300 mg of sodium per serving fit into this eating plan.  To find whole grains, look for the word "whole" as the first word in the ingredient list. Shopping  Buy products labeled as "low-sodium" or "no salt added."  Buy fresh foods. Avoid canned foods and premade or frozen meals. Cooking  Avoid adding salt when cooking. Use salt-free seasonings or herbs instead of table salt or sea salt. Check with your health care provider or pharmacist before using salt substitutes.  Do not fry foods. Cook foods using healthy methods such as baking, boiling, grilling, and broiling instead.  Cook with heart-healthy oils, such as olive, canola, soybean, or sunflower oil. Meal planning  Eat a balanced diet that includes: ? 5 or more servings of fruits and vegetables each day. At each meal, try to fill half of your plate with  fruits and vegetables. ? Up to 6-8 servings of   whole grains each day. ? Less than 6 oz of lean meat, poultry, or fish each day. A 3-oz serving of meat is about the same size as a deck of cards. One egg equals 1 oz. ? 2 servings of low-fat dairy each day. ? A serving of nuts, seeds, or beans 5 times each week. ? Heart-healthy fats. Healthy fats called Omega-3 fatty acids are found in foods such as flaxseeds and coldwater fish, like sardines, salmon, and mackerel.  Limit how much you eat of the following: ? Canned or prepackaged foods. ? Food that is high in trans fat, such as fried foods. ? Food that is high in saturated fat, such as fatty meat. ? Sweets, desserts, sugary drinks, and other foods with added sugar. ? Full-fat dairy products.  Do not salt foods before eating.  Try to eat at least 2 vegetarian meals each week.  Eat more home-cooked food and less restaurant, buffet, and fast food.  When eating at a restaurant, ask that your food be prepared with less salt or no salt, if possible. What foods are recommended? The items listed may not be a complete list. Talk with your dietitian about what dietary choices are best for you. Grains Whole-grain or whole-wheat bread. Whole-grain or whole-wheat pasta. Brown rice. Oatmeal. Quinoa. Bulgur. Whole-grain and low-sodium cereals. Pita bread. Low-fat, low-sodium crackers. Whole-wheat flour tortillas. Vegetables Fresh or frozen vegetables (raw, steamed, roasted, or grilled). Low-sodium or reduced-sodium tomato and vegetable juice. Low-sodium or reduced-sodium tomato sauce and tomato paste. Low-sodium or reduced-sodium canned vegetables. Fruits All fresh, dried, or frozen fruit. Canned fruit in natural juice (without added sugar). Meat and other protein foods Skinless chicken or turkey. Ground chicken or turkey. Pork with fat trimmed off. Fish and seafood. Egg whites. Dried beans, peas, or lentils. Unsalted nuts, nut butters, and seeds.  Unsalted canned beans. Lean cuts of beef with fat trimmed off. Low-sodium, lean deli meat. Dairy Low-fat (1%) or fat-free (skim) milk. Fat-free, low-fat, or reduced-fat cheeses. Nonfat, low-sodium ricotta or cottage cheese. Low-fat or nonfat yogurt. Low-fat, low-sodium cheese. Fats and oils Soft margarine without trans fats. Vegetable oil. Low-fat, reduced-fat, or light mayonnaise and salad dressings (reduced-sodium). Canola, safflower, olive, soybean, and sunflower oils. Avocado. Seasoning and other foods Herbs. Spices. Seasoning mixes without salt. Unsalted popcorn and pretzels. Fat-free sweets. What foods are not recommended? The items listed may not be a complete list. Talk with your dietitian about what dietary choices are best for you. Grains Baked goods made with fat, such as croissants, muffins, or some breads. Dry pasta or rice meal packs. Vegetables Creamed or fried vegetables. Vegetables in a cheese sauce. Regular canned vegetables (not low-sodium or reduced-sodium). Regular canned tomato sauce and paste (not low-sodium or reduced-sodium). Regular tomato and vegetable juice (not low-sodium or reduced-sodium). Pickles. Olives. Fruits Canned fruit in a light or heavy syrup. Fried fruit. Fruit in cream or butter sauce. Meat and other protein foods Fatty cuts of meat. Ribs. Fried meat. Bacon. Sausage. Bologna and other processed lunch meats. Salami. Fatback. Hotdogs. Bratwurst. Salted nuts and seeds. Canned beans with added salt. Canned or smoked fish. Whole eggs or egg yolks. Chicken or turkey with skin. Dairy Whole or 2% milk, cream, and half-and-half. Whole or full-fat cream cheese. Whole-fat or sweetened yogurt. Full-fat cheese. Nondairy creamers. Whipped toppings. Processed cheese and cheese spreads. Fats and oils Butter. Stick margarine. Lard. Shortening. Ghee. Bacon fat. Tropical oils, such as coconut, palm kernel, or palm oil. Seasoning and other foods Salted popcorn   and  pretzels. Onion salt, garlic salt, seasoned salt, table salt, and sea salt. Worcestershire sauce. Tartar sauce. Barbecue sauce. Teriyaki sauce. Soy sauce, including reduced-sodium. Steak sauce. Canned and packaged gravies. Fish sauce. Oyster sauce. Cocktail sauce. Horseradish that you find on the shelf. Ketchup. Mustard. Meat flavorings and tenderizers. Bouillon cubes. Hot sauce and Tabasco sauce. Premade or packaged marinades. Premade or packaged taco seasonings. Relishes. Regular salad dressings. Where to find more information:  National Heart, Lung, and Blood Institute: PopSteam.is  American Heart Association: www.heart.org Summary  The DASH eating plan is a healthy eating plan that has been shown to reduce high blood pressure (hypertension). It may also reduce your risk for type 2 diabetes, heart disease, and stroke.  With the DASH eating plan, you should limit salt (sodium) intake to 2,300 mg a day. If you have hypertension, you may need to reduce your sodium intake to 1,500 mg a day.  When on the DASH eating plan, aim to eat more fresh fruits and vegetables, whole grains, lean proteins, low-fat dairy, and heart-healthy fats.  Work with your health care provider or diet and nutrition specialist (dietitian) to adjust your eating plan to your individual calorie needs. This information is not intended to replace advice given to you by your health care provider. Make sure you discuss any questions you have with your health care provider. Document Released: 09/29/2011 Document Revised: 10/03/2016 Document Reviewed: 10/03/2016 Elsevier Interactive Patient Education  2019 [employer].

## 2019-03-21 LAB — HEPATITIS C ANTIBODY: Hep C Virus Ab: 0.1 s/co ratio (ref 0.0–0.9)

## 2019-06-13 ENCOUNTER — Other Ambulatory Visit: Payer: Self-pay | Admitting: Registered Nurse

## 2019-06-13 DIAGNOSIS — I10 Essential (primary) hypertension: Secondary | ICD-10-CM

## 2019-06-13 DIAGNOSIS — F411 Generalized anxiety disorder: Secondary | ICD-10-CM

## 2019-06-22 ENCOUNTER — Other Ambulatory Visit: Payer: Self-pay | Admitting: Registered Nurse

## 2019-06-22 DIAGNOSIS — I10 Essential (primary) hypertension: Secondary | ICD-10-CM

## 2019-08-24 ENCOUNTER — Other Ambulatory Visit: Payer: Self-pay

## 2019-08-24 DIAGNOSIS — Z20822 Contact with and (suspected) exposure to covid-19: Secondary | ICD-10-CM

## 2019-08-26 ENCOUNTER — Telehealth (INDEPENDENT_AMBULATORY_CARE_PROVIDER_SITE_OTHER): Payer: BC Managed Care – PPO | Admitting: Registered Nurse

## 2019-08-26 ENCOUNTER — Other Ambulatory Visit: Payer: Self-pay

## 2019-08-26 DIAGNOSIS — G44209 Tension-type headache, unspecified, not intractable: Secondary | ICD-10-CM

## 2019-08-26 LAB — NOVEL CORONAVIRUS, NAA: SARS-CoV-2, NAA: NOT DETECTED

## 2019-08-26 NOTE — Progress Notes (Signed)
CC: Patient states that she has had a migraine for 2 weeks. She thinks it is related to stress. Patient also has been experiencing nose bleeds Saturday and Sunday. She was tested for COVID  2 weeks ago and results are negative.

## 2019-08-26 NOTE — Progress Notes (Signed)
Telemedicine Encounter- SOAP NOTE Established Patient  This telephone encounter was conducted with the patient's (or proxy's) verbal consent via audio telecommunications: yes  Patient was instructed to have this encounter in a suitably private space; and to only have persons present to whom they give permission to participate. In addition, patient identity was confirmed by use of name plus two identifiers (DOB and address).  I discussed the limitations, risks, security and privacy concerns of performing an evaluation and management service by telephone and the availability of in person appointments. I also discussed with the patient that there may be a patient responsible charge related to this service. The patient expressed understanding and agreed to proceed.  I spent a total of 14 minutes talking with the patient or their proxy.  No chief complaint on file.   Subjective   [name] is a 63 y.o. established patient. Telephone visit today for ongoing headache and nose bleeds  HPI [name] reports that she has had a headache for around 10 days now. She notes that it feels like a migraine, suspects it is related to stress. She states it was improved last week when her and her husband went to the beach and relaxed. She drinks diet mtn dew daily. No changes to diet or activity level. Denies chest pain, shob/doe, dependent edema, visual changes, NVD. Has not tried treating her headache - but does take Norco for joint pain, states this did not relieve headache.  Also reports nose bleeds on 2 days ago and yesterday. Two instances on each day. Reports profuse bleeding that has since stopped. Notes that she was unable to tilt head back, as blood would then drip in mouth and make her nauseous. She states her BP has been under control. She again denies the CV symptoms listed above.   Otherwise, reports a significant amount of stress. She is primary caretaker to a disabled, nonverbal adult.    Patient Active Problem List   Diagnosis Date Noted  . Pneumonia 06/10/2017  . CAP (community acquired pneumonia) 06/09/2017  . AKI (acute kidney injury) ([place]) 06/09/2017  . 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 Insomnia   . Cellulitis and abscess of leg 06/19/2016  . 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 Cellulitis of left lower extremity 01/09/2016  . Psoriatic arthritis ([place])   . Cough   . Wheezing   . Chronic anemia 12/05/2015  . Chronic venous insufficiency 12/04/2015  . Hoarding disorder 03/05/2015  . Alcohol abuse 11/21/2014  . Anxiety state 11/21/2014  . BMI 40.0-44.9, adult ([place]) 11/21/2014  . Epistaxis 09/24/2014  . Bruising 05/03/2014  . Hematemesis 03/07/2014  . Depressive disorder 12/09/2013  . GAD (generalized anxiety disorder) 09/17/2013  . Fibromyalgia 07/22/2013    Past Medical History:  Diagnosis Date  . Anemia   . Arthritis   . Depression   . Fibromyalgia   . MRSA (methicillin resistant Staphylococcus aureus)   . Obese   . Pneumonia   . Seasonal allergies   . Sleep apnea 2012   took test in [place]-told to use cpap-could not ude    Current Outpatient Medications  Medication Sig Dispense Refill  . acyclovir ointment (ZOVIRAX) 5 % Apply topically every 3 (three) hours. 5 g 0  . amLODipine (NORVASC) 5 MG tablet TAKE 1 TABLET BY MOUTH EVERY DAY 90 tablet 0  . cyclobenzaprine (FLEXERIL) 10 MG tablet     . DULoxetine (CYMBALTA) 30 MG capsule TAKE 1 CAPSULE BY MOUTH EVERY DAY 90 capsule 0  .  EPINEPHrine 0.3 mg/0.3 mL IJ SOAJ injection Inject 0.3 mLs (0.3 mg total) into the muscle once. (Patient taking differently: Inject 0.3 mg into the muscle daily as needed (allergic reaction). ) 1 Device 2  . HYDROcodone-acetaminophen (NORCO) 7.5-325 MG tablet     . SYMPROIC 0.2 MG TABS Take 1 tablet by mouth daily.     No current facility-administered medications for this visit.     No Known Allergies  Social History   Socioeconomic History  . Marital status:  Married    Spouse name: Not on file  . Number of children: Not on file  . Years of education: Not on file  . Highest education level: Not on file  Occupational History  . Not on file  Social Needs  . Financial resource strain: Not hard at all  . Food insecurity    Worry: Never true    Inability: Never true  . Transportation needs    Medical: No    Non-medical: No  Tobacco Use  . Smoking status: Former Smoker    Quit date: 05/16/1981    Years since quitting: 38.3  . Smokeless tobacco: Never Used  Substance and Sexual Activity  . Alcohol use: Yes    Alcohol/week: 60.0 standard drinks    Types: 60 Cans of beer per week    Comment: 10 beers / day on average, about 6 days each week  . Drug use: No  . Sexual activity: Yes    Birth control/protection: None, Post-menopausal    Comment: INTERCOURSE AGE 11, SEXUAL PARTNERS MORE THAN 5  Lifestyle  . Physical activity    Days per week: Not on file    Minutes per session: Not on file  . Stress: Not on file  Relationships  . Social Musician on phone: Three times a week    Gets together: Twice a week    Attends religious service: 1 to 4 times per year    Active member of club or organization: Not on file    Attends meetings of clubs or organizations: Not on file    Relationship status: Married  . Intimate partner violence    Fear of current or ex partner: No    Emotionally abused: No    Physically abused: No    Forced sexual activity: No  Other Topics Concern  . Not on file  Social History Narrative   Pt lives at home with husband, retired IT sales professional. They are caretakers to a 62 yo nonverbal autistic woman.     ROS As above  Objective   Vitals as reported by the patient: There were no vitals filed for this visit.  There are no diagnoses linked to this encounter.  PLAN  Given normal BP readings and lack of CV symptoms - feel this is likely tension type headache. Suggest OTCs, cutting down diet sodas,  improving hydration (she does not drink any water, drinks multiple diet mtn dews daily). Aspartame may be big contributor to these headaches.   Again, with BP wnl and no CV symptoms, suspect epistaxis due to more benign cause - feel that this is probably dry air at beach and recent cooler weather. Will try saline nasal spray. Recommend that she lean forward rather than back with nosebleeds to help clotting and prevent PND of blood.   Discussed ED precautions, reasons to follow up with [place] in office. Pt demonstrated understanding.  Patient encouraged to call clinic with any questions, comments, or concerns.   I  discussed the assessment and treatment plan with the patient. The patient was provided an opportunity to ask questions and all were answered. The patient agreed with the plan and demonstrated an understanding of the instructions.   The patient was advised to call back or seek an in-person evaluation if the symptoms worsen or if the condition fails to improve as anticipated.  I provided 14 minutes of non-face-to-face time during this encounter.  Janeece Ageeichard Priti Consoli, NP  Primary [facility] at [facility]

## 2019-09-07 ENCOUNTER — Other Ambulatory Visit: Payer: Self-pay

## 2019-09-07 ENCOUNTER — Emergency Department (HOSPITAL_COMMUNITY)
Admission: EM | Admit: 2019-09-07 | Discharge: 2019-09-07 | Disposition: A | Discharge status: Home / Self Care | Payer: BC Managed Care – PPO | Attending: Emergency Medicine | Admitting: Emergency Medicine

## 2019-09-07 DIAGNOSIS — Z96653 Presence of artificial knee joint, bilateral: Secondary | ICD-10-CM | POA: Insufficient documentation

## 2019-09-07 DIAGNOSIS — R040 Epistaxis: Secondary | ICD-10-CM | POA: Diagnosis present

## 2019-09-07 DIAGNOSIS — Z87891 Personal history of nicotine dependence: Secondary | ICD-10-CM | POA: Insufficient documentation

## 2019-09-07 DIAGNOSIS — R030 Elevated blood-pressure reading, without diagnosis of hypertension: Secondary | ICD-10-CM | POA: Diagnosis not present

## 2019-09-07 DIAGNOSIS — Z79899 Other long term (current) drug therapy: Secondary | ICD-10-CM | POA: Diagnosis not present

## 2019-09-07 MED ORDER — SULFAMETHOXAZOLE-TRIMETHOPRIM 800-160 MG PO TABS: 1.0000 | ORAL_TABLET | Freq: Two times a day (BID) | ORAL | 0 refills | Status: AC

## 2019-09-07 MED ORDER — TRANEXAMIC ACID 1000 MG/10ML IV SOLN
500.0000 mg | Freq: Once | INTRAVENOUS | Status: AC
  Administered 2019-09-07: 500 mg via TOPICAL
  Filled 2019-09-07: qty 10

## 2019-09-07 MED ORDER — OXYCODONE-ACETAMINOPHEN 5-325 MG PO TABS
1.0000 | ORAL_TABLET | Freq: Once | ORAL | Status: AC
  Administered 2019-09-07: 06:00:00 1 via ORAL
  Filled 2019-09-07: qty 1

## 2019-09-07 NOTE — ED Provider Notes (Signed)
[facility] EMERGENCY DEPARTMENT Provider Note   CSN: [id number] Arrival date & time: 09/07/19  0249    History   Chief Complaint Chief Complaint  Patient presents with  . Epistaxis    HPI [name] is a 63 y.o. female.   The history is provided by the patient.  Epistaxis She has been having left-sided epistaxis intermittently over the last week.  She states that her pain 6 separate episodes of nosebleeds, but tonight she was unable to control it.  She denies any trauma.  She is not on aspirin or any anticoagulants.  She states that once before she had nosebleed which required packing.  Tonight, she tried inserting a tampon in her nose, but it fell out once it had become saturated with blood.  Past Medical History:  Diagnosis Date  . Anemia   . Arthritis   . Depression   . Fibromyalgia   . MRSA (methicillin resistant Staphylococcus aureus)   . Obese   . Pneumonia   . Seasonal allergies   . Sleep apnea 2012   took test in [place]-told to use cpap-could not ude    Patient Active Problem List   Diagnosis Date Noted  . Pneumonia 06/10/2017  . CAP (community acquired pneumonia) 06/09/2017  . AKI (acute kidney injury) ([place]) 06/09/2017  . 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 Insomnia   . Cellulitis and abscess of leg 06/19/2016  . 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 Cellulitis of left lower extremity 01/09/2016  . Psoriatic arthritis ([place])   . Cough   . Wheezing   . Chronic anemia 12/05/2015  . Chronic venous insufficiency 12/04/2015  . Hoarding disorder 03/05/2015  . Alcohol abuse 11/21/2014  . Anxiety state 11/21/2014  . BMI 40.0-44.9, adult ([place]) 11/21/2014  . Epistaxis 09/24/2014  . Bruising 05/03/2014  . Hematemesis 03/07/2014  . Depressive disorder 12/09/2013  . GAD (generalized anxiety disorder) 09/17/2013  . Fibromyalgia 07/22/2013    Past Surgical History:  Procedure Laterality Date  . ANTERIOR CERVICAL DECOMP/DISCECTOMY  FUSION  05/09/2008   C5-6, C6-7; with arthrodesis also  . GASTRIC BYPASS  2002  . KNEE ARTHROSCOPY  2004   rt and lt knee  . ROTATOR CUFF REPAIR W/ DISTAL CLAVICLE EXCISION  01/06/2006   right  . SHOULDER ARTHROSCOPY  2003   rt  . SHOULDER ARTHROSCOPY  2003   rt  . TONSILLECTOMY    . TOTAL KNEE ARTHROPLASTY  03/29/2004   left  . TOTAL KNEE ARTHROPLASTY  11/07/2003   right  . TOTAL KNEE REVISION  07/30/2010   right; with lateral release     OB History    Gravida  0   Para  0   Term  0   Preterm  0   AB  0   Living  0     SAB  0   TAB  0   Ectopic  0   Multiple  0   Live Births               Home Medications    Prior to Admission medications   Medication Sig Start Date End Date Taking? Authorizing Provider  acyclovir ointment (ZOVIRAX) 5 % Apply topically every 3 (three) hours. 06/25/16   Nicolette Bang, DO  amLODipine (NORVASC) 5 MG tablet TAKE 1 TABLET BY MOUTH EVERY DAY 06/13/19   Maximiano Coss, NP  cyclobenzaprine (FLEXERIL) 10 MG tablet  02/07/19   [provider]  DULoxetine (CYMBALTA) 30 MG  capsule TAKE 1 CAPSULE BY MOUTH EVERY DAY 06/13/19   Janeece Agee, NP  EPINEPHrine 0.3 mg/0.3 mL IJ SOAJ injection Inject 0.3 mLs (0.3 mg total) into the muscle once. Patient taking differently: Inject 0.3 mg into the muscle daily as needed (allergic reaction).  05/07/15   Lorre Nick, MD  HYDROcodone-acetaminophen [facility]) 7.5-325 MG tablet  02/11/19   [provider]  SYMPROIC 0.2 MG TABS Take 1 tablet by mouth daily. 03/11/19   [provider]    Family History Family History  Problem Relation Age of Onset  . Heart disease Mother   . Cancer Father   . Heart disease Maternal Grandfather   . Alcoholism Maternal Grandfather   . Heart attack Brother     Social History Social History   Tobacco Use  . Smoking status: Former Smoker    Quit date: 05/16/1981    Years since quitting: 38.3  . Smokeless tobacco: Never Used   Substance Use Topics  . Alcohol use: Yes    Alcohol/week: 60.0 standard drinks    Types: 60 Cans of beer per week    Comment: 10 beers / day on average, about 6 days each week  . Drug use: No     Allergies   Patient has no known allergies.   Review of Systems Review of Systems  HENT: Positive for nosebleeds.   All other systems reviewed and are negative.    Physical Exam Updated Vital Signs BP (!) 148/103 (BP Location: Left Arm)   Pulse (!) 111   Temp 98.1 F (36.7 C) (Oral)   Resp 20   SpO2 98%   Physical Exam Vitals signs and nursing note reviewed.    63 year old female, resting comfortably and in no acute distress. Vital signs are significant for elevated blood pressure and heart rate. Oxygen saturation is 98%, which is normal. Head is normocephalic and atraumatic. PERRLA, EOMI. Oropharynx is clear.  Bleeding site is identified on the right side of the nasal septum and Kiesselbach's plexus. Neck is nontender and supple without adenopathy or JVD. Back is nontender and there is no CVA tenderness. Lungs are clear without rales, wheezes, or rhonchi. Chest is nontender. Heart has regular rate and rhythm without murmur. Abdomen is soft, flat, nontender without masses or hepatosplenomegaly and peristalsis is normoactive. Extremities have no cyanosis or edema, full range of motion is present. Skin is warm and dry without rash. Neurologic: Mental status is normal, cranial nerves are intact, there are no motor or sensory deficits.  ED Treatments / Results   Procedures .Epistaxis Management  Date/Time: 09/07/2019 5:10 AM Performed by: Dione Booze, MD Authorized by: Dione Booze, MD   Consent:    Consent obtained:  Verbal   Consent given by:  Patient   Risks discussed:  Bleeding, pain, nasal injury and infection   Alternatives discussed:  Alternative treatment Anesthesia (see MAR for exact dosages):    Anesthesia method:  None Procedure details:    Treatment  site:  L anterior   Treatment method:  Anterior pack (Rapid Rhino 7.5 cm)   Treatment complexity:  Limited   Treatment episode: recurring   Post-procedure details:    Assessment:  Bleeding stopped   Patient tolerance of procedure:  Tolerated well, no immediate complications     Medications Ordered in ED Medications  tranexamic acid (CYKLOKAPRON) injection 500 mg (500 mg Topical Given 09/07/19 0515)  oxyCODONE-acetaminophen (PERCOCET/ROXICET) 5-325 MG per tablet 1 tablet (1 tablet Oral Given 09/07/19 0606)  Initial Impression / Assessment and Plan / ED Course  I have reviewed the triage vital signs and the nursing notes.  Left-sided epistaxis, anterior.  This is treated with topical tranexamic acid.  Old records are reviewed, and I can see no relevant past visits.  Unfortunately, with application of tranexamic acid, she would start to bleed from the right nostril.  Tranexamic acid was replaced with the same result.  It was felt that the bleeding site was too far posterior for topical tranexamic acid to reach it.  A 7.5 cm rapid Rhino was inserted and bleeding has stopped.  She was observed for 1 hour with no recurrence of bleeding.  She has hydrocodone-acetaminophen at home for chronic pain and she is advised that she may use this for pain from the rapid Rhino.  She is given a prescription for trimethoprim-sulfamethoxazole and is referred to ENT for follow-up.  Return precautions discussed.  Final Clinical Impressions(s) / ED Diagnoses   Final diagnoses:  Left-sided epistaxis    ED Discharge Orders         Ordered    sulfamethoxazole-trimethoprim (BACTRIM DS) 800-160 MG tablet  2 times daily     09/07/19 0651           Dione BoozeGlick, Macee Venables, MD 09/07/19 [phone]

## 2019-09-07 NOTE — ED Notes (Signed)
Bleeding has slowed down since she had her nose packed approx 30 minutes

## 2019-09-07 NOTE — ED Triage Notes (Signed)
Pt c/o nose bleed for t 6th times this week. States that she stuck a "tampon up her nose and now I am on the 2nd one". A&O x4

## 2019-09-07 NOTE — Discharge Instructions (Addendum)
Please leave the Rapid Rhino in place until you see the ENT physician.  You may take your hydrocodone-acetaminophen as needed for pain.  Return if you are having any problems.

## 2019-09-07 NOTE — ED Notes (Signed)
Nose bleeding

## 2019-09-09 ENCOUNTER — Telehealth: Payer: Self-pay | Admitting: *Deleted

## 2019-09-09 NOTE — Telephone Encounter (Signed)
Pt called regarding follow up provider information.  EDCM reviewed AVS to find pt is to follow up with Dr Wilburn Cornelia [phone].  Pt is very appreciative of information.

## 2019-10-04 ENCOUNTER — Other Ambulatory Visit: Payer: Self-pay | Admitting: Registered Nurse

## 2019-10-04 DIAGNOSIS — I10 Essential (primary) hypertension: Secondary | ICD-10-CM

## 2019-10-15 ENCOUNTER — Other Ambulatory Visit: Payer: Self-pay | Admitting: Registered Nurse

## 2019-10-15 DIAGNOSIS — F411 Generalized anxiety disorder: Secondary | ICD-10-CM

## 2019-12-31 ENCOUNTER — Other Ambulatory Visit: Payer: Self-pay | Admitting: Registered Nurse

## 2019-12-31 DIAGNOSIS — I10 Essential (primary) hypertension: Secondary | ICD-10-CM

## 2020-04-25 ENCOUNTER — Other Ambulatory Visit: Payer: Self-pay | Admitting: Registered Nurse

## 2020-04-25 DIAGNOSIS — I10 Essential (primary) hypertension: Secondary | ICD-10-CM

## 2020-04-28 NOTE — Telephone Encounter (Signed)
Please assist with f/u visit.   Pt was last seen 08/26/19 as telemed with Rich.   Will need new office visit to continue care about BP and refill meds. Once appointment is made we can send in cur[place]y refills.

## 2020-04-29 NOTE — Telephone Encounter (Signed)
Called pt LVMTCB on 04/29/20 for app schedule

## 2020-06-03 ENCOUNTER — Other Ambulatory Visit: Payer: Self-pay

## 2020-06-03 ENCOUNTER — Telehealth (INDEPENDENT_AMBULATORY_CARE_PROVIDER_SITE_OTHER): Payer: BC Managed Care – PPO | Admitting: Registered Nurse

## 2020-06-03 DIAGNOSIS — F5104 Psychophysiologic insomnia: Secondary | ICD-10-CM

## 2020-06-03 DIAGNOSIS — F411 Generalized anxiety disorder: Secondary | ICD-10-CM

## 2020-06-03 DIAGNOSIS — Z1211 Encounter for screening for malignant neoplasm of colon: Secondary | ICD-10-CM | POA: Diagnosis not present

## 2020-06-03 DIAGNOSIS — I10 Essential (primary) hypertension: Secondary | ICD-10-CM | POA: Diagnosis not present

## 2020-06-03 MED ORDER — TRAZODONE HCL 100 MG PO TABS: 100.0000 mg | ORAL_TABLET | Freq: Every day | ORAL | 1 refills | Status: DC

## 2020-06-03 MED ORDER — AMLODIPINE BESYLATE 5 MG PO TABS: 5.0000 mg | ORAL_TABLET | Freq: Every day | ORAL | 1 refills | Status: DC

## 2020-06-03 MED ORDER — DULOXETINE HCL 30 MG PO CPEP: 60.0000 mg | ORAL_CAPSULE | Freq: Every day | ORAL | 3 refills | Status: DC

## 2020-06-03 NOTE — Patient Instructions (Signed)
° ° ° °  If you have lab work done today you will be contacted with your lab results within the next 2 weeks.  If you have not heard from us then please contact us. The fastest way to get your results is to register for My Chart. ° ° °IF you received an x-ray today, you will receive an invoice from [facility]. Please contact [facility] at [phone] with questions or concerns regarding your invoice.  ° °IF you received labwork today, you will receive an invoice from [facility]. Please contact [facility] at [phone] with questions or concerns regarding your invoice.  ° °Our billing staff will not be able to assist you with questions regarding bills from these companies. ° °You will be contacted with the lab results as soon as they are available. The fastest way to get your results is to activate your My Chart account. Instructions are located on the last page of this paperwork. If you have not heard from us regarding the results in 2 weeks, please contact this office. °  ° ° ° °

## 2020-06-05 ENCOUNTER — Other Ambulatory Visit: Payer: Self-pay

## 2020-06-05 ENCOUNTER — Other Ambulatory Visit: Payer: Self-pay | Admitting: Registered Nurse

## 2020-06-05 NOTE — Telephone Encounter (Signed)
Patient is requesting a refill of the following medications: Requested Prescriptions   Pending Prescriptions Disp Refills  . phentermine (ADIPEX-P) 37.5 MG tablet 30 tablet 0    Sig: Take 1 tablet (37.5 mg total) by mouth daily before breakfast.    Date of patient request: 06/05/2020 Last office visit: 06/03/2020 Telemedicine Date of last refill: 09/24/2014 Last refill amount: 30 tablets  Follow up time period per chart: N/A

## 2020-06-05 NOTE — Telephone Encounter (Signed)
Copied from CRM [id number]. Topic: Quick Communication - Rx Refill/Question >> Jun 05, 2020 11:04 AM [name] wrote: Medication: phentermine (ADIPEX-P) 37.5 MG tablet  Has the patient contacted their pharmacy? No. (Agent: If no, request that the patient contact the pharmacy for the refill.) (Agent: If yes, when and what did the pharmacy advise?)  Preferred Pharmacy (with phone number or street name): [facility] [phone] [name], [name]  Phone:  [phone] Fax:  [phone]     Agent: Please be advised that RX refills may take up to 3 business days. We ask that you follow-up with your pharmacy.

## 2020-06-05 NOTE — Telephone Encounter (Signed)
Medication Request was sent to Janeece Agee @ 11:30

## 2020-06-08 MED ORDER — PHENTERMINE HCL 37.5 MG PO TABS: 37.5000 mg | ORAL_TABLET | Freq: Every day | ORAL | 0 refills | Status: DC

## 2020-06-09 ENCOUNTER — Other Ambulatory Visit: Payer: Self-pay

## 2020-06-09 ENCOUNTER — Telehealth: Payer: Self-pay | Admitting: Registered Nurse

## 2020-06-09 MED ORDER — PHENTERMINE HCL 37.5 MG PO TABS: 37.5000 mg | ORAL_TABLET | Freq: Every day | ORAL | 0 refills | Status: DC

## 2020-06-09 NOTE — Telephone Encounter (Signed)
What is the name of the medication? phentermine (ADIPEX-P) 37.5 MG tablet [[id number]]    Have you contacted your pharmacy to request a refill? We have this printed in the office.  Which pharmacy would you like this sent to? None we need to find copy and call pt to pick up.   Patient notified that their request is being sent to the clinical staff for review and that they should receive a call once it is complete. If they do not receive a call within 72 hours they can check with their pharmacy or our office.

## 2020-06-09 NOTE — Telephone Encounter (Signed)
LVM to let patient know that the prescription was ready for pick up.

## 2020-08-20 NOTE — Progress Notes (Signed)
Telemedicine Encounter- SOAP NOTE Established Patient  This telephone encounter was conducted with the patient's (or proxy's) verbal consent via audio telecommunications: yes  Patient was instructed to have this encounter in a suitably private space; and to only have persons present to whom they give permission to participate. In addition, patient identity was confirmed by use of name plus two identifiers (DOB and address).  I discussed the limitations, risks, security and privacy concerns of performing an evaluation and management service by telephone and the availability of in person appointments. I also discussed with the patient that there may be a patient responsible charge related to this service. The patient expressed understanding and agreed to proceed.  I spent a total of 14 minutes talking with the patient or their proxy.  Patient at home Provider in office  Chief Complaint  Patient presents with  . Medication Refill    patient needs medication refill for Amlodipine and also discuss other medications .    Subjective   [name] is a 64 y.o. established patient. Telephone visit today for medication refill  HTN: taking amlodipine 5mg  PO qd with good effect. No AEs. Denies CV symptoms  Anxiety: Taking duloxetine 60mg  PO qd with good effect. Wants to ensure medication safety. No AEs. Feeling well overall. No complaints. Taking trazodone 25-50mg  PO qhs PRN for sleep with good effect. No AEs. No grogginess on waking.   Due for colon CA screening - no symptoms, no fam hx of colon ca. Interested in cologuard.    HPI   Patient Active Problem List   Diagnosis Date Noted  . Pneumonia 06/10/2017  . CAP (community acquired pneumonia) 06/09/2017  . AKI (acute kidney injury) (HCC) 06/09/2017  . 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 Insomnia   . Cellulitis and abscess of leg 06/19/2016  . 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 Cellulitis of left lower extremity 01/09/2016  .  Psoriatic arthritis (HCC)   . Cough   . Wheezing   . Chronic anemia 12/05/2015  . Chronic venous insufficiency 12/04/2015  . Hoarding disorder 03/05/2015  . Alcohol abuse 11/21/2014  . Anxiety state 11/21/2014  . BMI 40.0-44.9, adult (HCC) 11/21/2014  . Epistaxis 09/24/2014  . Bruising 05/03/2014  . Hematemesis 03/07/2014  . Depressive disorder 12/09/2013  . GAD (generalized anxiety disorder) 09/17/2013  . Fibromyalgia 07/22/2013    Past Medical History:  Diagnosis Date  . Anemia   . Arthritis   . Depression   . Fibromyalgia   . MRSA (methicillin resistant Staphylococcus aureus)   . Obese   . Pneumonia   . Seasonal allergies   . Sleep apnea 2012   took test in [place]-told to use cpap-could not ude    Current Outpatient Medications  Medication Sig Dispense Refill  . acyclovir ointment (ZOVIRAX) 5 % Apply topically every 3 (three) hours. 5 g 0  . amLODipine (NORVASC) 5 MG tablet Take 1 tablet (5 mg total) by mouth daily. 90 tablet 1  . cyclobenzaprine (FLEXERIL) 10 MG tablet     . DULoxetine (CYMBALTA) 30 MG capsule Take 2 capsules (60 mg total) by mouth daily. 180 capsule 3  . EPINEPHrine 0.3 mg/0.3 mL IJ SOAJ injection Inject 0.3 mLs (0.3 mg total) into the muscle once. (Patient taking differently: Inject 0.3 mg into the muscle daily as needed (allergic reaction). ) 1 Device 2  . HYDROcodone-acetaminophen (NORCO) 7.5-325 MG tablet     . SYMPROIC 0.2 MG TABS Take 1 tablet by mouth daily.    2013  phentermine (ADIPEX-P) 37.5 MG tablet Take 1 tablet (37.5 mg total) by mouth daily before breakfast. 30 tablet 0  . traZODone (DESYREL) 100 MG tablet Take 1 tablet (100 mg total) by mouth at bedtime. 180 tablet 1   No current facility-administered medications for this visit.    No Known Allergies  Social History   Socioeconomic History  . Marital status: Married    Spouse name: Not on file  . Number of children: Not on file  . Years of education: Not on file  . Highest  education level: Not on file  Occupational History  . Not on file  Tobacco Use  . Smoking status: Former Smoker    Quit date: 05/16/1981    Years since quitting: 39.2  . Smokeless tobacco: Never Used  Vaping Use  . Vaping Use: Never used  Substance and Sexual Activity  . Alcohol use: Yes    Alcohol/week: 60.0 standard drinks    Types: 60 Cans of beer per week    Comment: 10 beers / day on average, about 6 days each week  . Drug use: No  . Sexual activity: Yes    Birth control/protection: None, Post-menopausal    Comment: INTERCOURSE AGE 89, SEXUAL PARTNERS MORE THAN 5  Other Topics Concern  . Not on file  Social History Narrative   Pt lives at home with husband, retired IT sales professional. They are caretakers to a 63 yo nonverbal autistic woman.    Social Determinants of Health   Financial Resource Strain:   . Difficulty of Paying Living Expenses: Not on file  Food Insecurity:   . Worried About Programme researcher, broadcasting/film/video in the Last Year: Not on file  . Ran Out of Food in the Last Year: Not on file  Transportation Needs:   . Lack of Transportation (Medical): Not on file  . Lack of Transportation (Non-Medical): Not on file  Physical Activity:   . Days of Exercise per Week: Not on file  . Minutes of Exercise per Session: Not on file  Stress:   . Feeling of Stress : Not on file  Social Connections:   . Frequency of Communication with Friends and Family: Not on file  . Frequency of Social Gatherings with Friends and Family: Not on file  . Attends Religious Services: Not on file  . Active Member of Clubs or Organizations: Not on file  . Attends Banker Meetings: Not on file  . Marital Status: Not on file  Intimate Partner Violence:   . Fear of Current or Ex-Partner: Not on file  . Emotionally Abused: Not on file  . Physically Abused: Not on file  . Sexually Abused: Not on file    ROS Negative on 13 pt ros Objective   Vitals as reported by the patient: There were no  vitals filed for this visit.  Sabre was seen today for medication refill.  Diagnoses and all orders for this visit:  Essential hypertension -     amLODipine (NORVASC) 5 MG tablet; Take 1 tablet (5 mg total) by mouth daily.  Colon cancer screening -     Cologuard  Anxiety state -     DULoxetine (CYMBALTA) 30 MG capsule; Take 2 capsules (60 mg total) by mouth daily.  Psychophysiologic insomnia -     traZODone (DESYREL) 100 MG tablet; Take 1 tablet (100 mg total) by mouth at bedtime.   PLAN  Refill medications x 6 mo  Return at that time for in person OV  and labs  Patient encouraged to call clinic with any questions, comments, or concerns.  I discussed the assessment and treatment plan with the patient. The patient was provided an opportunity to ask questions and all were answered. The patient agreed with the plan and demonstrated an understanding of the instructions.   The patient was advised to call back or seek an in-person evaluation if the symptoms worsen or if the condition fails to improve as anticipated.  I provided 14 minutes of non-face-to-face time during this encounter.  Janeece Agee, NP  Primary Care at [facility]

## 2020-08-25 ENCOUNTER — Encounter: Payer: Self-pay | Admitting: Nurse Practitioner

## 2020-08-26 ENCOUNTER — Other Ambulatory Visit: Payer: Self-pay | Admitting: Registered Nurse

## 2020-08-26 ENCOUNTER — Telehealth: Payer: Self-pay | Admitting: Registered Nurse

## 2020-08-26 NOTE — Telephone Encounter (Signed)
Patient is needing to know if Provider will write a prescription for Tretinoin generic wrinkle cream     Please advise

## 2020-08-26 NOTE — Telephone Encounter (Signed)
Pt was last seen on 06/03/20.  Pt is requesting a rx for wrinkle cream. Is that appropriate?  I called pt to gather more info no answer. I left a message to call back.

## 2020-08-26 NOTE — Telephone Encounter (Signed)
Requested medication (s) are due for refill today: yes  Requested medication (s) are on the active medication list: yes  Last refill:  06/08/20  Future visit scheduled: no  Notes to clinic:  not delegated   Requested Prescriptions  Pending Prescriptions Disp Refills   phentermine (ADIPEX-P) 37.5 MG tablet [Pharmacy Med Name: PHENTERMINE 37.5 MG TABLET] 30 tablet 0    Sig: TAKE 1 TABLET BY MOUTH EVERY DAY AROUND BREAKFAST TIME      Not Delegated - Gastroenterology:  Antiobesity Agents Failed - 08/26/2020 10:31 AM      Failed - This refill cannot be delegated      Failed - Last BP in normal range    BP Readings from Last 1 Encounters:  09/07/19 (!) 158/79          Passed - Last Heart Rate in normal range    Pulse Readings from Last 1 Encounters:  09/07/19 88          Passed - Valid encounter within last 12 months    Recent Outpatient Visits           2 months ago Essential hypertension   [facility], Richard, NP   1 year ago Tension-type headache, not intractable, unspecified chronicity pattern   [facility], Gerlene Burdock, NP   1 year ago Screening for viral disease   [facility], Gerlene Burdock, NP   1 year ago Acute kidney injury [facility])   [facility], Manus Rudd, MD   1 year ago Encounter for medical examination to establish care   [facility], Gerlene Burdock, NP

## 2020-08-26 NOTE — Telephone Encounter (Signed)
Pt has not been seen since 03/20/2019 and will need to be see in order to refill medications.

## 2020-08-26 NOTE — Telephone Encounter (Signed)
Pt is calling to let richard morrow know she is taking phentermine and has lost 24 pounds so far.

## 2020-08-27 NOTE — Telephone Encounter (Signed)
Pt FYI:  Started phentermine and has lost 24 lb thus far.

## 2020-08-27 NOTE — Telephone Encounter (Signed)
08/27/2020 - PATIENT REQUESTING A REFILL ON HER PHENTERMINE 37.5 mg. I HAVE SCHEDULED AND OFFICE VISIT WITH RICH MORROW FOR Monday (08/31/2020) AT 2:30 pm. I WILL NOT ROUTE BACK TO THE CLINICAL TEAM AT THIS TIME SINCE FELICIA K. SAID NO REFILLS WILL BE GIVEN UNTIL PATIENT COMES INTO THE OFFICE. MBC

## 2020-08-31 ENCOUNTER — Other Ambulatory Visit: Payer: Self-pay

## 2020-08-31 ENCOUNTER — Ambulatory Visit: Payer: BC Managed Care – PPO | Admitting: Registered Nurse

## 2020-08-31 ENCOUNTER — Encounter: Payer: Self-pay | Admitting: Registered Nurse

## 2020-08-31 DIAGNOSIS — Z6841 Body Mass Index (BMI) 40.0 and over, adult: Secondary | ICD-10-CM | POA: Diagnosis not present

## 2020-08-31 MED ORDER — PHENTERMINE HCL 37.5 MG PO TABS: 37.5000 mg | ORAL_TABLET | Freq: Every day | ORAL | 0 refills | Status: DC

## 2020-08-31 NOTE — Progress Notes (Signed)
Established Patient Office Visit  Subjective:  Patient ID: [name], female    DOB: 05/17/1956  Age: 64 y.o. MRN: [id number]  CC:  Chief Complaint  Patient presents with   Medication Refill    patient states she is here for medication refill.     HPI [name] presents for med refill  Obesity: pt has struggled with her weight her whole life. Started phentermine 37.5mg  PO qd, sometimes half tab daily, in August. Since then has lost 30 lbs through diet and exercise. Feeling very positive about this change - she is ecstatic. Hoping to continue. No AEs. Sleeping well. Taking trazodone 3-5 nights each week.  No other concerns. Feeling well.   Past Medical History:  Diagnosis Date   Anemia    Arthritis    Depression    Fibromyalgia    MRSA (methicillin resistant Staphylococcus aureus)    Obese    Pneumonia    Seasonal allergies    Sleep apnea 2012   took test in [place]-told to use cpap-could not ude    Past Surgical History:  Procedure Laterality Date   ANTERIOR CERVICAL DECOMP/DISCECTOMY FUSION  05/09/2008   C5-6, C6-7; with arthrodesis also   GASTRIC BYPASS  2002   KNEE ARTHROSCOPY  2004   rt and lt knee   ROTATOR CUFF REPAIR W/ DISTAL CLAVICLE EXCISION  01/06/2006   right   SHOULDER ARTHROSCOPY  2003   rt   SHOULDER ARTHROSCOPY  2003   rt   TONSILLECTOMY     TOTAL KNEE ARTHROPLASTY  03/29/2004   left   TOTAL KNEE ARTHROPLASTY  11/07/2003   right   TOTAL KNEE REVISION  07/30/2010   right; with lateral release    Family History  Problem Relation Age of Onset   Heart disease Mother    Cancer Father    Heart disease Maternal Grandfather    Alcoholism Maternal Grandfather    Heart attack Brother     Social History   Socioeconomic History   Marital status: Married    Spouse name: Not on file   Number of children: Not on file   Years of education: Not on file   Highest education level: Not on file  Occupational History    Not on file  Tobacco Use   Smoking status: Former Smoker    Quit date: 05/16/1981    Years since quitting: 39.3   Smokeless tobacco: Never Used  Vaping Use   Vaping Use: Never used  Substance and Sexual Activity   Alcohol use: Yes    Alcohol/week: 60.0 standard drinks    Types: 60 Cans of beer per week    Comment: 10 beers / day on average, about 6 days each week   Drug use: No   Sexual activity: Yes    Birth control/protection: None, Post-menopausal    Comment: INTERCOURSE AGE 84, SEXUAL PARTNERS MORE THAN 5  Other Topics Concern   Not on file  Social History Narrative   Pt lives at home with husband, retired IT sales professional. They are caretakers to a 63 yo nonverbal autistic woman.    Social Determinants of Health   Financial Resource Strain:    Difficulty of Paying Living Expenses: Not on file  Food Insecurity:    Worried About Programme researcher, broadcasting/film/video in the Last Year: Not on file   [employer] of Food in the Last Year: Not on file  Transportation Needs:    Lack of Transportation (Medical): Not on  file   Lack of Transportation (Non-Medical): Not on file  Physical Activity:    Days of Exercise per Week: Not on file   Minutes of Exercise per Session: Not on file  Stress:    Feeling of Stress : Not on file  Social Connections:    Frequency of Communication with Friends and Family: Not on file   Frequency of Social Gatherings with Friends and Family: Not on file   Attends Religious Services: Not on file   Active Member of Clubs or Organizations: Not on file   Attends Banker Meetings: Not on file   Marital Status: Not on file  Intimate Partner Violence:    Fear of Current or Ex-Partner: Not on file   Emotionally Abused: Not on file   Physically Abused: Not on file   Sexually Abused: Not on file    Outpatient Medications Prior to Visit  Medication Sig Dispense Refill   acyclovir ointment (ZOVIRAX) 5 % Apply topically every 3 (three)  hours. 5 g 0   amLODipine (NORVASC) 5 MG tablet Take 1 tablet (5 mg total) by mouth daily. 90 tablet 1   DULoxetine (CYMBALTA) 30 MG capsule Take 2 capsules (60 mg total) by mouth daily. 180 capsule 3   EPINEPHrine 0.3 mg/0.3 mL IJ SOAJ injection Inject 0.3 mLs (0.3 mg total) into the muscle once. 1 Device 2   SYMPROIC 0.2 MG TABS Take 1 tablet by mouth daily.     traZODone (DESYREL) 100 MG tablet Take 1 tablet (100 mg total) by mouth at bedtime. 180 tablet 1   phentermine (ADIPEX-P) 37.5 MG tablet Take 1 tablet (37.5 mg total) by mouth daily before breakfast. 30 tablet 0   cyclobenzaprine (FLEXERIL) 10 MG tablet  (Patient not taking: Reported on 08/31/2020)     HYDROcodone-acetaminophen (NORCO) 7.5-325 MG tablet  (Patient not taking: Reported on 08/31/2020)     No facility-administered medications prior to visit.    No Known Allergies  ROS Review of Systems  Constitutional: Negative.   HENT: Negative.   Eyes: Negative.   Respiratory: Negative.   Cardiovascular: Negative.   Gastrointestinal: Negative.   Genitourinary: Negative.   Musculoskeletal: Negative.   Skin: Negative.   Neurological: Negative.   Psychiatric/Behavioral: Negative.       Objective:    Physical Exam Vitals and nursing note reviewed.  Constitutional:      General: She is not in acute distress.    Appearance: Normal appearance. She is normal weight. She is not ill-appearing, toxic-appearing or diaphoretic.  Cardiovascular:     Rate and Rhythm: Normal rate and regular rhythm.     Heart sounds: Normal heart sounds. No murmur heard.  No friction rub. No gallop.   Pulmonary:     Effort: Pulmonary effort is normal. No respiratory distress.     Breath sounds: Normal breath sounds. No stridor. No wheezing, rhonchi or rales.  Chest:     Chest wall: No tenderness.  Skin:    General: Skin is warm and dry.  Neurological:     General: No focal deficit present.     Mental Status: She is alert and oriented  to person, place, and time. Mental status is at baseline.  Psychiatric:        Mood and Affect: Mood normal.        Behavior: Behavior normal.        Thought Content: Thought content normal.        Judgment: Judgment normal.  BP (!) 147/83    Pulse 91    Temp 98.3 F (36.8 C) (Temporal)    Resp 18    Ht 5\' 7"  (1.702 m)    Wt 271 lb 9.6 oz (123.2 kg)    SpO2 96%    BMI 42.54 kg/m  Wt Readings from Last 3 Encounters:  08/31/20 271 lb 9.6 oz (123.2 kg)  03/20/19 295 lb (133.8 kg)  02/13/19 300 lb (136.1 kg)     There are no preventive care reminders to display for this patient.  There are no preventive care reminders to display for this patient.  No results found for: TSH Lab Results  Component Value Date   WBC 5.7 02/14/2019   HGB 12.0 02/14/2019   HCT 36.0 02/14/2019   MCV 96 02/14/2019   PLT 269 02/14/2019   Lab Results  Component Value Date   NA 141 02/14/2019   K 4.3 02/14/2019   CO2 21 02/14/2019   GLUCOSE 159 (H) 02/14/2019   BUN 13 02/14/2019   CREATININE 0.92 02/14/2019   BILITOT 0.5 06/08/2017   ALKPHOS 86 06/08/2017   AST 40 06/08/2017   ALT 22 06/08/2017   PROT 6.7 06/08/2017   ALBUMIN 3.6 06/08/2017   CALCIUM 9.1 02/14/2019   ANIONGAP 12 06/13/2017   Lab Results  Component Value Date   CHOL 152 02/14/2019   Lab Results  Component Value Date   HDL 72 02/14/2019   Lab Results  Component Value Date   LDLCALC 65 02/14/2019   Lab Results  Component Value Date   TRIG 76 02/14/2019   Lab Results  Component Value Date   CHOLHDL 2.1 02/14/2019   Lab Results  Component Value Date   HGBA1C 5.2 02/14/2019      Assessment & Plan:   Problem List Items Addressed This Visit    None    Visit Diagnoses    Class 3 severe obesity due to excess calories without serious comorbidity with body mass index (BMI) of 45.0 to 49.9 in adult [facility])    -  Primary   Relevant Medications   phentermine (ADIPEX-P) 37.5 MG tablet      Meds ordered this  encounter  Medications   phentermine (ADIPEX-P) 37.5 MG tablet    Sig: Take 1 tablet (37.5 mg total) by mouth daily before breakfast.    Dispense:  30 tablet    Refill:  0    Order Specific Question:   Supervising Provider    Answer:   [facility] JEFFREY R [[id number]]    Follow-up: No follow-ups on file.   PLAN  Pt has made huge strides in weight loss - hoping to sustain this momentum  Will refill  Discussed that this medication is ideally a short term med - may take a break after this refill, but her intermittent and half dose use is reassuring  Patient encouraged to call clinic with any questions, comments, or concerns.  Neva Seat, NP

## 2020-08-31 NOTE — Patient Instructions (Signed)
° ° ° °  If you have lab work done today you will be contacted with your lab results within the next 2 weeks.  If you have not heard from us then please contact us. The fastest way to get your results is to register for My Chart. ° ° °IF you received an x-ray today, you will receive an invoice from [facility]. Please contact [facility] at [phone] with questions or concerns regarding your invoice.  ° °IF you received labwork today, you will receive an invoice from [facility]. Please contact [facility] at [phone] with questions or concerns regarding your invoice.  ° °Our billing staff will not be able to assist you with questions regarding bills from these companies. ° °You will be contacted with the lab results as soon as they are available. The fastest way to get your results is to activate your My Chart account. Instructions are located on the last page of this paperwork. If you have not heard from us regarding the results in 2 weeks, please contact this office. °  ° ° ° °

## 2020-09-03 ENCOUNTER — Telehealth: Payer: Self-pay | Admitting: Registered Nurse

## 2020-09-03 ENCOUNTER — Other Ambulatory Visit: Payer: Self-pay | Admitting: Registered Nurse

## 2020-09-03 ENCOUNTER — Other Ambulatory Visit: Payer: Self-pay

## 2020-09-03 DIAGNOSIS — Z6841 Body Mass Index (BMI) 40.0 and over, adult: Secondary | ICD-10-CM

## 2020-09-03 DIAGNOSIS — I10 Essential (primary) hypertension: Secondary | ICD-10-CM

## 2020-09-03 MED ORDER — PHENTERMINE HCL 37.5 MG PO TABS: 37.5000 mg | ORAL_TABLET | Freq: Every day | ORAL | 0 refills | Status: DC

## 2020-09-03 MED ORDER — AMLODIPINE BESYLATE 5 MG PO TABS: 5.0000 mg | ORAL_TABLET | Freq: Every day | ORAL | 1 refills | Status: DC

## 2020-09-03 NOTE — Telephone Encounter (Signed)
Pt saw provider on monday 08/31/20 and med refill. Pharmacy styeem was down and med refill did not go threw. Pt is needing these Rx listed below resent in. Please advise.  amLODipine (NORVASC) 5 MG tablet [[id number]]  phentermine (ADIPEX-P) 37.5 MG tablet [[id number]]   and she wanted to add a redinade generic Rx for some acne but mostly wrinkles.

## 2020-09-03 NOTE — Telephone Encounter (Signed)
Refills re sent, new rx will not be given with out appointment addressing said issue.

## 2020-09-04 ENCOUNTER — Other Ambulatory Visit: Payer: Self-pay | Admitting: Registered Nurse

## 2020-09-04 ENCOUNTER — Telehealth: Payer: Self-pay

## 2020-09-04 DIAGNOSIS — L988 Other specified disorders of the skin and subcutaneous tissue: Secondary | ICD-10-CM

## 2020-09-04 DIAGNOSIS — I10 Essential (primary) hypertension: Secondary | ICD-10-CM

## 2020-09-04 NOTE — Telephone Encounter (Signed)
Copied from CRM [id number]. Topic: General - Call Back - No Documentation >> Sep 04, 2020  3:45 PM Randol Kern wrote: Reason for CRM: Pt has a couple of questions for [facility] [phone]. Declined to disclose any more

## 2020-09-04 NOTE — Telephone Encounter (Signed)
[name] is a calling back needs a phone before we close today

## 2020-09-08 NOTE — Telephone Encounter (Signed)
Patient would like to know if she could get the prescription for wrinkles.

## 2020-09-08 NOTE — Telephone Encounter (Signed)
If we could put in a referral to dermatology - often a botox injection is most helpful and lasts longest. They can also review phototherapies and other Rxs. I do not prescribe for wrinkles.  Thank you  Jari Sportsman, NP

## 2020-09-14 NOTE — Telephone Encounter (Signed)
Referral has been sent.

## 2021-05-12 ENCOUNTER — Other Ambulatory Visit (HOSPITAL_COMMUNITY)
Admission: RE | Admit: 2021-05-12 | Discharge: 2021-05-12 | Disposition: A | Discharge status: Home / Self Care | Payer: BC Managed Care – PPO | Source: Ambulatory Visit | Attending: Nurse Practitioner | Admitting: Nurse Practitioner

## 2021-05-12 ENCOUNTER — Encounter: Payer: Self-pay | Admitting: Nurse Practitioner

## 2021-05-12 ENCOUNTER — Ambulatory Visit (INDEPENDENT_AMBULATORY_CARE_PROVIDER_SITE_OTHER): Payer: BC Managed Care – PPO | Admitting: Nurse Practitioner

## 2021-05-12 ENCOUNTER — Other Ambulatory Visit: Payer: Self-pay

## 2021-05-12 VITALS — BP 126/84 | Ht 66.0 in | Wt 276.0 lb

## 2021-05-12 DIAGNOSIS — Z780 Asymptomatic menopausal state: Secondary | ICD-10-CM

## 2021-05-12 DIAGNOSIS — N898 Other specified noninflammatory disorders of vagina: Secondary | ICD-10-CM

## 2021-05-12 DIAGNOSIS — Z01419 Encounter for gynecological examination (general) (routine) without abnormal findings: Secondary | ICD-10-CM

## 2021-05-12 DIAGNOSIS — Z13820 Encounter for screening for osteoporosis: Secondary | ICD-10-CM

## 2021-05-12 DIAGNOSIS — N3001 Acute cystitis with hematuria: Secondary | ICD-10-CM | POA: Diagnosis not present

## 2021-05-12 DIAGNOSIS — N760 Acute vaginitis: Secondary | ICD-10-CM

## 2021-05-12 DIAGNOSIS — B9689 Other specified bacterial agents as the cause of diseases classified elsewhere: Secondary | ICD-10-CM

## 2021-05-12 DIAGNOSIS — R8290 Unspecified abnormal findings in urine: Secondary | ICD-10-CM

## 2021-05-12 LAB — WET PREP FOR TRICH, YEAST, CLUE

## 2021-05-12 MED ORDER — METRONIDAZOLE 500 MG PO TABS: 500.0000 mg | ORAL_TABLET | Freq: Two times a day (BID) | ORAL | 0 refills | Status: DC

## 2021-05-12 MED ORDER — SULFAMETHOXAZOLE-TRIMETHOPRIM 800-160 MG PO TABS: 1.0000 | ORAL_TABLET | Freq: Two times a day (BID) | ORAL | 0 refills | Status: AC

## 2021-05-12 NOTE — Progress Notes (Signed)
[name] 1955/11/22 [id number]   History:  65 y.o. G0 presents for annual exam. Complains of vaginal itching and cloudy urine. She had intercourse a few weeks ago and symptoms started after. She also spends a lot of time in a wet bathing suit. Postmenopausal - no HRT, no bleeding. Normal pap and mammogram history. HTN, anxiety managed by PCP. History of gastric bypass surgery 2002.   Gynecologic History No LMP recorded. Patient is postmenopausal.   Contraception/Family planning: post menopausal status Sexually active: Yes  Health Maintenance Last Pap: 11/16/2016. Results were: ASCUS negative HPV Last mammogram: 08/25/2020. Results were: Normal Last colonoscopy: Never Last Dexa: 06/02/2015. Results were: Normal  Past medical history, past surgical history, family history and social history were all reviewed and documented in the EPIC chart. Married. 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 Guardian of 64 yo with autism.   ROS:  A ROS was performed and pertinent positives and negatives are included.  Exam:  Vitals:   05/12/21 1149  BP: 126/84  Weight: 276 lb (125.2 kg)  Height: 5\' 6"  (1.676 m)   Body mass index is 44.55 kg/m.  General appearance:  Normal Thyroid:  Symmetrical, normal in size, without palpable masses or nodularity. Respiratory  Auscultation:  Clear without wheezing or rhonchi Cardiovascular  Auscultation:  Regular rate, without rubs, murmurs or gallops  Edema/varicosities:  Not grossly evident Abdominal  Soft,nontender, without masses, guarding or rebound.  Liver/spleen:  No organomegaly noted  Hernia:  None appreciated  Skin  Inspection:  Grossly normal Breasts: Examined lying and sitting.   Right: Without masses, retractions, nipple discharge or axillary adenopathy.   Left: Without masses, retractions, nipple discharge or axillary adenopathy. Genitourinary   Inguinal/mons:  Normal without inguinal adenopathy  External genitalia:  Normal  appearing vulva with no masses, tenderness, or lesions  BUS/Urethra/Skene's glands:  Normal  Vagina:  Normal appearing with normal color and discharge, no lesions  Cervix:  Normal appearing without discharge or lesions  Uterus:  Normal in size, shape and contour.  Midline and mobile, nontender  Adnexa/parametria:     Rt: Normal in size, without masses or tenderness.   Lt: Normal in size, without masses or tenderness.  Anus and perineum: Normal  Digital rectal exam: Normal sphincter tone without palpated masses or tenderness  Wet prep + clue cells UA: leukocytes 2+, rbc 1+, nitrate negative, protein 1+, SG 1.020, yellow/cloudy. Microscopic: wbc >60, rbc 10-20, moderate bacteria  Patient informed chaperone available to be present for breast and pelvic exam. Patient has requested no chaperone to be present. Patient has been advised what will be completed during breast and pelvic exam.   Assessment/Plan:  65 y.o. G0 for annual exam.   Well female exam with routine gynecological exam - Plan: Cytology - PAP( [facility]). Education provided on SBEs, importance of preventative screenings, current guidelines, high calcium diet, regular exercise, and multivitamin daily. Labs with PCP.   Postmenopausal - Plan: DG Bone Density. No HRT, no bleeding.   Vaginal itching - Plan: WET PREP FOR TRICH, YEAST, CLUE. Wet prep positive for clue cells.  Cloudy urine - Plan: Urinalysis w microscopic + reflex culture  Bacterial vaginosis - Plan: metroNIDAZOLE (FLAGYL) 500 MG tablet twice daily x 7 days.   Acute cystitis with hematuria - Plan: sulfamethoxazole-trimethoprim (BACTRIM DS) 800-160 MG tablet twice daily x 3 days. Urine culture pending.   Screening for cervical cancer - Normal Pap history. Pap with HR HPV today.   Screening for breast  cancer - Normal mammogram history.  Continue annual screenings.  Normal breast exam today.  Screening for colon cancer - Has not had screening colonoscopy. Discussed  current guidelines and importance of preventative screenings. Information provided on [facility].   Screening for osteoporosis - Plan: DG Bone Density. Normal DXA in 2016.  Return in 1 year for annual.   Olivia Mackie DNP, 12:25 PM 05/12/2021

## 2021-05-12 NOTE — Patient Instructions (Signed)
[facility] [phone] [address] [place], [place] [address]  

## 2021-05-13 ENCOUNTER — Encounter: Payer: Self-pay | Admitting: Gastroenterology

## 2021-05-13 LAB — CYTOLOGY - PAP
Comment: NEGATIVE
Diagnosis: NEGATIVE
High risk HPV: POSITIVE — AB

## 2021-05-15 LAB — URINALYSIS W MICROSCOPIC + REFLEX CULTURE
Bilirubin Urine: NEGATIVE
Casts: NONE SEEN /LPF
Crystals: NONE SEEN /HPF
Glucose, UA: NEGATIVE
Nitrites, Initial: NEGATIVE
Specific Gravity, Urine: 1.02 (ref 1.001–1.035)
WBC, UA: >=60 /HPF — AB (ref 0–5)
Yeast: NONE SEEN /HPF
pH: 5.5 (ref 5.0–8.0)

## 2021-05-15 LAB — URINE CULTURE
MICRO NUMBER:: 12141666
SPECIMEN QUALITY:: ADEQUATE

## 2021-05-15 LAB — CULTURE INDICATED

## 2021-05-17 ENCOUNTER — Other Ambulatory Visit: Payer: Self-pay | Admitting: Nurse Practitioner

## 2021-05-17 DIAGNOSIS — B9620 Unspecified Escherichia coli [E. coli] as the cause of diseases classified elsewhere: Secondary | ICD-10-CM

## 2021-05-17 DIAGNOSIS — N390 Urinary tract infection, site not specified: Secondary | ICD-10-CM

## 2021-05-17 MED ORDER — NITROFURANTOIN MONOHYD MACRO 100 MG PO CAPS: 100.0000 mg | ORAL_CAPSULE | Freq: Two times a day (BID) | ORAL | 0 refills | Status: DC

## 2021-05-25 ENCOUNTER — Ambulatory Visit (INDEPENDENT_AMBULATORY_CARE_PROVIDER_SITE_OTHER): Payer: BC Managed Care – PPO

## 2021-05-25 ENCOUNTER — Other Ambulatory Visit: Payer: Self-pay

## 2021-05-25 ENCOUNTER — Other Ambulatory Visit: Payer: Self-pay | Admitting: Nurse Practitioner

## 2021-05-25 DIAGNOSIS — Z780 Asymptomatic menopausal state: Secondary | ICD-10-CM

## 2021-05-25 DIAGNOSIS — Z13820 Encounter for screening for osteoporosis: Secondary | ICD-10-CM

## 2021-06-03 ENCOUNTER — Other Ambulatory Visit: Payer: Self-pay | Admitting: Registered Nurse

## 2021-06-03 DIAGNOSIS — I10 Essential (primary) hypertension: Secondary | ICD-10-CM

## 2021-06-21 ENCOUNTER — Ambulatory Visit: Payer: BC Managed Care – PPO | Admitting: Gastroenterology

## 2021-06-21 ENCOUNTER — Encounter: Payer: Self-pay | Admitting: Gastroenterology

## 2021-06-21 VITALS — BP 134/80 | HR 80 | Ht 65.25 in | Wt 280.0 lb

## 2021-06-21 DIAGNOSIS — Z1211 Encounter for screening for malignant neoplasm of colon: Secondary | ICD-10-CM | POA: Diagnosis not present

## 2021-06-21 DIAGNOSIS — K5904 Chronic idiopathic constipation: Secondary | ICD-10-CM | POA: Diagnosis not present

## 2021-06-21 DIAGNOSIS — Z1212 Encounter for screening for malignant neoplasm of rectum: Secondary | ICD-10-CM

## 2021-06-21 MED ORDER — PLENVU 140 G PO SOLR: ORAL | 0 refills | Status: DC

## 2021-06-21 NOTE — Patient Instructions (Addendum)
If you are age 64 or older, your body mass index should be between 23-30. Your Body mass index is 46.24 kg/m. If this is out of the aforementioned range listed, please consider follow up with your Primary Care Provider.  If you are age 8 or younger, your body mass index should be between 19-25. Your Body mass index is 46.24 kg/m. If this is out of the aformentioned range listed, please consider follow up with your Primary Care Provider.   Start Miralax daily.  You have been scheduled for a colonoscopy. Please follow written instructions given to you at your visit today.  Please pick up your prep supplies at the pharmacy within the next 1-3 days. If you use inhalers (even only as needed), please bring them with you on the day of your procedure.   The [facility] providers would like to encourage you to use [facility] to communicate with providers for non-urgent requests or questions.  Due to long hold times on the telephone, sending your provider a message by [facility] may be a faster and more efficient way to get a response.  Please allow 48 business hours for a response.  Please remember that this is for non-urgent requests.   It was a pleasure to see you today!  Thank you for trusting me with your gastrointestinal care!    Scott E. Tomasa Rand, MD

## 2021-06-22 NOTE — Progress Notes (Signed)
HPI : [name] is a very pleasant 65 year old female who was referred to [place] by Wyline Beady, NP for initial screening colonoscopy.  The patient has never had a colonoscopy or any other colon cancer screening test.  She denies any family history of colon cancer.  She does have chronic constipation which has been lifelong for her.  She typically only has a bowel movement once a week.  Her stools are usually hard and difficult to pass.  She has hemorrhoids as a result, which sometimes bleed and cause perianal discomfort.  She has taken Metamucil in the past, which did help with her stool habits, but caused very bothersome flatulence.  Otherwise, she denies any blood in her stool.  No problems with diarrhea or significant abdominal pain.  She denies frequent upper GI symptoms such as heartburn, acid regurgitation, nausea/vomiting or dysphagia.  No unintentional weight loss. She has no known significant cardiopulmonary comorbidities and denies symptoms of recurrent chest pain/pressure, shortness of breath, orthopnea She has chronic pain related to osteoarthritis and psoriatic arthritis and takes narcotics for this.   Past Medical History:  Diagnosis Date   Anemia    Arthritis    Depression    Fibromyalgia    HTN (hypertension)    MRSA (methicillin resistant Staphylococcus aureus)    Obese    Plantar fasciitis    Pneumonia    Seasonal allergies    Sleep apnea 2012   took test in [place]-told to use cpap-could not ude     Past Surgical History:  Procedure Laterality Date   ANTERIOR CERVICAL DECOMP/DISCECTOMY FUSION  05/09/2008   C5-6, C6-7; with arthrodesis also   GASTRIC BYPASS  10/24/2000   KNEE ARTHROSCOPY  10/24/2002   rt and lt knee   ROTATOR CUFF REPAIR W/ DISTAL CLAVICLE EXCISION  01/06/2006   right   SHOULDER ARTHROSCOPY Left 10/24/2001   TONSILLECTOMY     TOTAL KNEE ARTHROPLASTY  03/29/2004   left   TOTAL KNEE ARTHROPLASTY  11/07/2003   right   TOTAL KNEE REVISION   07/30/2010   right; with lateral release   Family History  Problem Relation Age of Onset   Heart disease Mother    COPD Mother        smoker   Bladder Cancer Father    Heart attack Brother    Heart disease Maternal Grandfather    Alcoholism Maternal Grandfather    Diabetes Paternal Grandfather    Social History   Tobacco Use   Smoking status: Former    Types: Cigarettes    Quit date: 05/16/1981    Years since quitting: 40.1   Smokeless tobacco: Never  Vaping Use   Vaping Use: Never used  Substance Use Topics   Alcohol use: Yes    Alcohol/week: 28.0 standard drinks    Types: 28 Cans of beer per week    Comment: 2-4 per day   Drug use: No   Current Outpatient Medications  Medication Sig Dispense Refill   amLODipine (NORVASC) 5 MG tablet TAKE 1 TABLET BY MOUTH EVERY DAY 30 tablet 0   DULoxetine (CYMBALTA) 60 MG capsule Take 1 capsule by mouth daily.     HYDROcodone-acetaminophen (NORCO) 10-325 MG tablet Take 1 tablet by mouth 2 (two) times daily as needed.     metaxalone (SKELAXIN) 800 MG tablet Take 800 mg by mouth 3 (three) times daily.     morphine (MS CONTIN) 30 MG 12 hr tablet SMARTSIG:1 Tablet(s) By Mouth Every 12 Hours  PEG-KCl-NaCl-NaSulf-Na Asc-C (PLENVU) 140 g SOLR Use as directed. Manufacturer's coupon Universal coupon code:BIN: G6837245; GROUP: [id number]; PCN: [id number]; ID: [id number]; PAY NO MORE $50; NO prior authorization 1 each 0   SYMPROIC 0.2 MG TABS Take 1 tablet by mouth daily.     traZODone (DESYREL) 100 MG tablet Take 1 tablet (100 mg total) by mouth at bedtime. 180 tablet 1   EPINEPHrine 0.3 mg/0.3 mL IJ SOAJ injection Inject 0.3 mLs (0.3 mg total) into the muscle once. (Patient not taking: Reported on 06/21/2021) 1 Device 2   No current facility-administered medications for this visit.   No Known Allergies   Review of Systems: All systems reviewed and negative except where noted in HPI.    No results found.  Physical Exam: BP 134/80 (BP  Location: Left Arm, Patient Position: Sitting, Cuff Size: Large)   Pulse 80   Ht 5' 5.25" (1.657 m) Comment: height measured without shoes  Wt 280 lb (127 kg)   BMI 46.24 kg/m  Constitutional: Pleasant,well-developed, Caucasian female in no acute distress. HEENT: Normocephalic and atraumatic. Conjunctivae are normal. No scleral icterus. Neck supple.  Cardiovascular: Normal rate, regular rhythm.  Pulmonary/chest: Effort normal and breath sounds normal. No wheezing, rales or rhonchi. Abdominal: Soft, nondistended, nontender. Bowel sounds active throughout. There are no masses palpable. No hepatomegaly. Extremities: no edema, well-healed vertical surgical incision scars on her knees bilaterally Neurological: Alert and oriented to person place and time. Skin: Skin is warm and dry. No rashes noted. Psychiatric: Normal mood and affect. Behavior is normal.  CBC    Component Value Date/Time   WBC 5.7 02/14/2019 1619   WBC 12.9 (H) 06/13/2017 0624   RBC 3.76 (L) 02/14/2019 1619   RBC 3.43 (L) 06/13/2017 0624   HGB 12.0 02/14/2019 1619   HCT 36.0 02/14/2019 1619   PLT 269 02/14/2019 1619   MCV 96 02/14/2019 1619   MCH 31.9 02/14/2019 1619   MCH 30.0 06/13/2017 0624   MCHC 33.3 02/14/2019 1619   MCHC 32.9 06/13/2017 0624   RDW 12.9 02/14/2019 1619   LYMPHSABS 2.2 02/14/2019 1619   MONOABS 0.6 06/19/2016 0937   EOSABS 0.4 02/14/2019 1619   BASOSABS 0.1 02/14/2019 1619    CMP     Component Value Date/Time   NA 141 02/14/2019 1619   K 4.3 02/14/2019 1619   CL 101 02/14/2019 1619   CO2 21 02/14/2019 1619   GLUCOSE 159 (H) 02/14/2019 1619   GLUCOSE 205 (H) 06/13/2017 0624   BUN 13 02/14/2019 1619   CREATININE 0.92 02/14/2019 1619   CREATININE 0.99 11/21/2014 1538   CALCIUM 9.1 02/14/2019 1619   PROT 6.7 06/08/2017 2113   PROT 6.9 11/16/2016 1459   ALBUMIN 3.6 06/08/2017 2113   ALBUMIN 4.7 11/16/2016 1459   AST 40 06/08/2017 2113   ALT 22 06/08/2017 2113   ALKPHOS 86  06/08/2017 2113   BILITOT 0.5 06/08/2017 2113   BILITOT 0.2 11/16/2016 1459   GFRNONAA 67 02/14/2019 1619   GFRAA 78 02/14/2019 1619     ASSESSMENT AND PLAN: 65 year old female overdue for initial average risk screening colonoscopy.  She has chronic constipation which existed prior to her taking chronic narcotics, usually only with 1 bowel movement per week, no other chronic GI symptoms.  No family history of colon cancer.  She is morbidly obese with sleep apnea, but has no known major cardiopulmonary comorbidities. For her constipation, I recommended she try taking MiraLAX daily as she has never tried this before.  To  improve her chances of a adequate bowel cleanse, I recommended that the week before her colonoscopy, she take the MiraLAX 2-3 times a day if needed to achieve a bowel movement daily before she does her bowel prep.  We did discuss alternative colon cancer screening modalities to include stool based tests, and the patient wished to proceed with the colonoscopy.  Colon cancer screening - Schedule for screening colonoscopy   Constipation - Start MiraLAX daily, can increase as needed to achieve a bowel movement every 2 days - Increase MiraLAX usage the week leading up to her colonoscopy to a goal of 1 BM/day, to increase chances of adequate bowel prep  The details, risks (including bleeding, perforation, infection, missed lesions, medication reactions and possible hospitalization or surgery if complications occur), benefits, and alternatives to colonoscopy with possible biopsy and possible polypectomy were discussed with the patient and she consents to proceed.   Glendon Dunwoody E. Tomasa Rand, MD [facility]    Janeece Agee, NP

## 2021-06-25 ENCOUNTER — Other Ambulatory Visit: Payer: Self-pay | Admitting: Registered Nurse

## 2021-06-25 ENCOUNTER — Other Ambulatory Visit: Payer: Self-pay

## 2021-06-25 ENCOUNTER — Telehealth: Payer: Self-pay | Admitting: Gastroenterology

## 2021-06-25 DIAGNOSIS — I10 Essential (primary) hypertension: Secondary | ICD-10-CM

## 2021-06-25 MED ORDER — CLENPIQ 10-3.5-12 MG-GM -GM/160ML PO SOLN: 320.0000 mL | Freq: Once | ORAL | 0 refills | Status: AC

## 2021-06-25 NOTE — Telephone Encounter (Signed)
Inbound call from patient. States plenvu is not covered by her insurance. Requesting Clenpiq since it is covered. Best ocntact number [phone]

## 2021-06-25 NOTE — Telephone Encounter (Signed)
Returned patients call. Clenpiq was sent in to patients pharmacy and patient was notified to come pick up new instructions. Patient stated she would be by to pick them up sometime next week.

## 2021-07-15 ENCOUNTER — Encounter: Payer: BC Managed Care – PPO | Admitting: Gastroenterology

## 2021-07-15 ENCOUNTER — Telehealth: Payer: Self-pay

## 2021-07-15 NOTE — Telephone Encounter (Signed)
Spoke with Dr. Tomasa Rand. He wants to cancel the patient for today and schedule her with Dr. Barron Alvine tomorrow at 9:30. PT will stay on clear liquids and start Free sample of plenvu tonight. Instructions and sample will be left at the front desk with the patient. Pts husband will pick up the rx today.

## 2021-07-15 NOTE — Telephone Encounter (Signed)
Patient called to report she has not had a bowel movement of any kind with either dose of the prep.  She is scheduled for a colonoscopy today at 3:00 pm.  Please advise.Sydney KitchenMarland Berry

## 2021-07-16 ENCOUNTER — Other Ambulatory Visit: Payer: Self-pay

## 2021-07-16 ENCOUNTER — Ambulatory Visit (AMBULATORY_SURGERY_CENTER): Payer: BC Managed Care – PPO | Admitting: Gastroenterology

## 2021-07-16 ENCOUNTER — Encounter: Payer: Self-pay | Admitting: Gastroenterology

## 2021-07-16 VITALS — BP 141/81 | HR 76 | Temp 97.1°F | Resp 19 | Ht 65.25 in | Wt 280.0 lb

## 2021-07-16 DIAGNOSIS — K5904 Chronic idiopathic constipation: Secondary | ICD-10-CM

## 2021-07-16 DIAGNOSIS — K640 First degree hemorrhoids: Secondary | ICD-10-CM

## 2021-07-16 DIAGNOSIS — Z1211 Encounter for screening for malignant neoplasm of colon: Secondary | ICD-10-CM | POA: Diagnosis not present

## 2021-07-16 DIAGNOSIS — Z1212 Encounter for screening for malignant neoplasm of rectum: Secondary | ICD-10-CM | POA: Diagnosis not present

## 2021-07-16 MED ORDER — SODIUM CHLORIDE 0.9 % IV SOLN: 500.0000 mL | Freq: Once | INTRAVENOUS | Status: DC

## 2021-07-16 NOTE — Progress Notes (Signed)
Report given to PACU, vss 

## 2021-07-16 NOTE — Op Note (Signed)
[facility] Patient Name: [name] Procedure Date: 07/16/2021 9:52 AM MRN: [id number] Endoscopist: Doristine Locks , MD Age: 65 Referring MD:  Date of Birth: 01-30-1956 Gender: Female Account #: [id number] Procedure:                Colonoscopy Indications:              Screening for colorectal malignant neoplasm. This                            is the patient's first colonoscopy.                           Additionally, she has a lifelong history of chronic                            constipation. Recently started on Miralax. Medicines:                Monitored Anesthesia Care Procedure:                Pre-Anesthesia Assessment:                           - Prior to the procedure, a History and Physical                            was performed, and patient medications and                            allergies were reviewed. The patient's tolerance of                            previous anesthesia was also reviewed. The risks                            and benefits of the procedure and the sedation                            options and risks were discussed with the patient.                            All questions were answered, and informed consent                            was obtained. Prior Anticoagulants: The patient has                            taken no previous anticoagulant or antiplatelet                            agents. ASA Grade Assessment: II - A patient with                            mild systemic disease. After reviewing the risks  and benefits, the patient was deemed in                            satisfactory condition to undergo the procedure.                           After obtaining informed consent, the colonoscope                            was passed under direct vision. Throughout the                            procedure, the patient's blood pressure, pulse, and                            oxygen saturations were monitored  continuously. The                            Colonoscope was introduced through the anus and                            advanced to the the cecum, identified by                            appendiceal orifice and ileocecal valve. The                            colonoscopy was technically difficult and complex                            due to a tortuous colon and looping in the                            ascending colon. The patient tolerated the                            procedure well. The quality of the bowel                            preparation was good. The ileocecal valve,                            appendiceal orifice, and rectum were photographed. Scope In: 9:58:25 AM Scope Out: 10:21:32 AM Scope Withdrawal Time: 0 hours 14 minutes 27 seconds  Total Procedure Duration: 0 hours 23 minutes 7 seconds  Findings:                 The perianal and digital rectal examinations were                            normal.                           The sigmoid colon was moderately tortuous and there  was moderate looping in the ascending colon.                            Advancing the scope required using manual pressure.                           The exam was otherwise normal throughout the                            remainder of the colon.                           Non-bleeding internal hemorrhoids were found during                            retroflexion. The hemorrhoids were small. Complications:            No immediate complications. Estimated Blood Loss:     Estimated blood loss: none. Impression:               - Tortuous colon. Otherwise, normal appearing colon                            throughout.                           - Non-bleeding internal hemorrhoids.                           - No specimens collected. Recommendation:           - Patient has a contact number available for                            emergencies. The signs and symptoms of potential                             delayed complications were discussed with the                            patient. Return to normal activities tomorrow.                            Written discharge instructions were provided to the                            patient.                           - Resume previous diet.                           - Continue present medications.                           - Repeat colonoscopy in 10 years for screening  purposes.                           - Follow-up with Dr. Tomasa Rand in the GI clinic                            PRN.                           - Continue Miralax as prescribed. Doristine Locks, MD 07/16/2021 10:27:41 AM

## 2021-07-16 NOTE — Progress Notes (Signed)
GASTROENTEROLOGY PROCEDURE H&P NOTE   Primary Care Physician: Janeece Agee, NP    Reason for Procedure:   Colon cancer screening  Plan:    Colonoscopy  Patient is appropriate for endoscopic procedure(s) in the ambulatory ([facility]) setting.  The nature of the procedure, as well as the risks, benefits, and alternatives were carefully and thoroughly reviewed with the patient. Ample time for discussion and questions allowed. The patient understood, was satisfied, and agreed to proceed.     HPI: [name] is a 65 y.o. female who presents for Colonoscopy for initial colon cancer screening .  Patient was most recently seen in the Gastroenterology Clinic by Dr. Tomasa Rand on 06/21/2021. Was initially scheduled for colonoscopy with Dr. Tomasa Rand yesterday, but due to inadequate prep, was cancelled and rescheduled to today with additional bowel preparation. She does have a lifelong hx of constipation, and recent started on Miralax. Otherwise, no interval change in medical history since that appointment. Please refer to that note for full details regarding GI history and clinical presentation.   Past Medical History:  Diagnosis Date   Anemia    Arthritis    Depression    Fibromyalgia    HTN (hypertension)    MRSA (methicillin resistant Staphylococcus aureus)    Obese    Plantar fasciitis    Pneumonia    Seasonal allergies    Sleep apnea 2012   took test in [place]-told to use cpap-could not ude    Past Surgical History:  Procedure Laterality Date   ANTERIOR CERVICAL DECOMP/DISCECTOMY FUSION  05/09/2008   C5-6, C6-7; with arthrodesis also   GASTRIC BYPASS  10/24/2000   KNEE ARTHROSCOPY  10/24/2002   rt and lt knee   ROTATOR CUFF REPAIR W/ DISTAL CLAVICLE EXCISION  01/06/2006   right   SHOULDER ARTHROSCOPY Left 10/24/2001   TONSILLECTOMY     TOTAL KNEE ARTHROPLASTY  03/29/2004   left   TOTAL KNEE ARTHROPLASTY  11/07/2003   right   TOTAL KNEE REVISION  07/30/2010    right; with lateral release    Prior to Admission medications   Medication Sig Start Date End Date Taking? Authorizing Provider  amLODipine (NORVASC) 5 MG tablet TAKE 1 TABLET BY MOUTH EVERY DAY 06/03/21   Janeece Agee, NP  DULoxetine (CYMBALTA) 60 MG capsule Take 1 capsule by mouth daily.    [provider]  EPINEPHrine 0.3 mg/0.3 mL IJ SOAJ injection Inject 0.3 mLs (0.3 mg total) into the muscle once. Patient not taking: Reported on 06/21/2021 05/07/15   Lorre Nick, MD  HYDROcodone-acetaminophen [facility]) 10-325 MG tablet Take 1 tablet by mouth 2 (two) times daily as needed. 06/10/21   [provider]  metaxalone (SKELAXIN) 800 MG tablet Take 800 mg by mouth 3 (three) times daily. 05/09/21   [provider]  morphine (MS CONTIN) 30 MG 12 hr tablet SMARTSIG:1 Tablet(s) By Mouth Every 12 Hours 04/17/21   [provider]  PEG-KCl-NaCl-NaSulf-Na Asc-C (PLENVU) 140 g SOLR Use as directed. Manufacturer's coupon Universal coupon code:BIN: [id number]; GROUP: [id number]; PCN: [id number]; ID: [id number]; PAY NO MORE $50; NO prior authorization 06/21/21   Jenel Lucks, MD  SYMPROIC 0.2 MG TABS Take 1 tablet by mouth daily. 03/11/19   [provider]  traZODone (DESYREL) 100 MG tablet Take 1 tablet (100 mg total) by mouth at bedtime. 06/03/20   Janeece Agee, NP    Current Outpatient Medications  Medication Sig Dispense Refill   amLODipine (NORVASC) 5 MG tablet TAKE 1 TABLET BY  MOUTH EVERY DAY 30 tablet 0   DULoxetine (CYMBALTA) 60 MG capsule Take 1 capsule by mouth daily.     EPINEPHrine 0.3 mg/0.3 mL IJ SOAJ injection Inject 0.3 mLs (0.3 mg total) into the muscle once. (Patient not taking: Reported on 06/21/2021) 1 Device 2   HYDROcodone-acetaminophen (NORCO) 10-325 MG tablet Take 1 tablet by mouth 2 (two) times daily as needed.     metaxalone (SKELAXIN) 800 MG tablet Take 800 mg by mouth 3 (three) times daily.     morphine (MS CONTIN) 30 MG 12 hr tablet  SMARTSIG:1 Tablet(s) By Mouth Every 12 Hours     PEG-KCl-NaCl-NaSulf-Na Asc-C (PLENVU) 140 g SOLR Use as directed. Manufacturer's coupon Universal coupon code:BIN: [id number]; GROUP: [id number]; PCN: [id number]; ID: [id number]; PAY NO MORE $50; NO prior authorization 1 each 0   SYMPROIC 0.2 MG TABS Take 1 tablet by mouth daily.     traZODone (DESYREL) 100 MG tablet Take 1 tablet (100 mg total) by mouth at bedtime. 180 tablet 1   Current Facility-Administered Medications  Medication Dose Route Frequency Provider Last Rate Last Admin   0.9 %  sodium chloride infusion  500 mL Intravenous Once Katelee Schupp V, DO        Allergies as of 07/16/2021   (No Known Allergies)    Family History  Problem Relation Age of Onset   Heart disease Mother    COPD Mother        smoker   Bladder Cancer Father    Heart attack Brother    Heart disease Maternal Grandfather    Alcoholism Maternal Grandfather    Diabetes Paternal Grandfather     Social History   Socioeconomic History   Marital status: Married    Spouse name: Not on file   Number of children: 0   Years of education: Not on file   Highest education level: Not on file  Occupational History   Occupation: retired school system  Tobacco Use   Smoking status: Former    Types: Cigarettes    Quit date: 05/16/1981    Years since quitting: 40.1   Smokeless tobacco: Never  Vaping Use   Vaping Use: Never used  Substance and Sexual Activity   Alcohol use: Yes    Alcohol/week: 28.0 standard drinks    Types: 28 Cans of beer per week    Comment: 2-4 per day   Drug use: No   Sexual activity: Yes    Birth control/protection: Post-menopausal    Comment: INTERCOURSE AGE 24, SEXUAL PARTNERS MORE THAN 5  Other Topics Concern   Not on file  Social History Narrative   Pt lives at home with husband, retired IT sales professional. They are caretakers to a 64 yo nonverbal autistic woman.    Social Determinants of Health   Financial Resource Strain: Not on file   Food Insecurity: Not on file  Transportation Needs: Not on file  Physical Activity: Not on file  Stress: Not on file  Social Connections: Not on file  Intimate Partner Violence: Not on file    Physical Exam: Vital signs in last 24 hours: @BP  (!) 156/91   Pulse 81   Temp (!) 97.1 F (36.2 C) (Temporal)   Ht 5' 5.25" (1.657 m)   Wt 280 lb (127 kg)   SpO2 93%   BMI 46.24 kg/m  GEN: NAD EYE: Sclerae anicteric ENT: MMM CV: Non-tachycardic Pulm: CTA b/l GI: Soft, NT/ND NEURO:  Alert & Oriented x 3   Knox Holdman, DO  [facility]   07/16/2021 9:35 AM

## 2021-07-16 NOTE — Patient Instructions (Signed)
Resume previous diet Continue current medications Repeat colonoscopy in 10 years!!!!!!  YOU HAD AN ENDOSCOPIC PROCEDURE TODAY AT THE [facility]:   Refer to the procedure report that was given to you for any specific questions about what was found during the examination.  If the procedure report does not answer your questions, please call your gastroenterologist to clarify.  If you requested that your care partner not be given the details of your procedure findings, then the procedure report has been included in a sealed envelope for you to review at your convenience later.  YOU SHOULD EXPECT: Some feelings of bloating in the abdomen. Passage of more gas than usual.  Walking can help get rid of the air that was put into your GI tract during the procedure and reduce the bloating. If you had a lower endoscopy (such as a colonoscopy or flexible sigmoidoscopy) you may notice spotting of blood in your stool or on the toilet paper. If you underwent a bowel prep for your procedure, you may not have a normal bowel movement for a few days.  Please Note:  You might notice some irritation and congestion in your nose or some drainage.  This is from the oxygen used during your procedure.  There is no need for concern and it should clear up in a day or so.  SYMPTOMS TO REPORT IMMEDIATELY:  Following lower endoscopy (colonoscopy or flexible sigmoidoscopy):  Excessive amounts of blood in the stool  Significant tenderness or worsening of abdominal pains  Swelling of the abdomen that is new, acute  Fever of 100F or higher  For urgent or emergent issues, a gastroenterologist can be reached at any hour by calling [phone]. Do not use MyChart messaging for urgent concerns.   DIET:  We do recommend a small meal at first, but then you may proceed to your regular diet.  Drink plenty of fluids but you should avoid alcoholic beverages for 24 hours.  ACTIVITY:  You should plan to take it easy for the  rest of today and you should NOT DRIVE or use heavy machinery until tomorrow (because of the sedation medicines used during the test).    FOLLOW UP: Our staff will call the number listed on your records 48-72 hours following your procedure to check on you and address any questions or concerns that you may have regarding the information given to you following your procedure. If we do not reach you, we will leave a message.  We will attempt to reach you two times.  During this call, we will ask if you have developed any symptoms of COVID 19. If you develop any symptoms (ie: fever, flu-like symptoms, shortness of breath, cough etc.) before then, please call [phone].  If you test positive for Covid 19 in the 2 weeks post procedure, please call and report this information to us.    If any biopsies were taken you will be contacted by phone or by letter within the next 1-3 weeks.  Please call us at [phone] if you have not heard about the biopsies in 3 weeks.   SIGNATURES/CONFIDENTIALITY: You and/or your care partner have signed paperwork which will be entered into your electronic medical record.  These signatures attest to the fact that that the information above on your After Visit Summary has been reviewed and is understood.  Full responsibility of the confidentiality of this discharge information lies with you and/or your care-partner.  

## 2021-07-16 NOTE — Progress Notes (Signed)
VS-CW 

## 2021-07-19 ENCOUNTER — Telehealth: Payer: Self-pay

## 2021-07-19 NOTE — Telephone Encounter (Signed)
  Follow up Call-  Call back number 07/16/2021  Post procedure Call Back phone  # [phone]  Permission to leave phone message Yes  Some recent data might be hidden     Patient questions:  Do you have a fever, pain , or abdominal swelling? No. Pain Score  0 *  Have you tolerated food without any problems? Yes.    Have you been able to return to your normal activities? Yes.    Do you have any questions about your discharge instructions: Diet   No. Medications  No. Follow up visit  No.  Do you have questions or concerns about your Care? No.  Actions: * If pain score is 4 or above: No action needed, pain <4.

## 2021-08-06 ENCOUNTER — Other Ambulatory Visit: Payer: Self-pay | Admitting: Registered Nurse

## 2021-08-06 DIAGNOSIS — F5104 Psychophysiologic insomnia: Secondary | ICD-10-CM

## 2021-09-03 ENCOUNTER — Encounter: Payer: Self-pay | Admitting: Registered Nurse

## 2021-09-03 ENCOUNTER — Telehealth (INDEPENDENT_AMBULATORY_CARE_PROVIDER_SITE_OTHER): Payer: BC Managed Care – PPO | Admitting: Registered Nurse

## 2021-09-03 ENCOUNTER — Other Ambulatory Visit: Payer: Self-pay

## 2021-09-03 DIAGNOSIS — J029 Acute pharyngitis, unspecified: Secondary | ICD-10-CM

## 2021-09-03 MED ORDER — DM-GUAIFENESIN ER 30-600 MG PO TB12: 1.0000 | ORAL_TABLET | Freq: Two times a day (BID) | ORAL | 0 refills | Status: DC

## 2021-09-03 MED ORDER — AZITHROMYCIN 250 MG PO TABS: ORAL_TABLET | ORAL | 0 refills | Status: AC

## 2021-09-03 MED ORDER — PREDNISONE 10 MG (21) PO TBPK: ORAL_TABLET | ORAL | 0 refills | Status: DC

## 2021-09-03 MED ORDER — BENZONATATE 100 MG PO CAPS
100.0000 mg | ORAL_CAPSULE | Freq: Three times a day (TID) | ORAL | 0 refills | Status: DC | PRN
Start: 2021-09-03 — End: 2022-02-14

## 2021-09-03 NOTE — Progress Notes (Signed)
Telemedicine Encounter- SOAP NOTE Established Patient  This telephone encounter was conducted with the patient's (or proxy's) verbal consent via audio telecommunications: yes/no: Yes Patient was instructed to have this encounter in a suitably private space; and to only have persons present to whom they give permission to participate. In addition, patient identity was confirmed by use of name plus two identifiers (DOB and address).  I discussed the limitations, risks, security and privacy concerns of performing an evaluation and management service by telephone and the availability of in person appointments. I also discussed with the patient that there may be a patient responsible charge related to this service. The patient expressed understanding and agreed to proceed.  I spent a total of 16 minutes talking with the patient or their proxy.  Patient at home Provider in office  Participants: Jari Sportsman, NP and Thomas Hoff  Chief Complaint  Patient presents with   Sore Throat    Patient states for about 3 days she has been having a sore throat and now has loss her voice. She has been taking OTC night time medication with no relief .    Subjective   [name] is a 65 y.o. established patient. Telephone visit today for sore throat   HPI Ongoing 3 days, worsening OTC meds not helping. No systemic symptoms Does have a productive cough with green/yellow mucus No fever, malaise, fatigue, chills.  Starting to have some malaise today. 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   Cellulitis of left lower  extremity 01/09/2016   Psoriatic arthritis (HCC)    Cough    Wheezing    Chronic anemia 12/05/2015   Chronic venous insufficiency 12/04/2015   Hoarding disorder 03/05/2015   Alcohol abuse 11/21/2014   Anxiety state 11/21/2014   BMI 40.0-44.9, adult (HCC) 11/21/2014   Epistaxis 09/24/2014   Bruising 05/03/2014   Hematemesis 03/07/2014   Depressive disorder 12/09/2013   GAD (generalized anxiety disorder) 09/17/2013   Fibromyalgia 07/22/2013    Past Medical History:  Diagnosis Date   Allergy    Anemia    Anxiety    Arthritis    Depression    Fibromyalgia    HTN (hypertension)    MRSA (methicillin resistant Staphylococcus aureus)    Obese    Plantar fasciitis    Pneumonia    Seasonal allergies    Sleep apnea 2012   took test in [place]-told to use cpap-could not ude    Current Outpatient Medications  Medication Sig Dispense Refill   amLODipine (NORVASC) 5 MG tablet TAKE 1 TABLET BY MOUTH EVERY DAY 30 tablet 0   DULoxetine (CYMBALTA) 60 MG capsule Take 1 capsule by mouth daily.     HYDROcodone-acetaminophen (NORCO) 10-325 MG tablet Take 1 tablet by mouth 2 (two) times daily as needed.     methocarbamol (ROBAXIN) 500 MG tablet Take 500 mg by mouth 3 (three) times daily as needed for muscle spasms.     morphine (MS CONTIN) 30 MG 12 hr tablet SMARTSIG:1 Tablet(s) By Mouth Every 12 Hours     SYMPROIC 0.2 MG TABS Take 1 tablet by mouth daily.  traZODone (DESYREL) 100 MG tablet TAKE 1 TABLET BY MOUTH EVERYDAY AT BEDTIME 90 tablet 3   azithromycin (ZITHROMAX) 250 MG tablet Take 2 tablets on day 1, then 1 tablet daily on days 2 through 5 6 tablet 0   benzonatate (TESSALON) 100 MG capsule Take 1 capsule (100 mg total) by mouth 3 (three) times daily as needed for cough. 20 capsule 0   dextromethorphan-guaiFENesin (MUCINEX DM) 30-600 MG 12hr tablet Take 1 tablet by mouth 2 (two) times daily. 20 tablet 0   EPINEPHrine 0.3 mg/0.3 mL IJ SOAJ injection Inject 0.3 mLs (0.3 mg total)  into the muscle once. (Patient not taking: No sig reported) 1 Device 2   metaxalone (SKELAXIN) 800 MG tablet Take 800 mg by mouth 3 (three) times daily. (Patient not taking: No sig reported)     predniSONE (STERAPRED UNI-PAK 21 TAB) 10 MG (21) TBPK tablet Take per package instructions. Do not skip doses. Finish entire supply. 1 each 0   No current facility-administered medications for this visit.    No Known Allergies  Social History   Socioeconomic History   Marital status: Married    Spouse name: Not on file   Number of children: 0   Years of education: Not on file   Highest education level: Not on file  Occupational History   Occupation: retired school system  Tobacco Use   Smoking status: Former    Types: Cigarettes    Quit date: 05/16/1981    Years since quitting: 40.3   Smokeless tobacco: Never  Vaping Use   Vaping Use: Never used  Substance and Sexual Activity   Alcohol use: Yes    Alcohol/week: 28.0 standard drinks    Types: 28 Cans of beer per week    Comment: 2-4 per day   Drug use: No   Sexual activity: Yes    Birth control/protection: Post-menopausal    Comment: INTERCOURSE AGE 36, SEXUAL PARTNERS MORE THAN 5  Other Topics Concern   Not on file  Social History Narrative   Pt lives at home with husband, retired IT sales professional. They are caretakers to a 64 yo nonverbal autistic woman.    Social Determinants of Health   Financial Resource Strain: Not on file  Food Insecurity: Not on file  Transportation Needs: Not on file  Physical Activity: Not on file  Stress: Not on file  Social Connections: Not on file  Intimate Partner Violence: Not on file    Review of Systems  Constitutional: Negative.   HENT:  Positive for sore throat. Negative for congestion, ear discharge, ear pain, hearing loss, nosebleeds, sinus pain and tinnitus.   Eyes: Negative.   Respiratory:  Positive for cough and sputum production. Negative for hemoptysis, shortness of breath, wheezing  and stridor.   Cardiovascular: Negative.   Gastrointestinal: Negative.   Genitourinary: Negative.   Musculoskeletal: Negative.   Skin: Negative.   Neurological: Negative.   Endo/Heme/Allergies: Negative.   Psychiatric/Behavioral: Negative.    All other systems reviewed and are negative.  Objective   Vitals as reported by the patient: There were no vitals filed for this visit.  [name] was seen today for sore throat.  Diagnoses and all orders for this visit:  Acute sore throat -     azithromycin (ZITHROMAX) 250 MG tablet; Take 2 tablets on day 1, then 1 tablet daily on days 2 through 5 -     predniSONE (STERAPRED UNI-PAK 21 TAB) 10 MG (21) TBPK tablet; Take per package instructions. Do not  skip doses. Finish entire supply. -     benzonatate (TESSALON) 100 MG capsule; Take 1 capsule (100 mg total) by mouth 3 (three) times daily as needed for cough. -     dextromethorphan-guaiFENesin (MUCINEX DM) 30-600 MG 12hr tablet; Take 1 tablet by mouth 2 (two) times daily.   PLAN Seems as viral infection. Supportive care with tessalon, mucinex, and recommend OTC lozenges and chloraseptic.  If no improvement in 2-3 days or with continued worsening, pt will pick up and start zpack and prednisone taper. Reviewed risks, benefits, AE, and alternatives to treatment course. Pt voices understanding. ER and urgent care follow up reasons reviewed. Patient encouraged to call clinic with any questions, comments, or concerns.  I discussed the assessment and treatment plan with the patient. The patient was provided an opportunity to ask questions and all were answered. The patient agreed with the plan and demonstrated an understanding of the instructions.   The patient was advised to call back or seek an in-person evaluation if the symptoms worsen or if the condition fails to improve as anticipated.  I provided 14 minutes of non-face-to-face time during this encounter.  Janeece Agee, NP

## 2021-09-03 NOTE — Patient Instructions (Signed)
° ° ° °  If you have lab work done today you will be contacted with your lab results within the next 2 weeks.  If you have not heard from us then please contact us. The fastest way to get your results is to register for My Chart. ° ° °IF you received an x-ray today, you will receive an invoice from [facility]. Please contact [facility] at [phone] with questions or concerns regarding your invoice.  ° °IF you received labwork today, you will receive an invoice from [facility]. Please contact [facility] at [phone] with questions or concerns regarding your invoice.  ° °Our billing staff will not be able to assist you with questions regarding bills from these companies. ° °You will be contacted with the lab results as soon as they are available. The fastest way to get your results is to activate your My Chart account. Instructions are located on the last page of this paperwork. If you have not heard from us regarding the results in 2 weeks, please contact this office. °  ° ° ° °

## 2021-09-09 ENCOUNTER — Encounter: Payer: Self-pay | Admitting: Nurse Practitioner

## 2021-09-27 ENCOUNTER — Encounter: Payer: Self-pay | Admitting: Nurse Practitioner

## 2021-11-25 ENCOUNTER — Other Ambulatory Visit: Payer: Self-pay | Admitting: Registered Nurse

## 2021-11-25 DIAGNOSIS — I10 Essential (primary) hypertension: Secondary | ICD-10-CM

## 2021-12-23 ENCOUNTER — Other Ambulatory Visit: Payer: Self-pay | Admitting: Registered Nurse

## 2021-12-23 DIAGNOSIS — I10 Essential (primary) hypertension: Secondary | ICD-10-CM

## 2022-01-17 ENCOUNTER — Other Ambulatory Visit: Payer: Self-pay | Admitting: Registered Nurse

## 2022-01-17 DIAGNOSIS — I10 Essential (primary) hypertension: Secondary | ICD-10-CM

## 2022-01-30 ENCOUNTER — Telehealth: Payer: Self-pay | Admitting: Registered Nurse

## 2022-01-30 DIAGNOSIS — I10 Essential (primary) hypertension: Secondary | ICD-10-CM

## 2022-02-07 NOTE — Telephone Encounter (Signed)
Attempted to call patient to see which medication she was referring to. Because amlodipine was refilled on 01/31/2022 ?

## 2022-02-07 NOTE — Telephone Encounter (Signed)
Patient is calling in stating she is completely out, asking for a short supply to last until she comes in next week.  ?

## 2022-02-14 ENCOUNTER — Ambulatory Visit: Payer: BC Managed Care – PPO | Admitting: Registered Nurse

## 2022-02-14 ENCOUNTER — Encounter: Payer: Self-pay | Admitting: Registered Nurse

## 2022-02-14 VITALS — BP 132/92 | HR 78 | Temp 98.2°F | Resp 16 | Ht 65.25 in | Wt 267.2 lb

## 2022-02-14 DIAGNOSIS — I10 Essential (primary) hypertension: Secondary | ICD-10-CM

## 2022-02-14 DIAGNOSIS — Z6841 Body Mass Index (BMI) 40.0 and over, adult: Secondary | ICD-10-CM

## 2022-02-14 MED ORDER — PHENTERMINE HCL 37.5 MG PO CAPS: 37.5000 mg | ORAL_CAPSULE | ORAL | 0 refills | Status: DC

## 2022-02-14 MED ORDER — AMLODIPINE BESYLATE 5 MG PO TABS: 5.0000 mg | ORAL_TABLET | Freq: Every day | ORAL | 3 refills | Status: DC

## 2022-02-14 NOTE — Progress Notes (Signed)
? ?Established Patient Office Visit ? ?Subjective:  ?Patient ID: [name], female    DOB: 1956/05/21  Age: 66 y.o. MRN: [id number] ? ?CC:  ?Chief Complaint  ?Patient presents with  ? Hypertension  ?  Pt needs BP med did have some dizziness after being out last week   ? Weight Loss  ?  Discuss restarting phentermine   ? ? ?HPI ?Thomas Hoff presents for htn ? ?Hypertension: ?Patient Currently taking: amlodipine 5mg  po qd ?Good effect. No AEs. ?Denies CV symptoms including: chest pain, shob, doe, headache, visual changes, fatigue, claudication, and dependent edema.  ? ?Previous readings and labs: ?BP Readings from Last 3 Encounters:  ?02/14/22 (!) 148/78  ?07/16/21 (!) 141/81  ?06/21/21 134/80  ? ?Lab Results  ?Component Value Date  ? CREATININE 0.92 02/14/2019  ? ? ?Weight ?Interested in phentermine ?Has been on before with good effect, no AE. ?Hopes to restart.  ? ?Outpatient Medications Prior to Visit  ?Medication Sig Dispense Refill  ? DULoxetine (CYMBALTA) 60 MG capsule Take 1 capsule by mouth daily.    ? EPINEPHrine 0.3 mg/0.3 mL IJ SOAJ injection Inject 0.3 mLs (0.3 mg total) into the muscle once. 1 Device 2  ? HYDROcodone-acetaminophen (NORCO) 10-325 MG tablet Take 1 tablet by mouth 2 (two) times daily as needed.    ? metaxalone (SKELAXIN) 800 MG tablet Take 800 mg by mouth 3 (three) times daily.    ? morphine (MS CONTIN) 30 MG 12 hr tablet SMARTSIG:1 Tablet(s) By Mouth Every 12 Hours    ? SYMPROIC 0.2 MG TABS Take 1 tablet by mouth daily.    ? traZODone (DESYREL) 100 MG tablet TAKE 1 TABLET BY MOUTH EVERYDAY AT BEDTIME 90 tablet 3  ? amLODipine (NORVASC) 5 MG tablet TAKE 1 TABLET BY MOUTH EVERY DAY 30 tablet 0  ? benzonatate (TESSALON) 100 MG capsule Take 1 capsule (100 mg total) by mouth 3 (three) times daily as needed for cough. (Patient not taking: Reported on 02/14/2022) 20 capsule 0  ? dextromethorphan-guaiFENesin (MUCINEX DM) 30-600 MG 12hr tablet Take 1 tablet by mouth 2 (two) times daily. (Patient  not taking: Reported on 02/14/2022) 20 tablet 0  ? methocarbamol (ROBAXIN) 500 MG tablet Take 500 mg by mouth 3 (three) times daily as needed for muscle spasms. (Patient not taking: Reported on 02/14/2022)    ? predniSONE (STERAPRED UNI-PAK 21 TAB) 10 MG (21) TBPK tablet Take per package instructions. Do not skip doses. Finish entire supply. (Patient not taking: Reported on 02/14/2022) 1 each 0  ? ?No facility-administered medications prior to visit.  ? ? ?Review of Systems  ?Constitutional: Negative.   ?HENT: Negative.    ?Eyes: Negative.   ?Respiratory: Negative.    ?Cardiovascular: Negative.   ?Gastrointestinal: Negative.   ?Genitourinary: Negative.   ?Musculoskeletal: Negative.   ?Skin: Negative.   ?Neurological: Negative.   ?Psychiatric/Behavioral: Negative.    ?All other systems reviewed and are negative. ? ?  ?Objective:  ?  ? ?BP (!) 148/78   Pulse 78   Temp 98.2 ?F (36.8 ?C) (Temporal)   Resp 16   Ht 5' 5.25" (1.657 m)   Wt 267 lb 3.2 oz (121.2 kg)   SpO2 93%   BMI 44.12 kg/m?  ? ?Wt Readings from Last 3 Encounters:  ?02/14/22 267 lb 3.2 oz (121.2 kg)  ?07/16/21 280 lb (127 kg)  ?06/21/21 280 lb (127 kg)  ? ?Physical Exam ?Vitals and nursing note reviewed.  ?Constitutional:   ?  General: She is not in acute distress. ?   Appearance: Normal appearance. She is normal weight. She is not ill-appearing, toxic-appearing or diaphoretic.  ?Cardiovascular:  ?   Rate and Rhythm: Normal rate and regular rhythm.  ?   Heart sounds: Normal heart sounds. No murmur heard. ?  No friction rub. No gallop.  ?Pulmonary:  ?   Effort: Pulmonary effort is normal. No respiratory distress.  ?   Breath sounds: Normal breath sounds. No stridor. No wheezing, rhonchi or rales.  ?Chest:  ?   Chest wall: No tenderness.  ?Skin: ?   General: Skin is warm and dry.  ?Neurological:  ?   General: No focal deficit present.  ?   Mental Status: She is alert and oriented to person, place, and time. Mental status is at baseline.  ?Psychiatric:      ?   Mood and Affect: Mood normal.     ?   Behavior: Behavior normal.     ?   Thought Content: Thought content normal.     ?   Judgment: Judgment normal.  ? ? ?No results found for any visits on 02/14/22. ? ? ? ?The ASCVD Risk score (Arnett DK, et al., 2019) failed to calculate for the following reasons: ?  Cannot find a previous HDL lab ?  Cannot find a previous total cholesterol lab ? ?  ?Assessment & Plan:  ? ?Problem List Items Addressed This Visit   ? ?  ? Cardiovascular and Mediastinum  ? Essential hypertension  ?  Uncontrolled. Restart amlodipine 5mg . Recheck in 3-4 mo at CPE ? ?  ?  ? Relevant Medications  ? amLODipine (NORVASC) 5 MG tablet  ?  ? Other  ? Morbid obesity with BMI of 40.0-44.9, adult (HCC) - Primary  ?  Restart phentermine. Reviewed risks, benefits, and side effects, pt voices understanding. ? ? ?  ?  ? Relevant Medications  ? phentermine 37.5 MG capsule  ? ? ?Meds ordered this encounter  ?Medications  ? amLODipine (NORVASC) 5 MG tablet  ?  Sig: Take 1 tablet (5 mg total) by mouth daily.  ?  Dispense:  90 tablet  ?  Refill:  3  ?  Order Specific Question:   Supervising Provider  ?  Answer:   08-06-1982, JEFFREY R [[id number]]  ? phentermine 37.5 MG capsule  ?  Sig: Take 1 capsule (37.5 mg total) by mouth every morning.  ?  Dispense:  90 capsule  ?  Refill:  0  ?  Order Specific Question:   Supervising Provider  ?  Answer:   Neva Seat, JEFFREY R [[id number]]  ? ? ?Return in about 3 months (around 05/16/2022) for CPE and labs.  ? ? ?05/18/2022, NP ?

## 2022-02-14 NOTE — Assessment & Plan Note (Signed)
Restart phentermine. Reviewed risks, benefits, and side effects, pt voices understanding. ? ?

## 2022-02-14 NOTE — Patient Instructions (Signed)
Ms. Jaye -  ? ?Great to see you ? ?Call with concerns ? ?See you in 3-4 mo for physical and labs ? ?Thanks, ? ?Rich  ?

## 2022-02-14 NOTE — Assessment & Plan Note (Signed)
Uncontrolled. Restart amlodipine 5mg . Recheck in 3-4 mo at CPE ?

## 2022-03-07 ENCOUNTER — Telehealth: Payer: Self-pay | Admitting: Registered Nurse

## 2022-03-07 NOTE — Telephone Encounter (Signed)
Patient called and stated that every time she goes to the bathroom she is having a bowel movement even when she doesn't feel like she has to do that. It is loose and snaky like. It has been going on for about 2 weeks. She has not tried any OTC antidiarrheal medications yet but would start today. She wanted to know if there was anything else [name] would advise and if he would like to see her ?

## 2022-03-07 NOTE — Telephone Encounter (Signed)
Would need visit to learn more about the situation but general advice based on limited info is to stay well hydrated, high fiber diet, ok to use imodium intermittently, watch out for blood or mucus in stool, dark tarry stools, abdominal pain, abnormal/unexpected weight loss, nausea/vomiting ? ?Thanks, ? ?Rich

## 2022-03-08 ENCOUNTER — Other Ambulatory Visit: Payer: Self-pay | Admitting: Registered Nurse

## 2022-03-08 ENCOUNTER — Other Ambulatory Visit: Payer: Self-pay

## 2022-03-08 ENCOUNTER — Ambulatory Visit: Payer: BC Managed Care – PPO | Admitting: Registered Nurse

## 2022-03-08 ENCOUNTER — Encounter: Payer: Self-pay | Admitting: Registered Nurse

## 2022-03-08 VITALS — BP 138/92 | HR 68 | Temp 98.2°F | Resp 18 | Ht 65.25 in | Wt 265.2 lb

## 2022-03-08 DIAGNOSIS — R195 Other fecal abnormalities: Secondary | ICD-10-CM | POA: Diagnosis not present

## 2022-03-08 DIAGNOSIS — D649 Anemia, unspecified: Secondary | ICD-10-CM

## 2022-03-08 DIAGNOSIS — Z9884 Bariatric surgery status: Secondary | ICD-10-CM | POA: Diagnosis not present

## 2022-03-08 DIAGNOSIS — R194 Change in bowel habit: Secondary | ICD-10-CM | POA: Diagnosis not present

## 2022-03-08 LAB — COMPREHENSIVE METABOLIC PANEL
ALT: 10 U/L (ref 0–35)
AST: 18 U/L (ref 0–37)
Albumin: 4.7 g/dL (ref 3.5–5.2)
Alkaline Phosphatase: 53 U/L (ref 39–117)
BUN: 9 mg/dL (ref 6–23)
CO2: 32 mEq/L (ref 19–32)
Calcium: 9.5 mg/dL (ref 8.4–10.5)
Chloride: 100 mEq/L (ref 96–112)
Creatinine, Ser: 1.02 mg/dL (ref 0.40–1.20)
GFR: 58.07 mL/min — ABNORMAL LOW (ref >60.00)
Glucose, Bld: 88 mg/dL (ref 70–99)
Potassium: 3.9 mEq/L (ref 3.5–5.1)
Sodium: 141 mEq/L (ref 135–145)
Total Bilirubin: 0.6 mg/dL (ref 0.2–1.2)
Total Protein: 6.7 g/dL (ref 6.0–8.3)

## 2022-03-08 LAB — CBC WITH DIFFERENTIAL/PLATELET
Basophils Absolute: 0 10*3/uL (ref 0.0–0.1)
Basophils Relative: 1.1 % (ref 0.0–3.0)
Eosinophils Absolute: 0.3 10*3/uL (ref 0.0–0.7)
Eosinophils Relative: 8.1 % — ABNORMAL HIGH (ref 0.0–5.0)
HCT: 35.2 % — ABNORMAL LOW (ref 36.0–46.0)
Hemoglobin: 11.4 g/dL — ABNORMAL LOW (ref 12.0–15.0)
Lymphocytes Relative: 36.6 % (ref 12.0–46.0)
Lymphs Abs: 1.4 10*3/uL (ref 0.7–4.0)
MCHC: 32.4 g/dL (ref 30.0–36.0)
MCV: 91 fl (ref 78.0–100.0)
Monocytes Absolute: 0.4 10*3/uL (ref 0.1–1.0)
Monocytes Relative: 10.8 % (ref 3.0–12.0)
Neutro Abs: 1.7 10*3/uL (ref 1.4–7.7)
Neutrophils Relative %: 43.4 % (ref 43.0–77.0)
Platelets: 252 10*3/uL (ref 150.0–400.0)
RBC: 3.87 Mil/uL (ref 3.87–5.11)
RDW: 18 % — ABNORMAL HIGH (ref 11.5–15.5)
WBC: 3.9 10*3/uL — ABNORMAL LOW (ref 4.0–10.5)

## 2022-03-08 LAB — URINALYSIS, ROUTINE W REFLEX MICROSCOPIC
Bilirubin Urine: NEGATIVE
Hgb urine dipstick: NEGATIVE
Ketones, ur: NEGATIVE
Leukocytes,Ua: NEGATIVE
Nitrite: NEGATIVE
RBC / HPF: NONE SEEN (ref 0–?)
Specific Gravity, Urine: 1.015 (ref 1.000–1.030)
Total Protein, Urine: NEGATIVE
Urine Glucose: NEGATIVE
Urobilinogen, UA: 1 (ref 0.0–1.0)
pH: 7.5 (ref 5.0–8.0)

## 2022-03-08 LAB — C-REACTIVE PROTEIN: CRP: <1 mg/dL (ref 0.5–20.0)

## 2022-03-08 LAB — B12 AND FOLATE PANEL
Folate: >23.5 ng/mL (ref >5.9)
Vitamin B-12: 511 pg/mL (ref 211–911)

## 2022-03-08 LAB — TSH: TSH: 3.76 u[IU]/mL (ref 0.35–5.50)

## 2022-03-08 LAB — SEDIMENTATION RATE: Sed Rate: 11 mm/hr (ref 0–30)

## 2022-03-08 LAB — HEMOGLOBIN A1C: Hgb A1c MFr Bld: 5.4 % (ref 4.6–6.5)

## 2022-03-08 LAB — VITAMIN D 25 HYDROXY (VIT D DEFICIENCY, FRACTURES): VITD: 36.85 ng/mL (ref 30.00–100.00)

## 2022-03-08 NOTE — Patient Instructions (Addendum)
Ms. [name] -  ? ?Great to see you! ? ?Let's check labs - I'll give you a shout with results. ? ?We'll plan from there ? ?Thanks, ? ?Rich  ? ?If you have lab work done today you will be contacted with your lab results within the next 2 weeks.  If you have not heard from [place] then please contact us. The fastest way to get your results is to register for My Chart. ? ? ?IF you received an x-ray today, you will receive an invoice from [facility]. Please contact [facility] at [phone] with questions or concerns regarding your invoice.  ? ?IF you received labwork today, you will receive an invoice from [place]. Please contact [facility] at [phone] with questions or concerns regarding your invoice.  ? ?Our billing staff will not be able to assist you with questions regarding bills from these companies. ? ?You will be contacted with the lab results as soon as they are available. The fastest way to get your results is to activate your My Chart account. Instructions are located on the last page of this paperwork. If you have not heard from [place] regarding the results in 2 weeks, please contact this office. ?  ? ? ?

## 2022-03-08 NOTE — Progress Notes (Signed)
? ?Acute Office Visit ? ?Subjective:  ? ? Patient ID: [name], female    DOB: [birth year], 66 y.o.   MRN: [id number] ? ?Chief Complaint  ?Patient presents with  ? Follow-up  ?  Patient states she has been having some concerns because she has been making too many bowel movement. Patient states she feels like in the last 12 days she feels like she has made about 40 BM since then.  ? ? ?HPI ?Patient is in today for follow up  ? ?Diarrhea - onset since last visit on 02/14/22 ?We had restarted her on phentermine (has not started yet) and amlodipine at that time ?Notes her appetite has been down a little bit.  ?Some abdominal pain - epigastric. Mild. Occasional. No apparent assoc with pain .  ?No diarrhea. Soft, formed stools ?No nausea or vomiting.  ?No blood or mucus in stool. ?Notes occ dark stool - usually if it is her 4-5th movement of the day. Not black, but darker brown.  ? ?She is sleeping ok - but feeling more tired. ?She has cut back on alcohol ?She has cut back on pain medications as well. ? ?Outpatient Medications Prior to Visit  ?Medication Sig Dispense Refill  ? amLODipine (NORVASC) 5 MG tablet Take 1 tablet (5 mg total) by mouth daily. 90 tablet 3  ? DULoxetine (CYMBALTA) 60 MG capsule Take 1 capsule by mouth daily.    ? EPINEPHrine 0.3 mg/0.3 mL IJ SOAJ injection Inject 0.3 mLs (0.3 mg total) into the muscle once. 1 Device 2  ? HYDROcodone-acetaminophen (NORCO) 10-325 MG tablet Take 1 tablet by mouth 2 (two) times daily as needed.    ? metaxalone (SKELAXIN) 800 MG tablet Take 800 mg by mouth 3 (three) times daily.    ? morphine (MS CONTIN) 30 MG 12 hr tablet SMARTSIG:1 Tablet(s) By Mouth Every 12 Hours    ? phentermine 37.5 MG capsule Take 1 capsule (37.5 mg total) by mouth every morning. 90 capsule 0  ? SYMPROIC 0.2 MG TABS Take 1 tablet by mouth daily.    ? traZODone (DESYREL) 100 MG tablet TAKE 1 TABLET BY MOUTH EVERYDAY AT BEDTIME 90 tablet 3  ? ?No facility-administered medications prior to visit.   ? ? ?Review of Systems  ?Constitutional: Negative.   ?HENT: Negative.    ?Eyes: Negative.   ?Respiratory: Negative.    ?Cardiovascular: Negative.   ?Gastrointestinal: Negative.   ?Genitourinary: Negative.   ?Musculoskeletal: Negative.   ?Skin: Negative.   ?Neurological: Negative.   ?Psychiatric/Behavioral: Negative.    ?All other systems reviewed and are negative. ? ?   ?Objective:  ?  ?BP (!) 138/92   Pulse 68   Temp 98.2 ?F (36.8 ?C) (Temporal)   Resp 18   Ht 5' 5.25" (1.657 m)   Wt 265 lb 3.2 oz (120.3 kg)   SpO2 94%   BMI 43.79 kg/m?  ?Physical Exam ?Vitals and nursing note reviewed.  ?Constitutional:   ?   General: She is not in acute distress. ?   Appearance: Normal appearance. She is normal weight. She is not ill-appearing, toxic-appearing or diaphoretic.  ?Cardiovascular:  ?   Rate and Rhythm: Normal rate and regular rhythm.  ?   Heart sounds: Normal heart sounds. No murmur heard. ?  No friction rub. No gallop.  ?Pulmonary:  ?   Effort: Pulmonary effort is normal. No respiratory distress.  ?   Breath sounds: Normal breath sounds. No stridor. No wheezing, rhonchi or rales.  ?Chest:  ?  Chest wall: No tenderness.  ?Abdominal:  ?   General: Abdomen is flat. Bowel sounds are normal. There is no distension.  ?   Palpations: Abdomen is soft. There is no mass.  ?   Tenderness: There is no abdominal tenderness. There is no right CVA tenderness, left CVA tenderness, guarding or rebound.  ?   Hernia: No hernia is present.  ?Skin: ?   General: Skin is warm and dry.  ?Neurological:  ?   General: No focal deficit present.  ?   Mental Status: She is alert and oriented to person, place, and time. Mental status is at baseline.  ?Psychiatric:     ?   Mood and Affect: Mood normal.     ?   Behavior: Behavior normal.     ?   Thought Content: Thought content normal.     ?   Judgment: Judgment normal.  ? ? ?No results found for any visits on 03/08/22. ? ? ?   ?Assessment & Plan:  ?1. Frequent bowel movements ?- CBC with  Differential/Platelet ?- Comprehensive metabolic panel ?- TSH ?- Hemoglobin A1c ?- Urinalysis, Routine w reflex microscopic ?- C-reactive protein ?- Sedimentation rate ? ?2. Dark stools ?- CBC with Differential/Platelet ?- Comprehensive metabolic panel ?- TSH ?- Hemoglobin A1c ?- Urinalysis, Routine w reflex microscopic ?- C-reactive protein ?- Sedimentation rate ? ?3. History of gastric bypass ?- B12 and Folate Panel ?- Vitamin D (25 hydroxy) ? ? ? ?No orders of the defined types were placed in this encounter. ? ? ?Return if symptoms worsen or fail to improve. ? ?PLAN ?Labs collected. Will follow up with the patient as warranted. ?Low threshold for CT scan abd/pel ?Can consider prn imodium use.  ?Patient encouraged to call clinic with any questions, comments, or concerns. ? ?Janeece Agee, NP ?

## 2022-03-23 NOTE — Telephone Encounter (Signed)
This was addressed at a visit

## 2022-04-08 ENCOUNTER — Telehealth: Payer: Self-pay | Admitting: Registered Nurse

## 2022-04-08 NOTE — Telephone Encounter (Signed)
Pt called to get her lab results

## 2022-04-21 NOTE — Telephone Encounter (Signed)
No worries. As long as she's improved that's ok. We can recheck when I see her in August.  Thanks,  Rich

## 2022-05-05 ENCOUNTER — Other Ambulatory Visit: Payer: Self-pay

## 2022-05-05 ENCOUNTER — Telehealth: Payer: Self-pay | Admitting: Registered Nurse

## 2022-05-05 DIAGNOSIS — F5104 Psychophysiologic insomnia: Secondary | ICD-10-CM

## 2022-05-05 DIAGNOSIS — I10 Essential (primary) hypertension: Secondary | ICD-10-CM

## 2022-05-05 MED ORDER — TRAZODONE HCL 100 MG PO TABS: ORAL_TABLET | ORAL | 3 refills | Status: DC

## 2022-05-05 MED ORDER — AMLODIPINE BESYLATE 5 MG PO TABS: 5.0000 mg | ORAL_TABLET | Freq: Every day | ORAL | 3 refills | Status: DC

## 2022-05-05 NOTE — Telephone Encounter (Signed)
Encourage patient to contact the pharmacy for refills or they can request refills through [facility]  (Please schedule appointment if patient has not been seen in over a year)    WHAT PHARMACY WOULD THEY LIKE THIS SENT TO: [facility] [phone]  MEDICATION NAME & DOSE: trazadone 100 mg, phentermine 37.5 mg, amlodipine 5 mg  NOTES/COMMENTS FROM PATIENT:      Front office please notify patient: It takes 48-72 hours to process rx refill requests Ask patient to call pharmacy to ensure rx is ready before heading there.

## 2022-05-06 ENCOUNTER — Other Ambulatory Visit: Payer: Self-pay | Admitting: Registered Nurse

## 2022-05-06 NOTE — Telephone Encounter (Signed)
Patient is requesting a refill of the following medications: Requested Prescriptions   Pending Prescriptions Disp Refills   phentermine 37.5 MG capsule [Pharmacy Med Name: PHENTERMINE 37.5 MG CAPSULE] 90 capsule 0    Sig: TAKE 1 CAPSULE BY MOUTH EVERY MORNING (NOT COVERED)    Date of patient request: 05/06/22 Last office visit: 03/08/22 Date of last refill: 02/14/22 Last refill amount: 90

## 2022-05-18 ENCOUNTER — Other Ambulatory Visit: Payer: Self-pay | Admitting: Registered Nurse

## 2022-05-18 MED ORDER — PHENTERMINE HCL 37.5 MG PO CAPS: ORAL_CAPSULE | ORAL | 0 refills | Status: DC

## 2022-05-18 NOTE — Telephone Encounter (Signed)
We should have her take a break off of this as we do note effect wanes with consistent use. Can resume again in a couple of months. She should continue healthy diet and exercise as tolerated  Thanks,  Rich

## 2022-05-18 NOTE — Telephone Encounter (Signed)
Spoke w/ pt and advised per Gerlene Burdock that she needs to stop the medication for awhile . Pt states she has not been taking the medication in two months now . She wanted a Rx to have when Richard leaves .

## 2022-05-25 ENCOUNTER — Ambulatory Visit: Payer: BC Managed Care – PPO | Admitting: Nurse Practitioner

## 2022-06-02 ENCOUNTER — Other Ambulatory Visit (HOSPITAL_COMMUNITY)
Admission: RE | Admit: 2022-06-02 | Discharge: 2022-06-02 | Disposition: A | Discharge status: Home / Self Care | Payer: BC Managed Care – PPO | Source: Ambulatory Visit | Attending: Nurse Practitioner | Admitting: Nurse Practitioner

## 2022-06-02 ENCOUNTER — Ambulatory Visit (INDEPENDENT_AMBULATORY_CARE_PROVIDER_SITE_OTHER): Payer: BC Managed Care – PPO | Admitting: Nurse Practitioner

## 2022-06-02 ENCOUNTER — Encounter: Payer: Self-pay | Admitting: Nurse Practitioner

## 2022-06-02 VITALS — BP 148/88 | HR 73 | Ht 65.5 in | Wt 256.0 lb

## 2022-06-02 DIAGNOSIS — R87810 Cervical high risk human papillomavirus (HPV) DNA test positive: Secondary | ICD-10-CM | POA: Diagnosis present

## 2022-06-02 DIAGNOSIS — Z780 Asymptomatic menopausal state: Secondary | ICD-10-CM | POA: Diagnosis not present

## 2022-06-02 DIAGNOSIS — Z01419 Encounter for gynecological examination (general) (routine) without abnormal findings: Secondary | ICD-10-CM

## 2022-06-02 NOTE — Progress Notes (Signed)
   [name] 1956/02/03 [id number]   History:  66 y.o. G0 presents for annual exam. Postmenopausal - no HRT, no bleeding. 2018 ASCUS neg HR HPV, 2022 normal cytology + HR HPV. HTN, anxiety managed by PCP. History of gastric bypass surgery 2002. Down 20 pounds since last year. Prescribed Phentermine but she does not like to take.   Gynecologic History No LMP recorded. Patient is postmenopausal.   Contraception/Family planning: post menopausal status Sexually active: No  Health Maintenance Last Pap: 05/12/2021. Results were: Normal cytology + HR HPV Last mammogram: 09/07/2021. Results were: Normal after follow up imaging Last colonoscopy: 07/16/2021. Results were: Normal, 10-year recall Last Dexa: 05/25/2021. Results were: Normal  Past medical history, past surgical history, family history and social history were all reviewed and documented in the EPIC chart. Married. 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   ROS:  A ROS was performed and pertinent positives and negatives are included.  Exam:  Vitals:   06/02/22 1518  BP: (!) 148/88  Pulse: 73  SpO2: 93%  Weight: 256 lb (116.1 kg)  Height: 5' 5.5" (1.664 m)    Body mass index is 41.95 kg/m.  General appearance:  Normal Thyroid:  Symmetrical, normal in size, without palpable masses or nodularity. Respiratory  Auscultation:  Clear without wheezing or rhonchi Cardiovascular  Auscultation:  Regular rate, without rubs, murmurs or gallops  Edema/varicosities:  Not grossly evident Abdominal  Soft,nontender, without masses, guarding or rebound.  Liver/spleen:  No organomegaly noted  Hernia:  None appreciated  Skin  Inspection:  Grossly normal Breasts: Examined lying and sitting.   Right: Without masses, retractions, nipple discharge or axillary adenopathy.   Left: Without masses, retractions, nipple discharge or axillary adenopathy. Genitourinary   Inguinal/mons:  Normal without inguinal  adenopathy  External genitalia:  Normal appearing vulva with no masses, tenderness, or lesions  BUS/Urethra/Skene's glands:  Normal  Vagina:  Normal appearing with normal color and discharge, no lesions. Atrophic changes  Cervix:  Normal appearing without discharge or lesions  Uterus:  Difficult to palpate due to body habitus but no gross masses or tenderness  Adnexa/parametria:     Rt: Normal in size, without masses or tenderness.   Lt: Normal in size, without masses or tenderness.  Anus and perineum: Normal  Digital rectal exam: Normal sphincter tone without palpated masses or tenderness  Patient informed chaperone available to be present for breast and pelvic exam. Patient has requested no chaperone to be present. Patient has been advised what will be completed during breast and pelvic exam.   Assessment/Plan:  66 y.o. G0 for annual exam.   Well female exam with routine gynecological exam - Plan: Cytology - PAP( [place]). Education provided on SBEs, importance of preventative screenings, current guidelines, high calcium diet, regular exercise, and multivitamin daily. Labs with PCP.   Postmenopausal - No HRT, no bleeding.   Screening for cervical cancer - 2018 ASCUS neg HR HPV, 2022 normal cytology + HR HPV. Pap with HR HPV today.   Screening for breast cancer - Normal mammogram history.  Continue annual screenings.  Normal breast exam today.  Screening for colon cancer - 2022 colonoscopy. Will repeat at 10-year interval per GI recommendations.   Screening for osteoporosis - Normal bone density last year.   Return in 1 year for annual.      Olivia Mackie DNP, 3:34 PM 06/02/2022

## 2022-06-07 ENCOUNTER — Encounter: Payer: BC Managed Care – PPO | Admitting: Registered Nurse

## 2022-06-07 LAB — CYTOLOGY - PAP
Comment: NEGATIVE
Diagnosis: NEGATIVE
High risk HPV: NEGATIVE

## 2022-07-12 NOTE — Progress Notes (Unsigned)
NEW PATIENT ESTABLISH CARE  Assessment and Plan:   Hypertension Well controlled with current medications  Monitor blood pressure at home; patient to call if consistently greater than 130/80 Continue DASH diet.   Reminder to go to the ER if any CP, SOB, nausea, dizziness, severe HA, changes vision/speech, left arm numbness and tingling and jaw pain.  Cholesterol Currently at goal;  Continue low cholesterol diet and exercise.  Check lipid panel.   Diabetes {with or without complications:30421263} Continue medication: Continue diet and exercise.  Perform daily foot/skin check, notify office of any concerning changes.  Check A1C  Obesity with co morbidities Long discussion about weight loss, diet, and exercise Recommended diet heavy in fruits and veggies and low in animal meats, cheeses, and dairy products, appropriate calorie intake Discussed ideal weight for height (below ***) and initial weight goal (***) Patient will work on *** Will follow up in 3 months  Vitamin D Def At goal at last visit; continue supplementation to maintain goal of 60-100 Defer Vit D level  Continue diet and meds as discussed. Further disposition pending results of labs. Discussed med's effects and SE's.   Over 30 minutes of exam, counseling, chart review, and critical decision making was performed.   Future Appointments  Date Time Provider Department Center  07/13/2022  2:30 PM Raynelle Dick, NP [facility] None    ----------------------------------------------------------------------------------------------------------------------  HPI 66 y.o. female  presents for 3 month follow up on hypertension, cholesterol, diabetes, weight and vitamin D deficiency.   BMI is There is no height or weight on file to calculate BMI., she {HAS HAS QZR:00762} been working on diet and exercise. Wt Readings from Last 3 Encounters:  06/02/22 256 lb (116.1 kg)  03/08/22 265 lb 3.2 oz (120.3 kg)  02/14/22 267 lb  3.2 oz (121.2 kg)    Her blood pressure {HAS HAS NOT:18834} been controlled at home, today their BP is    She {DOES_DOES UQJ:33545} workout. She denies chest pain, shortness of breath, dizziness.   She {ACTION; IS/IS GYB:63893734} on cholesterol medication {Cholesterol meds:21887} and denies myalgias. Her cholesterol {ACTION; IS/IS NOT:21021397} at goal. The cholesterol last visit was:   Lab Results  Component Value Date   CHOL 152 02/14/2019   HDL 72 02/14/2019   LDLCALC 65 02/14/2019   TRIG 76 02/14/2019   CHOLHDL 2.1 02/14/2019    She {Has/has not:18111} been working on diet and exercise for prediabetes, and denies {Symptoms; diabetes w/o none:19199}. Last A1C in the office was:  Lab Results  Component Value Date   HGBA1C 5.4 03/08/2022   Patient is on Vitamin D supplement.   Lab Results  Component Value Date   VD25OH 36.85 03/08/2022        Current Medications:  Current Outpatient Medications on File Prior to Visit  Medication Sig   amLODipine (NORVASC) 5 MG tablet Take 1 tablet (5 mg total) by mouth daily.   DULoxetine (CYMBALTA) 60 MG capsule Take 1 capsule by mouth daily.   EPINEPHrine 0.3 mg/0.3 mL IJ SOAJ injection Inject 0.3 mLs (0.3 mg total) into the muscle once.   HYDROcodone-acetaminophen (NORCO) 10-325 MG tablet Take 1 tablet by mouth 2 (two) times daily as needed.   metaxalone (SKELAXIN) 800 MG tablet Take 800 mg by mouth 3 (three) times daily.   morphine (MS CONTIN) 30 MG 12 hr tablet SMARTSIG:1 Tablet(s) By Mouth Every 12 Hours   phentermine 37.5 MG capsule TAKE 1 CAPSULE BY MOUTH EVERY MORNING (NOT COVERED) (Patient not taking: Reported  on 06/02/2022)   SYMPROIC 0.2 MG TABS Take 1 tablet by mouth daily.   traZODone (DESYREL) 100 MG tablet TAKE 1 TABLET BY MOUTH EVERYDAY AT BEDTIME   No current facility-administered medications on file prior to visit.     Allergies: No Known Allergies   Medical History:  Past Medical History:  Diagnosis Date    Allergy    Anemia    Anxiety    Arthritis    Depression    Fibromyalgia    HTN (hypertension)    MRSA (methicillin resistant Staphylococcus aureus)    Obese    Plantar fasciitis    Pneumonia    Seasonal allergies    Sleep apnea 2012   took test in [place]-told to use cpap-could not ude   Family history- Reviewed and unchanged Social history- Reviewed and unchanged   Review of Systems:  ROS    Physical Exam: There were no vitals taken for this visit. Wt Readings from Last 3 Encounters:  06/02/22 256 lb (116.1 kg)  03/08/22 265 lb 3.2 oz (120.3 kg)  02/14/22 267 lb 3.2 oz (121.2 kg)   General Appearance: Well nourished, in no apparent distress. Eyes: PERRLA, EOMs, conjunctiva no swelling or erythema Sinuses: No Frontal/maxillary tenderness ENT/Mouth: Ext aud canals clear, TMs without erythema, bulging. No erythema, swelling, or exudate on post pharynx.  Tonsils not swollen or erythematous. Hearing normal.  Neck: Supple, thyroid normal.  Respiratory: Respiratory effort normal, BS equal bilaterally without rales, rhonchi, wheezing or stridor.  Cardio: RRR with no MRGs. Brisk peripheral pulses without edema.  Abdomen: Soft, + BS.  Non tender, no guarding, rebound, hernias, masses. Lymphatics: Non tender without lymphadenopathy.  Musculoskeletal: Full ROM, 5/5 strength, {PSY - GAIT AND STATION:22860} gait Skin: Warm, dry without rashes, lesions, ecchymosis.  Neuro: Cranial nerves intact. No cerebellar symptoms.  Psych: Awake and oriented X 3, normal affect, Insight and Judgment appropriate.    Alycia Rossetti, NP 9:57 AM [facility]

## 2022-07-13 ENCOUNTER — Ambulatory Visit (INDEPENDENT_AMBULATORY_CARE_PROVIDER_SITE_OTHER): Payer: Medicare Other | Admitting: Nurse Practitioner

## 2022-07-13 ENCOUNTER — Ambulatory Visit: Payer: Medicare Other | Admitting: Nurse Practitioner

## 2022-07-13 ENCOUNTER — Encounter: Payer: Self-pay | Admitting: Nurse Practitioner

## 2022-07-13 VITALS — BP 138/90 | HR 87 | Temp 97.5°F | Ht 65.25 in | Wt 249.4 lb

## 2022-07-13 DIAGNOSIS — F32A Depression, unspecified: Secondary | ICD-10-CM

## 2022-07-13 DIAGNOSIS — M797 Fibromyalgia: Secondary | ICD-10-CM

## 2022-07-13 DIAGNOSIS — N1831 Chronic kidney disease, stage 3a: Secondary | ICD-10-CM

## 2022-07-13 DIAGNOSIS — F411 Generalized anxiety disorder: Secondary | ICD-10-CM

## 2022-07-13 DIAGNOSIS — F1010 Alcohol abuse, uncomplicated: Secondary | ICD-10-CM

## 2022-07-13 DIAGNOSIS — R7309 Other abnormal glucose: Secondary | ICD-10-CM | POA: Diagnosis not present

## 2022-07-13 DIAGNOSIS — I10 Essential (primary) hypertension: Secondary | ICD-10-CM | POA: Diagnosis not present

## 2022-07-13 DIAGNOSIS — D649 Anemia, unspecified: Secondary | ICD-10-CM | POA: Diagnosis not present

## 2022-07-13 DIAGNOSIS — Z6841 Body Mass Index (BMI) 40.0 and over, adult: Secondary | ICD-10-CM

## 2022-07-13 DIAGNOSIS — L4050 Arthropathic psoriasis, unspecified: Secondary | ICD-10-CM | POA: Diagnosis not present

## 2022-07-13 MED ORDER — DULOXETINE HCL 60 MG PO CPEP: 60.0000 mg | ORAL_CAPSULE | Freq: Every day | ORAL | 2 refills | Status: DC

## 2022-07-14 LAB — CBC WITH DIFFERENTIAL/PLATELET
Absolute Monocytes: 611 cells/uL (ref 200–950)
Basophils Absolute: 28 cells/uL (ref 0–200)
Basophils Relative: 0.5 %
Eosinophils Absolute: 341 cells/uL (ref 15–500)
Eosinophils Relative: 6.2 %
HCT: 37.5 % (ref 35.0–45.0)
Hemoglobin: 12.3 g/dL (ref 11.7–15.5)
Lymphs Abs: 1639 cells/uL (ref 850–3900)
MCH: 30.8 pg (ref 27.0–33.0)
MCHC: 32.8 g/dL (ref 32.0–36.0)
MCV: 93.8 fL (ref 80.0–100.0)
MPV: 10 fL (ref 7.5–12.5)
Monocytes Relative: 11.1 %
Neutro Abs: 2882 cells/uL (ref 1500–7800)
Neutrophils Relative %: 52.4 %
Platelets: 264 10*3/uL (ref 140–400)
RBC: 4 10*6/uL (ref 3.80–5.10)
RDW: 13.9 % (ref 11.0–15.0)
Total Lymphocyte: 29.8 %
WBC: 5.5 10*3/uL (ref 3.8–10.8)

## 2022-07-14 LAB — COMPLETE METABOLIC PANEL WITH GFR
AG Ratio: 2.2 (calc) (ref 1.0–2.5)
ALT: 11 U/L (ref 6–29)
AST: 19 U/L (ref 10–35)
Albumin: 4.6 g/dL (ref 3.6–5.1)
Alkaline phosphatase (APISO): 61 U/L (ref 37–153)
BUN: 17 mg/dL (ref 7–25)
CO2: 31 mmol/L (ref 20–32)
Calcium: 9.6 mg/dL (ref 8.6–10.4)
Chloride: 98 mmol/L (ref 98–110)
Creat: 1.02 mg/dL (ref 0.50–1.05)
Globulin: 2.1 g/dL (calc) (ref 1.9–3.7)
Glucose, Bld: 91 mg/dL (ref 65–99)
Potassium: 4 mmol/L (ref 3.5–5.3)
Sodium: 138 mmol/L (ref 135–146)
Total Bilirubin: 0.5 mg/dL (ref 0.2–1.2)
Total Protein: 6.7 g/dL (ref 6.1–8.1)
eGFR: 61 mL/min/{1.73_m2} (ref >=60)

## 2022-07-14 LAB — IRON, TOTAL/TOTAL IRON BINDING CAP
%SAT: 28 % (calc) (ref 16–45)
Iron: 103 ug/dL (ref 45–160)
TIBC: 365 mcg/dL (calc) (ref 250–450)

## 2022-07-14 LAB — FERRITIN: Ferritin: 24 ng/mL (ref 16–288)

## 2022-07-21 DIAGNOSIS — M25551 Pain in right hip: Secondary | ICD-10-CM | POA: Diagnosis not present

## 2022-07-21 DIAGNOSIS — M62831 Muscle spasm of calf: Secondary | ICD-10-CM | POA: Diagnosis not present

## 2022-07-21 DIAGNOSIS — G894 Chronic pain syndrome: Secondary | ICD-10-CM | POA: Diagnosis not present

## 2022-07-21 DIAGNOSIS — M150 Primary generalized (osteo)arthritis: Secondary | ICD-10-CM | POA: Diagnosis not present

## 2022-08-10 DIAGNOSIS — L4050 Arthropathic psoriasis, unspecified: Secondary | ICD-10-CM | POA: Diagnosis not present

## 2022-08-17 ENCOUNTER — Other Ambulatory Visit (HOSPITAL_BASED_OUTPATIENT_CLINIC_OR_DEPARTMENT_OTHER): Payer: Self-pay

## 2022-08-17 ENCOUNTER — Other Ambulatory Visit (HOSPITAL_COMMUNITY): Payer: Self-pay

## 2022-08-17 DIAGNOSIS — G894 Chronic pain syndrome: Secondary | ICD-10-CM | POA: Diagnosis not present

## 2022-08-17 DIAGNOSIS — M25551 Pain in right hip: Secondary | ICD-10-CM | POA: Diagnosis not present

## 2022-08-17 DIAGNOSIS — M150 Primary generalized (osteo)arthritis: Secondary | ICD-10-CM | POA: Diagnosis not present

## 2022-08-17 DIAGNOSIS — M62831 Muscle spasm of calf: Secondary | ICD-10-CM | POA: Diagnosis not present

## 2022-08-17 MED ORDER — MORPHINE SULFATE ER 30 MG PO TBCR
30.0000 mg | EXTENDED_RELEASE_TABLET | Freq: Three times a day (TID) | ORAL | 0 refills | Status: DC
  Filled 2022-08-17: qty 90, 30d supply, fill #0

## 2022-09-07 DIAGNOSIS — L4050 Arthropathic psoriasis, unspecified: Secondary | ICD-10-CM | POA: Diagnosis not present

## 2022-09-13 DIAGNOSIS — Z1231 Encounter for screening mammogram for malignant neoplasm of breast: Secondary | ICD-10-CM | POA: Diagnosis not present

## 2022-09-13 LAB — HM MAMMOGRAPHY

## 2022-09-14 ENCOUNTER — Encounter: Payer: Self-pay | Admitting: Internal Medicine

## 2022-09-21 DIAGNOSIS — Z780 Asymptomatic menopausal state: Secondary | ICD-10-CM | POA: Diagnosis not present

## 2022-09-21 LAB — HM DEXA SCAN: HM Dexa Scan: NORMAL

## 2022-09-26 ENCOUNTER — Other Ambulatory Visit (HOSPITAL_COMMUNITY): Payer: Self-pay

## 2022-09-26 MED ORDER — MORPHINE SULFATE ER 30 MG PO TBCR
30.0000 mg | EXTENDED_RELEASE_TABLET | Freq: Three times a day (TID) | ORAL | 0 refills | Status: DC
  Filled 2022-09-26: qty 90, 30d supply, fill #0

## 2022-09-26 MED ORDER — METAXALONE 800 MG PO TABS
800.0000 mg | ORAL_TABLET | Freq: Three times a day (TID) | ORAL | 1 refills | Status: DC | PRN
  Filled 2022-09-26: qty 90, 30d supply, fill #0
  Filled 2022-10-20: qty 90, 30d supply, fill #1

## 2022-09-27 ENCOUNTER — Other Ambulatory Visit (HOSPITAL_COMMUNITY): Payer: Self-pay

## 2022-10-04 ENCOUNTER — Telehealth: Payer: Self-pay

## 2022-10-04 NOTE — Telephone Encounter (Signed)
-----   Message from Adela Glimpse, NP sent at 10/02/2022 11:51 PM EST ----- Regarding: DEXA Results Please let patient know that Bone Density test performed on 09/21/22 reveals a T-Score of -0.9 which is normal.  We will continue to re-screen every 2 years.

## 2022-10-04 NOTE — Telephone Encounter (Signed)
LMOM to call back

## 2022-10-05 ENCOUNTER — Encounter: Payer: Self-pay | Admitting: Registered Nurse

## 2022-10-05 DIAGNOSIS — Z1159 Encounter for screening for other viral diseases: Secondary | ICD-10-CM | POA: Diagnosis not present

## 2022-10-05 DIAGNOSIS — L4050 Arthropathic psoriasis, unspecified: Secondary | ICD-10-CM | POA: Diagnosis not present

## 2022-10-06 ENCOUNTER — Encounter: Payer: Self-pay | Admitting: Registered Nurse

## 2022-10-13 ENCOUNTER — Other Ambulatory Visit (HOSPITAL_COMMUNITY): Payer: Self-pay

## 2022-10-13 DIAGNOSIS — M62831 Muscle spasm of calf: Secondary | ICD-10-CM | POA: Diagnosis not present

## 2022-10-13 DIAGNOSIS — G894 Chronic pain syndrome: Secondary | ICD-10-CM | POA: Diagnosis not present

## 2022-10-13 DIAGNOSIS — M25551 Pain in right hip: Secondary | ICD-10-CM | POA: Diagnosis not present

## 2022-10-13 DIAGNOSIS — M150 Primary generalized (osteo)arthritis: Secondary | ICD-10-CM | POA: Diagnosis not present

## 2022-10-13 MED ORDER — MORPHINE SULFATE ER 30 MG PO TBCR
30.0000 mg | EXTENDED_RELEASE_TABLET | Freq: Three times a day (TID) | ORAL | 0 refills | Status: DC
  Filled 2022-12-22: qty 90, 30d supply, fill #0

## 2022-10-13 MED ORDER — MORPHINE SULFATE ER 30 MG PO TBCR
30.0000 mg | EXTENDED_RELEASE_TABLET | Freq: Three times a day (TID) | ORAL | 0 refills | Status: DC
  Filled 2022-10-25: qty 90, 30d supply, fill #0

## 2022-10-13 MED ORDER — METAXALONE 800 MG PO TABS: 800.0000 mg | ORAL_TABLET | Freq: Three times a day (TID) | ORAL | 1 refills | Status: DC | PRN

## 2022-10-19 ENCOUNTER — Encounter: Payer: Medicare Other | Admitting: Nurse Practitioner

## 2022-10-20 ENCOUNTER — Other Ambulatory Visit (HOSPITAL_COMMUNITY): Payer: Self-pay

## 2022-10-20 ENCOUNTER — Encounter: Payer: Medicare Other | Admitting: Nurse Practitioner

## 2022-10-25 ENCOUNTER — Other Ambulatory Visit (HOSPITAL_COMMUNITY): Payer: Self-pay

## 2022-10-28 ENCOUNTER — Other Ambulatory Visit (HOSPITAL_COMMUNITY): Payer: Self-pay

## 2022-11-03 DIAGNOSIS — M549 Dorsalgia, unspecified: Secondary | ICD-10-CM | POA: Diagnosis not present

## 2022-11-03 DIAGNOSIS — Z79899 Other long term (current) drug therapy: Secondary | ICD-10-CM | POA: Diagnosis not present

## 2022-11-03 DIAGNOSIS — L4050 Arthropathic psoriasis, unspecified: Secondary | ICD-10-CM | POA: Diagnosis not present

## 2022-11-03 DIAGNOSIS — M25559 Pain in unspecified hip: Secondary | ICD-10-CM | POA: Diagnosis not present

## 2022-11-03 DIAGNOSIS — M79644 Pain in right finger(s): Secondary | ICD-10-CM | POA: Diagnosis not present

## 2022-11-03 DIAGNOSIS — M2550 Pain in unspecified joint: Secondary | ICD-10-CM | POA: Diagnosis not present

## 2022-11-03 DIAGNOSIS — M797 Fibromyalgia: Secondary | ICD-10-CM | POA: Diagnosis not present

## 2022-11-03 DIAGNOSIS — Z872 Personal history of diseases of the skin and subcutaneous tissue: Secondary | ICD-10-CM | POA: Diagnosis not present

## 2022-11-03 DIAGNOSIS — M1990 Unspecified osteoarthritis, unspecified site: Secondary | ICD-10-CM | POA: Diagnosis not present

## 2022-11-08 DIAGNOSIS — L4050 Arthropathic psoriasis, unspecified: Secondary | ICD-10-CM | POA: Diagnosis not present

## 2022-11-22 ENCOUNTER — Encounter: Payer: Self-pay | Admitting: Nurse Practitioner

## 2022-11-22 ENCOUNTER — Ambulatory Visit (INDEPENDENT_AMBULATORY_CARE_PROVIDER_SITE_OTHER): Payer: HMO | Admitting: Nurse Practitioner

## 2022-11-22 VITALS — BP 132/84 | HR 79 | Temp 97.5°F | Ht 65.5 in | Wt 272.6 lb

## 2022-11-22 DIAGNOSIS — R7309 Other abnormal glucose: Secondary | ICD-10-CM

## 2022-11-22 DIAGNOSIS — G4709 Other insomnia: Secondary | ICD-10-CM

## 2022-11-22 DIAGNOSIS — F411 Generalized anxiety disorder: Secondary | ICD-10-CM

## 2022-11-22 DIAGNOSIS — F1010 Alcohol abuse, uncomplicated: Secondary | ICD-10-CM

## 2022-11-22 DIAGNOSIS — F423 Hoarding disorder: Secondary | ICD-10-CM

## 2022-11-22 DIAGNOSIS — L4050 Arthropathic psoriasis, unspecified: Secondary | ICD-10-CM

## 2022-11-22 DIAGNOSIS — Z0000 Encounter for general adult medical examination without abnormal findings: Secondary | ICD-10-CM | POA: Diagnosis not present

## 2022-11-22 DIAGNOSIS — D649 Anemia, unspecified: Secondary | ICD-10-CM

## 2022-11-22 DIAGNOSIS — Z136 Encounter for screening for cardiovascular disorders: Secondary | ICD-10-CM

## 2022-11-22 DIAGNOSIS — M797 Fibromyalgia: Secondary | ICD-10-CM

## 2022-11-22 DIAGNOSIS — F32A Depression, unspecified: Secondary | ICD-10-CM

## 2022-11-22 DIAGNOSIS — N1831 Chronic kidney disease, stage 3a: Secondary | ICD-10-CM

## 2022-11-22 DIAGNOSIS — I10 Essential (primary) hypertension: Secondary | ICD-10-CM

## 2022-11-22 DIAGNOSIS — Z0001 Encounter for general adult medical examination with abnormal findings: Secondary | ICD-10-CM

## 2022-11-22 NOTE — Progress Notes (Signed)
CPE  Assessment and Plan:   [name] was seen today for a CPE.  Diagnoses and all orders for this visit:  Annual Physical Due annually Health maintenance reviewed  Essential hypertension Discussed DASH (Dietary Approaches to Stop Hypertension) DASH diet is lower in sodium than a typical American diet. Cut back on foods that are high in saturated fat, cholesterol, and trans fats. Eat more whole-grain foods, fish, poultry, and nuts Remain active and exercise as tolerated daily.  Monitor BP at home-Call if greater than 130/80.   Morbid obesity with BMI of 40.0-44.9, adult ([place]) Discussed Zepbound Discussed appropriate BMI Diet modification. Physical activity. Encouraged/praised to build confidence.   Anxiety state/Depressive disorder Continue Cymbalta Reviewed relaxation techniques.  Sleep hygiene. Recommended Cognitive Behavioral Therapy (CBT). Recommended mindfulness meditation and exercise.   Insight-oriented psychotherapy given for 16 minutes exclusively. Psychoeducation:  encouraged personality growth wand development through coping techniques and problem-solving skills. Limit/Decrease/Monitor drug/alcohol intake.     Chronic anemia Monitor CBC, anemia panel  Psoriatic arthritis (HCC) Continue medication and follow with rheumatology  Fibromyalgia Continue to follow with pain management Increase exercise as tolerated  Stage 3a chronic kidney disease ([place]) Discussed how what you eat and drink can aide in kidney protection. Stay well hydrated. Avoid high salt foods. Avoid NSAIDS. Keep BP and BG well controlled.   Take medications as prescribed. Remain active and exercise as tolerated daily. Maintain weight.  Continue to monitor.  Abnormal glucose Education: Reviewed 'ABCs' of diabetes management  Discussed goals to be met and/or maintained include A1C (<7) Blood pressure (<130/80) Cholesterol (LDL <70) Continue Eye Exam yearly  Continue Dental Exam Q6  mo Discussed dietary recommendations Discussed Physical Activity recommendations  Alcohol abuse Currently drinking 2-3 beers a day, this is decreased from previous use Alcohol cessation instruction/counseling given:    Other insomnia Likely alcohol related Discussed good sleep hygiene. Establish bed and wake times. Sleep restriction-only sleep estimated hrs sleep. Bed only for sex and sleep, only sleep when sleepy, out of bed if anxious (stimulus control). Reviewed relaxation techniques, mindful meditations. Expected sleep duration. Addressed worries about not sleeping.   Patient defers labs at this time - wishes to completed Q6 mo.    Schedule Wellness Visit  Orders Placed This Encounter  Procedures   EKG 12-Lead   Notify office for further evaluation and treatment, questions or concerns if any reported s/s fail to improve.   The patient was advised to call back or seek an in-person evaluation if any symptoms worsen or if the condition fails to improve as anticipated.   Further disposition pending results of labs. Discussed med's effects and SE's.    I discussed the assessment and treatment plan with the patient. The patient was provided an opportunity to ask questions and all were answered. The patient agreed with the plan and demonstrated an understanding of the instructions.  Discussed med's effects and SE's. Screening labs and tests as requested with regular follow-up as recommended.  I provided 35 minutes of face-to-face time during this encounter including counseling, chart review, and critical decision making was preformed.  ---------------------------------------------------------------------------------------------------------------------  HPI 67 y.o. female  presents for new general CPE.  She has no new or additional concerns to report in clinic today.    She does have fibromyalgia and is followed by Dr. Hardin Negus who is pain medicine.   She is on Cymbalta for  depression and pain.  Reports effectiveness.   She used to smoke during her teens and early 20's and a pack  lasted 3 days.  She quit smoking 40 years ago.  Denies any residual effects including SOB.  She sees Dr. Isaias Sakai for psoriatic arthritis- she is on Cimza injections.  BMI is Body mass index is 44.67 kg/m., she has not been working on diet and exercise.  Wt Readings from Last 3 Encounters:  11/22/22 272 lb 9.6 oz (123.7 kg)  07/13/22 249 lb 6.4 oz (113.1 kg)  06/02/22 256 lb (116.1 kg)   She does not check her BP at home, currently on Amlodipine 5 mg QD, today their BP is BP: 132/84  BP Readings from Last 3 Encounters:  11/22/22 132/84  07/13/22 (!) 138/90  06/02/22 (!) 148/88  She does not workout. She denies chest pain, shortness of breath, dizziness.   She is not on cholesterol medication.  Her cholesterol is at goal. The cholesterol last visit was:   Lab Results  Component Value Date   CHOL 152 02/14/2019   HDL 72 02/14/2019   LDLCALC 65 02/14/2019   TRIG 76 02/14/2019   CHOLHDL 2.1 02/14/2019    She has been working on diet and exercise for abnormal glucose . Last A1C in the office was:  Lab Results  Component Value Date   HGBA1C 5.4 03/08/2022   Patient is on Vitamin D supplement.   Lab Results  Component Value Date   VD25OH 36.85 03/08/2022     She does have a history of anemia. She is no longer on an iron supplement Lab Results  Component Value Date   WBC 5.5 07/13/2022   HGB 12.3 07/13/2022   HCT 37.5 07/13/2022   MCV 93.8 07/13/2022   PLT 264 07/13/2022   Last GFR was 58.07 on 03/08/22- she has not been drinking much water.  Drinks 2-3 beers a day. She also drinks diet mountain dew.  She is trying to drink more water and limit soda intake.  Current Medications:  Current Outpatient Medications on File Prior to Visit  Medication Sig   amLODipine (NORVASC) 5 MG tablet Take 1 tablet (5 mg total) by mouth daily.   DULoxetine (CYMBALTA) 60 MG capsule Take  1 capsule (60 mg total) by mouth daily.   EPINEPHrine 0.3 mg/0.3 mL IJ SOAJ injection Inject 0.3 mLs (0.3 mg total) into the muscle once.   metaxalone (SKELAXIN) 800 MG tablet Take 800 mg by mouth 3 (three) times daily.   morphine (MS CONTIN) 30 MG 12 hr tablet Take 1 tablet (30 mg total) by mouth every 8 (eight) hours.   morphine (MS CONTIN) 30 MG 12 hr tablet Take 1 tablet (30 mg total) by mouth every 8 (eight) hours.   SYMPROIC 0.2 MG TABS Take 1 tablet by mouth daily.   traZODone (DESYREL) 100 MG tablet TAKE 1 TABLET BY MOUTH EVERYDAY AT BEDTIME   BIOTIN PO Take by mouth. (Patient not taking: Reported on 11/22/2022)   Cyanocobalamin (B-12 PO) Take by mouth. (Patient not taking: Reported on 11/22/2022)   HYDROcodone-acetaminophen (NORCO) 10-325 MG tablet Take 1 tablet by mouth 2 (two) times daily as needed. (Patient not taking: Reported on 11/22/2022)   metaxalone (SKELAXIN) 800 MG tablet Take 1 tablet (800 mg total) by mouth 3 (three) times daily as needed.   metaxalone (SKELAXIN) 800 MG tablet Take 1 tablet (800 mg total) by mouth 3 (three) times daily as needed.   morphine (MS CONTIN) 30 MG 12 hr tablet SMARTSIG:1 Tablet(s) By Mouth Every 12 Hours   morphine (MS CONTIN) 30 MG 12 hr tablet Take  1 tablet (30 mg total) by mouth every 8 (eight) hours. (fill 11/23/22)   phentermine 37.5 MG capsule TAKE 1 CAPSULE BY MOUTH EVERY MORNING (NOT COVERED) (Patient not taking: Reported on 06/02/2022)   POTASSIUM PO Take by mouth. (Patient not taking: Reported on 11/22/2022)   No current facility-administered medications on file prior to visit.     Allergies: No Known Allergies   Medical History:  Past Medical History:  Diagnosis Date   Allergy    Anemia    Anxiety    Arthritis    Depression    Fibromyalgia    HTN (hypertension)    MRSA (methicillin resistant Staphylococcus aureus)    Obese    Plantar fasciitis    Pneumonia    Seasonal allergies    Sleep apnea 2012   took test in  [place]-told to use cpap-could not ude   Family history- Reviewed and unchanged Social history- Reviewed and unchanged   Review of Systems:  Review of Systems  Constitutional:  Positive for malaise/fatigue.  HENT:  Negative for congestion and hearing loss.   Eyes:  Negative for blurred vision.  Respiratory:  Negative for cough and wheezing.   Cardiovascular:  Negative for chest pain, palpitations and orthopnea.  Gastrointestinal:  Positive for constipation (with pain meds). Negative for heartburn, nausea and vomiting.  Genitourinary:  Negative for dysuria.  Musculoskeletal:  Positive for back pain, joint pain and myalgias.  Neurological:  Negative for dizziness and headaches.  Endo/Heme/Allergies:  Does not bruise/bleed easily.  Psychiatric/Behavioral:  Positive for depression. The patient is nervous/anxious.       Physical Exam: BP 132/84   Pulse 79   Temp (!) 97.5 F (36.4 C)   Ht 5' 5.5" (1.664 m)   Wt 272 lb 9.6 oz (123.7 kg)   SpO2 98%   BMI 44.67 kg/m  Wt Readings from Last 3 Encounters:  11/22/22 272 lb 9.6 oz (123.7 kg)  07/13/22 249 lb 6.4 oz (113.1 kg)  06/02/22 256 lb (116.1 kg)   General Appearance: Very pleasant obese female, in no apparent distress. Eyes: PERRLA, EOMs, conjunctiva no swelling or erythema Sinuses: No Frontal/maxillary tenderness ENT/Mouth: Ext aud canals clear, TMs without erythema, bulging. No erythema, swelling, or exudate on post pharynx.  Tonsils not swollen or erythematous. Hearing normal.  Neck: Supple, thyroid normal.  Respiratory: Respiratory effort normal, BS equal bilaterally without rales, rhonchi, wheezing or stridor.  Cardio: RRR with no MRGs. Brisk peripheral pulses without edema.  Abdomen: Soft, + BS.  Non tender, no guarding, rebound, hernias, masses. Lymphatics: Non tender without lymphadenopathy.  Musculoskeletal: Decrease ROM of knees, 4/5 strength, Antalgic gait Skin: Warm, dry without rashes, lesions, ecchymosis.   Neuro: Cranial nerves intact. No cerebellar symptoms.  Psych: Awake and oriented X 3, normal affect, Insight and Judgment appropriate.   EKG:  Completed -  Darrol Jump, NP 8:46 PM [facility]

## 2022-11-22 NOTE — Patient Instructions (Addendum)
Schedule Wellness Visit  Zepbound for Weight Loss  Healthy Eating Following a healthy eating pattern may help you to achieve and maintain a healthy body weight, reduce the risk of chronic disease, and live a long and productive life. It is important to follow a healthy eating pattern at an appropriate calorie level for your body. Your nutritional needs should be met primarily through food by choosing a variety of nutrient-rich foods. What are tips for following this plan? Reading food labels Read labels and choose the following: Reduced or low sodium. Juices with 100% fruit juice. Foods with low saturated fats and high polyunsaturated and monounsaturated fats. Foods with whole grains, such as whole wheat, cracked wheat, brown rice, and wild rice. Whole grains that are fortified with folic acid. This is recommended for women who are pregnant or who want to become pregnant. Read labels and avoid the following: Foods with a lot of added sugars. These include foods that contain brown sugar, corn sweetener, corn syrup, dextrose, fructose, glucose, high-fructose corn syrup, honey, invert sugar, lactose, malt syrup, maltose, molasses, raw sugar, sucrose, trehalose, or turbinado sugar. Do not eat more than the following amounts of added sugar per day: 6 teaspoons (25 g) for women. 9 teaspoons (38 g) for men. Foods that contain processed or refined starches and grains. Refined grain products, such as white flour, degermed cornmeal, white bread, and white rice. Shopping Choose nutrient-rich snacks, such as vegetables, whole fruits, and nuts. Avoid high-calorie and high-sugar snacks, such as potato chips, fruit snacks, and candy. Use oil-based dressings and spreads on foods instead of solid fats such as butter, stick margarine, or cream cheese. Limit pre-made sauces, mixes, and "instant" products such as flavored rice, instant noodles, and ready-made pasta. Try more plant-protein sources, such as tofu,  tempeh, black beans, edamame, lentils, nuts, and seeds. Explore eating plans such as the Mediterranean diet or vegetarian diet. Cooking Use oil to saut or stir-fry foods instead of solid fats such as butter, stick margarine, or lard. Try baking, boiling, grilling, or broiling instead of frying. Remove the fatty part of meats before cooking. Steam vegetables in water or broth. Meal planning  At meals, imagine dividing your plate into fourths: One-half of your plate is fruits and vegetables. One-fourth of your plate is whole grains. One-fourth of your plate is protein, especially lean meats, poultry, eggs, tofu, beans, or nuts. Include low-fat dairy as part of your daily diet. Lifestyle Choose healthy options in all settings, including home, work, school, restaurants, or stores. Prepare your food safely: Wash your hands after handling raw meats. Keep food preparation surfaces clean by regularly washing with hot, soapy water. Keep raw meats separate from ready-to-eat foods, such as fruits and vegetables. Cook seafood, meat, poultry, and eggs to the recommended internal temperature. Store foods at safe temperatures. In general: Keep cold foods at 61F (4.4C) or below. Keep hot foods at 161F (60C) or above. Keep your freezer at [facility] (-17.8C) or below. Foods are no longer safe to eat when they have been between the temperatures of 40-161F (4.4-60C) for more than 2 hours. What foods should I eat? Fruits Aim to eat 2 cup-equivalents of fresh, canned (in natural juice), or frozen fruits each day. Examples of 1 cup-equivalent of fruit include 1 small apple, 8 large strawberries, 1 cup canned fruit,  cup dried fruit, or 1 cup 100% juice. Vegetables Aim to eat 2-3 cup-equivalents of fresh and frozen vegetables each day, including different varieties and colors. Examples of 1 cup-equivalent  of vegetables include 2 medium carrots, 2 cups raw, leafy greens, 1 cup chopped vegetable (raw or  cooked), or 1 medium baked potato. Grains Aim to eat 6 ounce-equivalents of whole grains each day. Examples of 1 ounce-equivalent of grains include 1 slice of bread, 1 cup ready-to-eat cereal, 3 cups popcorn, or  cup cooked rice, pasta, or cereal. Meats and other proteins Aim to eat 5-6 ounce-equivalents of protein each day. Examples of 1 ounce-equivalent of protein include 1 egg, 1/2 cup nuts or seeds, or 1 tablespoon (16 g) peanut butter. A cut of meat or fish that is the size of a deck of cards is about 3-4 ounce-equivalents. Of the protein you eat each week, try to have at least 8 ounces come from seafood. This includes salmon, trout, herring, and anchovies. Dairy Aim to eat 3 cup-equivalents of fat-free or low-fat dairy each day. Examples of 1 cup-equivalent of dairy include 1 cup (240 mL) milk, 8 ounces (250 g) yogurt, 1 ounces (44 g) natural cheese, or 1 cup (240 mL) fortified soy milk. Fats and oils Aim for about 5 teaspoons (21 g) per day. Choose monounsaturated fats, such as canola and olive oils, avocados, peanut butter, and most nuts, or polyunsaturated fats, such as sunflower, corn, and soybean oils, walnuts, pine nuts, sesame seeds, sunflower seeds, and flaxseed. Beverages Aim for six 8-oz glasses of water per day. Limit coffee to three to five 8-oz cups per day. Limit caffeinated beverages that have added calories, such as soda and energy drinks. Limit alcohol intake to no more than 1 drink a day for nonpregnant women and 2 drinks a day for men. One drink equals 12 oz of beer (355 mL), 5 oz of wine (148 mL), or 1 oz of hard liquor (44 mL). Seasoning and other foods Avoid adding excess amounts of salt to your foods. Try flavoring foods with herbs and spices instead of salt. Avoid adding sugar to foods. Try using oil-based dressings, sauces, and spreads instead of solid fats. This information is based on general [place] nutrition guidelines. For more information, visit  [employer]. Exact amounts may vary based on your nutrition needs. Summary A healthy eating plan may help you to maintain a healthy weight, reduce the risk of chronic diseases, and stay active throughout your life. Plan your meals. Make sure you eat the right portions of a variety of nutrient-rich foods. Try baking, boiling, grilling, or broiling instead of frying. Choose healthy options in all settings, including home, work, school, restaurants, or stores. This information is not intended to replace advice given to you by your health care provider. Make sure you discuss any questions you have with your health care provider. Document Revised: 03/23/2022 Document Reviewed: 06/08/2021 Elsevier Patient Education  [place].

## 2022-11-29 ENCOUNTER — Telehealth: Payer: Self-pay | Admitting: Nurse Practitioner

## 2022-11-29 NOTE — Telephone Encounter (Signed)
Pt requested a refill on Phentermine to be sent at last appt to [facility] in [facility]

## 2022-11-30 ENCOUNTER — Other Ambulatory Visit: Payer: Self-pay | Admitting: Nurse Practitioner

## 2022-11-30 MED ORDER — PHENTERMINE HCL 37.5 MG PO CAPS: ORAL_CAPSULE | ORAL | 0 refills | Status: DC

## 2022-12-06 DIAGNOSIS — L4050 Arthropathic psoriasis, unspecified: Secondary | ICD-10-CM | POA: Diagnosis not present

## 2022-12-14 ENCOUNTER — Other Ambulatory Visit (HOSPITAL_COMMUNITY): Payer: Self-pay

## 2022-12-14 DIAGNOSIS — M62831 Muscle spasm of calf: Secondary | ICD-10-CM | POA: Diagnosis not present

## 2022-12-14 DIAGNOSIS — G894 Chronic pain syndrome: Secondary | ICD-10-CM | POA: Diagnosis not present

## 2022-12-14 DIAGNOSIS — M25551 Pain in right hip: Secondary | ICD-10-CM | POA: Diagnosis not present

## 2022-12-14 DIAGNOSIS — M150 Primary generalized (osteo)arthritis: Secondary | ICD-10-CM | POA: Diagnosis not present

## 2022-12-14 MED ORDER — MORPHINE SULFATE ER 30 MG PO TBCR
30.0000 mg | EXTENDED_RELEASE_TABLET | Freq: Three times a day (TID) | ORAL | 0 refills | Status: DC
  Filled 2022-12-14: qty 15, 5d supply, fill #0
  Filled 2022-12-19 – 2022-12-21 (×2): qty 75, 25d supply, fill #1

## 2022-12-14 MED ORDER — TIZANIDINE HCL 2 MG PO TABS
2.0000 mg | ORAL_TABLET | Freq: Three times a day (TID) | ORAL | 1 refills | Status: DC | PRN
  Filled 2022-12-14: qty 90, 30d supply, fill #0
  Filled 2023-04-04: qty 90, 30d supply, fill #1

## 2022-12-14 MED ORDER — MORPHINE SULFATE ER 30 MG PO TBCR: 30.0000 mg | EXTENDED_RELEASE_TABLET | Freq: Three times a day (TID) | ORAL | 0 refills | Status: DC

## 2022-12-15 ENCOUNTER — Other Ambulatory Visit (HOSPITAL_COMMUNITY): Payer: Self-pay

## 2022-12-16 ENCOUNTER — Other Ambulatory Visit (HOSPITAL_COMMUNITY): Payer: Self-pay

## 2022-12-17 ENCOUNTER — Other Ambulatory Visit (HOSPITAL_COMMUNITY): Payer: Self-pay

## 2022-12-19 ENCOUNTER — Other Ambulatory Visit (HOSPITAL_COMMUNITY): Payer: Self-pay

## 2022-12-22 ENCOUNTER — Other Ambulatory Visit (HOSPITAL_COMMUNITY): Payer: Self-pay

## 2022-12-26 ENCOUNTER — Other Ambulatory Visit (HOSPITAL_COMMUNITY): Payer: Self-pay

## 2023-01-03 DIAGNOSIS — L4050 Arthropathic psoriasis, unspecified: Secondary | ICD-10-CM | POA: Diagnosis not present

## 2023-01-04 ENCOUNTER — Ambulatory Visit (INDEPENDENT_AMBULATORY_CARE_PROVIDER_SITE_OTHER): Payer: HMO | Admitting: Nurse Practitioner

## 2023-01-04 VITALS — BP 158/90 | HR 91 | Temp 97.7°F | Wt 277.4 lb

## 2023-01-04 DIAGNOSIS — R0789 Other chest pain: Secondary | ICD-10-CM | POA: Diagnosis not present

## 2023-01-04 DIAGNOSIS — Z6841 Body Mass Index (BMI) 40.0 and over, adult: Secondary | ICD-10-CM | POA: Diagnosis not present

## 2023-01-04 DIAGNOSIS — I872 Venous insufficiency (chronic) (peripheral): Secondary | ICD-10-CM

## 2023-01-04 DIAGNOSIS — D649 Anemia, unspecified: Secondary | ICD-10-CM | POA: Diagnosis not present

## 2023-01-04 DIAGNOSIS — R0609 Other forms of dyspnea: Secondary | ICD-10-CM | POA: Diagnosis not present

## 2023-01-04 DIAGNOSIS — L97821 Non-pressure chronic ulcer of other part of left lower leg limited to breakdown of skin: Secondary | ICD-10-CM | POA: Diagnosis not present

## 2023-01-04 DIAGNOSIS — I83028 Varicose veins of left lower extremity with ulcer other part of lower leg: Secondary | ICD-10-CM

## 2023-01-04 DIAGNOSIS — I10 Essential (primary) hypertension: Secondary | ICD-10-CM | POA: Diagnosis not present

## 2023-01-04 MED ORDER — LISINOPRIL 10 MG PO TABS: ORAL_TABLET | ORAL | 3 refills | Status: DC

## 2023-01-04 MED ORDER — HYDROCHLOROTHIAZIDE 12.5 MG PO TABS: 12.5000 mg | ORAL_TABLET | Freq: Every day | ORAL | 0 refills | Status: DC

## 2023-01-04 MED ORDER — SAXENDA 18 MG/3ML ~~LOC~~ SOPN: 0.6000 mg | PEN_INJECTOR | Freq: Every day | SUBCUTANEOUS | 3 refills | Status: DC

## 2023-01-04 NOTE — Patient Instructions (Signed)
Liraglutide Injection (Weight Management) What is this medication? LIRAGLUTIDE (LIR a GLOO tide) promotes weight loss. It may also be used to maintain weight loss. It works by decreasing appetite. Changes to diet and exercise are often combined with this medication. This medicine may be used for other purposes; ask your health care provider or pharmacist if you have questions. COMMON BRAND NAME(S): Saxenda What should I tell my care team before I take this medication? They need to know if you have any of these conditions: Endocrine tumors (MEN 2) or if someone in your family had these tumors Gallbladder disease High cholesterol History of pancreatitis Kidney disease or if you are on dialysis Liver disease Previous swelling of the tongue, face, or lips with difficulty breathing, difficulty swallowing, hoarseness, or tightening of the throat Stomach problems Substance use disorder Suicidal thoughts, plans, or attempt by you or a family member Thyroid cancer or if someone in your family had thyroid cancer An unusual or allergic reaction to liraglutide, other medications, foods, dyes, or preservatives Pregnant or trying to get pregnant Breast-feeding How should I use this medication? This medication is for injection under the skin of your upper leg, stomach area, or upper arm. You will be taught how to prepare and give this medication. Use exactly as directed. Take your medication at regular intervals. Do not take it more often than directed. This medication comes with INSTRUCTIONS FOR USE. Ask your pharmacist for directions on how to use this medication. Read the information carefully. Talk to your pharmacist or care team if you have questions. It is important that you put your used needles and syringes in a special sharps container. Do not put them in a trash can. If you do not have a sharps container, call your pharmacist or care team to get one. A special MedGuide will be given to you by the  pharmacist with each prescription and refill. Be sure to read this information carefully each time. Talk to your care team about the use of this medication in children. While it may be prescribed for children as young as 66 years of age for selected conditions, precautions do apply. Overdosage: If you think you have taken too much of this medicine contact a poison control center or emergency room at once. NOTE: This medicine is only for you. Do not share this medicine with others. What if I miss a dose? If you miss a dose, take it as soon as you can. If it is almost time for your next dose, take only that dose. Do not take double or extra doses. If you miss your dose for 3 days or more, call your care team to talk about how to restart this medicine. What may interact with this medication? Insulin and other medications for diabetes This list may not describe all possible interactions. Give your health care provider a list of all the medicines, herbs, non-prescription drugs, or dietary supplements you use. Also tell them if you smoke, drink alcohol, or use illegal drugs. Some items may interact with your medicine. What should I watch for while using this medication? Visit your care team for regular checks on your progress. Drink plenty of fluids while taking this medication. Check with your care team if you get an attack of severe diarrhea, nausea, and vomiting. The loss of too much body fluid can make it dangerous for you to take this medication. This medication may affect blood sugar levels. Ask your care team if changes in diet or medications are  needed if you have diabetes. Patients and their families should watch out for worsening depression or thoughts of suicide. Also watch out for sudden changes in feelings such as feeling anxious, agitated, panicky, irritable, hostile, aggressive, impulsive, severely restless, overly excited and hyperactive, or not being able to sleep. If this happens, especially  at the beginning of treatment or after a change in dose, call your care team. Talk to your care team if you wish to become pregnant or think you might be pregnant. Losing weight while pregnant is not advised and may cause harm to the unborn child. Talk to your care team for more information. What side effects may I notice from receiving this medication? Side effects that you should report to your care team as soon as possible: Allergic reactions or angioedema--skin rash, itching, hives, swelling of the face, eyes, lips, tongue, arms, or legs, trouble swallowing or breathing Fast or irregular heartbeat Gallbladder problems--severe stomach pain, nausea, vomiting, fever Kidney injury--decrease in the amount of urine, swelling of the ankles, hands, or feet Pancreatitis--severe stomach pain that spreads to your back or gets worse after eating or when touched, fever, nausea, vomiting Thoughts of suicide or self-harm, worsening mood, feelings of depression Thyroid cancer--new mass or lump in the neck, pain or trouble swallowing, trouble breathing, hoarseness Side effects that usually do not require medical attention (report to your care team if they continue or are bothersome): Constipation Dizziness Fatigue Headache Loss of Appetite Nausea Upset stomach This list may not describe all possible side effects. Call your doctor for medical advice about side effects. You may report side effects to FDA at 1-800-FDA-1088. Where should I keep my medication? Keep out of the reach of children and pets. Store unopened pen in a refrigerator between 2 and 8 degrees C (36 and 46 degrees F). Do not freeze or use if the medication has been frozen. Protect from light and excessive heat. After you first use the pen, it can be stored at room temperature between 15 and 30 degrees C (59 and 86 degrees F) or in a refrigerator. Throw away your used pen after 30 days or after the expiration date, whichever comes first. Do  not store your pen with the needle attached. If the needle is left on, medication may leak from the pen. NOTE: This sheet is a summary. It may not cover all possible information. If you have questions about this medicine, talk to your doctor, pharmacist, or health care provider.  2023 Elsevier/Gold Standard (2021-06-23 00:00:00)   Blood Pressure Record Sheet To take your blood pressure, you will need a blood pressure machine. You may be prescribed one, or you can buy a blood pressure machine (blood pressure monitor) at your clinic, drug store, or online. When choosing one, look for these features: An automatic monitor that has an arm cuff. A cuff that wraps snugly, but not too tightly, around your upper arm. You should be able to fit only one finger between your arm and the cuff. A device that stores blood pressure reading results. Do not choose a monitor that measures your blood pressure from your wrist or finger. Follow your health care provider's instructions for how to take your blood pressure. To use this form: Get one reading in the morning (a.m.) before you take any medicines. Get one reading in the evening (p.m.) before supper. Take at least two readings with each blood pressure check. This makes sure the results are correct. Wait 1-2 minutes between measurements. Write down the  results in the spaces on this form. Repeat this once a week, or as told by your health care provider. Make a follow-up appointment with your health care provider to discuss the results. Blood pressure log Date: _______________________ a.m. _____________________(1st reading) _____________________(2nd reading) p.m. _____________________(1st reading) _____________________(2nd reading) Date: _______________________ a.m. _____________________(1st reading) _____________________(2nd reading) p.m. _____________________(1st reading) _____________________(2nd reading) Date: _______________________ a.m.  _____________________(1st reading) _____________________(2nd reading) p.m. _____________________(1st reading) _____________________(2nd reading) Date: _______________________ a.m. _____________________(1st reading) _____________________(2nd reading) p.m. _____________________(1st reading) _____________________(2nd reading) Date: _______________________ a.m. _____________________(1st reading) _____________________(2nd reading) p.m. _____________________(1st reading) _____________________(2nd reading) This information is not intended to replace advice given to you by your health care provider. Make sure you discuss any questions you have with your health care provider. Document Revised: 06/24/2021 Document Reviewed: 06/24/2021 Elsevier Patient Education  [place].

## 2023-01-04 NOTE — Progress Notes (Signed)
Follow Up  Assessment and Plan:   Lismarie was seen today for a general follow up  Diagnoses and all orders for this visit:  Essential hypertension Elevated in clinic today - Start Lisinopril and HCTZ. Record readings on BP log and bring back to clinic in 2 weeks for review of medication effectiveness.  Discussed DASH (Dietary Approaches to Stop Hypertension) DASH diet is lower in sodium than a typical American diet. Cut back on foods that are high in saturated fat, cholesterol, and trans fats. Eat more whole-grain foods, fish, poultry, and nuts Remain active and exercise as tolerated daily.   Morbid obesity with BMI of 40.0-44.9, adult [facility]) Start Saxenda Discussed appropriate BMI Diet modification. Physical activity. Encouraged/praised to build confidence.   Anxiety state/Depressive disorder Continue Cymbalta Reviewed relaxation techniques.  Sleep hygiene. Recommended Cognitive Behavioral Therapy (CBT). Recommended mindfulness meditation and exercise.   Insight-oriented psychotherapy given for 16 minutes exclusively. Psychoeducation:  encouraged personality growth wand development through coping techniques and problem-solving skills. Limit/Decrease/Monitor drug/alcohol intake.     Chronic anemia Monitor CBC, anemia panel  Psoriatic arthritis (HCC) Continue medication and follow with rheumatology  Fibromyalgia Continue to follow with pain management Increase exercise as tolerated  Stage 3a chronic kidney disease ([place]) Discussed how what you eat and drink can aide in kidney protection. Stay well hydrated. Avoid high salt foods. Avoid NSAIDS. Keep BP and BG well controlled.   Take medications as prescribed. Remain active and exercise as tolerated daily. Maintain weight.  Continue to monitor.  Abnormal glucose Education: Reviewed 'ABCs' of diabetes management  Discussed goals to be met and/or maintained include A1C (<7) Blood pressure (<130/80) Cholesterol (LDL  <70) Continue Eye Exam yearly  Continue Dental Exam Q6 mo Discussed dietary recommendations Discussed Physical Activity recommendations  Alcohol abuse Currently drinking 2-3 beers a day, this is decreased from previous use Alcohol cessation instruction/counseling given:    Other insomnia Likely alcohol related Discussed good sleep hygiene. Establish bed and wake times. Sleep restriction-only sleep estimated hrs sleep. Bed only for sex and sleep, only sleep when sleepy, out of bed if anxious (stimulus control). Reviewed relaxation techniques, mindful meditations. Expected sleep duration. Addressed worries about not sleeping.   Chronic venous insufficiency/venous stasis  Sugar slurry applied Discussed UNA Boot wrap if s/s fail to improve Possible referral to Vascular  Other Chest Pain EKG - NSR  Orders Placed This Encounter  Procedures   Iron, TIBC and Ferritin Panel   EKG 12-Lead   Meds ordered this encounter  Medications   lisinopril (ZESTRIL) 10 MG tablet    Sig: Take 1 tablet daily for BP    Dispense:  90 tablet    Refill:  3    Order Specific Question:   Supervising Provider    Answer:   Unk Pinto [[id number]]   hydrochlorothiazide (HYDRODIURIL) 12.5 MG tablet    Sig: Take 1 tablet (12.5 mg total) by mouth daily.    Dispense:  30 tablet    Refill:  0    Order Specific Question:   Supervising Provider    Answer:   Unk Pinto [phone]   DISCONTD: Liraglutide -Weight Management (SAXENDA) 18 MG/3ML SOPN    Sig: Inject 0.6 mg into the skin daily. Inject one pen SQ daily for weight loss    Dispense:  3 mL    Refill:  3    Order Specific Question:   Supervising Provider    Answer:   Unk Pinto [phone]   Notify office for further evaluation  and treatment, questions or concerns if any reported s/s fail to improve.   The patient was advised to call back or seek an in-person evaluation if any symptoms worsen or if the condition fails to improve as  anticipated.   Further disposition pending results of labs. Discussed med's effects and SE's.    I discussed the assessment and treatment plan with the patient. The patient was provided an opportunity to ask questions and all were answered. The patient agreed with the plan and demonstrated an understanding of the instructions.  Discussed med's effects and SE's. Screening labs and tests as requested with regular follow-up as recommended.  I provided 25 minutes of face-to-face time during this encounter including counseling, chart review, and critical decision making was preformed.  ---------------------------------------------------------------------------------------------------------------------  HPI 67 y.o. female  presents alongside husband for general follow up.  She follows with Rheumatology, Dr. Isaias Sakai for psoriatic arthritis- she is on Cimza injections. Recently had lab work completed that revealed low hemoglobin.    She continues to be followed by Dr. Hardin Negus who is pain medicine.   She is on Cymbalta for depression and pain.  Reports effectiveness.  She has a hx of vascular disease with a venous stasis ulcer that has appeared on her LLE.  She has not been applying any treatment.  She continues to be morbidly obese.  Weight gain is increasing.  Continues to drink alcohol daily with a hx of alcohol use disorder.  BMI is Body mass index is 45.46 kg/m., she has not been working on diet and exercise.  Wt Readings from Last 3 Encounters:  01/04/23 277 lb 6.4 oz (125.8 kg)  11/22/22 272 lb 9.6 oz (123.7 kg)  07/13/22 249 lb 6.4 oz (113.1 kg)   She does not check her BP at home, currently on Amlodipine 5 mg QD, today their BP is BP: (!) 158/90  BP Readings from Last 3 Encounters:  01/04/23 (!) 158/90  11/22/22 132/84  07/13/22 (!) 138/90  She does not workout. She denies chest pain, shortness of breath, dizziness.  She is not on cholesterol medication.  Her cholesterol is at  goal. The cholesterol last visit was:   Lab Results  Component Value Date   CHOL 152 02/14/2019   HDL 72 02/14/2019   LDLCALC 65 02/14/2019   TRIG 76 02/14/2019   CHOLHDL 2.1 02/14/2019    She has been working on diet and exercise for abnormal glucose . Last A1C in the office was:  Lab Results  Component Value Date   HGBA1C 5.4 03/08/2022   Patient is on Vitamin D supplement.   Lab Results  Component Value Date   VD25OH 36.85 03/08/2022     She does have a history of anemia. She is no longer on an iron supplement.  Last review of hemoglobin was low.  She denies fatigue, does have SOB, feels its contributed to weight.  Lab Results  Component Value Date   WBC 5.5 07/13/2022   HGB 12.3 07/13/2022   HCT 37.5 07/13/2022   MCV 93.8 07/13/2022   PLT 264 07/13/2022   Last GFR was 58.07 on 03/08/22- she has not been drinking much water.  Drinks 2-3 beers a day. She also drinks diet mountain dew.  She is trying to drink more water and limit soda intake.  Current Medications:  Current Outpatient Medications on File Prior to Visit  Medication Sig   amLODipine (NORVASC) 5 MG tablet Take 1 tablet (5 mg total) by mouth daily.  DULoxetine (CYMBALTA) 60 MG capsule Take 1 capsule (60 mg total) by mouth daily.   metaxalone (SKELAXIN) 800 MG tablet Take 800 mg by mouth 3 (three) times daily.   metaxalone (SKELAXIN) 800 MG tablet Take 1 tablet (800 mg total) by mouth 3 (three) times daily as needed.   morphine (MS CONTIN) 30 MG 12 hr tablet Take 1 tablet (30 mg total) by mouth every 8 (eight) hours.   [START ON 01/12/2023] morphine (MS CONTIN) 30 MG 12 hr tablet Take 1 tablet (30 mg total) by mouth every 8 (eight) hours (fill 01/12/23)   SYMPROIC 0.2 MG TABS Take 1 tablet by mouth daily.   traZODone (DESYREL) 100 MG tablet TAKE 1 TABLET BY MOUTH EVERYDAY AT BEDTIME   BIOTIN PO Take by mouth. (Patient not taking: Reported on 11/22/2022)   Cyanocobalamin (B-12 PO) Take by mouth. (Patient not taking:  Reported on 11/22/2022)   EPINEPHrine 0.3 mg/0.3 mL IJ SOAJ injection Inject 0.3 mLs (0.3 mg total) into the muscle once. (Patient not taking: Reported on 01/04/2023)   HYDROcodone-acetaminophen (NORCO) 10-325 MG tablet Take 1 tablet by mouth 2 (two) times daily as needed.   metaxalone (SKELAXIN) 800 MG tablet Take 1 tablet (800 mg total) by mouth 3 (three) times daily as needed. (Patient not taking: Reported on 01/04/2023)   morphine (MS CONTIN) 30 MG 12 hr tablet SMARTSIG:1 Tablet(s) By Mouth Every 12 Hours (Patient not taking: Reported on 01/04/2023)   morphine (MS CONTIN) 30 MG 12 hr tablet Take 1 tablet (30 mg total) by mouth every 8 (eight) hours   morphine (MS CONTIN) 30 MG 12 hr tablet Take 1 tablet (30 mg total) by mouth every 8 (eight) hours.   phentermine 37.5 MG capsule TAKE 1 CAPSULE BY MOUTH EVERY MORNING (NOT COVERED) (Patient not taking: Reported on 01/04/2023)   POTASSIUM PO Take by mouth. (Patient not taking: Reported on 11/22/2022)   tiZANidine (ZANAFLEX) 2 MG tablet Take 1 tablet (2 mg total) by mouth 3 (three) times daily as needed (stop metaxolone) (Patient not taking: Reported on 01/04/2023)   No current facility-administered medications on file prior to visit.     Allergies: No Known Allergies   Medical History:  Past Medical History:  Diagnosis Date   Allergy    Anemia    Anxiety    Arthritis    Depression    Fibromyalgia    HTN (hypertension)    MRSA (methicillin resistant Staphylococcus aureus)    Obese    Plantar fasciitis    Pneumonia    Seasonal allergies    Sleep apnea 2012   took test in [place]-told to use cpap-could not ude   Family history- Reviewed and unchanged Social history- Reviewed and unchanged   Review of Systems:  Review of Systems  Constitutional:  Positive for malaise/fatigue.  HENT:  Negative for congestion and hearing loss.   Eyes:  Negative for blurred vision.  Respiratory:  Negative for cough and wheezing.   Cardiovascular:   Negative for chest pain, palpitations and orthopnea.  Gastrointestinal:  Positive for constipation (with pain meds). Negative for heartburn, nausea and vomiting.  Genitourinary:  Negative for dysuria.  Musculoskeletal:  Positive for back pain, joint pain and myalgias.  Neurological:  Negative for dizziness and headaches.  Endo/Heme/Allergies:  Does not bruise/bleed easily.  Psychiatric/Behavioral:  Positive for depression. The patient is nervous/anxious.    Physical Exam: BP (!) 158/90   Pulse 91   Temp 97.7 F (36.5 C)   Wt 277  lb 6.4 oz (125.8 kg)   SpO2 99%   BMI 45.46 kg/m  Wt Readings from Last 3 Encounters:  01/04/23 277 lb 6.4 oz (125.8 kg)  11/22/22 272 lb 9.6 oz (123.7 kg)  07/13/22 249 lb 6.4 oz (113.1 kg)   General Appearance: Very pleasant obese female, in no apparent distress. Eyes: PERRLA, EOMs, conjunctiva no swelling or erythema Sinuses: No Frontal/maxillary tenderness ENT/Mouth: Ext aud canals clear, TMs without erythema, bulging. No erythema, swelling, or exudate on post pharynx.  Tonsils not swollen or erythematous. Hearing normal.  Neck: Supple, thyroid normal.  Respiratory: Respiratory effort normal, BS equal bilaterally without rales, rhonchi, wheezing or stridor.  Cardio: RRR with no MRGs. Brisk peripheral pulses with +3 edema.  Abdomen: Soft, + BS.  Non tender, no guarding, rebound, hernias, masses. Lymphatics: Non tender without lymphadenopathy.  Musculoskeletal: Decrease ROM of knees, 4/5 strength, Antalgic gait Skin: LLE wit approximately 2 cm round erythematous abrasion located on shin.  Surrounding skin mottled and intact.  No weeping.  Warm, dry without rashes, lesions, ecchymosis.  Neuro: Cranial nerves intact. No cerebellar symptoms.  Psych: Awake and oriented X 3, normal affect, Insight and Judgment appropriate.   EKG:  Completed - NSR  Darrol Jump, NP 9:43 AM [facility]

## 2023-01-05 ENCOUNTER — Other Ambulatory Visit: Payer: Self-pay

## 2023-01-05 ENCOUNTER — Other Ambulatory Visit: Payer: Self-pay | Admitting: Nurse Practitioner

## 2023-01-05 DIAGNOSIS — D509 Iron deficiency anemia, unspecified: Secondary | ICD-10-CM

## 2023-01-05 LAB — IRON,TIBC AND FERRITIN PANEL
%SAT: 10 % (calc) — ABNORMAL LOW (ref 16–45)
Ferritin: 20 ng/mL (ref 16–288)
Iron: 35 ug/dL — ABNORMAL LOW (ref 45–160)
TIBC: 353 mcg/dL (calc) (ref 250–450)

## 2023-01-05 MED ORDER — SAXENDA 18 MG/3ML ~~LOC~~ SOPN: 0.6000 mg | PEN_INJECTOR | Freq: Every day | SUBCUTANEOUS | 3 refills | Status: DC

## 2023-01-05 MED ORDER — FERROUS SULFATE 325 (65 FE) MG PO TABS: 325.0000 mg | ORAL_TABLET | Freq: Every day | ORAL | 0 refills | Status: DC

## 2023-01-12 ENCOUNTER — Encounter: Payer: Self-pay | Admitting: Nurse Practitioner

## 2023-01-23 ENCOUNTER — Other Ambulatory Visit (HOSPITAL_COMMUNITY): Payer: Self-pay

## 2023-01-23 MED ORDER — MORPHINE SULFATE ER 30 MG PO TBCR
30.0000 mg | EXTENDED_RELEASE_TABLET | Freq: Three times a day (TID) | ORAL | 0 refills | Status: DC
  Filled 2023-01-23: qty 90, 30d supply, fill #0

## 2023-01-24 ENCOUNTER — Ambulatory Visit (INDEPENDENT_AMBULATORY_CARE_PROVIDER_SITE_OTHER): Payer: HMO | Admitting: Nurse Practitioner

## 2023-01-24 ENCOUNTER — Other Ambulatory Visit (HOSPITAL_COMMUNITY): Payer: Self-pay

## 2023-01-24 ENCOUNTER — Encounter: Payer: Self-pay | Admitting: Nurse Practitioner

## 2023-01-24 VITALS — BP 120/84 | HR 77 | Temp 98.1°F | Ht 65.5 in | Wt 274.2 lb

## 2023-01-24 DIAGNOSIS — D509 Iron deficiency anemia, unspecified: Secondary | ICD-10-CM

## 2023-01-24 DIAGNOSIS — L97821 Non-pressure chronic ulcer of other part of left lower leg limited to breakdown of skin: Secondary | ICD-10-CM

## 2023-01-24 DIAGNOSIS — D649 Anemia, unspecified: Secondary | ICD-10-CM | POA: Diagnosis not present

## 2023-01-24 DIAGNOSIS — F411 Generalized anxiety disorder: Secondary | ICD-10-CM | POA: Diagnosis not present

## 2023-01-24 DIAGNOSIS — M79641 Pain in right hand: Secondary | ICD-10-CM | POA: Diagnosis not present

## 2023-01-24 DIAGNOSIS — M79672 Pain in left foot: Secondary | ICD-10-CM | POA: Diagnosis not present

## 2023-01-24 DIAGNOSIS — Z6841 Body Mass Index (BMI) 40.0 and over, adult: Secondary | ICD-10-CM

## 2023-01-24 DIAGNOSIS — M79642 Pain in left hand: Secondary | ICD-10-CM | POA: Diagnosis not present

## 2023-01-24 DIAGNOSIS — I10 Essential (primary) hypertension: Secondary | ICD-10-CM | POA: Diagnosis not present

## 2023-01-24 DIAGNOSIS — I872 Venous insufficiency (chronic) (peripheral): Secondary | ICD-10-CM

## 2023-01-24 DIAGNOSIS — F1010 Alcohol abuse, uncomplicated: Secondary | ICD-10-CM | POA: Diagnosis not present

## 2023-01-24 DIAGNOSIS — I83028 Varicose veins of left lower extremity with ulcer other part of lower leg: Secondary | ICD-10-CM | POA: Diagnosis not present

## 2023-01-24 DIAGNOSIS — F32A Depression, unspecified: Secondary | ICD-10-CM

## 2023-01-24 DIAGNOSIS — M79671 Pain in right foot: Secondary | ICD-10-CM | POA: Diagnosis not present

## 2023-01-24 DIAGNOSIS — M25551 Pain in right hip: Secondary | ICD-10-CM | POA: Diagnosis not present

## 2023-01-24 MED ORDER — WEGOVY 0.25 MG/0.5ML ~~LOC~~ SOAJ: 0.2500 mg | SUBCUTANEOUS | 2 refills | Status: DC

## 2023-01-24 NOTE — Progress Notes (Signed)
Follow Up  Assessment and Plan:   [name] was seen today for a general follow up  Diagnoses and all orders for this visit:  Essential hypertension Better controlled on Lisinopril and HCTZ May take Lisinopril 5 mg at PM if BP >160/90 Record readings on BP log and bring back to clinic in 2 weeks for review of medication effectiveness.  Discussed DASH (Dietary Approaches to Stop Hypertension) DASH diet is lower in sodium than a typical American diet. Cut back on foods that are high in saturated fat, cholesterol, and trans fats. Eat more whole-grain foods, fish, poultry, and nuts Remain active and exercise as tolerated daily.   Morbid obesity with BMI of 40.0-44.9, adult ([place]) Saxenda too expensive - not covered Start [facility] for weight loss and tmt of venous insufficiency Discussed appropriate BMI Diet modification. Physical activity. Encouraged/praised to build confidence.  Anxiety state/Depressive disorder Continue Cymbalta Reviewed relaxation techniques.  Sleep hygiene. Recommended Cognitive Behavioral Therapy (CBT). Recommended mindfulness meditation and exercise.   Insight-oriented psychotherapy given for 16 minutes exclusively. Psychoeducation:  encouraged personality growth wand development through coping techniques and problem-solving skills. Limit/Decrease/Monitor drug/alcohol intake.     Chronic anemia/low Hgb Monitor CBC, anemia panel Continue Ferrous sulfate  Alcohol abuse Currently drinking 2-3 beers a day, this is decreased from previous use Alcohol cessation instruction/counseling given:    Chronic venous insufficiency/venous stasis  Sugar slurry applied Discussed UNA Boot wrap if s/s fail to improve Possible referral to Vascular  No orders of the defined types were placed in this encounter.  Notify office for further evaluation and treatment, questions or concerns if any reported s/s fail to improve.   The patient was advised to call back or seek an  in-person evaluation if any symptoms worsen or if the condition fails to improve as anticipated.   Further disposition pending results of labs. Discussed med's effects and SE's.    I discussed the assessment and treatment plan with the patient. The patient was provided an opportunity to ask questions and all were answered. The patient agreed with the plan and demonstrated an understanding of the instructions.  Discussed med's effects and SE's. Screening labs and tests as requested with regular follow-up as recommended.  I provided 25 minutes of face-to-face time during this encounter including counseling, chart review, and critical decision making was preformed.  ---------------------------------------------------------------------------------------------------------------------  HPI 67 y.o. female  presents alongside husband for general follow up.  She follows with Rheumatology, Dr. Isaias Sakai for psoriatic arthritis- she is on Cimza injections. Recently had lab work completed that revealed low hemoglobin.  Anemia panel was obtained and patient was asked to start ferrous sulfate.    She continues to be followed by Dr. Hardin Negus who is pain medicine.   She is on Cymbalta for depression and pain.  Reports effectiveness.  She has a hx of vascular disease with a venous stasis ulcer that has appeared on her LLE.  She has been applying sugar/betadine slurry with significant healing. She continues to be morbidly obese.  Weight gain is increasing.  Continues to drink alcohol daily with a hx of alcohol use disorder.  BMI is Body mass index is 44.94 kg/m., she has not been working on diet and exercise. [name] tried getting Kirke Shaggy but too expensive.   Wt Readings from Last 3 Encounters:  01/24/23 274 lb 3.2 oz (124.4 kg)  01/04/23 277 lb 6.4 oz (125.8 kg)  11/22/22 272 lb 9.6 oz (123.7 kg)   She does not check her BP at home, No  longer taking Amlodipine.  Continues HCTZ and Lisinopril, today their BP is  BP: 120/84  BP Readings from Last 3 Encounters:  01/24/23 120/84  01/04/23 (!) 158/90  11/22/22 132/84  She does not workout. She denies chest pain, shortness of breath, dizziness.  She is not on cholesterol medication.  Her cholesterol is at goal. The cholesterol last visit was:   Lab Results  Component Value Date   CHOL 152 02/14/2019   HDL 72 02/14/2019   LDLCALC 65 02/14/2019   TRIG 76 02/14/2019   CHOLHDL 2.1 02/14/2019    She has been working on diet and exercise for abnormal glucose . Last A1C in the office was:  Lab Results  Component Value Date   HGBA1C 5.4 03/08/2022   She does have a history of anemia. She is no longer on an iron supplement.  Last review of hemoglobin was low.  She denies fatigue, does have SOB, feels its contributed to weight.  Lab Results  Component Value Date   WBC 5.5 07/13/2022   HGB 12.3 07/13/2022   HCT 37.5 07/13/2022   MCV 93.8 07/13/2022   PLT 264 07/13/2022   Last GFR was 58.07 on 03/08/22- she has not been drinking much water.  Drinks 2-3 beers a day. She also drinks diet mountain dew.  She is trying to drink more water and limit soda intake.  Current Medications:  Current Outpatient Medications on File Prior to Visit  Medication Sig   amLODipine (NORVASC) 5 MG tablet Take 1 tablet (5 mg total) by mouth daily.   DULoxetine (CYMBALTA) 60 MG capsule Take 1 capsule (60 mg total) by mouth daily.   ferrous sulfate 325 (65 FE) MG tablet Take 1 tablet (325 mg total) by mouth daily.   hydrochlorothiazide (HYDRODIURIL) 12.5 MG tablet Take 1 tablet (12.5 mg total) by mouth daily.   lisinopril (ZESTRIL) 10 MG tablet Take 1 tablet daily for BP   Magnesium 400 MG TABS Take by mouth daily.   morphine (MS CONTIN) 30 MG 12 hr tablet Take 1 tablet (30 mg total) by mouth every 8 (eight) hours.   SYMPROIC 0.2 MG TABS Take 1 tablet by mouth daily.   tiZANidine (ZANAFLEX) 2 MG tablet Take 1 tablet (2 mg total) by mouth 3 (three) times daily as needed (stop  metaxolone)   traZODone (DESYREL) 100 MG tablet TAKE 1 TABLET BY MOUTH EVERYDAY AT BEDTIME   BIOTIN PO Take by mouth. (Patient not taking: Reported on 11/22/2022)   Cyanocobalamin (B-12 PO) Take by mouth. (Patient not taking: Reported on 11/22/2022)   EPINEPHrine 0.3 mg/0.3 mL IJ SOAJ injection Inject 0.3 mLs (0.3 mg total) into the muscle once. (Patient not taking: Reported on 01/04/2023)   Liraglutide -Weight Management (SAXENDA) 18 MG/3ML SOPN Inject 0.6 mg into the skin daily. Inject one pen SQ daily for weight loss (Patient not taking: Reported on 01/24/2023)   metaxalone (SKELAXIN) 800 MG tablet Take 800 mg by mouth 3 (three) times daily. (Patient not taking: Reported on 01/24/2023)   metaxalone (SKELAXIN) 800 MG tablet Take 1 tablet (800 mg total) by mouth 3 (three) times daily as needed. (Patient not taking: Reported on 01/24/2023)   morphine (MS CONTIN) 30 MG 12 hr tablet Take 1 tablet (30 mg total) by mouth every 8 (eight) hours (fill 01/12/23)   phentermine 37.5 MG capsule TAKE 1 CAPSULE BY MOUTH EVERY MORNING (NOT COVERED) (Patient not taking: Reported on 01/04/2023)   POTASSIUM PO Take by mouth. (Patient not taking: Reported  on 11/22/2022)   No current facility-administered medications on file prior to visit.     Allergies: No Known Allergies   Medical History:  Past Medical History:  Diagnosis Date   Allergy    Anemia    Anxiety    Arthritis    Depression    Fibromyalgia    HTN (hypertension)    MRSA (methicillin resistant Staphylococcus aureus)    Obese    Plantar fasciitis    Pneumonia    Seasonal allergies    Sleep apnea 2012   took test in [place]-told to use cpap-could not ude   Family history- Reviewed and unchanged Social history- Reviewed and unchanged   Review of Systems:  Review of Systems  Constitutional:  Negative for malaise/fatigue.  HENT:  Negative for congestion and hearing loss.   Eyes:  Negative for blurred vision.  Respiratory:  Negative for cough  and wheezing.   Cardiovascular:  Negative for chest pain, palpitations and orthopnea.  Gastrointestinal:  Negative for constipation, heartburn, nausea and vomiting.  Genitourinary:  Negative for dysuria.  Musculoskeletal:  Positive for myalgias. Negative for back pain and joint pain.  Neurological:  Negative for dizziness and headaches.  Endo/Heme/Allergies:  Does not bruise/bleed easily.  Psychiatric/Behavioral:  Positive for depression. The patient is nervous/anxious.    Physical Exam: BP 120/84   Pulse 77   Temp 98.1 F (36.7 C)   Ht 5' 5.5" (1.664 m)   Wt 274 lb 3.2 oz (124.4 kg)   SpO2 99%   BMI 44.94 kg/m  Wt Readings from Last 3 Encounters:  01/24/23 274 lb 3.2 oz (124.4 kg)  01/04/23 277 lb 6.4 oz (125.8 kg)  11/22/22 272 lb 9.6 oz (123.7 kg)   General Appearance: Very pleasant obese female, in no apparent distress. Eyes: PERRLA, EOMs, conjunctiva no swelling or erythema Sinuses: No Frontal/maxillary tenderness ENT/Mouth: Ext aud canals clear, TMs without erythema, bulging. No erythema, swelling, or exudate on post pharynx.  Tonsils not swollen or erythematous. Hearing normal.  Neck: Supple, thyroid normal.  Respiratory: Respiratory effort normal, BS equal bilaterally without rales, rhonchi, wheezing or stridor.  Cardio: RRR with no MRGs. Brisk peripheral pulses with +3 edema.  Abdomen: Soft, + BS.  Non tender, no guarding, rebound, hernias, masses. Lymphatics: Non tender without lymphadenopathy.  Musculoskeletal: Decrease ROM of knees, 4/5 strength, Antalgic gait Skin: LLE with well healing venous stasis ulcer.  LLE with mild erythema.  Mild edema  Neuro: Cranial nerves intact. No cerebellar symptoms.  Psych: Awake and oriented X 3, normal affect, Insight and Judgment appropriate.   EKG:  Completed - NSR  Darrol Jump, NP 3:09 PM [facility]

## 2023-01-24 NOTE — Patient Instructions (Signed)
If BP is >160/90 take Lisinopril 5 mg (1/2 tablet).  Venous Ulcer A venous ulcer is a shallow sore on your lower leg. Venous ulcer is the most common type of lower leg ulcer. You may have venous ulcers on one leg or on both legs. This condition most often develops around your ankles. This type of ulcer may last for a long time (chronic ulcer) or it may return often (recurrent ulcer). What are the causes? This condition is caused by poor blood flow in your legs. The poor flow causes blood to pool in your legs. This can break the skin, causing an ulcer. What increases the risk? You are more likely to develop this condition if: You are female. You are 66 years of age or older. You have had a leg ulcer in the past. You have varicose veins. You have clots in your lower leg veins (deep vein thrombosis). You have irritation and swelling (inflammation) of your leg veins (phlebitis). You have recently been pregnant. You have a family history of chronic venous insufficiency. This is a condition where the leg veins do not pump enough blood from the legs to the heart. Your risk may be higher if: You are not active. You are overweight. You smoke. What are the signs or symptoms? The main symptom of this condition is an open sore near your ankle. Other symptoms may include: Swelling. Fluid coming from the ulcer. Bleeding. Itching. Pain and swelling. This gets worse when you stand up and feels better when you raise your leg. Changes in the skin, such as: Thick skin. Blotchy skin. Dark skin. How is this treated? Treatments include: Keeping your leg raised (elevated). Wearing a type of bandage or stocking to keep pressure (compression) on the veins of your leg. Taking medicines, including antibiotic medicines. Cleaning your ulcer and removing any dead tissue from the wound. Using bandages and wraps that have medicines in them to cover your ulcer. Closing the wound using a piece of skin taken from  another area of your body (graft). Healing may take a long time. You may need to see a foot doctor or a vein specialist. Follow these instructions at home: Medicines Take or apply over-the-counter and prescription medicines only as told by your doctor. If you were prescribed antibiotics, take them as told by your doctor. Do not stop taking them even if you start to feel better. Ask your doctor if you should take aspirin before long trips. Wound care Follow instructions from your doctor about how to take care of your wound. Make sure you: Wash your hands with soap and water for at least 20 seconds before and after you change your bandage. If you cannot use soap and water, use hand sanitizer. Change your bandage. Leave stitches or skin glue in place for at least 2 weeks. Leave tape strips alone unless you are told to take them off. You may trim the edges of the tape strips if they curl up. Ask when you should remove your bandage. If your bandage is dry and sticks to your leg when you try to remove it, moisten or wet the bandage with saline solution or water alone to make it easier to remove. Check your wound every day for signs of infection. Have a caregiver do this for you if you are not able to do it yourself. Check for: More redness, swelling, or pain. More fluid or blood. Warmth. Pus or a bad smell. Activity Get up to take short walks every 1 to  2 hours. Ask for help if you feel weak or unsteady. Rest with your legs raised during the day. If you can, keep your legs above the level of your heart for 30 minutes, 3-4 times a day, or as told by your doctor. Do not sit with your legs crossed. Return to your normal activities when your doctor says that it is safe. General instructions  Wear elastic stockings, compression stockings, or support hose as told by your doctor. Raise the foot of your bed as told by your doctor. Do not smoke or use any products that contain nicotine or tobacco. If  you need help quitting, ask your doctor. Try to eat a heart healthy and low salt diet. Keep a healthy body weight. Keep all follow-up visits. Your doctor will check if your ulcer is healing and change treatments if needed. Contact a doctor if: Your ulcer is getting larger or is not healing. Your pain gets worse. You have any signs of infection. You have a fever. This information is not intended to replace advice given to you by your health care provider. Make sure you discuss any questions you have with your health care provider. Document Revised: 05/30/2022 Document Reviewed: 05/30/2022 Elsevier Patient Education  [place].

## 2023-01-27 ENCOUNTER — Other Ambulatory Visit: Payer: Self-pay | Admitting: Nurse Practitioner

## 2023-01-27 DIAGNOSIS — I83028 Varicose veins of left lower extremity with ulcer other part of lower leg: Secondary | ICD-10-CM

## 2023-01-27 DIAGNOSIS — I10 Essential (primary) hypertension: Secondary | ICD-10-CM

## 2023-01-27 DIAGNOSIS — I872 Venous insufficiency (chronic) (peripheral): Secondary | ICD-10-CM

## 2023-01-31 DIAGNOSIS — L4050 Arthropathic psoriasis, unspecified: Secondary | ICD-10-CM | POA: Diagnosis not present

## 2023-02-14 ENCOUNTER — Encounter: Payer: Self-pay | Admitting: Nurse Practitioner

## 2023-02-14 ENCOUNTER — Ambulatory Visit (INDEPENDENT_AMBULATORY_CARE_PROVIDER_SITE_OTHER): Payer: HMO | Admitting: Nurse Practitioner

## 2023-02-14 VITALS — BP 108/64 | HR 93 | Temp 97.3°F | Ht 65.5 in | Wt 254.0 lb

## 2023-02-14 DIAGNOSIS — R062 Wheezing: Secondary | ICD-10-CM | POA: Diagnosis not present

## 2023-02-14 DIAGNOSIS — R051 Acute cough: Secondary | ICD-10-CM | POA: Diagnosis not present

## 2023-02-14 DIAGNOSIS — R0609 Other forms of dyspnea: Secondary | ICD-10-CM | POA: Diagnosis not present

## 2023-02-14 DIAGNOSIS — J069 Acute upper respiratory infection, unspecified: Secondary | ICD-10-CM | POA: Diagnosis not present

## 2023-02-14 DIAGNOSIS — I10 Essential (primary) hypertension: Secondary | ICD-10-CM

## 2023-02-14 MED ORDER — BENZONATATE 100 MG PO CAPS: 100.0000 mg | ORAL_CAPSULE | Freq: Three times a day (TID) | ORAL | 0 refills | Status: DC | PRN

## 2023-02-14 MED ORDER — AZITHROMYCIN 250 MG PO TABS: ORAL_TABLET | ORAL | 1 refills | Status: DC

## 2023-02-14 MED ORDER — PROMETHAZINE-DM 6.25-15 MG/5ML PO SYRP: 5.0000 mL | ORAL_SOLUTION | Freq: Four times a day (QID) | ORAL | 0 refills | Status: DC | PRN

## 2023-02-14 MED ORDER — IPRATROPIUM-ALBUTEROL 0.5-2.5 (3) MG/3ML IN SOLN
3.0000 mL | Freq: Once | RESPIRATORY_TRACT | Status: DC
Start: 2023-02-14 — End: 2023-02-22

## 2023-02-14 NOTE — Patient Instructions (Signed)

## 2023-02-14 NOTE — Progress Notes (Signed)
Assessment and Plan:  [name] was seen today for an episodic visit.  Diagnoses and all order for this visit:  Upper respiratory tract infection, unspecified type Start tmt with abx given length of symptoms.  - azithromycin (ZITHROMAX) 250 MG tablet; Take 2 tablets on  Day 1,  followed by 1 tablet  daily for 4 more days    for Sinusitis  /Bronchitis  Dispense: 6 each; Refill: 1  Acute cough Stay well hydrated to keep mucus thin and productive.  - benzonatate (TESSALON PERLES) 100 MG capsule; Take 1 capsule (100 mg total) by mouth 3 (three) times daily as needed for cough.  Dispense: 90 capsule; Refill: 0  DOE (dyspnea on exertion)/Wheezing Nebulizer tmt administered - tolerated well Report to ER or call 911 for any difficulty breathing.  - ipratropium-albuterol (DUONEB) 0.5-2.5 (3) MG/3ML nebulizer solution 3 mL  Morbid obesity Trending down Discussed appropriate BMI Diet modification. Physical activity. Encouraged/praised to build confidence.  Hypertension Discussed how decreasing alcohol can help to improve BP Discussed DASH (Dietary Approaches to Stop Hypertension) DASH diet is lower in sodium than a typical American diet. Cut back on foods that are high in saturated fat, cholesterol, and trans fats. Eat more whole-grain foods, fish, poultry, and nuts Remain active and exercise as tolerated daily.  Monitor BP at home-Call if greater than 130/80.  Check CMP/CBC   Notify office for further evaluation and treatment, questions or concerns if s/s fail to improve. The risks and benefits of my recommendations, as well as other treatment options were discussed with the patient today. Questions were answered.  Further disposition pending results of labs. Discussed med's effects and SE's.    Over 25 minutes of exam, counseling, chart review, and critical decision making was performed.   Future Appointments  Date Time Provider Department Center  02/23/2023  2:30 PM  Adela Glimpse, NP [facility] None  11/23/2023  3:00 PM Keeton Kassebaum, Archie Patten, NP [facility] None    ------------------------------------------------------------------------------------------------------------------   HPI BP 108/64   Pulse 93   Temp (!) 97.3 F (36.3 C)   Ht 5' 5.5" (1.664 m)   Wt 254 lb (115.2 kg)   SpO2 99%   BMI 41.62 kg/m    Patient complains of symptoms of a URI, possible sinusitis. Symptoms include achiness, congestion, cough described as productive, fever 99.9 F, headache described as throbbing, nasal congestion, shortness of breath, and wheezing after returning from [place] on vacation.  Onset of symptoms was 10 days ago, and has been unchanged since that time. Treatment to date: cough suppressants.    States that she has not been drinking alcohol or taking in much food during this time. She denies any withdraw symptoms from not drinking alcohol.  BMI is Body mass index is 41.62 kg/m., she has been working on diet and exercise. Wt Readings from Last 3 Encounters:  02/14/23 254 lb (115.2 kg)  01/24/23 274 lb 3.2 oz (124.4 kg)  01/04/23 277 lb 6.4 oz (125.8 kg)   BP is stable today.  She has been continuing BP log in home and reports BP has been well controlled and averating <130/90  BP Readings from Last 3 Encounters:  02/14/23 108/64  01/24/23 120/84  01/04/23 (!) 158/90    Past Medical History:  Diagnosis Date   Allergy    Anemia    Anxiety    Arthritis    Depression    Fibromyalgia    HTN (hypertension)    MRSA (methicillin resistant Staphylococcus aureus)  Obese    Plantar fasciitis    Pneumonia    Seasonal allergies    Sleep apnea 2012   took test in [place]-told to use cpap-could not ude     No Known Allergies  Current Outpatient Medications on File Prior to Visit  Medication Sig   amLODipine (NORVASC) 5 MG tablet Take 1 tablet (5 mg total) by mouth daily.   BIOTIN PO Take by mouth.   Cyanocobalamin (B-12 PO) Take by mouth.    DULoxetine (CYMBALTA) 60 MG capsule Take 1 capsule (60 mg total) by mouth daily.   EPINEPHrine 0.3 mg/0.3 mL IJ SOAJ injection Inject 0.3 mLs (0.3 mg total) into the muscle once.   ferrous sulfate 325 (65 FE) MG tablet Take 1 tablet (325 mg total) by mouth daily.   hydrochlorothiazide (HYDRODIURIL) 12.5 MG tablet TAKE 1 TABLET BY MOUTH EVERY DAY   lisinopril (ZESTRIL) 10 MG tablet Take 1 tablet daily for BP   Magnesium 400 MG TABS Take by mouth daily.   metaxalone (SKELAXIN) 800 MG tablet Take 800 mg by mouth 3 (three) times daily.   metaxalone (SKELAXIN) 800 MG tablet Take 1 tablet (800 mg total) by mouth 3 (three) times daily as needed.   morphine (MS CONTIN) 30 MG 12 hr tablet Take 1 tablet (30 mg total) by mouth every 8 (eight) hours.   POTASSIUM PO Take by mouth.   SYMPROIC 0.2 MG TABS Take 1 tablet by mouth daily.   tiZANidine (ZANAFLEX) 2 MG tablet Take 1 tablet (2 mg total) by mouth 3 (three) times daily as needed (stop metaxolone)   traZODone (DESYREL) 100 MG tablet TAKE 1 TABLET BY MOUTH EVERYDAY AT BEDTIME   phentermine 37.5 MG capsule TAKE 1 CAPSULE BY MOUTH EVERY MORNING (NOT COVERED) (Patient not taking: Reported on 01/04/2023)   Semaglutide-Weight Management (WEGOVY) 0.25 MG/0.5ML SOAJ INJECT 0.25MG  INTO THE SKIN ONE TIME PER WEEK (Patient not taking: Reported on 02/14/2023)   No current facility-administered medications on file prior to visit.    ROS: all negative except what is noted in the HPI.   Physical Exam:  BP 108/64   Pulse 93   Temp (!) 97.3 F (36.3 C)   Ht 5' 5.5" (1.664 m)   Wt 254 lb (115.2 kg)   SpO2 99%   BMI 41.62 kg/m   General Appearance: NAD.  Awake, conversant and cooperative. Eyes: PERRLA, EOMs intact.  Sclera white.  Conjunctiva without erythema. Sinuses: No frontal/maxillary tenderness.  No nasal discharge. Nares patent.  ENT/Mouth: Ext aud canals clear.  Bilateral TMs w/DOL and without erythema or bulging. Hearing intact.  Posterior pharynx  without swelling or exudate.  Tonsils without swelling or erythema.  Neck: Supple.  No masses, nodules or thyromegaly. Respiratory: Effort is regular with non-labored breathing. Breath sounds are equal bilaterally with scattered wheezing throughout let and right anterior lung field upon expiraiton. Cardio: RRR with no MRGs. Brisk peripheral pulses without edema.  Abdomen: Active BS in all four quadrants.  Soft and non-tender without guarding, rebound tenderness, hernias or masses. Lymphatics: Non tender without lymphadenopathy.  Musculoskeletal: Full ROM, 5/5 strength, normal ambulation.  No clubbing or cyanosis. Skin: Appropriate color for ethnicity. Warm without rashes, lesions, ecchymosis, ulcers.  Neuro: CN II-XII grossly normal. Normal muscle tone without cerebellar symptoms and intact sensation.   Psych: AO X 3,  appropriate mood and affect, insight and judgment.     Adela Glimpse, NP 2:37 PM [facility]

## 2023-02-17 ENCOUNTER — Other Ambulatory Visit (HOSPITAL_COMMUNITY): Payer: Self-pay

## 2023-02-17 ENCOUNTER — Telehealth: Payer: Self-pay | Admitting: Nurse Practitioner

## 2023-02-17 DIAGNOSIS — Z79891 Long term (current) use of opiate analgesic: Secondary | ICD-10-CM | POA: Diagnosis not present

## 2023-02-17 DIAGNOSIS — M25551 Pain in right hip: Secondary | ICD-10-CM | POA: Diagnosis not present

## 2023-02-17 DIAGNOSIS — G894 Chronic pain syndrome: Secondary | ICD-10-CM | POA: Diagnosis not present

## 2023-02-17 DIAGNOSIS — M62831 Muscle spasm of calf: Secondary | ICD-10-CM | POA: Diagnosis not present

## 2023-02-17 DIAGNOSIS — M150 Primary generalized (osteo)arthritis: Secondary | ICD-10-CM | POA: Diagnosis not present

## 2023-02-17 MED ORDER — MORPHINE SULFATE ER 30 MG PO TBCR
30.0000 mg | EXTENDED_RELEASE_TABLET | Freq: Three times a day (TID) | ORAL | 0 refills | Status: DC
  Filled 2023-04-04: qty 90, 30d supply, fill #0

## 2023-02-17 MED ORDER — TIZANIDINE HCL 2 MG PO TABS
2.0000 mg | ORAL_TABLET | Freq: Three times a day (TID) | ORAL | 1 refills | Status: DC | PRN
  Filled 2023-02-17: qty 90, 30d supply, fill #0

## 2023-02-17 MED ORDER — MORPHINE SULFATE ER 30 MG PO TBCR
30.0000 mg | EXTENDED_RELEASE_TABLET | Freq: Three times a day (TID) | ORAL | 0 refills | Status: DC
  Filled 2023-02-22: qty 90, 30d supply, fill #0

## 2023-02-17 NOTE — Telephone Encounter (Signed)
LMOM to call back

## 2023-02-17 NOTE — Telephone Encounter (Signed)
Pt is not having much improvement, said her cough is about the same. Wanting to know what she should do before the weekend?

## 2023-02-22 ENCOUNTER — Other Ambulatory Visit (HOSPITAL_COMMUNITY): Payer: Self-pay

## 2023-02-22 ENCOUNTER — Ambulatory Visit (INDEPENDENT_AMBULATORY_CARE_PROVIDER_SITE_OTHER): Payer: HMO | Admitting: Nurse Practitioner

## 2023-02-22 ENCOUNTER — Encounter: Payer: Self-pay | Admitting: Nurse Practitioner

## 2023-02-22 VITALS — BP 110/74 | HR 91 | Temp 97.8°F | Ht 65.5 in | Wt 260.0 lb

## 2023-02-22 DIAGNOSIS — B3731 Acute candidiasis of vulva and vagina: Secondary | ICD-10-CM | POA: Diagnosis not present

## 2023-02-22 DIAGNOSIS — R051 Acute cough: Secondary | ICD-10-CM

## 2023-02-22 DIAGNOSIS — J069 Acute upper respiratory infection, unspecified: Secondary | ICD-10-CM | POA: Diagnosis not present

## 2023-02-22 DIAGNOSIS — Z6841 Body Mass Index (BMI) 40.0 and over, adult: Secondary | ICD-10-CM | POA: Diagnosis not present

## 2023-02-22 DIAGNOSIS — I10 Essential (primary) hypertension: Secondary | ICD-10-CM

## 2023-02-22 DIAGNOSIS — R0609 Other forms of dyspnea: Secondary | ICD-10-CM

## 2023-02-22 DIAGNOSIS — R062 Wheezing: Secondary | ICD-10-CM | POA: Diagnosis not present

## 2023-02-22 MED ORDER — DOXYCYCLINE HYCLATE 100 MG PO TABS
100.0000 mg | ORAL_TABLET | Freq: Two times a day (BID) | ORAL | 0 refills | Status: AC
Start: 2023-02-22 — End: 2023-03-01

## 2023-02-22 MED ORDER — IPRATROPIUM-ALBUTEROL 0.5-2.5 (3) MG/3ML IN SOLN
3.0000 mL | Freq: Once | RESPIRATORY_TRACT | Status: DC
Start: 2023-02-22 — End: 2024-03-11

## 2023-02-22 MED ORDER — FLUCONAZOLE 150 MG PO TABS
ORAL_TABLET | ORAL | 0 refills | Status: DC
Start: 2023-02-22 — End: 2023-08-14

## 2023-02-22 NOTE — Patient Instructions (Signed)

## 2023-02-22 NOTE — Progress Notes (Signed)
Assessment and Plan:  [name] was seen today for an episodic visit.  Diagnoses and all order for this visit:  Upper respiratory tract infection, unspecified type Non-responsive to Z-Pak Start Doxycycline Chest X-ray to assess for any underlying etiology.  Vaginal Candida secondary to abx tmt Suggest probiotic such as yogurt Will provide Diflucan for tmt given second round of abx tmt  Acute cough Continue Benzonatate and Promethazine Stay well hydrated to keep mucus thin and productive.  DOE (dyspnea on exertion)/Wheezing Nebulizer tmt administered - tolerated well Report to ER or call 911 for any difficulty breathing.  Morbid obesity Trending down Discussed appropriate BMI Diet modification. Physical activity. Encouraged/praised to build confidence.  Hypertension Controlled  Discussed how decreasing alcohol can help to improve BP Discussed DASH (Dietary Approaches to Stop Hypertension) DASH diet is lower in sodium than a typical American diet. Cut back on foods that are high in saturated fat, cholesterol, and trans fats. Eat more whole-grain foods, fish, poultry, and nuts Remain active and exercise as tolerated daily.  Monitor BP at home-Call if greater than 130/80.  Check CMP/CBC  Orders Placed This Encounter  Procedures   DG Chest 2 View    Standing Status:   Future    Standing Expiration Date:   02/22/2024    Order Specific Question:   Reason for Exam (SYMPTOM  OR DIAGNOSIS REQUIRED)    Answer:   URI x 4 weeks, unresolved, wheezing, DOE, cough    Order Specific Question:   Preferred imaging location?    Answer:   [id number]   Meds ordered this encounter  Medications   doxycycline (VIBRA-TABS) 100 MG tablet    Sig: Take 1 tablet (100 mg total) by mouth 2 (two) times daily for 7 days.    Dispense:  14 tablet    Refill:  0    Order Specific Question:   Supervising Provider    Answer:   Lucky Cowboy [[id number]]   fluconazole (DIFLUCAN) 150 MG tablet     Sig: Take 1 tablet (150 mg) at the sign of symptoms.  If not resolved after 72 hours may take additional 150 mg dosage.    Dispense:  3 tablet    Refill:  0    Order Specific Question:   Supervising Provider    Answer:   Lucky Cowboy [phone]   ipratropium-albuterol (DUONEB) 0.5-2.5 (3) MG/3ML nebulizer solution 3 mL    Notify office for further evaluation and treatment, questions or concerns if s/s fail to improve. The risks and benefits of my recommendations, as well as other treatment options were discussed with the patient today. Questions were answered.  Further disposition pending results of labs. Discussed med's effects and SE's.    Over 25 minutes of exam, counseling, chart review, and critical decision making was performed.   Future Appointments  Date Time Provider Department Center  03/15/2023  3:30 PM Adela Glimpse, NP [facility] None  11/23/2023  3:00 PM Legrand Lasser, Archie Patten, NP [facility] None    ------------------------------------------------------------------------------------------------------------------   HPI BP 110/74   Pulse 91   Temp 97.8 F (36.6 C)   Ht 5' 5.5" (1.664 m)   Wt 260 lb (117.9 kg)   SpO2 97%   BMI 42.61 kg/m    Patient continues to complain of symptoms of a URI, possible sinusitis. Symptoms include achiness, congestion, cough described as productive, fever 99.9 F, headache described as throbbing, nasal congestion, shortness of breath, and wheezing after returning from [place] on vacation.  She has  not had the symptoms for 3 weeks with one round treatment of a Z-Pak which has not improved symptoms.  She does admit to now taking the last dose of the Azithromycin.  She is also using Benzonatate perles during the day and promethazine cough syrup at night.  She is trying to stay well hydrated.  States that she gets a vaginal yeast infection when taking long term abx.  States that she has not been drinking alcohol or taking in much food during  this time. She denies any withdraw symptoms from not drinking alcohol.  BMI is Body mass index is 42.61 kg/m., she has been working on diet and exercise. Wt Readings from Last 3 Encounters:  02/22/23 260 lb (117.9 kg)  02/14/23 254 lb (115.2 kg)  01/24/23 274 lb 3.2 oz (124.4 kg)   BP is stable today.  She has been continuing BP log in home and reports BP has been well controlled and averating <130/90  BP Readings from Last 3 Encounters:  02/22/23 110/74  02/14/23 108/64  01/24/23 120/84    Past Medical History:  Diagnosis Date   Allergy    Anemia    Anxiety    Arthritis    Depression    Fibromyalgia    HTN (hypertension)    MRSA (methicillin resistant Staphylococcus aureus)    Obese    Plantar fasciitis    Pneumonia    Seasonal allergies    Sleep apnea 2012   took test in [place]-told to use cpap-could not ude     No Known Allergies  Current Outpatient Medications on File Prior to Visit  Medication Sig   amLODipine (NORVASC) 5 MG tablet Take 1 tablet (5 mg total) by mouth daily.   benzonatate (TESSALON PERLES) 100 MG capsule Take 1 capsule (100 mg total) by mouth 3 (three) times daily as needed for cough.   BIOTIN PO Take by mouth.   Cyanocobalamin (B-12 PO) Take by mouth.   DULoxetine (CYMBALTA) 60 MG capsule Take 1 capsule (60 mg total) by mouth daily.   EPINEPHrine 0.3 mg/0.3 mL IJ SOAJ injection Inject 0.3 mLs (0.3 mg total) into the muscle once.   ferrous sulfate 325 (65 FE) MG tablet Take 1 tablet (325 mg total) by mouth daily.   hydrochlorothiazide (HYDRODIURIL) 12.5 MG tablet TAKE 1 TABLET BY MOUTH EVERY DAY   lisinopril (ZESTRIL) 10 MG tablet Take 1 tablet daily for BP   Magnesium 400 MG TABS Take by mouth daily.   metaxalone (SKELAXIN) 800 MG tablet Take 800 mg by mouth 3 (three) times daily.   [START ON 03/20/2023] morphine (MS CONTIN) 30 MG 12 hr tablet Take 1 tablet (30 mg total) by mouth every 8 (eight) hours. (fill 03/20/23)   POTASSIUM PO Take by  mouth.   promethazine-dextromethorphan (PROMETHAZINE-DM) 6.25-15 MG/5ML syrup Take 5 mLs by mouth 4 (four) times daily as needed for cough.   SYMPROIC 0.2 MG TABS Take 1 tablet by mouth daily.   tiZANidine (ZANAFLEX) 2 MG tablet Take 1 tablet (2 mg total) by mouth 3 (three) times daily as needed (stop metaxolone)   traZODone (DESYREL) 100 MG tablet TAKE 1 TABLET BY MOUTH EVERYDAY AT BEDTIME   azithromycin (ZITHROMAX) 250 MG tablet Take 2 tablets on  Day 1,  followed by 1 tablet  daily for 4 more days    for Sinusitis  /Bronchitis (Patient not taking: Reported on 02/22/2023)   metaxalone (SKELAXIN) 800 MG tablet Take 1 tablet (800 mg total) by mouth 3 (  three) times daily as needed.   morphine (MS CONTIN) 30 MG 12 hr tablet Take 1 tablet (30 mg total) by mouth every 8 (eight) hours.   morphine (MS CONTIN) 30 MG 12 hr tablet Take 1 tablet (30 mg total) by mouth every 8 (eight) hours. (fill 02/20/23)   phentermine 37.5 MG capsule TAKE 1 CAPSULE BY MOUTH EVERY MORNING (NOT COVERED) (Patient not taking: Reported on 01/04/2023)   Semaglutide-Weight Management (WEGOVY) 0.25 MG/0.5ML SOAJ INJECT 0.25MG  INTO THE SKIN ONE TIME PER WEEK (Patient not taking: Reported on 02/14/2023)   tiZANidine (ZANAFLEX) 2 MG tablet Take 1 tablet (2 mg total) by mouth 3 (three) times daily as needed (stop metaxolone)   Current Facility-Administered Medications on File Prior to Visit  Medication   ipratropium-albuterol (DUONEB) 0.5-2.5 (3) MG/3ML nebulizer solution 3 mL    ROS: all negative except what is noted in the HPI.   Physical Exam:  BP 110/74   Pulse 91   Temp 97.8 F (36.6 C)   Ht 5' 5.5" (1.664 m)   Wt 260 lb (117.9 kg)   SpO2 97%   BMI 42.61 kg/m   General Appearance: NAD.  Awake, conversant and cooperative. Eyes: PERRLA, EOMs intact.  Sclera white.  Conjunctiva without erythema. Sinuses: No frontal/maxillary tenderness.  No nasal discharge. Nares patent.  ENT/Mouth: Ext aud canals clear.  Bilateral TMs  w/DOL and without erythema or bulging. Hearing intact.  Posterior pharynx without swelling or exudate.  Tonsils without swelling or erythema.  Neck: Supple.  No masses, nodules or thyromegaly. Respiratory: Effort is regular with non-labored breathing. Breath sounds are equal bilaterally with scattered wheezing throughout let and right anterior lung field upon expiraiton. Cardio: RRR with no MRGs. Brisk peripheral pulses without edema.  Abdomen: Active BS in all four quadrants.  Soft and non-tender without guarding, rebound tenderness, hernias or masses. Lymphatics: Non tender without lymphadenopathy.  Musculoskeletal: Full ROM, 5/5 strength, normal ambulation.  No clubbing or cyanosis. Skin: Appropriate color for ethnicity. Warm without rashes, lesions, ecchymosis, ulcers.  Neuro: CN II-XII grossly normal. Normal muscle tone without cerebellar symptoms and intact sensation.   Psych: AO X 3,  appropriate mood and affect, insight and judgment.     Adela Glimpse, NP 4:17 PM [facility]

## 2023-02-23 ENCOUNTER — Ambulatory Visit: Payer: HMO | Admitting: Nurse Practitioner

## 2023-02-25 ENCOUNTER — Other Ambulatory Visit (HOSPITAL_COMMUNITY): Payer: Self-pay

## 2023-03-02 DIAGNOSIS — L4050 Arthropathic psoriasis, unspecified: Secondary | ICD-10-CM | POA: Diagnosis not present

## 2023-03-15 ENCOUNTER — Ambulatory Visit: Payer: HMO | Admitting: Nurse Practitioner

## 2023-04-02 ENCOUNTER — Other Ambulatory Visit: Payer: Self-pay | Admitting: Nurse Practitioner

## 2023-04-02 DIAGNOSIS — D509 Iron deficiency anemia, unspecified: Secondary | ICD-10-CM

## 2023-04-04 ENCOUNTER — Other Ambulatory Visit (HOSPITAL_COMMUNITY): Payer: Self-pay

## 2023-04-04 DIAGNOSIS — L4050 Arthropathic psoriasis, unspecified: Secondary | ICD-10-CM | POA: Diagnosis not present

## 2023-05-02 DIAGNOSIS — L4050 Arthropathic psoriasis, unspecified: Secondary | ICD-10-CM | POA: Diagnosis not present

## 2023-05-11 ENCOUNTER — Other Ambulatory Visit (HOSPITAL_COMMUNITY): Payer: Self-pay

## 2023-05-11 DIAGNOSIS — G894 Chronic pain syndrome: Secondary | ICD-10-CM | POA: Diagnosis not present

## 2023-05-11 DIAGNOSIS — M62831 Muscle spasm of calf: Secondary | ICD-10-CM | POA: Diagnosis not present

## 2023-05-11 DIAGNOSIS — M150 Primary generalized (osteo)arthritis: Secondary | ICD-10-CM | POA: Diagnosis not present

## 2023-05-11 DIAGNOSIS — M25551 Pain in right hip: Secondary | ICD-10-CM | POA: Diagnosis not present

## 2023-05-11 MED ORDER — TRAZODONE HCL 50 MG PO TABS
50.0000 mg | ORAL_TABLET | Freq: Every evening | ORAL | 2 refills | Status: DC
  Filled 2023-05-11: qty 30, 30d supply, fill #0

## 2023-05-11 MED ORDER — MORPHINE SULFATE ER 30 MG PO TBCR
30.0000 mg | EXTENDED_RELEASE_TABLET | Freq: Three times a day (TID) | ORAL | 0 refills | Status: DC
  Filled 2023-05-11: qty 90, 30d supply, fill #0

## 2023-05-11 MED ORDER — TIZANIDINE HCL 2 MG PO TABS
2.0000 mg | ORAL_TABLET | Freq: Three times a day (TID) | ORAL | 1 refills | Status: DC
  Filled 2023-05-11: qty 90, 30d supply, fill #0
  Filled 2023-06-27: qty 90, 30d supply, fill #1

## 2023-05-12 ENCOUNTER — Other Ambulatory Visit (HOSPITAL_COMMUNITY): Payer: Self-pay

## 2023-05-15 ENCOUNTER — Other Ambulatory Visit (HOSPITAL_COMMUNITY): Payer: Self-pay

## 2023-05-29 ENCOUNTER — Other Ambulatory Visit: Payer: Self-pay | Admitting: Nurse Practitioner

## 2023-05-29 DIAGNOSIS — I10 Essential (primary) hypertension: Secondary | ICD-10-CM

## 2023-06-01 DIAGNOSIS — L4050 Arthropathic psoriasis, unspecified: Secondary | ICD-10-CM | POA: Diagnosis not present

## 2023-06-27 ENCOUNTER — Other Ambulatory Visit (HOSPITAL_COMMUNITY): Payer: Self-pay

## 2023-06-27 MED ORDER — TIZANIDINE HCL 2 MG PO TABS
2.0000 mg | ORAL_TABLET | Freq: Three times a day (TID) | ORAL | 1 refills | Status: DC | PRN
  Filled 2023-06-27 – 2023-07-28 (×2): qty 90, 30d supply, fill #0
  Filled 2023-08-29: qty 90, 30d supply, fill #1

## 2023-06-27 MED ORDER — MORPHINE SULFATE ER 30 MG PO TBCR
30.0000 mg | EXTENDED_RELEASE_TABLET | Freq: Three times a day (TID) | ORAL | 0 refills | Status: DC
  Filled 2023-06-27: qty 90, 30d supply, fill #0

## 2023-06-28 ENCOUNTER — Other Ambulatory Visit (HOSPITAL_COMMUNITY): Payer: Self-pay

## 2023-06-29 DIAGNOSIS — L4050 Arthropathic psoriasis, unspecified: Secondary | ICD-10-CM | POA: Diagnosis not present

## 2023-07-05 ENCOUNTER — Other Ambulatory Visit: Payer: Self-pay | Admitting: Nurse Practitioner

## 2023-07-05 DIAGNOSIS — D509 Iron deficiency anemia, unspecified: Secondary | ICD-10-CM

## 2023-07-12 DIAGNOSIS — M25551 Pain in right hip: Secondary | ICD-10-CM | POA: Diagnosis not present

## 2023-07-12 DIAGNOSIS — M797 Fibromyalgia: Secondary | ICD-10-CM | POA: Diagnosis not present

## 2023-07-12 DIAGNOSIS — M79644 Pain in right finger(s): Secondary | ICD-10-CM | POA: Diagnosis not present

## 2023-07-12 DIAGNOSIS — Z872 Personal history of diseases of the skin and subcutaneous tissue: Secondary | ICD-10-CM | POA: Diagnosis not present

## 2023-07-12 DIAGNOSIS — M2550 Pain in unspecified joint: Secondary | ICD-10-CM | POA: Diagnosis not present

## 2023-07-12 DIAGNOSIS — L4050 Arthropathic psoriasis, unspecified: Secondary | ICD-10-CM | POA: Diagnosis not present

## 2023-07-12 DIAGNOSIS — Z79899 Other long term (current) drug therapy: Secondary | ICD-10-CM | POA: Diagnosis not present

## 2023-07-12 DIAGNOSIS — M549 Dorsalgia, unspecified: Secondary | ICD-10-CM | POA: Diagnosis not present

## 2023-07-12 DIAGNOSIS — M7060 Trochanteric bursitis, unspecified hip: Secondary | ICD-10-CM | POA: Diagnosis not present

## 2023-07-12 DIAGNOSIS — M1990 Unspecified osteoarthritis, unspecified site: Secondary | ICD-10-CM | POA: Diagnosis not present

## 2023-07-27 ENCOUNTER — Other Ambulatory Visit (HOSPITAL_COMMUNITY): Payer: Self-pay

## 2023-07-27 DIAGNOSIS — M25551 Pain in right hip: Secondary | ICD-10-CM | POA: Diagnosis not present

## 2023-07-27 DIAGNOSIS — M62831 Muscle spasm of calf: Secondary | ICD-10-CM | POA: Diagnosis not present

## 2023-07-27 DIAGNOSIS — M150 Primary generalized (osteo)arthritis: Secondary | ICD-10-CM | POA: Diagnosis not present

## 2023-07-27 DIAGNOSIS — G894 Chronic pain syndrome: Secondary | ICD-10-CM | POA: Diagnosis not present

## 2023-07-27 DIAGNOSIS — L4050 Arthropathic psoriasis, unspecified: Secondary | ICD-10-CM | POA: Diagnosis not present

## 2023-07-27 MED ORDER — MORPHINE SULFATE ER 30 MG PO TBCR: 30.0000 mg | EXTENDED_RELEASE_TABLET | Freq: Three times a day (TID) | ORAL | 0 refills | Status: DC

## 2023-07-27 MED ORDER — MORPHINE SULFATE ER 30 MG PO TBCR
30.0000 mg | EXTENDED_RELEASE_TABLET | Freq: Three times a day (TID) | ORAL | 0 refills | Status: DC
Start: 2023-07-27 — End: 2023-08-29
  Filled 2023-07-27: qty 90, 30d supply, fill #0

## 2023-07-28 ENCOUNTER — Other Ambulatory Visit (HOSPITAL_COMMUNITY): Payer: Self-pay

## 2023-08-08 ENCOUNTER — Ambulatory Visit (INDEPENDENT_AMBULATORY_CARE_PROVIDER_SITE_OTHER): Payer: HMO | Admitting: Nurse Practitioner

## 2023-08-08 ENCOUNTER — Encounter: Payer: Self-pay | Admitting: Nurse Practitioner

## 2023-08-08 VITALS — BP 134/83 | HR 61 | Temp 97.7°F | Ht 65.5 in | Wt 266.0 lb

## 2023-08-08 DIAGNOSIS — I10 Essential (primary) hypertension: Secondary | ICD-10-CM | POA: Diagnosis not present

## 2023-08-08 DIAGNOSIS — Z6841 Body Mass Index (BMI) 40.0 and over, adult: Secondary | ICD-10-CM

## 2023-08-08 DIAGNOSIS — Z79899 Other long term (current) drug therapy: Secondary | ICD-10-CM | POA: Diagnosis not present

## 2023-08-08 DIAGNOSIS — Z1389 Encounter for screening for other disorder: Secondary | ICD-10-CM | POA: Diagnosis not present

## 2023-08-08 DIAGNOSIS — R198 Other specified symptoms and signs involving the digestive system and abdomen: Secondary | ICD-10-CM

## 2023-08-08 DIAGNOSIS — Z13220 Encounter for screening for lipoid disorders: Secondary | ICD-10-CM | POA: Diagnosis not present

## 2023-08-08 DIAGNOSIS — R252 Cramp and spasm: Secondary | ICD-10-CM | POA: Diagnosis not present

## 2023-08-08 DIAGNOSIS — Z23 Encounter for immunization: Secondary | ICD-10-CM | POA: Diagnosis not present

## 2023-08-08 DIAGNOSIS — N179 Acute kidney failure, unspecified: Secondary | ICD-10-CM | POA: Diagnosis not present

## 2023-08-08 DIAGNOSIS — Z131 Encounter for screening for diabetes mellitus: Secondary | ICD-10-CM

## 2023-08-08 DIAGNOSIS — R300 Dysuria: Secondary | ICD-10-CM | POA: Diagnosis not present

## 2023-08-08 DIAGNOSIS — F1010 Alcohol abuse, uncomplicated: Secondary | ICD-10-CM

## 2023-08-08 MED ORDER — PHENTERMINE HCL 37.5 MG PO TABS
ORAL_TABLET | ORAL | 0 refills | Status: DC
Start: 2023-08-08 — End: 2024-03-11

## 2023-08-08 NOTE — Patient Instructions (Signed)
    RE: MyChart  Dear Ms. [employer] makes it easy for you to view your health information - all in one secure location - from any computer or mobile device at any time. Use the activation code below to enroll in MyChart online at [email or web address]   Once your account is activated, you can:  View your test results. Communicate securely with your physician's office.  View your medical history, allergies, medications, and immunizations. Receive care virtually through an e-Visit.   If you are over 18, you may use features of MyChart to manage the health information of your spouse, children or others you care for.  Download child and adult access forms at [email or web address].    As you activate your MyChart account and need any technical assistance, please call the MyChart technical support line at [phone]).  Be sure to also download the MyChart app for your mobile device.   Thank you for choosing  for your family's health care needs!   MyChart Activation Code:  [email or web address] Expires: 09/21/2023  3:46 PM             [facility]  [address] [place], [place] [address]

## 2023-08-08 NOTE — Progress Notes (Signed)
Assessment and Plan:  [name] was seen today for an episodic visit.  Diagnoses and all order for this visit:  Essential hypertension Currently well controlled. Discussed DASH (Dietary Approaches to Stop Hypertension) DASH diet is lower in sodium than a typical American diet. Cut back on foods that are high in saturated fat, cholesterol, and trans fats. Eat more whole-grain foods, fish, poultry, and nuts Remain active and exercise as tolerated daily.  Monitor BP at home-Call if greater than 130/80.  Check CMP/CBC  - CBC with Differential/Platelet - COMPLETE METABOLIC PANEL WITH GFR  Morbid obesity with BMI of 40.0-44.9, adult (HCC) Phentermine refilled. Discussed appropriate BMI Diet modification. Physical activity. Encouraged/praised to build confidence.  - phentermine (ADIPEX-P) 37.5 MG tablet; Take 1/2 to 1 tablet every Morning for Dieting & Weight Loss  Dispense: 30 tablet; Refill: 0  AKI (acute kidney injury) (HCC) Discussed how what you eat and drink can aide in kidney protection. Stay well hydrated. Avoid high salt foods. Avoid NSAIDS. Keep BP well controlled.   Take medications as prescribed. Remain active and exercise as tolerated daily. Maintain weight.  Continue to monitor. Check CMP/GFR  - COMPLETE METABOLIC PANEL WITH GFR  Alcohol abuse Discussed benefit of reducing alcohol intake. Alcohol cessation discussed.  - CBC with Differential/Platelet - COMPLETE METABOLIC PANEL WITH GFR  Change in bowel movement Stool card provided Discussed H. Pylori versus ova/parasite review given blood work stable.  - CBC with Differential/Platelet - COMPLETE METABOLIC PANEL WITH GFR  Dysuria Stay well hydrated to keep urinary system well flushed Consider daily cranberry juice or oral supplement to help any bacteria from adhering to bladder wall causing increase for infection.  - Urinalysis, Routine w reflex microscopic - Urine Culture  Needs flu  shot Administered - patient tolerated well.  - Flu vaccine HIGH DOSE PF  Screening for cholesterol level  - Lipid panel  Medication management All medications discussed and reviewed in full. All questions and concerns regarding medications addressed.    - CBC with Differential/Platelet - COMPLETE METABOLIC PANEL WITH GFR - Magnesium - Lipid panel - Hemoglobin A1c - Urinalysis, Routine w reflex microscopic - Urine Culture  Cramp in lower leg  - Magnesium  Screening for diabetes mellitus  - Hemoglobin A1c  Screening for hematuria or proteinuria  - Urinalysis, Routine w reflex microscopic - Urine Culture  Notify office for further evaluation and treatment, questions or concerns if s/s fail to improve. The risks and benefits of my recommendations, as well as other treatment options were discussed with the patient today. Questions were answered.  Notify office for further evaluation and treatment, questions or concerns if any reported s/s fail to improve.   The patient was advised to call back or seek an in-person evaluation if any symptoms worsen or if the condition fails to improve as anticipated.   Further disposition pending results of labs. Discussed med's effects and SE's.    I discussed the assessment and treatment plan with the patient. The patient was provided an opportunity to ask questions and all were answered. The patient agreed with the plan and demonstrated an understanding of the instructions.  Discussed med's effects and SE's. Screening labs and tests as requested with regular follow-up as recommended.  I provided 30 minutes of face-to-face time during this encounter including counseling, chart review, and critical decision making was preformed.  Today's Plan of Care is based on a patient-centered health care approach known as shared decision making - the decisions, tests and treatments  allow for patient preferences and values to be balanced with clinical  evidence.     Future Appointments  Date Time Provider Department Center  11/23/2023  3:00 PM Lorell Thibodaux, Archie Patten, NP [facility] None    ------------------------------------------------------------------------------------------------------------------   HPI BP 134/83   Pulse 61   Temp 97.7 F (36.5 C)   Ht 5' 5.5" (1.664 m)   Wt 266 lb (120.7 kg)   SpO2 98%   BMI 43.59 kg/m   67 y.o.female presents for overall general follow up.  She has a new concern for dark green colored stool.  She does drink alcohol daily but reports decreasing her amount to 1-3 drinks in the evening.  She denies any new medications, supplements or foods.  She has had some recent travel across [place] and the states but has not been out of the country.  She denies fever, chills, N/V, melena.   She follows with Rheumatology, Dr. Sander Nephew for psoriatic arthritis- she is on Cimza injections. Recently had lab work completed that revealed low hemoglobin.  Anemia panel was obtained and patient was asked to start ferrous sulfate.     She continues to be followed by Dr. Vear Clock who is pain medicine.    She is on Cymbalta for depression and pain.  Reports effectiveness.   She has a hx of vascular disease with a venous stasis ulcer that has appeared on her LLE.  She has been applying sugar/betadine slurry with significant healing. She continues to be morbidly obese.  Weight gain is increasing.  Continues to drink alcohol daily with a hx of alcohol use disorder.  BMI is Body mass index is 43.59 kg/m., she has not been working on diet and exercise.  She has been successful with weight loss in the past using Phentermine.  Wt Readings from Last 3 Encounters:  08/08/23 266 lb (120.7 kg)  02/22/23 260 lb (117.9 kg)  02/14/23 254 lb (115.2 kg)   She does not check her BP at home, No longer taking Amlodipine.  Continues HCTZ and Lisinopril BP Readings from Last 3 Encounters:  08/08/23 134/83  02/22/23 110/74  02/14/23 108/64    She is not on cholesterol medication and denies myalgias. Her cholesterol is at goal. The cholesterol last visit was:   Lab Results  Component Value Date   CHOL 175 08/08/2023   HDL 80 08/08/2023   LDLCALC 81 08/08/2023   TRIG 63 08/08/2023   CHOLHDL 2.2 08/08/2023   She has not been working on diet and exercise for abnormal glucose . Last A1C in the office was:  Lab Results  Component Value Date   HGBA1C 5.6 08/08/2023   She does have a history of anemia. She is no longer on an iron supplement. Last review of hemoglobin was low. She denies fatigue, does have SOB, feels its contributed to weight.   She admits to not drinking much water. She also drinks diet mountain dew. She is trying to drink more water and limit soda intake.  Lab Results  Component Value Date   EGFR 63 08/08/2023   Past Medical History:  Diagnosis Date   Allergy    Anemia    Anxiety    Arthritis    Depression    Fibromyalgia    HTN (hypertension)    MRSA (methicillin resistant Staphylococcus aureus)    Obese    Plantar fasciitis    Pneumonia    Seasonal allergies    Sleep apnea 2012   took test in [place]-told to use  cpap-could not ude     No Known Allergies  Current Outpatient Medications on File Prior to Visit  Medication Sig   BIOTIN PO Take by mouth.   Cyanocobalamin (B-12 PO) Take by mouth.   ferrous sulfate 325 (65 FE) MG tablet TAKE 1 TABLET BY MOUTH EVERY DAY   hydrochlorothiazide (HYDRODIURIL) 12.5 MG tablet TAKE 1 TABLET BY MOUTH EVERY DAY   lisinopril (ZESTRIL) 10 MG tablet Take 1 tablet daily for BP   Magnesium 400 MG TABS Take by mouth daily.   morphine (MS CONTIN) 30 MG 12 hr tablet Take 1 tablet (30 mg total) by mouth every 8 (eight) hours.   POTASSIUM PO Take by mouth.   SYMPROIC 0.2 MG TABS Take 1 tablet by mouth daily.   tiZANidine (ZANAFLEX) 2 MG tablet Take 1 tablet (2 mg total) by mouth 3 (three) times daily as needed (stop metaxolone)   tiZANidine (ZANAFLEX) 2 MG  tablet Take 1 tablet (2 mg total) by mouth 3 (three) times daily as needed. Stop metaxolone.   traZODone (DESYREL) 100 MG tablet TAKE 1 TABLET BY MOUTH EVERYDAY AT BEDTIME   amLODipine (NORVASC) 5 MG tablet Take 1 tablet (5 mg total) by mouth daily.   azithromycin (ZITHROMAX) 250 MG tablet Take 2 tablets on  Day 1,  followed by 1 tablet  daily for 4 more days    for Sinusitis  /Bronchitis   benzonatate (TESSALON PERLES) 100 MG capsule Take 1 capsule (100 mg total) by mouth 3 (three) times daily as needed for cough.   DULoxetine (CYMBALTA) 60 MG capsule Take 1 capsule (60 mg total) by mouth daily. (Patient not taking: Reported on 08/08/2023)   EPINEPHrine 0.3 mg/0.3 mL IJ SOAJ injection Inject 0.3 mLs (0.3 mg total) into the muscle once. (Patient not taking: Reported on 08/08/2023)   fluconazole (DIFLUCAN) 150 MG tablet Take 1 tablet (150 mg) at the sign of symptoms.  If not resolved after 72 hours may take additional 150 mg dosage.   metaxalone (SKELAXIN) 800 MG tablet Take 800 mg by mouth 3 (three) times daily.   metaxalone (SKELAXIN) 800 MG tablet Take 1 tablet (800 mg total) by mouth 3 (three) times daily as needed.   morphine (MS CONTIN) 30 MG 12 hr tablet Take 1 tablet (30 mg total) by mouth every 8 (eight) hours.   morphine (MS CONTIN) 30 MG 12 hr tablet Take 1 tablet (30 mg) by mouth every 8 hours. (fill 02/20/23)   [START ON 08/25/2023] morphine (MS CONTIN) 30 MG 12 hr tablet Take 1 tablet (30 mg total) by mouth every 8 (eight) hours. (fill 08/25/23)   phentermine 37.5 MG capsule TAKE 1 CAPSULE BY MOUTH EVERY MORNING (NOT COVERED) (Patient not taking: Reported on 01/04/2023)   promethazine-dextromethorphan (PROMETHAZINE-DM) 6.25-15 MG/5ML syrup Take 5 mLs by mouth 4 (four) times daily as needed for cough.   Semaglutide-Weight Management (WEGOVY) 0.25 MG/0.5ML SOAJ INJECT 0.25MG  INTO THE SKIN ONE TIME PER WEEK (Patient not taking: Reported on 02/14/2023)   tiZANidine (ZANAFLEX) 2 MG tablet Take 1  tablet (2 mg total) by mouth 3 (three) times daily as needed (stop metaxolone)   tiZANidine (ZANAFLEX) 2 MG tablet Take 1 tablet by mouth 3 times a day as needed-- stop metaxolone   traZODone (DESYREL) 50 MG tablet Take 1 tablet (50 mg) by mouth at bedtime.   Current Facility-Administered Medications on File Prior to Visit  Medication   ipratropium-albuterol (DUONEB) 0.5-2.5 (3) MG/3ML nebulizer solution 3 mL  ROS: all negative except what is noted in the HPI.   Physical Exam:  BP 134/83   Pulse 61   Temp 97.7 F (36.5 C)   Ht 5' 5.5" (1.664 m)   Wt 266 lb (120.7 kg)   SpO2 98%   BMI 43.59 kg/m   General Appearance: NAD.  Awake, conversant and cooperative. Eyes: PERRLA, EOMs intact.  Sclera white.  Conjunctiva without erythema. Sinuses: No frontal/maxillary tenderness.  No nasal discharge. Nares patent.  ENT/Mouth: Ext aud canals clear.  Bilateral TMs w/DOL and without erythema or bulging. Hearing intact.  Posterior pharynx without swelling or exudate.  Tonsils without swelling or erythema.  Neck: Supple.  No masses, nodules or thyromegaly. Respiratory: Effort is regular with non-labored breathing. Breath sounds are equal bilaterally without rales, rhonchi, wheezing or stridor.  Cardio: RRR with no MRGs. Brisk peripheral pulses without edema.  Abdomen: Active BS in all four quadrants.  Soft and non-tender without guarding, rebound tenderness, hernias or masses. Lymphatics: Non tender without lymphadenopathy.  Musculoskeletal: Full ROM, 5/5 strength, normal ambulation.  No clubbing or cyanosis. Skin: Appropriate color for ethnicity. Warm without rashes, lesions, ecchymosis, ulcers.  Neuro: CN II-XII grossly normal. Normal muscle tone without cerebellar symptoms and intact sensation.   Psych: AO X 3,  appropriate mood and affect, insight and judgment.     Adela Glimpse, NP 2:41 PM [facility]

## 2023-08-09 LAB — URINE CULTURE
MICRO NUMBER:: 15597806
Result:: NO GROWTH
SPECIMEN QUALITY:: ADEQUATE

## 2023-08-09 LAB — CBC WITH DIFFERENTIAL/PLATELET
Absolute Lymphocytes: 2568 {cells}/uL (ref 850–3900)
Absolute Monocytes: 519 {cells}/uL (ref 200–950)
Basophils Absolute: 61 {cells}/uL (ref 0–200)
Basophils Relative: 1 %
Eosinophils Absolute: 378 {cells}/uL (ref 15–500)
Eosinophils Relative: 6.2 %
HCT: 38.8 % (ref 35.0–45.0)
Hemoglobin: 12.7 g/dL (ref 11.7–15.5)
MCH: 31.9 pg (ref 27.0–33.0)
MCHC: 32.7 g/dL (ref 32.0–36.0)
MCV: 97.5 fL (ref 80.0–100.0)
MPV: 9.7 fL (ref 7.5–12.5)
Monocytes Relative: 8.5 %
Neutro Abs: 2574 {cells}/uL (ref 1500–7800)
Neutrophils Relative %: 42.2 %
Platelets: 308 10*3/uL (ref 140–400)
RBC: 3.98 10*6/uL (ref 3.80–5.10)
RDW: 12.4 % (ref 11.0–15.0)
Total Lymphocyte: 42.1 %
WBC: 6.1 10*3/uL (ref 3.8–10.8)

## 2023-08-09 LAB — URINALYSIS, ROUTINE W REFLEX MICROSCOPIC
Bilirubin Urine: NEGATIVE
Glucose, UA: NEGATIVE
Hgb urine dipstick: NEGATIVE
Hyaline Cast: NONE SEEN /[LPF]
Ketones, ur: NEGATIVE
Nitrite: NEGATIVE
Protein, ur: NEGATIVE
RBC / HPF: NONE SEEN /[HPF] (ref 0–2)
Specific Gravity, Urine: 1.01 (ref 1.001–1.035)
pH: 7.5 (ref 5.0–8.0)

## 2023-08-09 LAB — MAGNESIUM: Magnesium: 2.3 mg/dL (ref 1.5–2.5)

## 2023-08-09 LAB — COMPLETE METABOLIC PANEL WITH GFR
AG Ratio: 2 (calc) (ref 1.0–2.5)
ALT: 13 U/L (ref 6–29)
AST: 18 U/L (ref 10–35)
Albumin: 4.5 g/dL (ref 3.6–5.1)
Alkaline phosphatase (APISO): 69 U/L (ref 37–153)
BUN: 16 mg/dL (ref 7–25)
CO2: 30 mmol/L (ref 20–32)
Calcium: 9.4 mg/dL (ref 8.6–10.4)
Chloride: 96 mmol/L — ABNORMAL LOW (ref 98–110)
Creat: 0.99 mg/dL (ref 0.50–1.05)
Globulin: 2.3 g/dL (ref 1.9–3.7)
Glucose, Bld: 88 mg/dL (ref 65–99)
Potassium: 4.3 mmol/L (ref 3.5–5.3)
Sodium: 136 mmol/L (ref 135–146)
Total Bilirubin: 0.5 mg/dL (ref 0.2–1.2)
Total Protein: 6.8 g/dL (ref 6.1–8.1)
eGFR: 63 mL/min/{1.73_m2} (ref >=60)

## 2023-08-09 LAB — LIPID PANEL
Cholesterol: 175 mg/dL (ref <200)
HDL: 80 mg/dL (ref >=50)
LDL Cholesterol (Calc): 81 mg/dL
Non-HDL Cholesterol (Calc): 95 mg/dL (ref <130)
Total CHOL/HDL Ratio: 2.2 (calc) (ref <5.0)
Triglycerides: 63 mg/dL (ref <150)

## 2023-08-09 LAB — MICROSCOPIC MESSAGE

## 2023-08-09 LAB — HEMOGLOBIN A1C
Hgb A1c MFr Bld: 5.6 %{Hb} (ref <5.7)
Mean Plasma Glucose: 114 mg/dL
eAG (mmol/L): 6.3 mmol/L

## 2023-08-11 ENCOUNTER — Telehealth: Payer: Self-pay | Admitting: Nurse Practitioner

## 2023-08-11 NOTE — Telephone Encounter (Signed)
Patient is aware of lab results. Since culture came back showing no growth, should she still do the cranberry supplement? And if so, for how long?

## 2023-08-14 ENCOUNTER — Encounter: Payer: Self-pay | Admitting: Nurse Practitioner

## 2023-08-24 DIAGNOSIS — L4050 Arthropathic psoriasis, unspecified: Secondary | ICD-10-CM | POA: Diagnosis not present

## 2023-08-29 ENCOUNTER — Other Ambulatory Visit (HOSPITAL_COMMUNITY): Payer: Self-pay

## 2023-08-29 MED ORDER — MORPHINE SULFATE ER 30 MG PO TBCR
30.0000 mg | EXTENDED_RELEASE_TABLET | Freq: Three times a day (TID) | ORAL | 0 refills | Status: DC
Start: 2023-08-29 — End: 2023-09-26
  Filled 2023-08-29: qty 90, 30d supply, fill #0

## 2023-08-30 ENCOUNTER — Other Ambulatory Visit (HOSPITAL_COMMUNITY): Payer: Self-pay

## 2023-09-01 ENCOUNTER — Other Ambulatory Visit (HOSPITAL_COMMUNITY): Payer: Self-pay

## 2023-09-11 ENCOUNTER — Ambulatory Visit (INDEPENDENT_AMBULATORY_CARE_PROVIDER_SITE_OTHER): Payer: HMO

## 2023-09-11 ENCOUNTER — Encounter: Payer: Self-pay | Admitting: Podiatry

## 2023-09-11 ENCOUNTER — Ambulatory Visit (INDEPENDENT_AMBULATORY_CARE_PROVIDER_SITE_OTHER): Payer: HMO | Admitting: Podiatry

## 2023-09-11 VITALS — Ht 65.5 in | Wt 266.0 lb

## 2023-09-11 DIAGNOSIS — Q66222 Congenital metatarsus adductus, left foot: Secondary | ICD-10-CM

## 2023-09-11 DIAGNOSIS — M79671 Pain in right foot: Secondary | ICD-10-CM

## 2023-09-11 DIAGNOSIS — G609 Hereditary and idiopathic neuropathy, unspecified: Secondary | ICD-10-CM

## 2023-09-11 DIAGNOSIS — M79672 Pain in left foot: Secondary | ICD-10-CM | POA: Diagnosis not present

## 2023-09-11 DIAGNOSIS — I890 Lymphedema, not elsewhere classified: Secondary | ICD-10-CM | POA: Diagnosis not present

## 2023-09-11 DIAGNOSIS — M7731 Calcaneal spur, right foot: Secondary | ICD-10-CM

## 2023-09-11 DIAGNOSIS — M7732 Calcaneal spur, left foot: Secondary | ICD-10-CM | POA: Diagnosis not present

## 2023-09-11 DIAGNOSIS — I872 Venous insufficiency (chronic) (peripheral): Secondary | ICD-10-CM

## 2023-09-11 NOTE — Progress Notes (Unsigned)
Chief Complaint  Patient presents with   Foot Pain    Pt is here due to pain and burning sensation to the bottom of bilateral feet, pt states the pain begun 3 months ago with no injury. States sometime its hard to walk due to pain.   HPI: 67 y.o. female presents today with concern of burning sensation in the plantar aspect of both feet.  She states that this is constant and present all the time.  States that the sensation does not extend up the legs.  Notes that her symptoms have been present for 3 months.  As she is aware that she has swelling in her legs and is wearing compression anklets.  She states that she also sleeps with the anklets on.  As she does not wear knee-high compression stockings or socks because she has difficulty donning the stockings even with a donning aid.  She currently has a pain contract with her pain specialist and is on an opioid as well as Cymbalta.    The patient also is requesting a custom orthotics.  Past Medical History:  Diagnosis Date   Allergy    Anemia    Anxiety    Arthritis    Depression    Fibromyalgia    HTN (hypertension)    MRSA (methicillin resistant Staphylococcus aureus)    Obese    Plantar fasciitis    Pneumonia    Seasonal allergies    Sleep apnea 2012   took test in [place]-told to use cpap-could not ude    Past Surgical History:  Procedure Laterality Date   ANTERIOR CERVICAL DECOMP/DISCECTOMY FUSION  05/09/2008   C5-6, C6-7; with arthrodesis also   GASTRIC BYPASS  10/24/2000   KNEE ARTHROSCOPY  10/24/2002   rt and lt knee   ROTATOR CUFF REPAIR W/ DISTAL CLAVICLE EXCISION  01/06/2006   right   SHOULDER ARTHROSCOPY Left 10/24/2001   TONSILLECTOMY     TOTAL KNEE ARTHROPLASTY  03/29/2004   left   TOTAL KNEE ARTHROPLASTY  11/07/2003   right   TOTAL KNEE REVISION  07/30/2010   right; with lateral release   UPPER GASTROINTESTINAL ENDOSCOPY     No Known Allergies   Physical Exam: General: The patient is alert and  oriented x3 in no acute distress.  Dermatology:  Some hemosiderin deposition noted in the legs however there is tanning to the overall skin which makes it more difficult to observe the specific area/demarcation of hemosiderin.  No open lesions or blistering noted.  Vascular: Palpable pedal pulses bilaterally. Capillary refill within normal limits.  +1 pitting edema bilateral lower legs and ankles.  Multiple telangiectasias in both legs and ankles.  Neurological: Light touch sensation grossly intact bilateral feet.  Negative Tinel's sign with percussion of the posterior tibial nerve  Musculoskeletal Exam: Patient has a collapsing midfoot and resting calcaneal stance position bilateral.  Ankle dorsiflexion is less than 10 degrees with the knee extended bilateral.  Manual muscle testing 5/5 bilateral.  Radiographic Exam (bilateral foot, 3 weightbearing views, 09/11/2023):  Right foot: Normal osseous mineralization.  Decreased calcaneal inclination angle with inferior calcaneal spur present.  Increased talar declination angle.  Contracture of the third toe at the DIPJ with joint space narrowing. Left foot: Inferior and posterior calcaneal spurs.  Medial angulation of the third, fourth, and fifth toes at the DIPJ level.  Mild met adductus deformity noted  Assessment/Plan of Care: 1. Idiopathic peripheral neuropathy   2. Foot pain, bilateral   3. Lymphedema  4. Venous (peripheral) insufficiency   5. Calcaneal spur of right foot   6. Calcaneal spur, left foot   7. Metatarsus adductus of left foot     AMB REFERRAL TO PHYSICAL THERAPY  Discussed clinical findings with patient today.  Will refer patient for lymphedema therapy to decrease the swelling in her legs and get her to a point that she can get her compressions stockings on that would be of knee-high level using a donning device.  Was recommended in the order that she be set up with at home lymphedema pumps at the end of her lymphedema  therapy to be able to perform daily maintenance treatments for her bilateral lower extremity edema.  Patient was informed that wearing an anklet for overall edema control can actually make a risk for swelling and possible DVT above the compression device increased if it does not go above the bulk of the calf muscle belly.  She definitely should not sleep with the ankle compressive sleeves on.  Typically gravity will help reduce edema overnight while sleeping.  Will need to reach out to her pain management specialist since she has a pain contract with this provider.  He needs to be made aware that she is having chronic, constant peripheral neuropathic pain that is not being alleviated with her current pain medication or her Cymbalta.  I informed patient today that I would most likely be in violation of her pain agreement contract if I prescribed gabapentin or Lyrica, so we will need to defer back to her pain management specialist in hopes that he reaches out to the patient to discuss adjustment of her current medication regimen or add any other appropriate medication as he feels necessary to help with her chronic pain in her feet.  She would rather hold off on an EMG/NCV right now.  Agree that the patient would benefit from custom orthotics for the mild met adductus deformity, acquired flatfoot bilateral with calcaneal spurs present, and mild arthritic changes to the feet.  She stated that her insurance has changed to [facility] [employer] so we will need to send in a prior Auth request to see if they will cover these devices for the patient and reach out to the patient to schedule an orthotic consult with our pedorthist if her insurance will cover the devices.  It should be noted that significant time was taken during the face-to-face examination performing the exam, explaining treatment options, and also placing appropriate referrals and reaching out to pain management.  F/u prn   Clerance Lav, DPM,  FACFAS [facility]     [address] [place], [place] [address]                Office [phone]  Fax [phone]

## 2023-09-12 NOTE — Addendum Note (Signed)
Addended by: Lucia Estelle D on: 09/12/2023 06:58 PM   Modules accepted: Orders

## 2023-09-13 ENCOUNTER — Encounter: Payer: Self-pay | Admitting: Occupational Therapy

## 2023-09-13 ENCOUNTER — Ambulatory Visit: Payer: HMO | Attending: Podiatry | Admitting: Occupational Therapy

## 2023-09-13 DIAGNOSIS — I890 Lymphedema, not elsewhere classified: Secondary | ICD-10-CM | POA: Insufficient documentation

## 2023-09-13 NOTE — Therapy (Addendum)
OUTPATIENT OCCUPATIONAL THERAPY EVALUATION  LOWER EXTREMITY LYMPHEDEMA Patient Name: HAIDEN TOPPIN MRN: [id number] DOB:07-23-1956, 67 y.o., female Today's Date: 09/19/2023  END OF SESSION:   OT End of Session - 09/25/23 1257     Visit Number 1    Number of Visits 36    Date for OT Re-Evaluation 12/13/23    OT Start Time 0100    OT Stop Time 0215    OT Time Calculation (min) 75 min    Activity Tolerance Patient tolerated treatment well;No increased pain    Behavior During Therapy WFL for tasks assessed/performed                  Past Medical History:  Diagnosis Date   Allergy    Anemia    Anxiety    Arthritis    Depression    Fibromyalgia    HTN (hypertension)    MRSA (methicillin resistant Staphylococcus aureus)    Obese    Plantar fasciitis    Pneumonia    Seasonal allergies    Sleep apnea 2012   took test in [place]-told to use cpap-could not ude   Past Surgical History:  Procedure Laterality Date   ANTERIOR CERVICAL DECOMP/DISCECTOMY FUSION  05/09/2008   C5-6, C6-7; with arthrodesis also   GASTRIC BYPASS  10/24/2000   KNEE ARTHROSCOPY  10/24/2002   rt and lt knee   ROTATOR CUFF REPAIR W/ DISTAL CLAVICLE EXCISION  01/06/2006   right   SHOULDER ARTHROSCOPY Left 10/24/2001   TONSILLECTOMY     TOTAL KNEE ARTHROPLASTY  03/29/2004   left   TOTAL KNEE ARTHROPLASTY  11/07/2003   right   TOTAL KNEE REVISION  07/30/2010   right; with lateral release   UPPER GASTROINTESTINAL ENDOSCOPY     Patient Active Problem List   Diagnosis Date Noted   Essential hypertension 02/14/2022   Pneumonia 06/10/2017   CAP (community acquired pneumonia) 06/09/2017   AKI (acute kidney injury) (HCC) 06/09/2017   ADHD, 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   Insomnia    Cellulitis and abscess of leg 06/19/2016   Pyoderma 02/11/2016   Cellulitis of left lower extremity 01/09/2016   Psoriatic arthritis (HCC)    Cough    Wheezing    Chronic anemia 12/05/2015    Chronic venous insufficiency 12/04/2015   Hoarding disorder 03/05/2015   Alcohol abuse 11/21/2014   Anxiety state 11/21/2014   Morbid obesity with BMI of 40.0-44.9, adult (HCC) 11/21/2014   Epistaxis 09/24/2014   Bruising 05/03/2014   Hematemesis 03/07/2014   Depressive disorder 12/09/2013   GAD (generalized anxiety disorder) 09/17/2013   Fibromyalgia 07/22/2013    PCP: Adela Glimpse, NP  REFERRING PROVIDER: Lucia Estelle, DPM  REFERRING DIAG: I89.0  THERAPY DIAG:  Lymphedema, not elsewhere classified  Rationale for Evaluation and Treatment: Rehabilitation  ONSET DATE: "I've had leg swelling for years. I don't know when it started. " + family hx of leg swelling-mother  SUBJECTIVE:  SUBJECTIVE STATEMENT: MIALEE MCFARLANE is referred to Occupational Therapy by Lucia Estelle, DPM, for evaluation and treatment of BLE lymphedema. Pt reports insidious onset of bilateral leg swelling several years ago without known precipitating event/s. Pt has not previously undergone lymphedema treatment . She does not wear compression garments because she has difficulty donning and doffing them, even with assistive devices. Pt has had persistent  burning sensation in her feet for the last 3 months. OT and Pt discussed  etiology of Periferal neuropathy, and discussed how several conditions in her medical history may contribute to chronic limb swelling, including arthritis, fibromyalgia, HTN, Obesity, plantar fasciitis, and sleep apnea. Pt's goal for OT is to reduce swelling and associated pain and discomfort in her legs and feet.   PERTINENT HISTORY:   PAIN:  Are you having pain? Yes: NPRS scale: 04/02/09 Pain location: feet Pain description: burning Aggravating factors: walking Relieving factors:  elevation  PRECAUTIONS: Other: LYMPHEDEMA PRECAUTIONS  WEIGHT BEARING RESTRICTIONS: No  FALLS:  Has patient fallen in last 6 months? 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   OBJECTIVE: Note: Objective measures were completed at Evaluation unless otherwise noted.  COGNITION:  Overall cognitive status: frequent redirection needed during eval to stay on topic    POSTURE: WFL  LE ROM: grossly WFL  STRENGTH: grossly WFL   BLE COMPARATIVE LIMB VOLUMETRICS tba initial Rx visit  LANDMARK RIGHT    R LEG (A-D) N/A  R THIGH (E-G) ml  R FULL LIMB (A-G) ml  Limb Volume differential (LVD)  %  Volume change since initial %  Volume change overall V  (Blank rows = not tested)  LANDMARK LEFT    L LEG (A-D) N/A  L  THIGH (E-G) ml  L FULL LIMB (A-G) ml  Limb Volume differential (LVD)  %  Volume change since initial %  Volume change overall %  (Blank rows = not tested)   Mild, Stage  II, 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 Obesity  Skin  Description Hyper-Keratosis Peau' de Orange Shiny Tight Fibrotic/ Indurated Fatty Doughy Spongy/ boggy       x x  x   Skin dry Flaky WNL Macerated   mildly      Color Redness Varicosities Blanching Hemosiderin Stain Mottled   x     x   Odor Malodorous Yeast Fungal infection  WNL      x   Temperature Warm Cool wnl    x     Pitting Edema   1+ 2+ 3+ 4+ Non-pitting         x   Girth Symmetrical  Asymmetrical                   Distribution    x toes to popliteal     Stemmer Sign Positive Negative   +    Lymphorrhea History Of:  Present Absent     x    Wounds History Of Present Absent Venous Arterial Pressure Sheer     x  Signs of Infection Redness Warmth Erythema Acute Swelling Drainage Borders                    Sensation Light Touch Deep pressure Hypersensitivity Neuropathic pain   In Tact Impaired In tact Impaired Absent Impaired Plantar surfaces bilaterally   x  x  x      Nails WNL   Fungus nail dystrophy   x     Hair Growth Symmetrical Asymmetrical   x    Skin Creases Base of toes  Ankles   Base of Fingers knees       Abdominal pannus Thigh Lobules  Face/neck   x   x        TODAY'S TREATMENT:                                                                                                                                         DATE:   PATIENT EDUCATION:  Education details:  Person educated: Patient Education method: Programmer, multimedia, Facilities manager, and Handouts Education comprehension: verbalized understanding, returned demonstration, and needs further education  HOME EXERCISE PROGRAM: BLE lymphatic pumping there ex using- 1 sets of 10 reps, each exercise in order-  1-2 x daily, bilaterally Simple self MLD 1 x daily Daily skin care to increase hydration, skin mobility and decrease infection risk- can be done during MLD Compression wraps 23/7 until garment fitting complete   ASSESSMENT  CLINICAL IMPRESSION: Loney Balram is a 67 y.o. 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 Pt denies hx of known, chronic limb swelling in her family. She does not utilize compression garments, or a vasopneumatic  compression "pump" as conservative measures. Chronic limb swelling and associated pain limits functional performance in all occupational domains, including functional ambulation and mobility, self-care and basic and instrumental ADLs. It limits participation in leisure pursuits, productive activities and social participle[ation.   Pt will benefit from  skilled Occupational Therapy for both  Intensive and Self -management phases of Complete Decongestive Therapy (CDT) to reduce limb swelling and associated pain, to limit progression, to reduce infection risk , and to increase functional performance, social participation and quality of life. CDT include manual lymphatic drainage (MLD), skin care, therapeutic exercise and compression therapies- multilayer bandaging initially, one limb at a time,  and then appropriate compression garments, or alternatives in the final phase. Pt and caregivers who assist are trained to perform a;; aspects of Lymphedema self-care top ensure optimal self management over time. Without skilled  OT for CDT, further progression of lymphedema is certain and further functional decline is expected.   OBJECTIVE IMPAIRMENTS: decreased activity tolerance, decreased knowledge of condition, decreased knowledge of use of DME, increased edema, pain,  and chronic leg swelling .   ACTIVITY LIMITATIONS: limited functional ambulation and functional mobility 2/2 lymphedema -related foot and leg pain; standing, squatting, stairs, bathing, toileting, dressing, and hygiene/grooming  PARTICIPATION LIMITATIONS: meal prep, cooking, cleaning, driving, shopping, community activity, and volunteering  PERSONAL FACTORS: Time since onset of injury/illness/exacerbation are also affecting patient's functional outcome.   REHAB POTENTIAL: Good  CLINICAL DECISION MAKING: Evolving/moderate complexity  EVALUATION COMPLEXITY: Moderate   GOALS: Goals reviewed with patient? Yes  SHORT TERM GOALS: Target date:  4th OT Rx visit   Pt will demonstrate understanding of lymphedema precautions and prevention strategies with modified independence using a printed reference to identify at least 5 precautions and discussing how s/he may implement them into daily life to reduce risk of progression with extra time. Baseline:Max A Goal status: INITIAL  2.  Pt will be able to apply multilayer, knee length, gradient, compression wraps to one leg at a time with max caregiver assistance to decrease limb volume, to limit infection risk, and to limit lymphedema progression.  Baseline: Dependent Goal status: INITIAL  LONG TERM GOALS: Target date: 12/12/22  Given this patient's Intake score of TBD % on the functional outcomes FOTO tool, patient will experience an increase in function of 5 points to improve basic and instrumental ADLs performance, including lymphedema self-care.  Baseline: Max A Goal status: INITIAL  2.  Given this patient's Intake score of TBD % on the Lymphedema Life Impact Scale (LLIS), patient will experience a reduction of at least 5 points in her perceived level of functional impairment resulting from lymphedema to improve functional performance and quality of life (QOL). Baseline: 64.71% Goal status: INITIAL  3.  Pt will achieve at least a 10% volume reduction in B legs to return limb to typical size and shape, to limit infection risk and LE progression, to decrease pain, to improve function. Baseline: Dependent Goal status: INITIAL  4.  Pt will obtain proper compression garments/devices and achieve modified independence (extra time + assistive devices) with donning/doffing to optimize limb volume reductions and limit LE progression over time. Baseline:  Goal status: INITIAL   LONG TERM GOALS: Target date: 2/19//25    PLAN:  PT FREQUENCY: 2x/week  PT DURATION: 12 weeks  PLANNED INTERVENTIONS: 97110-Therapeutic exercises, 97530- Therapeutic activity, 97535- Self Care, 40981- Manual  therapy, Patient/Family education, Manual lymph drainage, Compression bandaging, DME instructions, and fitting with compression garments  Custom-made gradient compression garments and HOS devices are medically necessary because they are uniquely sized and shaped to fit the exact dimensions of the affected extremities, and to provide appropriate medical grade, graduated compression essential for optimally managing chronic, progressive lymphedema. Multiple custom compression garments are needed to ensure proper hygiene to limit infection risk. Custom compression garments should be replaced q 3-6 months When worn consistently for optimal lipo-lymphedema self-management over time. HOS devices, medically necessary to limit fibrosis buildup in tissue, should be replaced q 2 years and PRN when worn out.     Consider replacing basic , vasopneumatic compression "pump" with an advanced pneumatic, sequential, compression device, such as the Flexitouch, made by [employer]. The Flexitouch is medically necessary to facilitate improved lymphatic drainage by following specific lymphatic anatomy proximally to distally, including inguinal, deep abdominal, and/or axillary watersheds.   PLAN FOR NEXT SESSION:  BLE comparative limb volumetrics Multilayer compression bandaging one leg only Pt edu for LE self care    Loel Dubonnet, MS, OTR/L, CLT-LANA 09/19/23 8:45 PM

## 2023-09-14 ENCOUNTER — Telehealth: Payer: Self-pay | Admitting: Nurse Practitioner

## 2023-09-14 NOTE — Telephone Encounter (Signed)
Patient said she wants to start Zepbound. Please send to [employer]?

## 2023-09-19 DIAGNOSIS — Z1231 Encounter for screening mammogram for malignant neoplasm of breast: Secondary | ICD-10-CM | POA: Diagnosis not present

## 2023-09-19 LAB — HM MAMMOGRAPHY

## 2023-09-19 NOTE — Progress Notes (Signed)
Auth received for [employer]  Approval number [id number] Valid 09/13/23 through 12/12/2023 Will call to schedule

## 2023-09-20 ENCOUNTER — Encounter: Payer: Self-pay | Admitting: Nurse Practitioner

## 2023-09-25 ENCOUNTER — Encounter: Payer: Self-pay | Admitting: Occupational Therapy

## 2023-09-25 NOTE — Addendum Note (Signed)
Addended by: Judithann Sauger on: 09/25/2023 03:24 PM   Modules accepted: Orders

## 2023-09-26 ENCOUNTER — Other Ambulatory Visit (HOSPITAL_COMMUNITY): Payer: Self-pay

## 2023-09-26 DIAGNOSIS — M62831 Muscle spasm of calf: Secondary | ICD-10-CM | POA: Diagnosis not present

## 2023-09-26 DIAGNOSIS — M150 Primary generalized (osteo)arthritis: Secondary | ICD-10-CM | POA: Diagnosis not present

## 2023-09-26 DIAGNOSIS — G894 Chronic pain syndrome: Secondary | ICD-10-CM | POA: Diagnosis not present

## 2023-09-26 DIAGNOSIS — M25551 Pain in right hip: Secondary | ICD-10-CM | POA: Diagnosis not present

## 2023-09-26 DIAGNOSIS — L4050 Arthropathic psoriasis, unspecified: Secondary | ICD-10-CM | POA: Diagnosis not present

## 2023-09-26 MED ORDER — MORPHINE SULFATE ER 30 MG PO TBCR: 30.0000 mg | EXTENDED_RELEASE_TABLET | Freq: Three times a day (TID) | ORAL | 0 refills | Status: DC

## 2023-09-26 MED ORDER — TIZANIDINE HCL 2 MG PO TABS
2.0000 mg | ORAL_TABLET | Freq: Three times a day (TID) | ORAL | 2 refills | Status: DC | PRN
  Filled 2023-09-26: qty 90, 30d supply, fill #0
  Filled 2023-11-13: qty 90, 30d supply, fill #1
  Filled 2023-12-14: qty 90, 30d supply, fill #2

## 2023-09-26 MED ORDER — TRAZODONE HCL 50 MG PO TABS
50.0000 mg | ORAL_TABLET | Freq: Every day | ORAL | 2 refills | Status: DC
  Filled 2023-09-26: qty 30, 30d supply, fill #0
  Filled 2023-12-14: qty 30, 30d supply, fill #1

## 2023-09-26 MED ORDER — MORPHINE SULFATE ER 30 MG PO TBCR
30.0000 mg | EXTENDED_RELEASE_TABLET | Freq: Three times a day (TID) | ORAL | 0 refills | Status: DC
  Filled 2023-09-26: qty 90, 30d supply, fill #0

## 2023-09-28 ENCOUNTER — Other Ambulatory Visit: Payer: Self-pay | Admitting: Nurse Practitioner

## 2023-09-28 MED ORDER — TIRZEPATIDE-WEIGHT MANAGEMENT 2.5 MG/0.5ML ~~LOC~~ SOLN: 2.5000 mg | SUBCUTANEOUS | 2 refills | Status: DC

## 2023-10-02 ENCOUNTER — Other Ambulatory Visit: Payer: Self-pay | Admitting: Nurse Practitioner

## 2023-10-06 ENCOUNTER — Other Ambulatory Visit: Payer: Self-pay | Admitting: Nurse Practitioner

## 2023-10-06 DIAGNOSIS — D509 Iron deficiency anemia, unspecified: Secondary | ICD-10-CM

## 2023-10-24 ENCOUNTER — Ambulatory Visit: Payer: BC Managed Care – PPO | Admitting: Podiatry

## 2023-11-01 ENCOUNTER — Ambulatory Visit: Payer: HMO | Attending: Podiatry | Admitting: Occupational Therapy

## 2023-11-01 ENCOUNTER — Encounter: Payer: Self-pay | Admitting: Occupational Therapy

## 2023-11-01 DIAGNOSIS — I890 Lymphedema, not elsewhere classified: Secondary | ICD-10-CM | POA: Insufficient documentation

## 2023-11-01 NOTE — Therapy (Signed)
 OUTPATIENT OCCUPATIONAL THERAPY TREATMENT NOTE  LOWER EXTREMITY LYMPHEDEMA Patient Name: [name] MRN: [id number] DOB:05-Jul-1956, 68 y.o., female Today's Date: 11/02/2023  END OF SESSION:   OT End of Session - 11/01/23 1421     Visit Number 2    Number of Visits 36    Date for OT Re-Evaluation 12/13/23    OT Start Time 0205    OT Stop Time 0305    OT Time Calculation (min) 60 min    Activity Tolerance Patient tolerated treatment well;No increased pain    Behavior During Therapy WFL for tasks assessed/performed             Past Medical History:  Diagnosis Date   Allergy    Anemia    Anxiety    Arthritis    Depression    Fibromyalgia    HTN (hypertension)    MRSA (methicillin resistant Staphylococcus aureus)    Obese    Plantar fasciitis    Pneumonia    Seasonal allergies    Sleep apnea 2012   took test in [place]-told to use cpap-could not ude   Past Surgical History:  Procedure Laterality Date   ANTERIOR CERVICAL DECOMP/DISCECTOMY FUSION  05/09/2008   C5-6, C6-7; with arthrodesis also   GASTRIC BYPASS  10/24/2000   KNEE ARTHROSCOPY  10/24/2002   rt and lt knee   ROTATOR CUFF REPAIR W/ DISTAL CLAVICLE EXCISION  01/06/2006   right   SHOULDER ARTHROSCOPY Left 10/24/2001   TONSILLECTOMY     TOTAL KNEE ARTHROPLASTY  03/29/2004   left   TOTAL KNEE ARTHROPLASTY  11/07/2003   right   TOTAL KNEE REVISION  07/30/2010   right; with lateral release   UPPER GASTROINTESTINAL ENDOSCOPY     Patient Active Problem List   Diagnosis Date Noted   Essential hypertension 02/14/2022   Pneumonia 06/10/2017   CAP (community acquired pneumonia) 06/09/2017   AKI (acute kidney injury) (HCC) 06/09/2017   ADHD, 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   Insomnia    Cellulitis and abscess of leg 06/19/2016   Pyoderma 02/11/2016   Cellulitis of left lower extremity 01/09/2016   Psoriatic arthritis (HCC)    Cough    Wheezing    Chronic anemia 12/05/2015   Chronic  venous insufficiency 12/04/2015   Hoarding disorder 03/05/2015   Alcohol abuse 11/21/2014   Anxiety state 11/21/2014   Morbid obesity with BMI of 40.0-44.9, adult (HCC) 11/21/2014   Epistaxis 09/24/2014   Bruising 05/03/2014   Hematemesis 03/07/2014   Depressive disorder 12/09/2013   GAD (generalized anxiety disorder) 09/17/2013   Fibromyalgia 07/22/2013    PCP: Bascom Necessary, NP  REFERRING PROVIDER: Awanda Imperial, DPM  REFERRING DIAG: I89.0  THERAPY DIAG:  Lymphedema, not elsewhere classified  Rationale for Evaluation and Treatment: Rehabilitation  ONSET DATE: I've had leg swelling for years. I don't know when it started.  + family hx of leg swelling-mother  SUBJECTIVE:  SUBJECTIVE STATEMENT: [name] returns to OT to commence CDT to RLE. She is accompanied by her spouse, [name]. Pt has no new complaints since initial eval on 09/13/23. Pt reports BLE pain is unchanged since initial evaluation.  PERTINENT HISTORY: Pls see active problem list above  PAIN:  Are you having pain? Yes: NPRS scale: 12/03/08 Pain location: feet Pain description: burning Aggravating factors: walking Relieving factors: elevation  PRECAUTIONS: Other: LYMPHEDEMA PRECAUTIONS  WEIGHT BEARING RESTRICTIONS: No  FALLS:  Has patient fallen in last 6 months? 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   OBJECTIVE: Note: Objective measures were completed at Evaluation unless otherwise noted.  COGNITION:  Overall cognitive status: frequent redirection needed during eval to stay on topic    POSTURE: WFL  LE ROM: grossly WFL  STRENGTH: grossly WFL   BLE COMPARATIVE LIMB VOLUMETRICS INITIAL 11/01/23  LANDMARK RIGHT  (dominant)  R LEG (A-D) 4296.6 ml  R THIGH (E-G) ml  R FULL LIMB (A-G) ml  Limb Volume differential (LVD)  %  Volume change since initial %  Volume change overall V  (Blank rows = not tested)  LANDMARK LEFT    L LEG (A-D) 4829.1 ml  L  THIGH (E-G) ml  L FULL LIMB (A-G) ml  Limb Volume differential (LVD)   12.4%, L>R  Volume change since initial %  Volume change overall %  (Blank rows = not tested)   Mild, Stage  II, 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 Obesity  Skin  Description Hyper-Keratosis Peau' de Orange Shiny Tight Fibrotic/ Indurated Fatty Doughy Spongy/ boggy   x    Bilateral Chronic Lipo dermatosclerosis Hardened, thickened, leathery      Skin dry Flaky WNL Macerated   mildly x     Color Redness Varicosities Blanching Hemosiderin Stain Mottled   x x x  Severe bilaterally x   Odor Malodorous Yeast Fungal infection  WNL      x   Temperature Warm Cool wnl    x     Pitting Edema   1+ 2+ 3+ 4+ Non-pitting         x   Girth Symmetrical Asymmetrical                   Distribution    L>R toes to popliteal     Stemmer Sign Positive Negative   +    Lymphorrhea History Of:  Present Absent     x    Wounds History Of Present Absent Venous Arterial Pressure Sheer     x        Signs of Infection Redness Warmth Erythema Acute Swelling Drainage Borders                    Sensation Light Touch Deep pressure Hypersensitivity Neuropathic pain   In Tact Impaired In tact Impaired Absent Impaired Plantar surfaces bilaterally    x  x  x      Nails WNL   Fungus nail dystrophy  x   Hair Growth Symmetrical Asymmetrical   x    Skin Creases Base of toes  Ankles   Base of Fingers knees       Abdominal pannus Thigh Lobules  Face/neck   x           TODAY'S TREATMENT:                                                                                                                                         DATE:   PATIENT EDUCATION:  Continued Pt/ CG edu for lymphedema self care home program throughout session. Topics include outcome of comparative limb volumetrics- starting limb volume differentials (LVDs), technology and gradient techniques used for short stretch, multilayer compression wrapping, simple self-MLD, therapeutic lymphatic pumping exercises, skin/nail care, LE precautions,. compression garment recommendations and specifications, wear and care schedule and compression garment donning / doffing w assistive devices. Discussed progress towards all OT goals since commencing CDT. All questions answered to the Pt's satisfaction. Good return. Person educated: Patient  Education method: Explanation, Demonstration, and Handouts Education comprehension: verbalized understanding, returned demonstration, verbal cues required, and needs further education  HOME EXERCISE PROGRAM: BLE lymphatic pumping there ex using- 1 sets of 10 reps, each exercise in order-  1-2 x daily, bilaterally Simple self MLD 1 x daily Daily skin care to increase hydration, skin mobility and decrease infection risk- can be done during MLD Compression wraps 23/7 until garment fitting complete   ASSESSMENT  CLINICAL IMPRESSION:  Pt presents for day 1 of Intensive Phase CDT to RLE. Pt does not wear lace up shoes to clinic , as requested, so at end of session OT issued friction socks to be worn over RLE bandages. Initial volumetrics reveal a 12.4% limb volume differential (LVD) with the non dominant LLE volume greater than the R leg. Pt and  spouse educated on multilayer  compression wrapping technology at intro level and wraps applied below the knee on the left using 1 each 8, 10 and 12 cm wide x 5 meters long over rosidal foam and stockinett  using gradient techniques. Pt instructed to remove wraps if she experiences acute pain in the limb, or atypical sob. Pt AGREES TO BRING WRAPS TO NEXT CLINIC VISIT, AND T bringing lace up shoes to fit over wraps to limit falls risk. We're off to a good start. Cont as per POC.  (Initial Eval 09/13/23: Donise Woodle is a 68 y.o. 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 Pt denies hx of known, chronic limb swelling in her family. She does not utilize compression garments, or a vasopneumatic compression pump as conservative measures. Chronic limb swelling and associated pain limits functional performance in all  occupational domains, including functional ambulation and mobility, self-care and basic and instrumental ADLs. It limits participation in leisure pursuits, productive activities and social participle[ation.   Pt will benefit from  skilled Occupational Therapy for both  Intensive and Self -management phases of Complete Decongestive Therapy (CDT) to reduce limb swelling and associated pain, to limit progression, to reduce infection risk , and to increase functional performance, social participation and quality of life. CDT include manual lymphatic drainage (MLD), skin care, therapeutic exercise and compression therapies- multilayer bandaging initially, one limb at a time,  and then appropriate compression garments, or alternatives in the final phase. Pt and caregivers who assist are trained to perform a;; aspects of Lymphedema self-care top ensure optimal  self management over time. Without skilled  OT for CDT, further progression of lymphedema is certain and further functional decline is expected.)   OBJECTIVE IMPAIRMENTS: decreased activity tolerance, decreased knowledge of condition, decreased knowledge of use of DME, increased edema, pain, and chronic leg swelling .   ACTIVITY LIMITATIONS: limited functional ambulation and functional mobility 2/2 lymphedema -related foot and leg pain; standing, squatting, stairs, bathing, toileting, dressing, and hygiene/grooming  PARTICIPATION LIMITATIONS: meal prep, cooking, cleaning, driving, shopping, community activity, and volunteering  PERSONAL FACTORS: Time since onset of injury/illness/exacerbation are also affecting patient's functional outcome.   REHAB POTENTIAL: Good  CLINICAL DECISION MAKING: Evolving/moderate complexity  EVALUATION COMPLEXITY: Moderate   GOALS: Goals reviewed with patient? Yes  SHORT TERM GOALS: Target date: 4th OT Rx visit   Pt will demonstrate understanding of lymphedema precautions and prevention strategies with modified independence using a printed reference to identify at least 5 precautions and discussing how s/he may implement them into daily life to reduce risk of progression with extra time. Baseline:Max A Goal status: INITIAL  2.  Pt will be able to apply multilayer, knee length, gradient, compression wraps to one leg at a time with max caregiver assistance to decrease limb volume, to limit infection risk, and to limit lymphedema progression.  Baseline: Dependent Goal status: INITIAL  LONG TERM GOALS: Target date: 12/12/22  Given this patient's Intake score of TBD % on the functional outcomes FOTO tool, patient will experience an increase in function of 5 points to improve basic and instrumental ADLs performance, including lymphedema self-care.  Baseline: Max A Goal status: INITIAL  2.  Given this patient's Intake score of TBD % on the Lymphedema Life  Impact Scale (LLIS), patient will experience a reduction of at least 5 points in her perceived level of functional impairment resulting from lymphedema to improve functional performance and quality of life (QOL). Baseline: 64.71% Goal status: INITIAL  3.  Pt will achieve at least a 10% volume reduction in B legs to return limb to typical size and shape, to limit infection risk and LE progression, to decrease pain, to improve function. Baseline: Dependent Goal status: INITIAL  4.  Pt will obtain proper compression garments/devices and achieve modified independence (extra time + assistive devices) with donning/doffing to optimize limb volume reductions and limit LE progression over time. Baseline:  Goal status: INITIAL PLAN:  OT FREQUENCY: 2x/week  OT DURATION: 12 weeks  PLANNED INTERVENTIONS: 97110-Therapeutic exercises, 97530- Therapeutic activity, 97535- Self Care, 02859- Manual therapy, Patient/Family education, Manual lymph drainage, Compression bandaging, DME instructions, and fitting with compression garments  Custom-made gradient compression garments and HOS devices are medically necessary because they are uniquely sized and shaped to fit the exact dimensions of the affected extremities, and to provide appropriate medical grade, graduated compression  essential for optimally managing chronic, progressive lymphedema. Multiple custom compression garments are needed to ensure proper hygiene to limit infection risk. Custom compression garments should be replaced q 3-6 months When worn consistently for optimal lipo-lymphedema self-management over time. HOS devices, medically necessary to limit fibrosis buildup in tissue, should be replaced q 2 years and PRN when worn out.     Consider replacing basic , vasopneumatic compression pump with an advanced pneumatic, sequential, compression device, such as the Flexitouch, made by [employer]. The Flexitouch is medically necessary to facilitate  improved lymphatic drainage by following specific lymphatic anatomy proximally to distally, including inguinal, deep abdominal, and/or axillary watersheds.   PLAN FOR NEXT SESSION:  Multilayer compression bandaging one leg only Pt / family edu for LE self care: bandaging  Zebedee Dec, MS, OTR/L, CLT-LANA 11/02/23 8:34 AM

## 2023-11-02 ENCOUNTER — Emergency Department (HOSPITAL_COMMUNITY)
Admission: EM | Admit: 2023-11-02 | Discharge: 2023-11-02 | Disposition: A | Discharge status: Home / Self Care | Payer: HMO | Attending: Emergency Medicine | Admitting: Emergency Medicine

## 2023-11-02 ENCOUNTER — Encounter (HOSPITAL_COMMUNITY): Payer: Self-pay | Admitting: Emergency Medicine

## 2023-11-02 ENCOUNTER — Emergency Department (HOSPITAL_COMMUNITY): Payer: HMO

## 2023-11-02 ENCOUNTER — Other Ambulatory Visit: Payer: Self-pay

## 2023-11-02 DIAGNOSIS — R55 Syncope and collapse: Secondary | ICD-10-CM | POA: Insufficient documentation

## 2023-11-02 DIAGNOSIS — K529 Noninfective gastroenteritis and colitis, unspecified: Secondary | ICD-10-CM

## 2023-11-02 DIAGNOSIS — R1030 Lower abdominal pain, unspecified: Secondary | ICD-10-CM | POA: Diagnosis present

## 2023-11-02 LAB — COMPREHENSIVE METABOLIC PANEL
ALT: 18 U/L (ref 0–44)
AST: 35 U/L (ref 15–41)
Albumin: 4.8 g/dL (ref 3.5–5.0)
Alkaline Phosphatase: 72 U/L (ref 38–126)
Anion gap: 18 — ABNORMAL HIGH (ref 5–15)
BUN: 18 mg/dL (ref 8–23)
CO2: 20 mmol/L — ABNORMAL LOW (ref 22–32)
Calcium: 9.4 mg/dL (ref 8.9–10.3)
Chloride: 98 mmol/L (ref 98–111)
Creatinine, Ser: 1.06 mg/dL — ABNORMAL HIGH (ref 0.44–1.00)
GFR, Estimated: 58 mL/min — ABNORMAL LOW (ref >60)
Glucose, Bld: 89 mg/dL (ref 70–99)
Potassium: 4.1 mmol/L (ref 3.5–5.1)
Sodium: 136 mmol/L (ref 135–145)
Total Bilirubin: 0.3 mg/dL (ref 0.0–1.2)
Total Protein: 6.9 g/dL (ref 6.5–8.1)

## 2023-11-02 LAB — URINALYSIS, ROUTINE W REFLEX MICROSCOPIC
Bilirubin Urine: NEGATIVE
Glucose, UA: NEGATIVE mg/dL
Hgb urine dipstick: NEGATIVE
Ketones, ur: 5 mg/dL — AB
Leukocytes,Ua: NEGATIVE
Nitrite: NEGATIVE
Protein, ur: NEGATIVE mg/dL
Specific Gravity, Urine: 1.024 (ref 1.005–1.030)
pH: 5 (ref 5.0–8.0)

## 2023-11-02 LAB — TROPONIN I (HIGH SENSITIVITY)
Troponin I (High Sensitivity): 7 ng/L (ref <18)
Troponin I (High Sensitivity): 8 ng/L (ref <18)

## 2023-11-02 LAB — CBC WITH DIFFERENTIAL/PLATELET
Abs Immature Granulocytes: 0.03 10*3/uL (ref 0.00–0.07)
Basophils Absolute: 0.1 10*3/uL (ref 0.0–0.1)
Basophils Relative: 1 %
Eosinophils Absolute: 0.2 10*3/uL (ref 0.0–0.5)
Eosinophils Relative: 2 %
HCT: 42.7 % (ref 36.0–46.0)
Hemoglobin: 14.4 g/dL (ref 12.0–15.0)
Immature Granulocytes: 0 %
Lymphocytes Relative: 17 %
Lymphs Abs: 1.5 10*3/uL (ref 0.7–4.0)
MCH: 33.1 pg (ref 26.0–34.0)
MCHC: 33.7 g/dL (ref 30.0–36.0)
MCV: 98.2 fL (ref 80.0–100.0)
Monocytes Absolute: 0.6 10*3/uL (ref 0.1–1.0)
Monocytes Relative: 7 %
Neutro Abs: 6.4 10*3/uL (ref 1.7–7.7)
Neutrophils Relative %: 73 %
Platelets: 303 10*3/uL (ref 150–400)
RBC: 4.35 MIL/uL (ref 3.87–5.11)
RDW: 13.3 % (ref 11.5–15.5)
WBC: 8.8 10*3/uL (ref 4.0–10.5)
nRBC: 0 % (ref 0.0–0.2)

## 2023-11-02 LAB — LIPASE, BLOOD: Lipase: 24 U/L (ref 11–51)

## 2023-11-02 LAB — LACTIC ACID, PLASMA: Lactic Acid, Venous: 0.9 mmol/L (ref 0.5–1.9)

## 2023-11-02 MED ORDER — FLUCONAZOLE 150 MG PO TABS: 150.0000 mg | ORAL_TABLET | Freq: Every day | ORAL | 0 refills | Status: AC

## 2023-11-02 MED ORDER — IOHEXOL 350 MG/ML SOLN
75.0000 mL | Freq: Once | INTRAVENOUS | Status: AC | PRN
  Administered 2023-11-02: 75 mL via INTRAVENOUS

## 2023-11-02 MED ORDER — AMOXICILLIN-POT CLAVULANATE 875-125 MG PO TABS: 1.0000 | ORAL_TABLET | Freq: Two times a day (BID) | ORAL | 0 refills | Status: DC

## 2023-11-02 MED ORDER — MORPHINE SULFATE (PF) 4 MG/ML IV SOLN
4.0000 mg | Freq: Once | INTRAVENOUS | Status: AC
Start: 2023-11-02 — End: 2023-11-02
  Administered 2023-11-02: 4 mg via INTRAVENOUS
  Filled 2023-11-02: qty 1

## 2023-11-02 MED ORDER — AMOXICILLIN-POT CLAVULANATE 875-125 MG PO TABS
1.0000 | ORAL_TABLET | Freq: Once | ORAL | Status: AC
  Administered 2023-11-02: 1 via ORAL
  Filled 2023-11-02: qty 1

## 2023-11-02 NOTE — ED Triage Notes (Signed)
 Patient presents via EMS from home. Per EMS, patient was in the bathroom on the toilet when she felt like she was going to pass out and slid down to the floor. Per EMS, patient did not fall or lose consciousness. Patient received approximately 200mL normal saline en route from EMS. Patient endorsing RLQ pain, feeling urge to urinate and have a bowel movement upon arrival.  CBG 163 90 NSR 97% RA

## 2023-11-02 NOTE — ED Provider Triage Note (Signed)
 Emergency Medicine Provider Triage Evaluation Note  [name] , a 68 y.o. female  was evaluated in triage.  Pt complains of Abdominal pain and syncope.  Patient reports that she was trying to get to the bathroom and she began sweating and feeling very lightheaded.  Passed out as she was going to sit on the toilet and kind of slid down from the toilet but did not completely fall or strike her head.  Reports that she has a lot of issues with constipation and feels pressure like she needs to use the bathroom and has had persistent pain across her lower abdomen since this morning, worse while in transit to the ED.  No vomiting.  Review of Systems  Positive: Constipation, syncope, abdominal pain Negative: Vomiting, diarrhea, chest pain, shortness of breath, palpitations  Physical Exam  BP 124/72 (BP Location: Right Arm)   Pulse 83   Temp (!) 97.4 F (36.3 C) (Oral)   Resp 18   Ht 5' 8 (1.727 m)   Wt 120.2 kg   SpO2 97%   BMI 40.29 kg/m  Gen:   Awake, no distress   Resp:  Normal effort  MSK:   Moves extremities without difficulty  Other:  Tenderness across the lower abdomen without guarding  Medical Decision Making  Medically screening exam initiated at 2:37 PM.  Appropriate orders placed.  [name] was informed that the remainder of the evaluation will be completed by another provider, this initial triage assessment does not replace that evaluation, and the importance of remaining in the ED until their evaluation is complete.  Labs, EKG and abdominal CT ordered to initiate patient's workup.   Sydney Larraine FALCON, PA-C 11/02/23 1439

## 2023-11-02 NOTE — ED Provider Notes (Signed)
 [place] [facility] Provider Note   CSN: [id number] Arrival date & time: 11/02/23  1405     History  Chief Complaint  Patient presents with   Near Syncope    [name] is a 68 y.o. female.  HPI   68 year old female presents emergency department with possible near syncopal episode.  Patient states that she has had GI issues alternating from constipation and diarrhea.  Earlier today she felt the urge to use the restroom and when she tried to get there quickly she became sweaty.  She sat down on the toilet and her husband describes that she became out of it.  The patient states she did not fully lose consciousness.  She remembers being able to hear her husband just felt like she could not talk.  She had no headache, chest pain or shortness of breath at this time.  States that she has had an episode like this before when she was straining but not to this extent.  Currently she is complaining of a mild discomfort over her lower abdomen.  She denies any fever or other acute symptoms.  Home Medications Prior to Admission medications   Medication Sig Start Date End Date Taking? Authorizing Provider  BIOTIN PO Take 1 tablet by mouth daily.   Yes [provider]  Cyanocobalamin (B-12 PO) Take 1 tablet by mouth daily.   Yes [provider]  Dihydroxyaluminum Sod Carb (ROLAIDS PO) Take 4 each by mouth daily as needed.   Yes [provider]  DULoxetine  (CYMBALTA ) 60 MG capsule Take 1 capsule (60 mg total) by mouth daily. 07/13/22  Yes Wilkinson, Dana E, NP  ferrous sulfate  325 (65 FE) MG tablet TAKE 1 TABLET BY MOUTH EVERY DAY 10/06/23  Yes Cranford, Tonya, NP  hydrochlorothiazide  (HYDRODIURIL ) 12.5 MG tablet TAKE 1 TABLET BY MOUTH EVERY DAY 05/30/23  Yes Wilkinson, Dana E, NP  lisinopril  (ZESTRIL ) 10 MG tablet Take 1 tablet daily for BP Patient taking differently: Take 10 mg by mouth daily. 01/04/23  Yes Cranford, Tonya, NP   Magnesium 400 MG TABS Take 1 tablet by mouth daily.   Yes [provider]  morphine  (MS CONTIN ) 30 MG 12 hr tablet Take 1 tablet (30 mg total) by mouth every 8 (eight) hours. 09/26/23  Yes   phentermine  (ADIPEX-P ) 37.5 MG tablet Take 1/2 to 1 tablet every Morning for Dieting & Weight Loss 08/08/23  Yes Cranford, Tonya, NP  tirzepatide  (ZEPBOUND ) 2.5 MG/0.5ML injection vial Inject 2.5 mg into the skin once a week. Patient taking differently: Inject 2.5 mg into the skin once a week. On Mondays 09/28/23  Yes Cranford, Tonya, NP  tiZANidine  (ZANAFLEX ) 2 MG tablet Take 1 tablet (2 mg total) by mouth 3 (three) times daily as needed. 09/26/23  Yes   EPINEPHrine  0.3 mg/0.3 mL IJ SOAJ injection Inject 0.3 mLs (0.3 mg total) into the muscle once. Patient not taking: Reported on 11/02/2023 05/07/15   Dasie Faden, MD  SYMPROIC 0.2 MG TABS Take 1 tablet by mouth daily as needed. Patient not taking: Reported on 11/02/2023 03/11/19   [provider]  traZODone  (DESYREL ) 50 MG tablet Take 1 tablet (50 mg total) by mouth at bedtime. Patient not taking: Reported on 11/02/2023 09/26/23         Allergies    Patient has no known allergies.    Review of Systems   Review of Systems  Constitutional:  Positive for diaphoresis. Negative for fever.  Respiratory:  Negative for shortness of breath.   Cardiovascular:  Negative for chest pain, palpitations and leg swelling.  Gastrointestinal:  Positive for abdominal pain, constipation, diarrhea and nausea. Negative for abdominal distention, blood in stool and vomiting.  Skin:  Negative for rash.  Neurological:  Negative for headaches.    Physical Exam Updated Vital Signs BP (!) 146/72   Pulse 86   Temp (!) 97.4 F (36.3 C) (Oral)   Resp 17   Ht 5' 8 (1.727 m)   Wt 120.2 kg   SpO2 99%   BMI 40.29 kg/m  Physical Exam Vitals and nursing note reviewed.  Constitutional:      General: She is not in acute distress.    Appearance: Normal appearance.   HENT:     Head: Normocephalic.     Mouth/Throat:     Mouth: Mucous membranes are moist.  Cardiovascular:     Rate and Rhythm: Normal rate.  Pulmonary:     Effort: Pulmonary effort is normal. No respiratory distress.  Abdominal:     General: Bowel sounds are normal.     Palpations: Abdomen is soft.     Tenderness: There is abdominal tenderness.     Comments: Lower abdominal tenderness to palpation  Skin:    General: Skin is warm.  Neurological:     Mental Status: She is alert and oriented to person, place, and time. Mental status is at baseline.  Psychiatric:        Mood and Affect: Mood normal.     ED Results / Procedures / Treatments   Labs (all labs ordered are listed, but only abnormal results are displayed) Labs Reviewed  COMPREHENSIVE METABOLIC PANEL - Abnormal; Notable for the following components:      Result Value   CO2 20 (*)    Creatinine, Ser 1.06 (*)    GFR, Estimated 58 (*)    Anion gap 18 (*)    All other components within normal limits  CBC WITH DIFFERENTIAL/PLATELET  LIPASE, BLOOD  URINALYSIS, ROUTINE W REFLEX MICROSCOPIC  TROPONIN I (HIGH SENSITIVITY)  TROPONIN I (HIGH SENSITIVITY)    EKG None  Radiology No results found.  Procedures Procedures    Medications Ordered in ED Medications  iohexol  (OMNIPAQUE ) 350 MG/ML injection 75 mL (has no administration in time range)    ED Course/ Medical Decision Making/ A&P                                 Medical Decision Making Amount and/or Complexity of Data Reviewed Labs: ordered.  Risk Prescription drug management.   68 year old female presents emergency department after an episode of diaphoresis/near syncope while sitting on the toilet with episodes of diarrhea.  Now here with complaints of alternating constipation/diarrhea and lower abdominal pain.  She was tachycardic in triage which resolved on my initial evaluation.  Blood work is reassuring, abdominal labs are normal.  However  secondary to the tenderness in the lower abdomen a CT of the abdomen pelvis was done.  This shows diffuse colonic edema with some mild free fluid.  Most likely infectious but in the differential is ischemic.  Lactic acid was drawn which is normal.  Consulted with general surgeon, Dr. Rubin.  He agrees with a normal white count, reassuring abdominal exam, no other concerning findings on the CT and normal lactic acid that this is less likely to be ischemic.  We will treat with antibiotics and send  outpatient with strict return to ED precautions.  Patient at this time appears safe and stable for discharge and close outpatient follow up. Discharge plan and strict return to ED precautions discussed, patient verbalizes understanding and agreement.        Final Clinical Impression(s) / ED Diagnoses Final diagnoses:  None    Rx / DC Orders ED Discharge Orders     None         Bari Roxie HERO, DO 11/02/23 2229

## 2023-11-02 NOTE — Discharge Instructions (Signed)
 You have been seen and discharged from the emergency department.  Your workup today is significant for colitis.  You have been started on antibiotics.  Continue to take your home medications as prescribed.  Stay well-hydrated.  Follow-up with your primary provider for further evaluation and further care. If you have any worsening symptoms or further concerns for your health please return to an emergency department for further evaluation.

## 2023-11-07 ENCOUNTER — Other Ambulatory Visit: Payer: Self-pay | Admitting: Nurse Practitioner

## 2023-11-07 ENCOUNTER — Telehealth: Payer: Self-pay | Admitting: Nurse Practitioner

## 2023-11-07 MED ORDER — TIRZEPATIDE-WEIGHT MANAGEMENT 2.5 MG/0.5ML ~~LOC~~ SOLN: 5.0000 mg | SUBCUTANEOUS | 2 refills | Status: DC

## 2023-11-07 NOTE — Telephone Encounter (Signed)
 Patient is requesting a refill of zepbound. However, she stated she called [facility] and they have 12.5 MG? She wants you to send in 12.5 to [facility]- [address] [place], [place] [address]

## 2023-11-08 ENCOUNTER — Other Ambulatory Visit: Payer: Self-pay

## 2023-11-08 MED ORDER — TIRZEPATIDE-WEIGHT MANAGEMENT 2.5 MG/0.5ML ~~LOC~~ SOLN: 5.0000 mg | SUBCUTANEOUS | 1 refills | Status: DC

## 2023-11-09 ENCOUNTER — Other Ambulatory Visit: Payer: Self-pay

## 2023-11-09 ENCOUNTER — Ambulatory Visit: Payer: HMO | Admitting: Occupational Therapy

## 2023-11-09 MED ORDER — TIRZEPATIDE-WEIGHT MANAGEMENT 5 MG/0.5ML ~~LOC~~ SOLN: 5.0000 mg | SUBCUTANEOUS | 0 refills | Status: DC

## 2023-11-13 ENCOUNTER — Encounter: Payer: Self-pay | Admitting: Occupational Therapy

## 2023-11-13 ENCOUNTER — Other Ambulatory Visit (HOSPITAL_COMMUNITY): Payer: Self-pay

## 2023-11-13 ENCOUNTER — Ambulatory Visit: Payer: HMO | Admitting: Occupational Therapy

## 2023-11-13 DIAGNOSIS — I890 Lymphedema, not elsewhere classified: Secondary | ICD-10-CM | POA: Diagnosis not present

## 2023-11-13 NOTE — Therapy (Signed)
OUTPATIENT OCCUPATIONAL THERAPY TREATMENT NOTE  LOWER EXTREMITY LYMPHEDEMA Patient Name: [name] MRN: [id number] DOB:1956/06/29, 68 y.o., female Today's Date: 11/13/2023  END OF SESSION:   OT End of Session - 11/13/23 1411     Visit Number 3    Number of Visits 36    Date for OT Re-Evaluation 12/13/23    OT Start Time 0211    OT Stop Time 0300    OT Time Calculation (min) 49 min    Activity Tolerance Patient tolerated treatment well;No increased pain    Behavior During Therapy WFL for tasks assessed/performed             Past Medical History:  Diagnosis Date   Allergy    Anemia    Anxiety    Arthritis    Depression    Fibromyalgia    HTN (hypertension)    MRSA (methicillin resistant Staphylococcus aureus)    Obese    Plantar fasciitis    Pneumonia    Seasonal allergies    Sleep apnea 2012   took test in [place]-told to use cpap-could not ude   Past Surgical History:  Procedure Laterality Date   ANTERIOR CERVICAL DECOMP/DISCECTOMY FUSION  05/09/2008   C5-6, C6-7; with arthrodesis also   GASTRIC BYPASS  10/24/2000   KNEE ARTHROSCOPY  10/24/2002   rt and lt knee   ROTATOR CUFF REPAIR W/ DISTAL CLAVICLE EXCISION  01/06/2006   right   SHOULDER ARTHROSCOPY Left 10/24/2001   TONSILLECTOMY     TOTAL KNEE ARTHROPLASTY  03/29/2004   left   TOTAL KNEE ARTHROPLASTY  11/07/2003   right   TOTAL KNEE REVISION  07/30/2010   right; with lateral release   UPPER GASTROINTESTINAL ENDOSCOPY     Patient Active Problem List   Diagnosis Date Noted   Essential hypertension 02/14/2022   Pneumonia 06/10/2017   CAP (community acquired pneumonia) 06/09/2017   AKI (acute kidney injury) (HCC) 06/09/2017   ADHD, 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   Insomnia    Cellulitis and abscess of leg 06/19/2016   Pyoderma 02/11/2016   Cellulitis of left lower extremity 01/09/2016   Psoriatic arthritis (HCC)    Cough    Wheezing    Chronic anemia 12/05/2015   Chronic  venous insufficiency 12/04/2015   Hoarding disorder 03/05/2015   Alcohol abuse 11/21/2014   Anxiety state 11/21/2014   Morbid obesity with BMI of 40.0-44.9, adult (HCC) 11/21/2014   Epistaxis 09/24/2014   Bruising 05/03/2014   Hematemesis 03/07/2014   Depressive disorder 12/09/2013   GAD (generalized anxiety disorder) 09/17/2013   Fibromyalgia 07/22/2013    PCP: Adela Glimpse, NP  REFERRING PROVIDER: Lucia Estelle, DPM  REFERRING DIAG: I89.0  THERAPY DIAG:  Lymphedema, not elsewhere classified  Rationale for Evaluation and Treatment: Rehabilitation  ONSET DATE: "I've had leg swelling for years. I don't know when it started. " + family hx of leg swelling-mother  SUBJECTIVE:  SUBJECTIVE STATEMENT: [name] returns to OT to commence CDT to RLE. She is accompanied by her spouse, [name]. Pt has no new complaints since initial eval  Pt reports she had an episode of dizziness and diarrhea that concerned her and she went to the ED since last session.   PERTINENT HISTORY: Pls see active problem list above  PAIN:  Are you having LE related pain? Yes: NPRS scale: 2//10 Pain location: feet Pain description: burning Aggravating factors: walking Relieving factors: elevation  PRECAUTIONS: Other: LYMPHEDEMA PRECAUTIONS  WEIGHT BEARING RESTRICTIONS: No  FALLS:  Has patient fallen in last 6 months? 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   OBJECTIVE: Note: Objective measures were completed at Evaluation unless otherwise noted.  COGNITION:  Overall cognitive status: frequent redirection needed during eval to stay on topic    POSTURE: WFL  LE ROM: grossly WFL  STRENGTH: grossly WFL   BLE COMPARATIVE LIMB VOLUMETRICS INITIAL 11/01/23  LANDMARK RIGHT  (dominant)  R LEG (A-D) 4296.6 ml  R THIGH (E-G) ml  R FULL LIMB (A-G) ml  Limb Volume differential (LVD)  %  Volume change since initial %  Volume change overall V  (Blank rows = not tested)  LANDMARK LEFT    L LEG (A-D) 4829.1 ml  L  THIGH (E-G) ml  L FULL LIMB (A-G) ml  Limb Volume differential (LVD)   12.4%, L>R  Volume change since initial %  Volume change overall %  (Blank rows = not tested)   Mild, Stage  II, 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 Obesity  Skin  Description Hyper-Keratosis Peau' de Orange Shiny Tight Fibrotic/ Indurated Fatty Doughy Spongy/ boggy   x    Bilateral Chronic Lipo dermatosclerosis Hardened, thickened, leathery      Skin dry Flaky WNL Macerated   mildly x     Color Redness Varicosities Blanching Hemosiderin Stain Mottled   x x x  Severe bilaterally x   Odor Malodorous Yeast Fungal infection  WNL      x   Temperature Warm Cool wnl    x     Pitting Edema   1+ 2+ 3+ 4+ Non-pitting         x   Girth Symmetrical Asymmetrical                   Distribution    L>R toes to popliteal     Stemmer Sign Positive Negative   +    Lymphorrhea History Of:  Present Absent     x    Wounds History Of Present Absent Venous Arterial Pressure Sheer     x        Signs of Infection Redness Warmth Erythema Acute Swelling Drainage Borders                    Sensation Light Touch Deep pressure Hypersensitivity Neuropathic pain   In Tact Impaired In  tact Impaired Absent Impaired Plantar surfaces bilaterally   x  x  x      Nails WNL   Fungus nail dystrophy     x   Hair Growth Symmetrical Asymmetrical   x    Skin Creases Base of toes  Ankles   Base of Fingers knees       Abdominal pannus Thigh Lobules  Face/neck   x           TODAY'S TREATMENT:                                                                                                                                         Pt and family edu for multilayer compression wrapping  PATIENT EDUCATION:  Continued Pt/ CG edu for lymphedema self care home program throughout session. Topics include outcome of comparative limb volumetrics- starting limb volume differentials (LVDs), technology and gradient techniques used for short stretch, multilayer compression wrapping, simple self-MLD, therapeutic lymphatic pumping exercises, skin/nail care, LE precautions,. compression garment recommendations and specifications, wear and care schedule and compression garment donning / doffing w assistive devices. Discussed progress towards all OT goals since commencing CDT. All questions answered to the Pt's satisfaction. Good return. Person educated: Patient  Education method: Explanation, Demonstration, and Handouts Education comprehension: verbalized understanding, returned demonstration, verbal cues required, and needs further education  HOME EXERCISE PROGRAM: BLE lymphatic pumping there ex using- 1 sets of 10 reps, each exercise in order-  1-2 x daily, bilaterally Simple self MLD 1 x daily Daily skin care to increase hydration, skin mobility and decrease infection risk- can be done during MLD Compression wraps 23/7 until garment fitting complete   ASSESSMENT  CLINICAL IMPRESSION:  Continues Pt and family edu for lymphedema precautions and  multilayer  compression wrapping to the L leg using 1 each 8, 10 and 12 cm wide x 5 meters long over rosidal foam and stockinett  using gradient  techniques. Pt instructed to remove wraps if she experiences acute pain in the limb, or atypical sob. Pt AGREES TO BRING WRAPS TO NEXT CLINIC VISIT, AND T bringing lace up shoes to fit over wraps to limit falls risk. By end of session Pt's spouse applied wraps using correct techniques with min A.Next session commence MLD.  Cont as per POC.  (Initial Eval 09/13/23: [name] is a 68 y.o. 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 Pt denies hx of known, chronic limb swelling in her family. She does not utilize compression garments, or a vasopneumatic compression "pump" as conservative measures. Chronic limb swelling and associated pain limits functional performance in all occupational domains, including functional ambulation and mobility, self-care and basic and instrumental ADLs. It limits participation in leisure pursuits, productive activities and social participle[ation.   Pt  will benefit from  skilled Occupational Therapy for both  Intensive and Self -management phases of Complete Decongestive Therapy (CDT) to reduce limb swelling and associated pain, to limit progression, to reduce infection risk , and to increase functional performance, social participation and quality of life. CDT include manual lymphatic drainage (MLD), skin care, therapeutic exercise and compression therapies- multilayer bandaging initially, one limb at a time,  and then appropriate compression garments, or alternatives in the final phase. Pt and caregivers who assist are trained to perform a;; aspects of Lymphedema self-care top ensure optimal self management over time. Without skilled  OT for CDT, further progression of lymphedema is certain and further functional decline is  expected.)   OBJECTIVE IMPAIRMENTS: decreased activity tolerance, decreased knowledge of condition, decreased knowledge of use of DME, increased edema, pain, and chronic leg swelling .   ACTIVITY LIMITATIONS: limited functional ambulation and functional mobility 2/2 lymphedema -related foot and leg pain; standing, squatting, stairs, bathing, toileting, dressing, and hygiene/grooming  PARTICIPATION LIMITATIONS: meal prep, cooking, cleaning, driving, shopping, community activity, and volunteering  PERSONAL FACTORS: Time since onset of injury/illness/exacerbation are also affecting patient's functional outcome.   REHAB POTENTIAL: Good  CLINICAL DECISION MAKING: Evolving/moderate complexity  EVALUATION COMPLEXITY: Moderate   GOALS: Goals reviewed with patient? Yes  SHORT TERM GOALS: Target date: 4th OT Rx visit   Pt will demonstrate understanding of lymphedema precautions and prevention strategies with modified independence using a printed reference to identify at least 5 precautions and discussing how s/he may implement them into daily life to reduce risk of progression with extra time. Baseline:Max A Goal status: INITIAL  2.  Pt will be able to apply multilayer, knee length, gradient, compression wraps to one leg at a time with max caregiver assistance to decrease limb volume, to limit infection risk, and to limit lymphedema progression.  Baseline: Dependent Goal status: 11/13/23 GOAL MET  LONG TERM GOALS: Target date: 12/12/22  Given this patient's Intake score of TBD % on the functional outcomes FOTO tool, patient will experience an increase in function of 5 points to improve basic and instrumental ADLs performance, including lymphedema self-care.  Baseline: Max A Goal status: INITIAL  2.  Given this patient's Intake score of TBD % on the Lymphedema Life Impact Scale (LLIS), patient will experience a reduction of at least 5 points in her perceived level of functional impairment  resulting from lymphedema to improve functional performance and quality of life (QOL). Baseline: 64.71% Goal status: INITIAL  3.  Pt will achieve at least a 10% volume reduction in B legs to return limb to typical size and shape, to limit infection risk and LE progression, to decrease pain, to improve function. Baseline: Dependent Goal status: INITIAL  4.  Pt will obtain proper compression garments/devices and achieve modified independence (extra time + assistive devices) with donning/doffing to optimize limb volume reductions and limit LE progression over time. Baseline:  Goal status: INITIAL PLAN:  OT FREQUENCY: 2x/week  OT DURATION: 12 weeks  PLANNED INTERVENTIONS: 97110-Therapeutic exercises, 97530- Therapeutic activity, 97535- Self Care, 81191- Manual therapy, Patient/Family education, Manual lymph drainage, Compression bandaging, DME instructions, and fitting with compression garments  Custom-made gradient compression garments and HOS devices are medically necessary because they are uniquely sized and shaped to fit the exact dimensions of the affected extremities, and to provide appropriate medical grade, graduated compression essential for optimally managing chronic, progressive lymphedema. Multiple custom compression garments are needed to ensure proper hygiene to limit infection risk. Custom compression garments should  be replaced q 3-6 months When worn consistently for optimal lipo-lymphedema self-management over time. HOS devices, medically necessary to limit fibrosis buildup in tissue, should be replaced q 2 years and PRN when worn out.     Consider replacing basic , vasopneumatic compression "pump" with an advanced pneumatic, sequential, compression device, such as the Flexitouch, made by [employer]. The Flexitouch is medically necessary to facilitate improved lymphatic drainage by following specific lymphatic anatomy proximally to distally, including inguinal, deep abdominal,  and/or axillary watersheds.   PLAN FOR NEXT SESSION:  Multilayer compression bandaging one leg only Pt / family edu for LE self care: bandaging  Loel Dubonnet, MS, OTR/L, CLT-LANA 11/13/23 3:56 PM

## 2023-11-14 ENCOUNTER — Other Ambulatory Visit (HOSPITAL_COMMUNITY): Payer: Self-pay

## 2023-11-15 ENCOUNTER — Other Ambulatory Visit (HOSPITAL_COMMUNITY): Payer: Self-pay

## 2023-11-15 ENCOUNTER — Ambulatory Visit: Payer: HMO | Admitting: Occupational Therapy

## 2023-11-15 DIAGNOSIS — I890 Lymphedema, not elsewhere classified: Secondary | ICD-10-CM

## 2023-11-15 MED ORDER — MORPHINE SULFATE ER 30 MG PO TBCR
EXTENDED_RELEASE_TABLET | Freq: Two times a day (BID) | ORAL | 0 refills | Status: DC
  Filled 2023-11-15: qty 90, 45d supply, fill #0

## 2023-11-15 NOTE — Therapy (Signed)
OUTPATIENT OCCUPATIONAL THERAPY TREATMENT NOTE  LOWER EXTREMITY LYMPHEDEMA Patient Name: [name] MRN: [id number] DOB:02/19/1956, 68 y.o., female Today's Date: 11/15/2023  END OF SESSION:   OT End of Session - 11/15/23 1309     Visit Number 4    Number of Visits 36    Date for OT Re-Evaluation 12/13/23    OT Start Time 0100    OT Stop Time 0200    OT Time Calculation (min) 60 min    Activity Tolerance Patient tolerated treatment well;No increased pain    Behavior During Therapy WFL for tasks assessed/performed             Past Medical History:  Diagnosis Date   Allergy    Anemia    Anxiety    Arthritis    Depression    Fibromyalgia    HTN (hypertension)    MRSA (methicillin resistant Staphylococcus aureus)    Obese    Plantar fasciitis    Pneumonia    Seasonal allergies    Sleep apnea 2012   took test in [place]-told to use cpap-could not ude   Past Surgical History:  Procedure Laterality Date   ANTERIOR CERVICAL DECOMP/DISCECTOMY FUSION  05/09/2008   C5-6, C6-7; with arthrodesis also   GASTRIC BYPASS  10/24/2000   KNEE ARTHROSCOPY  10/24/2002   rt and lt knee   ROTATOR CUFF REPAIR W/ DISTAL CLAVICLE EXCISION  01/06/2006   right   SHOULDER ARTHROSCOPY Left 10/24/2001   TONSILLECTOMY     TOTAL KNEE ARTHROPLASTY  03/29/2004   left   TOTAL KNEE ARTHROPLASTY  11/07/2003   right   TOTAL KNEE REVISION  07/30/2010   right; with lateral release   UPPER GASTROINTESTINAL ENDOSCOPY     Patient Active Problem List   Diagnosis Date Noted   Essential hypertension 02/14/2022   Pneumonia 06/10/2017   CAP (community acquired pneumonia) 06/09/2017   AKI (acute kidney injury) (HCC) 06/09/2017   ADHD, 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   Insomnia    Cellulitis and abscess of leg 06/19/2016   Pyoderma 02/11/2016   Cellulitis of left lower extremity 01/09/2016   Psoriatic arthritis (HCC)    Cough    Wheezing    Chronic anemia 12/05/2015   Chronic  venous insufficiency 12/04/2015   Hoarding disorder 03/05/2015   Alcohol abuse 11/21/2014   Anxiety state 11/21/2014   Morbid obesity with BMI of 40.0-44.9, adult (HCC) 11/21/2014   Epistaxis 09/24/2014   Bruising 05/03/2014   Hematemesis 03/07/2014   Depressive disorder 12/09/2013   GAD (generalized anxiety disorder) 09/17/2013   Fibromyalgia 07/22/2013    PCP: Adela Glimpse, NP  REFERRING PROVIDER: Lucia Estelle, DPM  REFERRING DIAG: I89.0  THERAPY DIAG:  Lymphedema, not elsewhere classified  Rationale for Evaluation and Treatment: Rehabilitation  ONSET DATE: "I've had leg swelling for years. I don't know when it started. " + family hx of leg swelling-mother  SUBJECTIVE:  SUBJECTIVE STATEMENT: KEITHA REICHWEIN returns to OT to commence CDT to RLE. She is accompanied by her spouse, [name]. Pt has no new complaints since initial eval  Pt denies LE-related pain. She arrives with LLE compression wraps in place. She tells me that she tolerated them well without any difficulty. Spouse applied wraps daily between visits, and has some questions       today about wrapping.   PERTINENT HISTORY: Pls see active problem list above  PAIN:  Are you having LE related pain? Yes: NPRS scale: 2//10 Pain location: feet Pain description: burning Aggravating factors: walking Relieving factors: elevation  PRECAUTIONS: Other: LYMPHEDEMA PRECAUTIONS  WEIGHT BEARING RESTRICTIONS: No  FALLS:  Has patient fallen in last 6 months? 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   OBJECTIVE: Note: Objective measures were completed at Evaluation unless otherwise noted.  COGNITION:  Overall cognitive status: frequent redirection needed during eval to stay on topic    POSTURE: WFL  LE ROM: grossly WFL  STRENGTH: grossly WFL   BLE COMPARATIVE LIMB VOLUMETRICS INITIAL 11/01/23  LANDMARK RIGHT  (dominant)  R LEG (A-D) 4296.6 ml  R THIGH (E-G) ml  R FULL LIMB (A-G) ml  Limb Volume differential (LVD)  %  Volume change since initial %  Volume change overall V  (Blank rows = not tested)  LANDMARK LEFT    L LEG (A-D) 4829.1 ml  L  THIGH (E-G) ml  L FULL LIMB (A-G) ml  Limb Volume differential (LVD)   12.4%, L>R  Volume change since initial %  Volume change overall %  (Blank rows = not tested)   Mild, Stage  II, 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 Obesity  Skin  Description Hyper-Keratosis Peau' de Orange Shiny Tight Fibrotic/ Indurated Fatty Doughy Spongy/ boggy   x    Bilateral Chronic Lipo dermatosclerosis Hardened, thickened, leathery      Skin dry Flaky WNL Macerated   mildly x     Color Redness Varicosities Blanching Hemosiderin Stain Mottled   x x x  Severe bilaterally x   Odor Malodorous Yeast Fungal infection  WNL      x   Temperature Warm Cool wnl    x     Pitting Edema   1+ 2+ 3+ 4+ Non-pitting         x   Girth Symmetrical Asymmetrical                   Distribution    L>R toes to popliteal     Stemmer Sign Positive Negative   +    Lymphorrhea History Of:  Present Absent     x    Wounds History Of Present Absent Venous Arterial Pressure Sheer     x        Signs of Infection Redness Warmth Erythema Acute Swelling Drainage  [place]                    Sensation Light Touch Deep pressure Hypersensitivity  Neuropathic pain   In Tact Impaired In tact Impaired Absent Impaired Plantar surfaces bilaterally   x  x  x      Nails WNL   Fungus nail dystrophy     x   Hair Growth Symmetrical Asymmetrical   x    Skin Creases Base of toes  Ankles   Base of Fingers knees       Abdominal pannus Thigh Lobules  Face/neck   x           TODAY'S TREATMENT:                                                                                                                                         Pt and family edu for multilayer compression wrapping  PATIENT EDUCATION:  Continued Pt/ CG edu for lymphedema self care home program throughout session. Topics include outcome of comparative limb volumetrics- starting limb volume differentials (LVDs), technology and gradient techniques used for short stretch, multilayer compression wrapping, simple self-MLD, therapeutic lymphatic pumping exercises, skin/nail care, LE precautions,. compression garment recommendations and specifications, wear and care schedule and compression garment donning / doffing w assistive devices. Discussed progress towards all OT goals since commencing CDT. All questions answered to the Pt's satisfaction. Good return. Person educated: Patient  Education method: Explanation, Demonstration, and Handouts Education comprehension: verbalized understanding, returned demonstration, verbal cues required, and needs further education  HOME EXERCISE PROGRAM: BLE lymphatic pumping there ex using- 1 sets of 10 reps, each exercise in order-  1-2 x daily, bilaterally Simple self MLD 1 x daily Daily skin care to increase hydration, skin mobility and decrease infection risk- can be done during MLD Compression wraps 23/7 until garment fitting complete   ASSESSMENT  CLINICAL IMPRESSION: SpoUSE AND PATIENT DID AN EXCELLENT JOB COLLABORATING ON KNEE LENGTH COMPRESSION  BANDAGING BETWEEN VISITS. L leg congestion is significantly decreased today. Skin color, temperature and subcutaneous fibrosis and hardening of the skin are unchanged since last visit and after manual lymphatic drainage (MLD) with simultaneous skin care today. Skin remains somewhat rigid and difficult to move when stretching w J strokes Continued Pt edu for compression wrapping while reapplying bandages today. Excellent return. Spouse is a very quick study. Cont as per POC.  (Initial Eval 09/13/23: Shakenya Clausing is a 68 y.o. 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 Pt denies hx of known, chronic limb swelling in her family. She does not utilize compression garments, or a vasopneumatic compression "pump" as conservative measures. Chronic limb swelling and associated pain limits functional performance in all occupational domains, including functional ambulation and mobility, self-care and basic and instrumental ADLs. It limits participation in leisure pursuits, productive activities and social participle[ation.  Pt will benefit from  skilled Occupational Therapy for both  Intensive and Self -management phases of Complete Decongestive Therapy (CDT) to reduce limb swelling and associated pain, to limit progression, to reduce infection risk , and to increase functional performance, social participation and quality of life. CDT include manual lymphatic drainage (MLD), skin care, therapeutic exercise and compression therapies- multilayer bandaging initially, one limb at a time,  and then appropriate compression garments, or alternatives in the final phase. Pt and caregivers who assist are trained to perform a;; aspects of Lymphedema self-care top ensure optimal self  management over time. Without skilled  OT for CDT, further progression of lymphedema is certain and further functional decline is expected.)   OBJECTIVE IMPAIRMENTS: decreased activity tolerance, decreased knowledge of condition, decreased knowledge of use of DME, increased edema, pain, and chronic leg swelling .   ACTIVITY LIMITATIONS: limited functional ambulation and functional mobility 2/2 lymphedema -related foot and leg pain; standing, squatting, stairs, bathing, toileting, dressing, and hygiene/grooming  PARTICIPATION LIMITATIONS: meal prep, cooking, cleaning, driving, shopping, community activity, and volunteering  PERSONAL FACTORS: Time since onset of injury/illness/exacerbation are also affecting patient's functional outcome.   REHAB POTENTIAL: Good  CLINICAL DECISION MAKING: Evolving/moderate complexity  EVALUATION COMPLEXITY: Moderate   GOALS: Goals reviewed with patient? Yes  SHORT TERM GOALS: Target date: 4th OT Rx visit   Pt will demonstrate understanding of lymphedema precautions and prevention strategies with modified independence using a printed reference to identify at least 5 precautions and discussing how s/he may implement them into daily life to reduce risk of progression with extra time. Baseline:Max A Goal status: INITIAL  2.  Pt will be able to apply multilayer, knee length, gradient, compression wraps to one leg at a time with max caregiver assistance to decrease limb volume, to limit infection risk, and to limit lymphedema progression.  Baseline: Dependent Goal status: 11/13/23 GOAL MET  LONG TERM GOALS: Target date: 12/12/22  Given this patient's Intake score of TBD % on the functional outcomes FOTO tool, patient will experience an increase in function of 5 points to improve basic and instrumental ADLs performance, including lymphedema self-care.  Baseline: Max A Goal status: INITIAL  2.  Given this patient's Intake score of TBD % on the Lymphedema Life  Impact Scale (LLIS), patient will experience a reduction of at least 5 points in her perceived level of functional impairment resulting from lymphedema to improve functional performance and quality of life (QOL). Baseline: 64.71% Goal status: INITIAL  3.  Pt will achieve at least a 10% volume reduction in B legs to return limb to typical size and shape, to limit infection risk and LE progression, to decrease pain, to improve function. Baseline: Dependent Goal status: INITIAL  4.  Pt will obtain proper compression garments/devices and achieve modified independence (extra time + assistive devices) with donning/doffing to optimize limb volume reductions and limit LE progression over time. Baseline:  Goal status: INITIAL PLAN:  OT FREQUENCY: 2x/week  OT DURATION: 12 weeks  PLANNED INTERVENTIONS: 97110-Therapeutic exercises, 97530- Therapeutic activity, 97535- Self Care, 40981- Manual therapy, Patient/Family education, Manual lymph drainage, Compression bandaging, DME instructions, and fitting with compression garments  Custom-made gradient compression garments and HOS devices are medically necessary because they are uniquely sized and shaped to fit the exact dimensions of the affected extremities, and to provide appropriate medical grade, graduated compression essential for optimally managing chronic, progressive lymphedema. Multiple custom compression garments are needed to ensure proper hygiene to limit infection risk. Custom compression garments  should be replaced q 3-6 months When worn consistently for optimal lipo-lymphedema self-management over time. HOS devices, medically necessary to limit fibrosis buildup in tissue, should be replaced q 2 years and PRN when worn out.     Consider replacing basic , vasopneumatic compression "pump" with an advanced pneumatic, sequential, compression device, such as the Flexitouch, made by [employer]. The Flexitouch is medically necessary to facilitate  improved lymphatic drainage by following specific lymphatic anatomy proximally to distally, including inguinal, deep abdominal, and/or axillary watersheds.   PLAN FOR NEXT SESSION:  LLE MLD and simultaneous skin care using low ph castor oil  (short neck sequence bilaterally, diaphragmatic breathing, functional inguinal LN , and proximal to distal J strokes     to thigh, knee, leg and foot. Retrograde J strokes x 3 to return to terminus.) Multilayer compression bandaging to L leg using 1 each 8, 10 and 12 cm wide x 5 meters long short stretch wrap over single layer of rosidal foam and cotton stockinett using gradient techniques. Pt instructed to remove wraps and report symptoms to her doctor if she experiences acute pain in the limb, signs/symptoms of infection, or atypical sob.  Pt / family edu for LE self care: bandaging  Loel Dubonnet, MS, OTR/L, CLT-LANA 11/15/23 3:03 PM

## 2023-11-17 ENCOUNTER — Other Ambulatory Visit (HOSPITAL_COMMUNITY): Payer: Self-pay

## 2023-11-20 ENCOUNTER — Ambulatory Visit: Payer: HMO | Admitting: Occupational Therapy

## 2023-11-20 DIAGNOSIS — I890 Lymphedema, not elsewhere classified: Secondary | ICD-10-CM

## 2023-11-20 NOTE — Therapy (Unsigned)
OUTPATIENT OCCUPATIONAL THERAPY TREATMENT NOTE  LOWER EXTREMITY LYMPHEDEMA Patient Name: [name] MRN: [id number] DOB:1956/08/16, 68 y.o., female Today's Date: 11/21/2023  END OF SESSION:   OT End of Session - 11/20/23 1413     Visit Number 5    Number of Visits 36    Date for OT Re-Evaluation 12/13/23    OT Start Time 0200    OT Stop Time 0300    OT Time Calculation (min) 60 min    Activity Tolerance Patient tolerated treatment well;No increased pain    Behavior During Therapy WFL for tasks assessed/performed             Past Medical History:  Diagnosis Date   Allergy    Anemia    Anxiety    Arthritis    Depression    Fibromyalgia    HTN (hypertension)    MRSA (methicillin resistant Staphylococcus aureus)    Obese    Plantar fasciitis    Pneumonia    Seasonal allergies    Sleep apnea 2012   took test in [place]-told to use cpap-could not ude   Past Surgical History:  Procedure Laterality Date   ANTERIOR CERVICAL DECOMP/DISCECTOMY FUSION  05/09/2008   C5-6, C6-7; with arthrodesis also   GASTRIC BYPASS  10/24/2000   KNEE ARTHROSCOPY  10/24/2002   rt and lt knee   ROTATOR CUFF REPAIR W/ DISTAL CLAVICLE EXCISION  01/06/2006   right   SHOULDER ARTHROSCOPY Left 10/24/2001   TONSILLECTOMY     TOTAL KNEE ARTHROPLASTY  03/29/2004   left   TOTAL KNEE ARTHROPLASTY  11/07/2003   right   TOTAL KNEE REVISION  07/30/2010   right; with lateral release   UPPER GASTROINTESTINAL ENDOSCOPY     Patient Active Problem List   Diagnosis Date Noted   Essential hypertension 02/14/2022   Pneumonia 06/10/2017   CAP (community acquired pneumonia) 06/09/2017   AKI (acute kidney injury) (HCC) 06/09/2017   ADHD, 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   Insomnia    Cellulitis and abscess of leg 06/19/2016   Pyoderma 02/11/2016   Cellulitis of left lower extremity 01/09/2016   Psoriatic arthritis (HCC)    Cough    Wheezing    Chronic anemia 12/05/2015   Chronic  venous insufficiency 12/04/2015   Hoarding disorder 03/05/2015   Alcohol abuse 11/21/2014   Anxiety state 11/21/2014   Morbid obesity with BMI of 40.0-44.9, adult (HCC) 11/21/2014   Epistaxis 09/24/2014   Bruising 05/03/2014   Hematemesis 03/07/2014   Depressive disorder 12/09/2013   GAD (generalized anxiety disorder) 09/17/2013   Fibromyalgia 07/22/2013    PCP: Sydney Glimpse, NP  REFERRING PROVIDER: Lucia Berry, DPM  REFERRING DIAG: I89.0  THERAPY DIAG:  Lymphedema, not elsewhere classified  Rationale for Evaluation and Treatment: Rehabilitation  ONSET DATE: "I've had leg swelling for years. I don't know when it started. " + family hx of leg swelling-mother  SUBJECTIVE:  SUBJECTIVE STATEMENT: [name] returns to OT to commence CDT to RLE. She is accompanied by her spouse, [name]. Pt has no new complaints since initial eval  Pt denies LE-related pain. She arrives with LLE compression wraps in place. She tells me that she tolerated them well without any difficulty. Spouse applied wraps daily between visits.  PERTINENT HISTORY: Pls see active problem list above  PAIN:  Are you having LE related pain? No: NPRS scale: 0/10 Pain location: feet Pain description: burning Aggravating factors: walking Relieving factors: elevation  PRECAUTIONS: Other: LYMPHEDEMA PRECAUTIONS  WEIGHT BEARING RESTRICTIONS: No  FALLS:  Has patient fallen in last 6 months? 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   OBJECTIVE: Note: Objective measures were completed at Evaluation unless otherwise noted.  COGNITION:  Overall cognitive status: frequent redirection needed during eval to stay on topic    POSTURE: WFL  LE ROM: grossly WFL  STRENGTH: grossly WFL   BLE COMPARATIVE LIMB VOLUMETRICS INITIAL 11/01/23  LANDMARK RIGHT  (dominant)  R LEG (A-D) 4296.6 ml  R THIGH (E-G) ml  R FULL LIMB (A-G) ml  Limb Volume differential (LVD)  %  Volume change since initial %  Volume change overall V  (Blank rows = not tested)  LANDMARK LEFT    L LEG (A-D) 4829.1 ml  L  THIGH (E-G) ml  L FULL LIMB (A-G) ml  Limb Volume differential (LVD)   12.4%, L>R  Volume change since initial %  Volume change overall %  (Blank rows = not tested)   Mild, Stage  II, 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 Obesity  Skin  Description Hyper-Keratosis Peau' de Orange Shiny Tight Fibrotic/ Indurated Fatty Doughy Spongy/ boggy   x    Bilateral Chronic Lipo dermatosclerosis Hardened, thickened, leathery      Skin dry Flaky WNL Macerated   mildly x     Color Redness Varicosities Blanching Hemosiderin Stain Mottled   x x x  Severe bilaterally x   Odor Malodorous Yeast Fungal infection  WNL      x   Temperature Warm Cool wnl    x     Pitting Edema   1+ 2+ 3+ 4+ Non-pitting         x   Girth Symmetrical Asymmetrical                   Distribution    L>R toes to popliteal     Stemmer Sign Positive Negative   +    Lymphorrhea History Of:  Present Absent     x    Wounds History Of Present Absent Venous Arterial Pressure Sheer     x        Signs of Infection Redness Warmth Erythema Acute Swelling Drainage Borders                    Sensation Light Touch Deep  pressure Hypersensitivity Neuropathic pain   In Tact Impaired In tact Impaired Absent Impaired Plantar surfaces  bilaterally   x  x  x      Nails WNL   Fungus nail dystrophy     x   Hair Growth Symmetrical Asymmetrical   x    Skin Creases Base of toes  Ankles   Base of Fingers knees       Abdominal pannus Thigh Lobules  Face/neck   x           TODAY'S TREATMENT:                                                                                                                                         Commenced MLD to LLE/LLQ using functional inguinal LN Compression wraps to LLE as established.  PATIENT EDUCATION:  Continued Pt/ CG edu for lymphedema self care home program throughout session. Topics include outcome of comparative limb volumetrics- starting limb volume differentials (LVDs), technology and gradient techniques used for short stretch, multilayer compression wrapping, simple self-MLD, therapeutic lymphatic pumping exercises, skin/nail care, LE precautions,. compression garment recommendations and specifications, wear and care schedule and compression garment donning / doffing w assistive devices. Discussed progress towards all OT goals since commencing CDT. All questions answered to the Pt's satisfaction. Good return. Person educated: Patient  Education method: Explanation, Demonstration, and Handouts Education comprehension: verbalized understanding, returned demonstration, verbal cues required, and needs further education  HOME EXERCISE PROGRAM: BLE lymphatic pumping there ex using- 1 sets of 10 reps, each exercise in order-  1-2 x daily, bilaterally Simple self MLD 1 x daily Daily skin care to increase hydration, skin mobility and decrease infection risk- can be done during MLD Compression wraps 23/7 until garment fitting complete   ASSESSMENT  CLINICAL IMPRESSION: Spouse AND PATIENT DID AN EXCELLENT JOB COLLABORATING ON KNEE LENGTH COMPRESSION BANDAGING BETWEEN  VISITS. L leg congestion is significantly decreased today. Skin color, temperature and subcutaneous fibrosis and hardening of the skin are unchanged since last visit and after manual lymphatic drainage (MLD) with simultaneous skin care today. Skin below the knee remains indurated with minimal excursion during MLD in keeping with presentation of lipo dermatosclerosis. Pt tolerated manual therapy and reapplication of compression wraps without increased pain. Cont as per POC.  (Initial Eval 09/13/23: Alyannah Sanks is a 68 y.o. 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 Pt denies hx of known, chronic limb swelling in her family. She does not utilize compression garments, or a vasopneumatic compression "pump" as conservative measures. Chronic limb swelling and associated pain limits functional performance in all occupational domains, including functional ambulation and mobility, self-care and basic and instrumental ADLs. It limits participation in leisure pursuits, productive activities and social participle[ation.   Pt will benefit from  skilled Occupational  Therapy for both  Intensive and Self -management phases of Complete Decongestive Therapy (CDT) to reduce limb swelling and associated pain, to limit progression, to reduce infection risk , and to increase functional performance, social participation and quality of life. CDT include manual lymphatic drainage (MLD), skin care, therapeutic exercise and compression therapies- multilayer bandaging initially, one limb at a time,  and then appropriate compression garments, or alternatives in the final phase. Pt and caregivers who assist are trained to perform a;; aspects of Lymphedema self-care top ensure optimal self  management over time. Without skilled  OT for CDT, further progression of lymphedema is certain and further functional decline is expected.)   OBJECTIVE IMPAIRMENTS: decreased activity tolerance, decreased knowledge of condition, decreased knowledge of use of DME, increased edema, pain, and chronic leg swelling .   ACTIVITY LIMITATIONS: limited functional ambulation and functional mobility 2/2 lymphedema -related foot and leg pain; standing, squatting, stairs, bathing, toileting, dressing, and hygiene/grooming  PARTICIPATION LIMITATIONS: meal prep, cooking, cleaning, driving, shopping, community activity, and volunteering  PERSONAL FACTORS: Time since onset of injury/illness/exacerbation are also affecting patient's functional outcome.   REHAB POTENTIAL: Good  CLINICAL DECISION MAKING: Evolving/moderate complexity  EVALUATION COMPLEXITY: Moderate   GOALS: Goals reviewed with patient? Yes  SHORT TERM GOALS: Target date: 4th OT Rx visit   Pt will demonstrate understanding of lymphedema precautions and prevention strategies with modified independence using a printed reference to identify at least 5 precautions and discussing how s/he may implement them into daily life to reduce risk of progression with extra time. Baseline:Max A Goal status: 11/20/23 GOAL MET  2.  Pt will be able to apply multilayer, knee length, gradient, compression wraps to one leg at a time with max caregiver assistance to decrease limb volume, to limit infection risk, and to limit lymphedema progression.  Baseline: Dependent Goal status: 11/13/23 GOAL MET  LONG TERM GOALS: Target date: 12/12/22  Given this patient's Intake score of TBD % on the functional outcomes FOTO tool, patient will experience an increase in function of 5 points to improve basic and instrumental ADLs performance, including lymphedema self-care.  Baseline: Max A Goal status: GOAL DEFERRED. FOTO tool discontinued.  2.  Given this patient's  Intake score of 64.71 on the Lymphedema Life Impact Scale (LLIS), patient will experience a reduction of at least 5 points in her perceived level of functional impairment resulting from lymphedema to improve functional performance and quality of life (QOL). Baseline: 64.71% Goal status: PROGRESSING  3.  Pt will achieve at least a 10% volume reduction in B legs to return limb to typical size and shape, to limit infection risk and LE progression, to decrease pain, to improve function. Baseline: Dependent Goal status: PROGRESSING  4.  Pt will obtain proper compression garments/devices and achieve modified independence (extra time + assistive devices) with donning/doffing to optimize limb volume reductions and limit LE progression over time. Baseline:  Goal status: ONGOING PLAN:  OT FREQUENCY: 2x/week  OT DURATION: 12 weeks  PLANNED INTERVENTIONS: 97110-Therapeutic exercises, 97530- Therapeutic activity, 97535- Self Care, 16109- Manual therapy, Patient/Family education, Manual lymph drainage, Compression bandaging, DME instructions, and fitting with compression garments  Custom-made gradient compression garments and HOS devices are medically necessary because they are uniquely sized and shaped to fit the exact dimensions of the affected extremities, and to provide appropriate medical grade, graduated compression essential for optimally managing chronic, progressive lymphedema. Multiple custom compression garments are needed to ensure proper hygiene to limit infection risk. Custom compression garments should be  replaced q 3-6 months When worn consistently for optimal lipo-lymphedema self-management over time. HOS devices, medically necessary to limit fibrosis buildup in tissue, should be replaced q 2 years and PRN when worn out.     Consider replacing basic , vasopneumatic compression "pump" with an advanced pneumatic, sequential, compression device, such as the Flexitouch, made by [employer].  The Flexitouch is medically necessary to facilitate improved lymphatic drainage by following specific lymphatic anatomy proximally to distally, including inguinal, deep abdominal, and/or axillary watersheds.   PLAN FOR NEXT SESSION:  LLE MLD and simultaneous skin care using low ph castor oil  (short neck sequence bilaterally, diaphragmatic breathing, functional inguinal LN , and proximal to distal J strokes     to thigh, knee, leg and foot. Retrograde J strokes x 3 to return to terminus.) Multilayer compression bandaging to L leg using 1 each 8, 10 and 12 cm wide x 5 meters long short stretch wrap over single layer of rosidal foam and cotton stockinett using gradient techniques. Pt instructed to remove wraps and report symptoms to her doctor if she experiences acute pain in the limb, signs/symptoms of infection, or atypical sob.  Pt / family edu for LE self care: bandaging  Loel Dubonnet, MS, OTR/L, CLT-LANA 11/21/23 8:56 AM

## 2023-11-22 ENCOUNTER — Ambulatory Visit: Payer: HMO | Admitting: Occupational Therapy

## 2023-11-22 DIAGNOSIS — I890 Lymphedema, not elsewhere classified: Secondary | ICD-10-CM | POA: Diagnosis not present

## 2023-11-23 ENCOUNTER — Encounter: Payer: HMO | Admitting: Nurse Practitioner

## 2023-11-23 NOTE — Therapy (Signed)
OUTPATIENT OCCUPATIONAL THERAPY TREATMENT NOTE  LOWER EXTREMITY LYMPHEDEMA Patient Name: [name] MRN: [id number] DOB:1956-04-01, 68 y.o., female Today's Date: 11/23/2023  END OF SESSION:   OT End of Session - 11/22/23 0952     Visit Number 6    Number of Visits 36    Date for OT Re-Evaluation 12/13/23    OT Start Time 0208    OT Stop Time 0305    OT Time Calculation (min) 57 min    Activity Tolerance Patient tolerated treatment well;No increased pain    Behavior During Therapy WFL for tasks assessed/performed             Past Medical History:  Diagnosis Date   Allergy    Anemia    Anxiety    Arthritis    Depression    Fibromyalgia    HTN (hypertension)    MRSA (methicillin resistant Staphylococcus aureus)    Obese    Plantar fasciitis    Pneumonia    Seasonal allergies    Sleep apnea 2012   took test in [place]-told to use cpap-could not ude   Past Surgical History:  Procedure Laterality Date   ANTERIOR CERVICAL DECOMP/DISCECTOMY FUSION  05/09/2008   C5-6, C6-7; with arthrodesis also   GASTRIC BYPASS  10/24/2000   KNEE ARTHROSCOPY  10/24/2002   rt and lt knee   ROTATOR CUFF REPAIR W/ DISTAL CLAVICLE EXCISION  01/06/2006   right   SHOULDER ARTHROSCOPY Left 10/24/2001   TONSILLECTOMY     TOTAL KNEE ARTHROPLASTY  03/29/2004   left   TOTAL KNEE ARTHROPLASTY  11/07/2003   right   TOTAL KNEE REVISION  07/30/2010   right; with lateral release   UPPER GASTROINTESTINAL ENDOSCOPY     Patient Active Problem List   Diagnosis Date Noted   Essential hypertension 02/14/2022   Pneumonia 06/10/2017   CAP (community acquired pneumonia) 06/09/2017   AKI (acute kidney injury) (HCC) 06/09/2017   ADHD, 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   Insomnia    Cellulitis and abscess of leg 06/19/2016   Pyoderma 02/11/2016   Cellulitis of left lower extremity 01/09/2016   Psoriatic arthritis (HCC)    Cough    Wheezing    Chronic anemia 12/05/2015   Chronic  venous insufficiency 12/04/2015   Hoarding disorder 03/05/2015   Alcohol abuse 11/21/2014   Anxiety state 11/21/2014   Morbid obesity with BMI of 40.0-44.9, adult (HCC) 11/21/2014   Epistaxis 09/24/2014   Bruising 05/03/2014   Hematemesis 03/07/2014   Depressive disorder 12/09/2013   GAD (generalized anxiety disorder) 09/17/2013   Fibromyalgia 07/22/2013    PCP: Adela Glimpse, NP  REFERRING PROVIDER: Lucia Estelle, DPM  REFERRING DIAG: I89.0  THERAPY DIAG:  Lymphedema, not elsewhere classified  Rationale for Evaluation and Treatment: Rehabilitation  ONSET DATE: "I've had leg swelling for years. I don't know when it started. " + family hx of leg swelling-mother  SUBJECTIVE:  SUBJECTIVE STATEMENT: EDYTH GLOMB returns to OT to commence CDT to RLE. She is accompanied by her spouse, [name]. Pt has no new complaints.  Pt denies LE-related pain. She arrives without LLE compression wraps in place. She tells me that she tolerated them well without any difficulty. Spouse applied wraps daily between visits.  PERTINENT HISTORY: Pls see active problem list above  PAIN:  Are you having LE related pain? No: NPRS scale: 0/10 Pain location: feet Pain description: burning Aggravating factors: walking Relieving factors: elevation  PRECAUTIONS: Other: LYMPHEDEMA PRECAUTIONS  WEIGHT BEARING RESTRICTIONS: No  FALLS:  Has patient fallen in last 6 months? 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   OBJECTIVE: Note: Objective measures were completed at Evaluation unless otherwise noted.  COGNITION:  Overall cognitive status: frequent redirection needed during eval to stay on topic    POSTURE: WFL  LE ROM: grossly WFL  STRENGTH: grossly WFL   BLE COMPARATIVE LIMB VOLUMETRICS INITIAL 11/01/23  LANDMARK RIGHT  (dominant)  R LEG (A-D) 4296.6 ml  R THIGH (E-G) ml  R FULL LIMB (A-G) ml  Limb Volume differential (LVD)  %  Volume change since initial %  Volume change overall V  (Blank rows = not tested)  LANDMARK LEFT    L LEG (A-D) 4829.1 ml  L  THIGH (E-G) ml  L FULL LIMB (A-G) ml  Limb Volume differential (LVD)   12.4%, L>R  Volume change since initial %  Volume change overall %  (Blank rows = not tested)   Mild, Stage  II, 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 Obesity  Skin  Description Hyper-Keratosis Peau' de Orange Shiny Tight Fibrotic/ Indurated Fatty Doughy Spongy/ boggy   x    Bilateral Chronic Lipo dermatosclerosis Hardened, thickened, leathery      Skin dry Flaky WNL Macerated   mildly x     Color Redness Varicosities Blanching Hemosiderin Stain Mottled   x x x  Severe bilaterally x   Odor Malodorous Yeast Fungal infection  WNL      x   Temperature Warm Cool wnl    x     Pitting Edema   1+ 2+ 3+ 4+ Non-pitting         x   Girth Symmetrical Asymmetrical                   Distribution    L>R toes to popliteal     Stemmer Sign Positive Negative   +    Lymphorrhea History Of:  Present Absent     x    Wounds History Of Present Absent Venous Arterial Pressure Sheer     x        Signs of Infection Redness Warmth Erythema Acute Swelling Drainage Borders                    Sensation Light Touch Deep  pressure Hypersensitivity Neuropathic pain   In Tact Impaired In tact Impaired Absent Impaired Plantar surfaces bilaterally  x  x  x      Nails WNL   Fungus nail dystrophy     x   Hair Growth Symmetrical Asymmetrical   x    Skin Creases Base of toes  Ankles   Base of Fingers knees       Abdominal pannus Thigh Lobules  Face/neck   x           TODAY'S TREATMENT:                                                                                                                                         Commenced MLD to LLE/LLQ using functional inguinal LN Compression wraps to LLE as established.  PATIENT EDUCATION:  Continued Pt/ CG edu for lymphedema self care home program throughout session. Topics include outcome of comparative limb volumetrics- starting limb volume differentials (LVDs), technology and gradient techniques used for short stretch, multilayer compression wrapping, simple self-MLD, therapeutic lymphatic pumping exercises, skin/nail care, LE precautions,. compression garment recommendations and specifications, wear and care schedule and compression garment donning / doffing w assistive devices. Discussed progress towards all OT goals since commencing CDT. All questions answered to the Pt's satisfaction. Good return. Person educated: Patient  Education method: Explanation, Demonstration, and Handouts Education comprehension: verbalized understanding, returned demonstration, verbal cues required, and needs further education  HOME EXERCISE PROGRAM: BLE lymphatic pumping there ex using- 1 sets of 10 reps, each exercise in order-  1-2 x daily, bilaterally Simple self MLD 1 x daily Daily skin care to increase hydration, skin mobility and decrease infection risk- can be done during MLD Compression wraps 23/7 until garment fitting complete   ASSESSMENT  CLINICAL IMPRESSION: Spouse AND PATIENT continue to do an excellent job applying compression wraps between visit. From C-D  landmarks calf volume has obviously reduced in fluid volume. . L leg congestion is significantly decreased today. Skin color, temperature and subcutaneous fibrosis and hardening of the skin are unchanged since last visit and after manual lymphatic drainage (MLD) with simultaneous skin care today. Skin below the knee remains indurated with minimal excursion during MLD in keeping with presentation of lipo dermatosclerosis. Pt tolerated manual therapy and reapplication of compression wraps without increased pain. Cont as per POC.  (Initial Eval 09/13/23: Takiera Mayo is a 68 y.o. 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 Pt denies hx of known, chronic limb swelling in her family. She does not utilize compression garments, or a vasopneumatic compression "pump" as conservative measures. Chronic limb swelling and associated pain limits functional performance in all occupational domains, including functional ambulation and mobility, self-care and basic and instrumental ADLs. It limits participation in leisure pursuits, productive activities and social participle[ation.  Pt will benefit from  skilled Occupational Therapy for both  Intensive and Self -management phases of Complete Decongestive Therapy (CDT) to reduce limb swelling and associated pain, to limit progression, to reduce infection risk , and to increase functional performance, social participation and quality of life. CDT include manual lymphatic drainage (MLD), skin care, therapeutic exercise and compression therapies- multilayer bandaging initially, one limb at a time,  and then appropriate compression garments, or alternatives in the final phase. Pt and caregivers who assist are trained to perform a;;  aspects of Lymphedema self-care top ensure optimal self management over time. Without skilled  OT for CDT, further progression of lymphedema is certain and further functional decline is expected.)   OBJECTIVE IMPAIRMENTS: decreased activity tolerance, decreased knowledge of condition, decreased knowledge of use of DME, increased edema, pain, and chronic leg swelling .   ACTIVITY LIMITATIONS: limited functional ambulation and functional mobility 2/2 lymphedema -related foot and leg pain; standing, squatting, stairs, bathing, toileting, dressing, and hygiene/grooming  PARTICIPATION LIMITATIONS: meal prep, cooking, cleaning, driving, shopping, community activity, and volunteering  PERSONAL FACTORS: Time since onset of injury/illness/exacerbation are also affecting patient's functional outcome.   REHAB POTENTIAL: Good  CLINICAL DECISION MAKING: Evolving/moderate complexity  EVALUATION COMPLEXITY: Moderate   GOALS: Goals reviewed with patient? Yes  SHORT TERM GOALS: Target date: 4th OT Rx visit   Pt will demonstrate understanding of lymphedema precautions and prevention strategies with modified independence using a printed reference to identify at least 5 precautions and discussing how s/he may implement them into daily life to reduce risk of progression with extra time. Baseline:Max A Goal status: 11/20/23 GOAL MET  2.  Pt will be able to apply multilayer, knee length, gradient, compression wraps to one leg at a time with max caregiver assistance to decrease limb volume, to limit infection risk, and to limit lymphedema progression.  Baseline: Dependent Goal status: 11/13/23 GOAL MET  LONG TERM GOALS: Target date: 12/12/22  Given this patient's Intake score of TBD % on the functional outcomes FOTO tool, patient will experience an increase in function of 5 points to improve basic and instrumental ADLs performance, including lymphedema self-care.  Baseline: Max A Goal status: GOAL DEFERRED.  FOTO tool discontinued.  2.  Given this patient's Intake score of 64.71 on the Lymphedema Life Impact Scale (LLIS), patient will experience a reduction of at least 5 points in her perceived level of functional impairment resulting from lymphedema to improve functional performance and quality of life (QOL). Baseline: 64.71% Goal status: PROGRESSING  3.  Pt will achieve at least a 10% volume reduction in B legs to return limb to typical size and shape, to limit infection risk and LE progression, to decrease pain, to improve function. Baseline: Dependent Goal status: PROGRESSING  4.  Pt will obtain proper compression garments/devices and achieve modified independence (extra time + assistive devices) with donning/doffing to optimize limb volume reductions and limit LE progression over time. Baseline:  Goal status: ONGOING PLAN:  OT FREQUENCY: 2x/week  OT DURATION: 12 weeks  PLANNED INTERVENTIONS: 97110-Therapeutic exercises, 97530- Therapeutic activity, 97535- Self Care, 28413- Manual therapy, Patient/Family education, Manual lymph drainage, Compression bandaging, DME instructions, and fitting with compression garments  Custom-made gradient compression garments and HOS devices are medically necessary because they are uniquely sized and shaped to fit the exact dimensions of the affected extremities, and to provide appropriate medical grade, graduated compression essential for optimally managing chronic, progressive lymphedema. Multiple custom compression garments are needed to ensure proper hygiene to limit  infection risk. Custom compression garments should be replaced q 3-6 months When worn consistently for optimal lipo-lymphedema self-management over time. HOS devices, medically necessary to limit fibrosis buildup in tissue, should be replaced q 2 years and PRN when worn out.     Consider replacing basic , vasopneumatic compression "pump" with an advanced pneumatic, sequential, compression  device, such as the Flexitouch, made by [employer]. The Flexitouch is medically necessary to facilitate improved lymphatic drainage by following specific lymphatic anatomy proximally to distally, including inguinal, deep abdominal, and/or axillary watersheds.   PLAN FOR NEXT SESSION:  LLE MLD and simultaneous skin care using low ph castor oil  (short neck sequence bilaterally, diaphragmatic breathing, functional inguinal LN , and proximal to distal J strokes     to thigh, knee, leg and foot. Retrograde J strokes x 3 to return to terminus.) Multilayer compression bandaging to L leg using 1 each 8, 10 and 12 cm wide x 5 meters long short stretch wrap over single layer of rosidal foam and cotton stockinett using gradient techniques. Pt instructed to remove wraps and report symptoms to her doctor if she experiences acute pain in the limb, signs/symptoms of infection, or atypical sob.  Pt / family edu for LE self care: bandaging  Loel Dubonnet, MS, OTR/L, CLT-LANA 11/23/23 9:53 AM

## 2023-11-27 ENCOUNTER — Ambulatory Visit: Payer: HMO | Attending: Podiatry | Admitting: Occupational Therapy

## 2023-11-27 ENCOUNTER — Telehealth: Payer: Self-pay | Admitting: Nurse Practitioner

## 2023-11-27 DIAGNOSIS — I890 Lymphedema, not elsewhere classified: Secondary | ICD-10-CM | POA: Insufficient documentation

## 2023-11-27 NOTE — Telephone Encounter (Signed)
Spoke with patient. Advised patient to go Urgent Care to get treatment for a possible UTI.  She in not currently taking the Diclocin.

## 2023-11-27 NOTE — Telephone Encounter (Signed)
Patient is coming in for a possible UTI tomorrow. She wanted someone to call her back to let her know if it is okay to take Diclocin in the meantime?

## 2023-11-28 ENCOUNTER — Ambulatory Visit: Payer: HMO | Admitting: Nurse Practitioner

## 2023-11-28 ENCOUNTER — Encounter: Payer: Self-pay | Admitting: Occupational Therapy

## 2023-11-28 NOTE — Therapy (Signed)
OUTPATIENT OCCUPATIONAL THERAPY TREATMENT NOTE  LOWER EXTREMITY LYMPHEDEMA Patient Name: [name] MRN: [id number] DOB:[birth year], 68 y.o., female Today's Date: 11/28/2023  END OF SESSION:   OT End of Session - 11/27/23 0951     Visit Number 7    Number of Visits 36    Date for OT Re-Evaluation 12/13/23    OT Start Time 0205    OT Stop Time 0305    OT Time Calculation (min) 60 min    Activity Tolerance Patient tolerated treatment well;No increased pain    Behavior During Therapy WFL for tasks assessed/performed             Past Medical History:  Diagnosis Date   Allergy    Anemia    Anxiety    Arthritis    Depression    Fibromyalgia    HTN (hypertension)    MRSA (methicillin resistant Staphylococcus aureus)    Obese    Plantar fasciitis    Pneumonia    Seasonal allergies    Sleep apnea 2012   took test in [place]-told to use cpap-could not ude   Past Surgical History:  Procedure Laterality Date   ANTERIOR CERVICAL DECOMP/DISCECTOMY FUSION  05/09/2008   C5-6, C6-7; with arthrodesis also   GASTRIC BYPASS  10/24/2000   KNEE ARTHROSCOPY  10/24/2002   rt and lt knee   ROTATOR CUFF REPAIR W/ DISTAL CLAVICLE EXCISION  01/06/2006   right   SHOULDER ARTHROSCOPY Left 10/24/2001   TONSILLECTOMY     TOTAL KNEE ARTHROPLASTY  03/29/2004   left   TOTAL KNEE ARTHROPLASTY  11/07/2003   right   TOTAL KNEE REVISION  07/30/2010   right; with lateral release   UPPER GASTROINTESTINAL ENDOSCOPY     Patient Active Problem List   Diagnosis Date Noted   Essential hypertension 02/14/2022   Pneumonia 06/10/2017   CAP (community acquired pneumonia) 06/09/2017   AKI (acute kidney injury) (HCC) 06/09/2017   ADHD, 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   Insomnia    Cellulitis and abscess of leg 06/19/2016   Pyoderma 02/11/2016   Cellulitis of left lower extremity 01/09/2016   Psoriatic arthritis (HCC)    Cough    Wheezing    Chronic anemia 12/05/2015   Chronic  venous insufficiency 12/04/2015   Hoarding disorder 03/05/2015   Alcohol abuse 11/21/2014   Anxiety state 11/21/2014   Morbid obesity with BMI of 40.0-44.9, adult (HCC) 11/21/2014   Epistaxis 09/24/2014   Bruising 05/03/2014   Hematemesis 03/07/2014   Depressive disorder 12/09/2013   GAD (generalized anxiety disorder) 09/17/2013   Fibromyalgia 07/22/2013    PCP: Adela Glimpse, NP  REFERRING PROVIDER: Lucia Estelle, DPM  REFERRING DIAG: I89.0  THERAPY DIAG:  Lymphedema, not elsewhere classified  Rationale for Evaluation and Treatment: Rehabilitation  ONSET DATE: "I've had leg swelling for years. I don't know when it started. " + family hx of leg swelling-mother  SUBJECTIVE:  SUBJECTIVE STATEMENT: [name] returns to OT to commence CDT to RLE. She is accompanied by her spouse, [name]. Pt has no new complaints.  Pt denies LE-related pain. She arrives without LLE compression wraps in place. She tells me that she tolerated them well without any difficulty. Spouse applied wraps daily between visits.  PERTINENT HISTORY: Pls see active problem list above  PAIN:  Are you having LE related pain? No: NPRS scale: 0/10 Pain location: feet Pain description: burning Aggravating factors: walking Relieving factors: elevation  PRECAUTIONS: Other: LYMPHEDEMA PRECAUTIONS  WEIGHT BEARING RESTRICTIONS: No  FALLS:  Has patient fallen in last 6 months? 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   OBJECTIVE: Note: Objective measures were completed at Evaluation unless otherwise noted.  COGNITION:  Overall cognitive status: frequent redirection needed during eval to stay on topic    POSTURE: WFL  LE ROM: grossly WFL  STRENGTH: grossly WFL   BLE COMPARATIVE LIMB VOLUMETRICS INITIAL 11/01/23  LANDMARK RIGHT  (dominant)  R LEG (A-D) 4296.6 ml  R THIGH (E-G) ml  R FULL LIMB (A-G) ml  Limb Volume differential (LVD)  %  Volume change since initial %  Volume change overall V  (Blank rows = not tested)  LANDMARK LEFT    L LEG (A-D) 4829.1 ml  L  THIGH (E-G) ml  L FULL LIMB (A-G) ml  Limb Volume differential (LVD)   12.4%, L>R  Volume change since initial %  Volume change overall %  (Blank rows = not tested)   Mild, Stage  II, 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 Obesity  Skin  Description Hyper-Keratosis Peau' de Orange Shiny Tight Fibrotic/ Indurated Fatty Doughy Spongy/ boggy   x    Bilateral Chronic Lipo dermatosclerosis Hardened, thickened, leathery      Skin dry Flaky WNL Macerated   mildly x     Color Redness Varicosities Blanching Hemosiderin Stain Mottled   x x x  Severe bilaterally x   Odor Malodorous Yeast Fungal infection  WNL      x   Temperature Warm Cool wnl    x     Pitting Edema   1+ 2+ 3+ 4+ Non-pitting         x   Girth Symmetrical Asymmetrical                   Distribution    L>R toes to popliteal     Stemmer Sign Positive Negative   +    Lymphorrhea History Of:  Present Absent     x    Wounds History Of Present Absent Venous Arterial Pressure Sheer     x        Signs of Infection Redness Warmth Erythema Acute Swelling Drainage Borders                    Sensation Light Touch Deep  pressure Hypersensitivity Neuropathic pain   In Tact Impaired In tact Impaired Absent Impaired Plantar surfaces bilaterally  x  x  x      Nails WNL   Fungus nail dystrophy     x   Hair Growth Symmetrical Asymmetrical   x    Skin Creases Base of toes  Ankles   Base of Fingers knees       Abdominal pannus Thigh Lobules  Face/neck   x           TODAY'S TREATMENT:                                                                                                                                         Commenced MLD to LLE/LLQ using functional inguinal LN Compression wraps to LLE as established.  PATIENT EDUCATION:  Continued Pt/ CG edu for lymphedema self care home program throughout session. Topics include outcome of comparative limb volumetrics- starting limb volume differentials (LVDs), technology and gradient techniques used for short stretch, multilayer compression wrapping, simple self-MLD, therapeutic lymphatic pumping exercises, skin/nail care, LE precautions,. compression garment recommendations and specifications, wear and care schedule and compression garment donning / doffing w assistive devices. Discussed progress towards all OT goals since commencing CDT. All questions answered to the Pt's satisfaction. Good return. Person educated: Patient  Education method: Explanation, Demonstration, and Handouts Education comprehension: verbalized understanding, returned demonstration, verbal cues required, and needs further education  HOME EXERCISE PROGRAM: BLE lymphatic pumping there ex using- 1 sets of 10 reps, each exercise in order-  1-2 x daily, bilaterally Simple self MLD 1 x daily Daily skin care to increase hydration, skin mobility and decrease infection risk- can be done during MLD Compression wraps 23/7 until garment fitting complete   ASSESSMENT  CLINICAL IMPRESSION:Skin color, temperature and subcutaneous fibrosis and hardening of the skin are unchanged since last  visit. Pt tolerated LLE/LLQ MLD and simultaneous skin care  without increased pain. Skin below the knee remains indurated with minimal excursion during MLD in keeping with presentation of lipo dermatosclerosis. Pt tolerated reapplication of compression wraps without increased pain. Cont as per POC.   (Initial Eval 09/13/23: Shan Padgett is a 68 y.o. 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 Pt denies hx of known, chronic limb swelling in her family. She does not utilize compression garments, or a vasopneumatic compression "pump" as conservative measures. Chronic limb swelling and associated pain limits functional performance in all occupational domains, including functional ambulation and mobility, self-care and basic and instrumental ADLs. It limits participation in leisure pursuits, productive activities and social participle[ation.   Pt will benefit from  skilled Occupational Therapy for both  Intensive and Self -management phases of Complete Decongestive Therapy (CDT) to reduce limb swelling and associated pain, to limit progression, to reduce infection  risk , and to increase functional performance, social participation and quality of life. CDT include manual lymphatic drainage (MLD), skin care, therapeutic exercise and compression therapies- multilayer bandaging initially, one limb at a time,  and then appropriate compression garments, or alternatives in the final phase. Pt and caregivers who assist are trained to perform a;; aspects of Lymphedema self-care top ensure optimal self management over time. Without skilled  OT for CDT, further progression of lymphedema is certain and further functional decline is expected.)   OBJECTIVE IMPAIRMENTS: decreased  activity tolerance, decreased knowledge of condition, decreased knowledge of use of DME, increased edema, pain, and chronic leg swelling .   ACTIVITY LIMITATIONS: limited functional ambulation and functional mobility 2/2 lymphedema -related foot and leg pain; standing, squatting, stairs, bathing, toileting, dressing, and hygiene/grooming  PARTICIPATION LIMITATIONS: meal prep, cooking, cleaning, driving, shopping, community activity, and volunteering  PERSONAL FACTORS: Time since onset of injury/illness/exacerbation are also affecting patient's functional outcome.   REHAB POTENTIAL: Good  CLINICAL DECISION MAKING: Evolving/moderate complexity  EVALUATION COMPLEXITY: Moderate   GOALS: Goals reviewed with patient? Yes  SHORT TERM GOALS: Target date: 4th OT Rx visit   Pt will demonstrate understanding of lymphedema precautions and prevention strategies with modified independence using a printed reference to identify at least 5 precautions and discussing how s/he may implement them into daily life to reduce risk of progression with extra time. Baseline:Max A Goal status: 11/20/23 GOAL MET  2.  Pt will be able to apply multilayer, knee length, gradient, compression wraps to one leg at a time with max caregiver assistance to decrease limb volume, to limit infection risk, and to limit lymphedema progression.  Baseline: Dependent Goal status: 11/13/23 GOAL MET  LONG TERM GOALS: Target date: 12/12/22  Given this patient's Intake score of TBD % on the functional outcomes FOTO tool, patient will experience an increase in function of 5 points to improve basic and instrumental ADLs performance, including lymphedema self-care.  Baseline: Max A Goal status: GOAL DEFERRED. FOTO tool discontinued.  2.  Given this patient's Intake score of 64.71 on the Lymphedema Life Impact Scale (LLIS), patient will experience a reduction of at least 5 points in her perceived level of functional impairment resulting  from lymphedema to improve functional performance and quality of life (QOL). Baseline: 64.71% Goal status: PROGRESSING  3.  Pt will achieve at least a 10% volume reduction in B legs to return limb to typical size and shape, to limit infection risk and LE progression, to decrease pain, to improve function. Baseline: Dependent Goal status: PROGRESSING  4.  Pt will obtain proper compression garments/devices and achieve modified independence (extra time + assistive devices) with donning/doffing to optimize limb volume reductions and limit LE progression over time. Baseline:  Goal status: ONGOING PLAN:  OT FREQUENCY: 2x/week  OT DURATION: 12 weeks  PLANNED INTERVENTIONS: 97110-Therapeutic exercises, 97530- Therapeutic activity, 97535- Self Care, 40981- Manual therapy, Patient/Family education, Manual lymph drainage, Compression bandaging, DME instructions, and fitting with compression garments  Custom-made gradient compression garments and HOS devices are medically necessary because they are uniquely sized and shaped to fit the exact dimensions of the affected extremities, and to provide appropriate medical grade, graduated compression essential for optimally managing chronic, progressive lymphedema. Multiple custom compression garments are needed to ensure proper hygiene to limit infection risk. Custom compression garments should be replaced q 3-6 months When worn consistently for optimal lipo-lymphedema self-management over time. HOS devices, medically necessary to limit fibrosis buildup in tissue, should be replaced q  2 years and PRN when worn out.     Consider replacing basic , vasopneumatic compression "pump" with an advanced pneumatic, sequential, compression device, such as the Flexitouch, made by [employer]. The Flexitouch is medically necessary to facilitate improved lymphatic drainage by following specific lymphatic anatomy proximally to distally, including inguinal, deep abdominal,  and/or axillary watersheds.   PLAN FOR NEXT SESSION:  LLE MLD and simultaneous skin care using low ph castor oil  (short neck sequence bilaterally, diaphragmatic breathing, functional inguinal LN , and proximal to distal J strokes     to thigh, knee, leg and foot. Retrograde J strokes x 3 to return to terminus.) Multilayer compression bandaging to L leg using 1 each 8, 10 and 12 cm wide x 5 meters long short stretch wrap over single layer of rosidal foam and cotton stockinett using gradient techniques. Pt instructed to remove wraps and report symptoms to her doctor if she experiences acute pain in the limb, signs/symptoms of infection, or atypical sob.  Pt / family edu for LE self care: bandaging  Loel Dubonnet, MS, OTR/L, CLT-LANA 11/28/23 12:34 PM

## 2023-11-29 ENCOUNTER — Encounter: Payer: Self-pay | Admitting: Occupational Therapy

## 2023-11-29 ENCOUNTER — Ambulatory Visit: Payer: HMO | Admitting: Occupational Therapy

## 2023-11-29 DIAGNOSIS — I890 Lymphedema, not elsewhere classified: Secondary | ICD-10-CM | POA: Diagnosis not present

## 2023-11-29 NOTE — Therapy (Signed)
 OUTPATIENT OCCUPATIONAL THERAPY TREATMENT NOTE  LOWER EXTREMITY LYMPHEDEMA Patient Name: [name] MRN: [id number] DOB:Sep 08, 1956, 68 y.o., female Today's Date: 11/29/2023  END OF SESSION:   OT End of Session - 11/29/23 1412     Visit Number 8    Number of Visits 36    Date for OT Re-Evaluation 12/13/23    OT Start Time 0205    Activity Tolerance Patient tolerated treatment well;No increased pain    Behavior During Therapy WFL for tasks assessed/performed             Past Medical History:  Diagnosis Date   Allergy    Anemia    Anxiety    Arthritis    Depression    Fibromyalgia    HTN (hypertension)    MRSA (methicillin resistant Staphylococcus aureus)    Obese    Plantar fasciitis    Pneumonia    Seasonal allergies    Sleep apnea 2012   took test in [place]-told to use cpap-could not ude   Past Surgical History:  Procedure Laterality Date   ANTERIOR CERVICAL DECOMP/DISCECTOMY FUSION  05/09/2008   C5-6, C6-7; with arthrodesis also   GASTRIC BYPASS  10/24/2000   KNEE ARTHROSCOPY  10/24/2002   rt and lt knee   ROTATOR CUFF REPAIR W/ DISTAL CLAVICLE EXCISION  01/06/2006   right   SHOULDER ARTHROSCOPY Left 10/24/2001   TONSILLECTOMY     TOTAL KNEE ARTHROPLASTY  03/29/2004   left   TOTAL KNEE ARTHROPLASTY  11/07/2003   right   TOTAL KNEE REVISION  07/30/2010   right; with lateral release   UPPER GASTROINTESTINAL ENDOSCOPY     Patient Active Problem List   Diagnosis Date Noted   Essential hypertension 02/14/2022   Pneumonia 06/10/2017   CAP (community acquired pneumonia) 06/09/2017   AKI (acute kidney injury) (HCC) 06/09/2017   ADHD, 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   Insomnia    Cellulitis and abscess of leg 06/19/2016   Pyoderma 02/11/2016   Cellulitis of left lower extremity 01/09/2016   Psoriatic arthritis (HCC)    Cough    Wheezing    Chronic anemia 12/05/2015   Chronic venous insufficiency 12/04/2015   Hoarding disorder  03/05/2015   Alcohol abuse 11/21/2014   Anxiety state 11/21/2014   Morbid obesity with BMI of 40.0-44.9, adult (HCC) 11/21/2014   Epistaxis 09/24/2014   Bruising 05/03/2014   Hematemesis 03/07/2014   Depressive disorder 12/09/2013   GAD (generalized anxiety disorder) 09/17/2013   Fibromyalgia 07/22/2013    PCP: Bascom Necessary, NP  REFERRING PROVIDER: Awanda Imperial, DPM  REFERRING DIAG: I89.0  THERAPY DIAG:  Lymphedema, not elsewhere classified  Rationale for Evaluation and Treatment: Rehabilitation  ONSET DATE: I've had leg swelling for years. I don't know when it started.  + family hx of leg swelling-mother  SUBJECTIVE:  SUBJECTIVE STATEMENT: [name] returns to OT to commence CDT to RLE. She is accompanied by her spouse, [name]. Pt has no new complaints.  Pt denies LE-related pain. She arrives without LLE compression wraps in place. She tells me that she tolerated them well without any difficulty. Spouse applied wraps daily between visits.  PERTINENT HISTORY: Pls see active problem list above  PAIN:  Are you having LE related pain? No: NPRS scale: 0/10 Pain location: feet Pain description: burning Aggravating factors: walking Relieving factors: elevation  PRECAUTIONS: Other: LYMPHEDEMA PRECAUTIONS  WEIGHT BEARING RESTRICTIONS: No  FALLS:  Has patient fallen in last 6 months? 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   OBJECTIVE: Note: Objective measures were completed at Evaluation unless otherwise noted.  COGNITION:  Overall cognitive status: frequent redirection needed during eval to stay on topic    POSTURE: WFL  LE ROM: grossly WFL  STRENGTH: grossly WFL   BLE COMPARATIVE LIMB VOLUMETRICS INITIAL 11/01/23  LANDMARK RIGHT  (dominant)  R LEG (A-D) 4296.6 ml  R THIGH (E-G) ml  R FULL LIMB (A-G) ml  Limb Volume differential (LVD)  %  Volume change since initial %  Volume change overall V  (Blank rows = not tested)  LANDMARK LEFT    L LEG (A-D) 4829.1 ml  L  THIGH (E-G) ml  L FULL LIMB (A-G) ml  Limb Volume differential (LVD)   12.4%, L>R  Volume change since initial %  Volume change overall %  (Blank rows = not tested)   Mild, Stage  II, 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 Obesity  Skin  Description Hyper-Keratosis Peau' de Orange Shiny Tight Fibrotic/ Indurated Fatty Doughy Spongy/ boggy   x    Bilateral Chronic Lipo dermatosclerosis Hardened, thickened, leathery      Skin dry Flaky WNL Macerated   mildly x     Color Redness Varicosities Blanching Hemosiderin Stain Mottled   x x x  Severe bilaterally x   Odor Malodorous Yeast Fungal infection  WNL      x   Temperature Warm Cool wnl    x     Pitting Edema   1+ 2+ 3+ 4+ Non-pitting         x   Girth Symmetrical Asymmetrical                   Distribution    L>R toes to popliteal     Stemmer Sign Positive Negative   +    Lymphorrhea History Of:  Present Absent     x    Wounds History Of Present Absent Venous Arterial Pressure Sheer     x        Signs of Infection Redness Warmth Erythema Acute Swelling Drainage Borders                    Sensation Light Touch Deep pressure Hypersensitivity Neuropathic pain   In Tact Impaired In tact  Impaired Absent Impaired Plantar surfaces bilaterally  x  x  x      Nails WNL   Fungus nail dystrophy     x   Hair Growth Symmetrical Asymmetrical   x    Skin Creases Base of toes  Ankles   Base of Fingers knees       Abdominal pannus Thigh Lobules  Face/neck   x           TODAY'S TREATMENT:                                                                                                                                         Commenced MLD to LLE/LLQ using functional inguinal LN Compression wraps to LLE as established.  PATIENT EDUCATION:  Continued Pt/ CG edu for lymphedema self care home program throughout session. Topics include outcome of comparative limb volumetrics- starting limb volume differentials (LVDs), technology and gradient techniques used for short stretch, multilayer compression wrapping, simple self-MLD, therapeutic lymphatic pumping exercises, skin/nail care, LE precautions,. compression garment recommendations and specifications, wear and care schedule and compression garment donning / doffing w assistive devices. Discussed progress towards all OT goals since commencing CDT. All questions answered to the Pt's satisfaction. Good return. Person educated: Patient  Education method: Explanation, Demonstration, and Handouts Education comprehension: verbalized understanding, returned demonstration, verbal cues required, and needs further education  HOME EXERCISE PROGRAM: BLE lymphatic pumping there ex using- 1 sets of 10 reps, each exercise in order-  1-2 x daily, bilaterally Simple self MLD 1 x daily Daily skin care to increase hydration, skin mobility and decrease infection risk- can be done during MLD Compression wraps 23/7 until garment fitting complete   ASSESSMENT  CLINICAL IMPRESSION:Skin color, temperature and subcutaneous fibrosis and hardening of the skin are unchanged since last visit. Pt tolerated LLE/LLQ MLD and simultaneous skin care  without  increased pain. Skin below the knee remains indurated with minimal excursion during MLD in keeping with presentation of lipo dermatosclerosis. Pt tolerated reapplication of compression wraps without increased pain. Cont as per POC.   (Initial Eval 09/13/23: Stanisha Lorenz is a 68 y.o. 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 Pt denies hx of known, chronic limb swelling in her family. She does not utilize compression garments, or a vasopneumatic compression pump as conservative measures. Chronic limb swelling and associated pain limits functional performance in all occupational domains, including functional ambulation and mobility, self-care and basic and instrumental ADLs. It limits participation in leisure pursuits, productive activities and social participle[ation.   Pt will benefit from  skilled Occupational Therapy for both  Intensive and Self -management phases of Complete Decongestive Therapy (CDT) to reduce limb swelling and associated pain, to limit progression, to reduce infection  risk , and to increase functional performance, social participation and quality of life. CDT include manual lymphatic drainage (MLD), skin care, therapeutic exercise and compression therapies- multilayer bandaging initially, one limb at a time,  and then appropriate compression garments, or alternatives in the final phase. Pt and caregivers who assist are trained to perform a;; aspects of Lymphedema self-care top ensure optimal self management over time. Without skilled  OT for CDT, further progression of lymphedema is certain and further functional decline is expected.)   OBJECTIVE IMPAIRMENTS: decreased activity tolerance, decreased knowledge of condition, decreased  knowledge of use of DME, increased edema, pain, and chronic leg swelling .   ACTIVITY LIMITATIONS: limited functional ambulation and functional mobility 2/2 lymphedema -related foot and leg pain; standing, squatting, stairs, bathing, toileting, dressing, and hygiene/grooming  PARTICIPATION LIMITATIONS: meal prep, cooking, cleaning, driving, shopping, community activity, and volunteering  PERSONAL FACTORS: Time since onset of injury/illness/exacerbation are also affecting patient's functional outcome.   REHAB POTENTIAL: Good  CLINICAL DECISION MAKING: Evolving/moderate complexity  EVALUATION COMPLEXITY: Moderate   GOALS: Goals reviewed with patient? Yes  SHORT TERM GOALS: Target date: 4th OT Rx visit   Pt will demonstrate understanding of lymphedema precautions and prevention strategies with modified independence using a printed reference to identify at least 5 precautions and discussing how s/he may implement them into daily life to reduce risk of progression with extra time. Baseline:Max A Goal status: 11/20/23 GOAL MET  2.  Pt will be able to apply multilayer, knee length, gradient, compression wraps to one leg at a time with max caregiver assistance to decrease limb volume, to limit infection risk, and to limit lymphedema progression.  Baseline: Dependent Goal status: 11/13/23 GOAL MET  LONG TERM GOALS: Target date: 12/12/22  Given this patient's Intake score of TBD % on the functional outcomes FOTO tool, patient will experience an increase in function of 5 points to improve basic and instrumental ADLs performance, including lymphedema self-care.  Baseline: Max A Goal status: GOAL DEFERRED. FOTO tool discontinued.  2.  Given this patient's Intake score of 64.71 on the Lymphedema Life Impact Scale (LLIS), patient will experience a reduction of at least 5 points in her perceived level of functional impairment resulting from lymphedema to improve functional performance and quality of  life (QOL). Baseline: 64.71% Goal status: PROGRESSING  3.  Pt will achieve at least a 10% volume reduction in B legs to return limb to typical size and shape, to limit infection risk and LE progression, to decrease pain, to improve function. Baseline: Dependent Goal status: PROGRESSING  4.  Pt will obtain proper compression garments/devices and achieve modified independence (extra time + assistive devices) with donning/doffing to optimize limb volume reductions and limit LE progression over time. Baseline:  Goal status: ONGOING PLAN:  OT FREQUENCY: 2x/week  OT DURATION: 12 weeks  PLANNED INTERVENTIONS: 97110-Therapeutic exercises, 97530- Therapeutic activity, 97535- Self Care, [id number]- Manual therapy, Patient/Family education, Manual lymph drainage, Compression bandaging, DME instructions, and fitting with compression garments  Custom-made gradient compression garments and HOS devices are medically necessary because they are uniquely sized and shaped to fit the exact dimensions of the affected extremities, and to provide appropriate medical grade, graduated compression essential for optimally managing chronic, progressive lymphedema. Multiple custom compression garments are needed to ensure proper hygiene to limit infection risk. Custom compression garments should be replaced q 3-6 months When worn consistently for optimal lipo-lymphedema self-management over time. HOS devices, medically necessary to limit fibrosis buildup in tissue, should be replaced q  2 years and PRN when worn out.     Consider replacing basic , vasopneumatic compression pump with an advanced pneumatic, sequential, compression device, such as the Flexitouch, made by [employer]. The Flexitouch is medically necessary to facilitate improved lymphatic drainage by following specific lymphatic anatomy proximally to distally, including inguinal, deep abdominal, and/or axillary watersheds.   PLAN FOR NEXT SESSION:  LLE MLD  and simultaneous skin care using low ph castor oil  (short neck sequence bilaterally, diaphragmatic breathing, functional inguinal LN , and proximal to distal J strokes     to thigh, knee, leg and foot. Retrograde J strokes x 3 to return to terminus.) Multilayer compression bandaging to L leg using 1 each 8, 10 and 12 cm wide x 5 meters long short stretch wrap over single layer of rosidal foam and cotton stockinett using gradient techniques. Pt instructed to remove wraps and report symptoms to her doctor if she experiences acute pain in the limb, signs/symptoms of infection, or atypical sob.  Pt / family edu for LE self care: bandaging  Zebedee Dec, MS, OTR/L, CLT-LANA 11/29/23 2:13 PM

## 2023-11-30 ENCOUNTER — Other Ambulatory Visit: Payer: Self-pay | Admitting: Internal Medicine

## 2023-12-04 ENCOUNTER — Encounter: Payer: BC Managed Care – PPO | Admitting: Occupational Therapy

## 2023-12-05 ENCOUNTER — Encounter: Payer: Self-pay | Admitting: *Deleted

## 2023-12-06 ENCOUNTER — Ambulatory Visit: Payer: HMO | Admitting: Occupational Therapy

## 2023-12-06 DIAGNOSIS — I890 Lymphedema, not elsewhere classified: Secondary | ICD-10-CM | POA: Diagnosis not present

## 2023-12-06 NOTE — Therapy (Signed)
OUTPATIENT OCCUPATIONAL THERAPY TREATMENT NOTE  LOWER EXTREMITY LYMPHEDEMA Patient Name: [name] MRN: [id number] DOB:[birth year], 68 y.o., female Today's Date: 12/06/2023  END OF SESSION:   OT End of Session - 12/06/23 1418     Visit Number 9    Number of Visits 36    Date for OT Re-Evaluation 12/13/23    OT Start Time 0208    OT Stop Time 0310    OT Time Calculation (min) 62 min    Activity Tolerance Patient tolerated treatment well;No increased pain    Behavior During Therapy WFL for tasks assessed/performed             Past Medical History:  Diagnosis Date   Allergy    Anemia    Anxiety    Arthritis    Depression    Fibromyalgia    HTN (hypertension)    MRSA (methicillin resistant Staphylococcus aureus)    Obese    Plantar fasciitis    Pneumonia    Seasonal allergies    Sleep apnea 2012   took test in [place]-told to use cpap-could not ude   Past Surgical History:  Procedure Laterality Date   ANTERIOR CERVICAL DECOMP/DISCECTOMY FUSION  05/09/2008   C5-6, C6-7; with arthrodesis also   GASTRIC BYPASS  10/24/2000   KNEE ARTHROSCOPY  10/24/2002   rt and lt knee   ROTATOR CUFF REPAIR W/ DISTAL CLAVICLE EXCISION  01/06/2006   right   SHOULDER ARTHROSCOPY Left 10/24/2001   TONSILLECTOMY     TOTAL KNEE ARTHROPLASTY  03/29/2004   left   TOTAL KNEE ARTHROPLASTY  11/07/2003   right   TOTAL KNEE REVISION  07/30/2010   right; with lateral release   UPPER GASTROINTESTINAL ENDOSCOPY     Patient Active Problem List   Diagnosis Date Noted   Essential hypertension 02/14/2022   Pneumonia 06/10/2017   CAP (community acquired pneumonia) 06/09/2017   AKI (acute kidney injury) (HCC) 06/09/2017   ADHD, 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   Insomnia    Cellulitis and abscess of leg 06/19/2016   Pyoderma 02/11/2016   Cellulitis of left lower extremity 01/09/2016   Psoriatic arthritis (HCC)    Cough    Wheezing    Chronic anemia 12/05/2015   Chronic  venous insufficiency 12/04/2015   Hoarding disorder 03/05/2015   Alcohol abuse 11/21/2014   Anxiety state 11/21/2014   Morbid obesity with BMI of 40.0-44.9, adult (HCC) 11/21/2014   Epistaxis 09/24/2014   Bruising 05/03/2014   Hematemesis 03/07/2014   Depressive disorder 12/09/2013   GAD (generalized anxiety disorder) 09/17/2013   Fibromyalgia 07/22/2013    PCP: Adela Glimpse, NP  REFERRING PROVIDER: Lucia Estelle, DPM  REFERRING DIAG: I89.0  THERAPY DIAG:  Lymphedema, not elsewhere classified  Rationale for Evaluation and Treatment: Rehabilitation  ONSET DATE: "I've had leg swelling for years. I don't know when it started. " + family hx of leg swelling-mother  SUBJECTIVE:  SUBJECTIVE STATEMENT: JACKQUELYN SUNDBERG returns to OT to commence CDT to RLE. She is accompanied by her spouse, [name]. Pt has no new complaints.  Pt denies LE-related pain. She arrives without LLE compression wraps in place. She tells me that she tolerated them well without any difficulty. She feels her leg swelling is increased today after being on her feet and not wrapping over the weekend while preparing for and hosting her husband's 80th birthday party.   PERTINENT HISTORY: Pls see active problem list above  PAIN:  Are you having LE related pain? No: NPRS scale: 0/10 Pain location: feet Pain description: burning Aggravating factors: walking Relieving factors: elevation  PRECAUTIONS: Other: LYMPHEDEMA PRECAUTIONS  WEIGHT BEARING RESTRICTIONS: No  FALLS:  Has patient fallen in last 6 months? 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   OBJECTIVE: Note: Objective measures were completed at Evaluation unless otherwise noted.  COGNITION:  Overall cognitive status: frequent redirection needed during eval to stay on topic    POSTURE: WFL  LE ROM: grossly WFL  STRENGTH: grossly WFL   BLE COMPARATIVE LIMB VOLUMETRICS INITIAL 11/01/23  LANDMARK RIGHT  (dominant)  R LEG (A-D) 4296.6 ml  R THIGH (E-G) ml  R FULL LIMB (A-G) ml  Limb Volume differential (LVD)  %  Volume change since initial %  Volume change overall V  (Blank rows = not tested)  LANDMARK LEFT    L LEG (A-D) 4829.1 ml  L  THIGH (E-G) ml  L FULL LIMB (A-G) ml  Limb Volume differential (LVD)   12.4%, L>R  Volume change since initial %  Volume change overall %  (Blank rows = not tested)    9th Visit 12/06/23        LANDMARK LEFT    L LEG (A-D) 4888.8 ml  L  THIGH (E-G) ml  L FULL LIMB (A-G) ml  Limb Volume differential (LVD)     Volume change since initial  + 1.2 %  Volume change overall %  (Blank rows = not tested)    Mild, Stage  II, 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 Obesity  Skin  Description Hyper-Keratosis Peau' de Orange Shiny Tight Fibrotic/ Indurated Fatty Doughy Spongy/ boggy   x    Bilateral Chronic Lipo dermatosclerosis Hardened, thickened, leathery      Skin dry Flaky WNL Macerated   mildly x     Color Redness Varicosities Blanching Hemosiderin Stain Mottled   x x x  Severe bilaterally x   Odor Malodorous Yeast Fungal infection  WNL      x   Temperature Warm Cool wnl    x     Pitting Edema   1+ 2+ 3+ 4+ Non-pitting         x   Girth Symmetrical Asymmetrical                    Distribution    L>R toes to popliteal     Stemmer Sign Positive Negative   +  Lymphorrhea History Of:  Present Absent     x    Wounds History Of Present Absent Venous Arterial Pressure Sheer     x        Signs of Infection Redness Warmth Erythema Acute Swelling Drainage Borders                    Sensation Light Touch Deep pressure Hypersensitivity Neuropathic pain   In Tact Impaired In tact Impaired Absent Impaired Plantar surfaces bilaterally   x  x  x      Nails WNL   Fungus nail dystrophy     x   Hair Growth Symmetrical Asymmetrical   x    Skin Creases Base of toes  Ankles   Base of Fingers knees       Abdominal pannus Thigh Lobules  Face/neck   x           TODAY'S TREATMENT:                                                                                                                                         Commenced MLD to LLE/LLQ using functional inguinal LN Compression wraps to LLE as established.  PATIENT EDUCATION:  Continued Pt/ CG edu for lymphedema self care home program throughout session. Topics include outcome of comparative limb volumetrics- starting limb volume differentials (LVDs), technology and gradient techniques used for short stretch, multilayer compression wrapping, simple self-MLD, therapeutic lymphatic pumping exercises, skin/nail care, LE precautions,. compression garment recommendations and specifications, wear and care schedule and compression garment donning / doffing w assistive devices. Discussed progress towards all OT goals since commencing CDT. All questions answered to the Pt's satisfaction. Good return. Person educated: Patient  Education method: Explanation, Demonstration, and Handouts Education comprehension: verbalized understanding, returned demonstration, verbal cues required, and needs further education  HOME EXERCISE PROGRAM: BLE lymphatic pumping there ex using- 1 sets of 10 reps, each exercise in order-   1-2 x daily, bilaterally Simple self MLD 1 x daily Daily skin care to increase hydration, skin mobility and decrease infection risk- can be done during MLD Compression wraps 23/7 until garment fitting complete   ASSESSMENT  CLINICAL IMPRESSION:Completed comparative limb volumetrics in prep for upcoming progress report. Volumetrics reveal treatment limb, the L leg, is increased in volume by 1.23% since commencing OT for CDT on 11/01/23. Pt states she has been diligent with compression between visits, but she was unwrapped and on her feet quite a bit over the past weekend. Provided skijn care with Eucerine lotion during final 15 minutes o visit and provided Pt edu throughout this session about the critical importance of consaistent daily compression throughout the Intesive Phase of Rx to achieve optimal volume reduction. Pt tolerated reapplication of compression wraps without increased pain. Cont as per POC.   (Initial Eval 09/13/23: Dashae Wilcher is a 68 y.o. 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 Pt denies hx of known, chronic limb swelling in her family. She does not utilize compression garments, or a vasopneumatic compression "pump" as conservative measures. Chronic limb swelling and associated pain limits functional performance in all occupational domains, including functional ambulation and mobility, self-care and basic and instrumental ADLs. It limits participation in leisure pursuits, productive activities and social participle[ation.   Pt will benefit from  skilled Occupational Therapy for both  Intensive and Self -management phases of Complete Decongestive Therapy (CDT) to reduce limb swelling and associated pain, to limit progression, to reduce  infection risk , and to increase functional performance, social participation and quality of life. CDT include manual lymphatic drainage (MLD), skin care, therapeutic exercise and compression therapies- multilayer bandaging initially, one limb at a time,  and then appropriate compression garments, or alternatives in the final phase. Pt and caregivers who assist are trained to perform a;; aspects of Lymphedema self-care top ensure optimal self management over time. Without skilled  OT for CDT, further progression of lymphedema is certain and further functional decline is expected.)   OBJECTIVE IMPAIRMENTS: decreased activity tolerance, decreased knowledge of condition, decreased knowledge of use of DME, increased edema, pain, and chronic leg swelling .   ACTIVITY LIMITATIONS: limited functional ambulation and functional mobility 2/2 lymphedema -related foot and leg pain; standing, squatting, stairs, bathing, toileting, dressing, and hygiene/grooming  PARTICIPATION LIMITATIONS: meal prep, cooking, cleaning, driving, shopping, community activity, and volunteering  PERSONAL FACTORS: Time since onset of injury/illness/exacerbation are also affecting patient's functional outcome.   REHAB POTENTIAL: Good  CLINICAL DECISION MAKING: Evolving/moderate complexity  EVALUATION COMPLEXITY: Moderate   GOALS: Goals reviewed with patient? Yes  SHORT TERM GOALS: Target date: 4th OT Rx visit   Pt will demonstrate understanding of lymphedema precautions and prevention strategies with modified independence using a printed reference to identify at least 5 precautions and discussing how s/he may implement them into daily life to reduce risk of progression with extra time. Baseline:Max A Goal status: 11/20/23 GOAL MET  2.  Pt will be able to apply multilayer, knee length, gradient, compression wraps to one leg at a time with max caregiver assistance to decrease limb volume, to limit infection risk, and to limit  lymphedema progression.  Baseline: Dependent Goal status: 11/13/23 GOAL MET  LONG TERM GOALS: Target date: 12/12/22  Given this patient's Intake score of TBD % on the functional outcomes FOTO tool, patient will experience an increase in function of 5 points to improve basic and instrumental ADLs performance, including lymphedema self-care.  Baseline: Max A Goal status: GOAL DEFERRED. FOTO tool discontinued.  2.  Given this patient's Intake score of 64.71 on the Lymphedema Life Impact Scale (LLIS), patient will experience a reduction of at least 5 points in her perceived level of functional impairment resulting from lymphedema to improve functional performance and quality of life (QOL). Baseline: 64.71% Goal status: PROGRESSING  3.  Pt will achieve at least a 10% volume reduction in B legs to return limb to typical size and shape, to limit infection risk and LE progression, to decrease pain, to improve function. Baseline: Dependent Goal status: PROGRESSING  4.  Pt will obtain proper compression garments/devices and achieve modified independence (extra time + assistive devices) with donning/doffing to optimize limb volume reductions and limit LE  progression over time. Baseline:  Goal status: ONGOING PLAN:  OT FREQUENCY: 2x/week  OT DURATION: 12 weeks  PLANNED INTERVENTIONS: 97110-Therapeutic exercises, 97530- Therapeutic activity, 97535- Self Care, 16109- Manual therapy, Patient/Family education, Manual lymph drainage, Compression bandaging, DME instructions, and fitting with compression garments  Custom-made gradient compression garments and HOS devices are medically necessary because they are uniquely sized and shaped to fit the exact dimensions of the affected extremities, and to provide appropriate medical grade, graduated compression essential for optimally managing chronic, progressive lymphedema. Multiple custom compression garments are needed to ensure proper hygiene to limit  infection risk. Custom compression garments should be replaced q 3-6 months When worn consistently for optimal lipo-lymphedema self-management over time. HOS devices, medically necessary to limit fibrosis buildup in tissue, should be replaced q 2 years and PRN when worn out.     Consider replacing basic , vasopneumatic compression "pump" with an advanced pneumatic, sequential, compression device, such as the Flexitouch, made by [employer]. The Flexitouch is medically necessary to facilitate improved lymphatic drainage by following specific lymphatic anatomy proximally to distally, including inguinal, deep abdominal, and/or axillary watersheds.   PLAN FOR NEXT SESSION:  LLE MLD and simultaneous skin care using low ph castor oil  (short neck sequence bilaterally, diaphragmatic breathing, functional inguinal LN , and proximal to distal J strokes     to thigh, knee, leg and foot. Retrograde J strokes x 3 to return to terminus.) Multilayer compression bandaging to L leg using 1 each 8, 10 and 12 cm wide x 5 meters long short stretch wrap over single layer of rosidal foam and cotton stockinett using gradient techniques. Pt instructed to remove wraps and report symptoms to her doctor if she experiences acute pain in the limb, signs/symptoms of infection, or atypical sob.  Pt / family edu for LE self care: bandaging  Loel Dubonnet, MS, OTR/L, CLT-LANA 12/06/23 3:15 PM

## 2023-12-11 ENCOUNTER — Ambulatory Visit: Payer: HMO | Admitting: Occupational Therapy

## 2023-12-11 DIAGNOSIS — I890 Lymphedema, not elsewhere classified: Secondary | ICD-10-CM

## 2023-12-11 NOTE — Therapy (Signed)
OUTPATIENT OCCUPATIONAL THERAPY TREATMENT NOTE and PROGRESS REPORT  LOWER EXTREMITY LYMPHEDEMA Patient Name: [name] MRN: [id number] DOB:06-12-1956, 68 y.o., female Today's Date: 12/11/2023  REPORTING PERIOD: 09/13/23 - 12/11/23  END OF SESSION:   OT End of Session - 12/11/23 1414     Visit Number 10    Number of Visits 36    Date for OT Re-Evaluation 12/13/23    OT Start Time 0205    Activity Tolerance Patient tolerated treatment well;No increased pain    Behavior During Therapy WFL for tasks assessed/performed             Past Medical History:  Diagnosis Date   Allergy    Anemia    Anxiety    Arthritis    Depression    Fibromyalgia    HTN (hypertension)    MRSA (methicillin resistant Staphylococcus aureus)    Obese    Plantar fasciitis    Pneumonia    Seasonal allergies    Sleep apnea 2012   took test in [place]-told to use cpap-could not ude   Past Surgical History:  Procedure Laterality Date   ANTERIOR CERVICAL DECOMP/DISCECTOMY FUSION  05/09/2008   C5-6, C6-7; with arthrodesis also   GASTRIC BYPASS  10/24/2000   KNEE ARTHROSCOPY  10/24/2002   rt and lt knee   ROTATOR CUFF REPAIR W/ DISTAL CLAVICLE EXCISION  01/06/2006   right   SHOULDER ARTHROSCOPY Left 10/24/2001   TONSILLECTOMY     TOTAL KNEE ARTHROPLASTY  03/29/2004   left   TOTAL KNEE ARTHROPLASTY  11/07/2003   right   TOTAL KNEE REVISION  07/30/2010   right; with lateral release   UPPER GASTROINTESTINAL ENDOSCOPY     Patient Active Problem List   Diagnosis Date Noted   Essential hypertension 02/14/2022   Pneumonia 06/10/2017   CAP (community acquired pneumonia) 06/09/2017   AKI (acute kidney injury) (HCC) 06/09/2017   ADHD, 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   Insomnia    Cellulitis and abscess of leg 06/19/2016   Pyoderma 02/11/2016   Cellulitis of left lower extremity 01/09/2016   Psoriatic arthritis (HCC)    Cough    Wheezing    Chronic anemia 12/05/2015   Chronic  venous insufficiency 12/04/2015   Hoarding disorder 03/05/2015   Alcohol abuse 11/21/2014   Anxiety state 11/21/2014   Morbid obesity with BMI of 40.0-44.9, adult (HCC) 11/21/2014   Epistaxis 09/24/2014   Bruising 05/03/2014   Hematemesis 03/07/2014   Depressive disorder 12/09/2013   GAD (generalized anxiety disorder) 09/17/2013   Fibromyalgia 07/22/2013    PCP: Adela Glimpse, NP  REFERRING PROVIDER: Lucia Estelle, DPM  REFERRING DIAG: I89.0  THERAPY DIAG:  Lymphedema, not elsewhere classified  Rationale for Evaluation and Treatment: Rehabilitation  ONSET DATE: "I've had leg swelling for years. I don't know when it started. " + family hx of leg swelling-mother  SUBJECTIVE:  SUBJECTIVE STATEMENT: [name] returns to OT to commence CDT to RLE. She is accompanied by her spouse, [name], and her ward.  Pt has no new complaints.  She denies LE-related leg pain. She is disappointed that leg swelling has not reduced more, and she expresses concern re seemingly worsening darkening of hemosiderin skin stain.  PERTINENT HISTORY: Pls see active problem list above  PAIN:  Are you having LE related pain? No: NPRS scale: 0/10 Pain location: feet Pain description: burning Aggravating factors: walking Relieving factors: elevation  PRECAUTIONS: Other: LYMPHEDEMA PRECAUTIONS  WEIGHT BEARING RESTRICTIONS: No  FALLS:  Has patient fallen in last 6 months? 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   OBJECTIVE: Note: Objective measures were completed at Evaluation unless otherwise noted.  COGNITION:  Overall cognitive status: frequent redirection needed during eval to stay on topic    POSTURE: WFL  LE ROM: grossly WFL  STRENGTH: grossly WFL   BLE COMPARATIVE LIMB VOLUMETRICS INITIAL 11/01/23  LANDMARK RIGHT  (dominant)  R LEG (A-D) 4296.6 ml  R THIGH (E-G) ml  R FULL LIMB (A-G) ml  Limb Volume differential (LVD)  %  Volume change since initial %  Volume change overall V  (Blank rows = not tested)  LANDMARK LEFT    L LEG (A-D) 4829.1 ml  L  THIGH (E-G) ml  L FULL LIMB (A-G) ml  Limb Volume differential (LVD)   12.4%, L>R  Volume change since initial %  Volume change overall %  (Blank rows = not tested)    9th Visit 12/06/23        LANDMARK LEFT    L LEG (A-D) 4888.8 ml  L  THIGH (E-G) ml  L FULL LIMB (A-G) ml  Limb Volume differential (LVD)     Volume change since initial  + 1.2 %  Volume change overall %  (Blank rows = not tested)    Mild, Stage  II, 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 Obesity  Skin  Description Hyper-Keratosis Peau' de Orange Shiny Tight Fibrotic/ Indurated Fatty Doughy Spongy/ boggy   x    Bilateral Chronic Lipo dermatosclerosis Hardened, thickened, leathery      Skin dry Flaky WNL Macerated   mildly x     Color Redness Varicosities Blanching Hemosiderin Stain Mottled   x x x  Severe bilaterally x   Odor Malodorous Yeast Fungal infection  WNL      x   Temperature Warm Cool wnl    x     Pitting Edema   1+ 2+ 3+ 4+ Non-pitting         x   Girth Symmetrical Asymmetrical                   Distribution    L>R toes to popliteal     Stemmer Sign Positive Negative   +    Lymphorrhea History  Of:  Present Absent     x    Wounds History Of Present Absent Venous Arterial  Pressure Sheer     x        Signs of Infection Redness Warmth Erythema Acute Swelling Drainage Borders                    Sensation Light Touch Deep pressure Hypersensitivity Neuropathic pain   In Tact Impaired In tact Impaired Absent Impaired Plantar surfaces bilaterally   x  x  x      Nails WNL   Fungus nail dystrophy     x   Hair Growth Symmetrical Asymmetrical   x    Skin Creases Base of toes  Ankles   Base of Fingers knees       Abdominal pannus Thigh Lobules  Face/neck   x           TODAY'S TREATMENT:                                                                                                                                         PT/ family edu re progress towards OT goals for LE care thus far and Pt edu for LE self care LLE/LLQ MLD LLE Compression wraps to as established.  PATIENT EDUCATION:  Continued Pt/ CG edu for lymphedema self care home program throughout session. Topics include outcome of comparative limb volumetrics- starting limb volume differentials (LVDs), technology and gradient techniques used for short stretch, multilayer compression wrapping, simple self-MLD, therapeutic lymphatic pumping exercises, skin/nail care, LE precautions,. compression garment recommendations and specifications, wear and care schedule and compression garment donning / doffing w assistive devices. Discussed progress towards all OT goals since commencing CDT. All questions answered to the Pt's satisfaction. Good return. Person educated: Patient  Education method: Explanation, Demonstration, and Handouts Education comprehension: verbalized understanding, returned demonstration, verbal cues required, and needs further education  HOME EXERCISE PROGRAM: BLE lymphatic pumping there ex using- 1 sets of 10 reps, each exercise in order-  1-2 x daily, bilaterally Simple self MLD 1 x daily Daily skin  care to increase hydration, skin mobility and decrease infection risk- can be done during MLD Compression wraps 23/7 until garment fitting complete   ASSESSMENT  CLINICAL IMPRESSION: To date comparative limb volumetrics reveal the L leg is increased in volume by 1.23% since commencing OT for CDT on 11/01/23. This is somewhat surprising as the leg appears smaller and is palpably less dense in upper calf area. Skin condition continues to improve.  Pt states she is diligent with compression between visits, but she rarely elevates her legs when seated as directed. Pt directed to elevate to the level of her heart multiple times per day, and to start practicing simple self MLD and ther ex daily. Diligent wrapping is great , but the full protocol of CDT gives the best outcomes.Continued MLD to LLE with simultaneous skin care for remaining times. Pt tolerated reapplication of compression wraps without increased pain. Overall Pt demonstrates  slow, but steady progress towards all goals.Cont as per POC.   (Initial Eval 09/13/23: Lavanna Rog is a 68 y.o. 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 Pt denies hx of known, chronic limb swelling in her family. She does not utilize compression garments, or a vasopneumatic compression "pump" as conservative measures. Chronic limb swelling and associated pain limits functional performance in all occupational domains, including functional ambulation and mobility, self-care and basic and instrumental ADLs. It limits participation in leisure pursuits, productive activities and social participle[ation.   Pt will benefit from  skilled Occupational Therapy for both  Intensive and Self -management phases of Complete Decongestive  Therapy (CDT) to reduce limb swelling and associated pain, to limit progression, to reduce infection risk , and to increase functional performance, social participation and quality of life. CDT include manual lymphatic drainage (MLD), skin care, therapeutic exercise and compression therapies- multilayer bandaging initially, one limb at a time,  and then appropriate compression garments, or alternatives in the final phase. Pt and caregivers who assist are trained to perform a;; aspects of Lymphedema self-care top ensure optimal self management over time. Without skilled  OT for CDT, further progression of lymphedema is certain and further functional decline is expected.)   OBJECTIVE IMPAIRMENTS: decreased activity tolerance, decreased knowledge of condition, decreased knowledge of use of DME, increased edema, pain, and chronic leg swelling .   ACTIVITY LIMITATIONS: limited functional ambulation and functional mobility 2/2 lymphedema -related foot and leg pain; standing, squatting, stairs, bathing, toileting, dressing, and hygiene/grooming  PARTICIPATION LIMITATIONS: meal prep, cooking, cleaning, driving, shopping, community activity, and volunteering  PERSONAL FACTORS: Time since onset of injury/illness/exacerbation are also affecting patient's functional outcome.   REHAB POTENTIAL: Good  CLINICAL DECISION MAKING: Evolving/moderate complexity  EVALUATION COMPLEXITY: Moderate   GOALS: Goals reviewed with patient? Yes  SHORT TERM GOALS: Target date: 4th OT Rx visit   Pt will demonstrate understanding of lymphedema precautions and prevention strategies with modified independence using a printed reference to identify at least 5 precautions and discussing how s/he may implement them into daily life to reduce risk of progression with extra time. Baseline:Max A Goal status: 11/20/23 GOAL MET  2.  Pt will be able to apply multilayer, knee length, gradient, compression wraps to one leg at a time with  max caregiver assistance to decrease limb volume, to limit infection risk, and to limit lymphedema progression.  Baseline: Dependent Goal status: 11/13/23 GOAL MET  LONG TERM GOALS: Target date: 12/12/22  Given this patient's Intake score of TBD % on the functional outcomes FOTO tool, patient will experience an increase in function of 5 points to improve basic and instrumental ADLs performance, including lymphedema self-care.  Baseline: Max A Goal status: GOAL DEFERRED. FOTO tool discontinued.  2.  Given this patient's Intake score of 64.71 on the Lymphedema Life Impact Scale (LLIS), patient will experience a reduction of at least 5 points in her perceived level of functional impairment resulting from lymphedema to improve functional performance and quality of life (QOL). Baseline: 64.71% Goal status: PROGRESSING  3.  Pt will achieve at least a 10% volume reduction in B legs to return limb to typical size and shape, to limit infection risk and LE progression, to decrease pain, to improve function. Baseline: Dependent Goal status: PROGRESSING  4.  Pt will obtain proper compression garments/devices and achieve modified independence (extra time + assistive devices) with donning/doffing to optimize limb volume reductions and limit LE progression over time. Baseline:  Goal status: ONGOING PLAN:  OT FREQUENCY: 2x/week  OT DURATION: 12 weeks  PLANNED INTERVENTIONS: 97110-Therapeutic exercises, 97530- Therapeutic activity, 97535- Self Care, 29562- Manual therapy, Patient/Family education, Manual lymph drainage, Compression bandaging, DME instructions, and fitting with compression garments  Custom-made gradient compression garments and HOS devices are medically necessary because they are uniquely sized and shaped to fit the exact dimensions of the affected extremities, and to provide appropriate medical grade, graduated compression essential for optimally managing chronic, progressive lymphedema.  Multiple custom compression garments are needed to ensure proper hygiene to limit infection risk. Custom compression garments should be replaced q 3-6 months When worn consistently for optimal lipo-lymphedema self-management over time. HOS devices, medically necessary to limit fibrosis buildup in tissue, should be replaced q 2 years and PRN when worn out.     Consider replacing basic , vasopneumatic compression "pump" with an advanced pneumatic, sequential, compression device, such as the Flexitouch, made by [employer]. The Flexitouch is medically necessary to facilitate improved lymphatic drainage by following specific lymphatic anatomy proximally to distally, including inguinal, deep abdominal, and/or axillary watersheds.   PLAN FOR NEXT SESSION:  LLE MLD and simultaneous skin care using low ph castor oil  (short neck sequence bilaterally, diaphragmatic breathing, functional inguinal LN , and proximal to distal J strokes     to thigh, knee, leg and foot. Retrograde J strokes x 3 to return to terminus.) Multilayer compression bandaging to L leg using 1 each 8, 10 and 12 cm wide x 5 meters long short stretch wrap over single layer of rosidal foam and cotton stockinett using gradient techniques. Pt instructed to remove wraps and report symptoms to her doctor if she experiences acute pain in the limb, signs/symptoms of infection, or atypical sob.  Pt / family edu for LE self care: bandaging  Loel Dubonnet, MS, OTR/L, CLT-LANA 12/11/23 2:17 PM

## 2023-12-13 ENCOUNTER — Ambulatory Visit: Payer: HMO | Admitting: Occupational Therapy

## 2023-12-14 ENCOUNTER — Other Ambulatory Visit (HOSPITAL_COMMUNITY): Payer: Self-pay

## 2023-12-18 ENCOUNTER — Ambulatory Visit: Payer: HMO | Admitting: Occupational Therapy

## 2023-12-18 ENCOUNTER — Encounter: Payer: Self-pay | Admitting: Nurse Practitioner

## 2023-12-18 ENCOUNTER — Telehealth: Payer: Self-pay | Admitting: Nurse Practitioner

## 2023-12-18 ENCOUNTER — Other Ambulatory Visit (HOSPITAL_COMMUNITY): Payer: Self-pay

## 2023-12-18 DIAGNOSIS — I890 Lymphedema, not elsewhere classified: Secondary | ICD-10-CM

## 2023-12-18 MED ORDER — MORPHINE SULFATE ER 30 MG PO TBCR
30.0000 mg | EXTENDED_RELEASE_TABLET | Freq: Two times a day (BID) | ORAL | 0 refills | Status: DC
  Filled 2023-12-22 – 2023-12-28 (×2): qty 90, 45d supply, fill #0

## 2023-12-18 NOTE — Telephone Encounter (Signed)
 Thanks for the response.   FYI: Adela Glimpse, NP was Dr. Myrene Galas PA. Since he is no longer at the practice, neither is she.  I notified the patient.

## 2023-12-18 NOTE — Telephone Encounter (Signed)
 Yes, please set up a OV when possible.  Thanks.

## 2023-12-18 NOTE — Telephone Encounter (Signed)
Error

## 2023-12-18 NOTE — Telephone Encounter (Signed)
 This was a Dr. Nicola Police patient that is requesting to see Dr. Milinda Antis  Her husband sees Dr. Para March, but she prefers a female  Please advise if you would be willing to accept this patient

## 2023-12-18 NOTE — Telephone Encounter (Signed)
 Patient is requesting if Dr. Para March could be her PCP, her husband, [name] is a patient of Dr. Para March.  She states she only needs PCP for routine things (colds, etc...), she has specialists for her other health issues  Please advise

## 2023-12-18 NOTE — Telephone Encounter (Signed)
 Per Dr. Milinda Antis not taking new pt's this age right now.  Also PCP says [facility], doesn't look like she's had an appt with Dr. Nicola Police before

## 2023-12-18 NOTE — Therapy (Signed)
 OUTPATIENT OCCUPATIONAL THERAPY TREATMENT NOTE  LOWER EXTREMITY LYMPHEDEMA Patient Name: [name] MRN: [id number] DOB:Sep 21, 1956, 68 y.o., female Today's Date: 12/18/2023  REPORTING PERIOD:   END OF SESSION:   OT End of Session - 12/18/23 1517     Visit Number 11    Number of Visits 36    Date for OT Re-Evaluation 12/13/23    OT Start Time 0205    OT Stop Time 0315    OT Time Calculation (min) 70 min    Activity Tolerance Patient tolerated treatment well;No increased pain    Behavior During Therapy WFL for tasks assessed/performed             Past Medical History:  Diagnosis Date   Allergy    Anemia    Anxiety    Arthritis    Depression    Fibromyalgia    HTN (hypertension)    MRSA (methicillin resistant Staphylococcus aureus)    Obese    Plantar fasciitis    Pneumonia    Seasonal allergies    Sleep apnea 2012   took test in [place]-told to use cpap-could not ude   Past Surgical History:  Procedure Laterality Date   ANTERIOR CERVICAL DECOMP/DISCECTOMY FUSION  05/09/2008   C5-6, C6-7; with arthrodesis also   GASTRIC BYPASS  10/24/2000   KNEE ARTHROSCOPY  10/24/2002   rt and lt knee   ROTATOR CUFF REPAIR W/ DISTAL CLAVICLE EXCISION  01/06/2006   right   SHOULDER ARTHROSCOPY Left 10/24/2001   TONSILLECTOMY     TOTAL KNEE ARTHROPLASTY  03/29/2004   left   TOTAL KNEE ARTHROPLASTY  11/07/2003   right   TOTAL KNEE REVISION  07/30/2010   right; with lateral release   UPPER GASTROINTESTINAL ENDOSCOPY     Patient Active Problem List   Diagnosis Date Noted   Essential hypertension 02/14/2022   Pneumonia 06/10/2017   CAP (community acquired pneumonia) 06/09/2017   AKI (acute kidney injury) (HCC) 06/09/2017   ADHD, 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   Insomnia    Cellulitis and abscess of leg 06/19/2016   Pyoderma 02/11/2016   Cellulitis of left lower extremity 01/09/2016   Psoriatic arthritis (HCC)    Cough    Wheezing    Chronic anemia  12/05/2015   Chronic venous insufficiency 12/04/2015   Hoarding disorder 03/05/2015   Alcohol abuse 11/21/2014   Anxiety state 11/21/2014   Morbid obesity with BMI of 40.0-44.9, adult (HCC) 11/21/2014   Epistaxis 09/24/2014   Bruising 05/03/2014   Hematemesis 03/07/2014   Depressive disorder 12/09/2013   GAD (generalized anxiety disorder) 09/17/2013   Fibromyalgia 07/22/2013    PCP: Adela Glimpse, NP  REFERRING PROVIDER: Lucia Estelle, DPM  REFERRING DIAG: I89.0  THERAPY DIAG:  Lymphedema, not elsewhere classified  Rationale for Evaluation and Treatment: Rehabilitation  ONSET DATE: "I've had leg swelling for years. I don't know when it started. " + family hx of leg swelling-mother  SUBJECTIVE:  SUBJECTIVE STATEMENT: [name] returns to OT to commence CDT to RLE. She is accompanied by her spouse, [name], and her ward.  Pt has no new complaints.  She denies LE-related leg pain. She is disappointed that leg swelling has not reduced more, and she expresses concern re seemingly worsening darkening of hemosiderin skin stain.  PERTINENT HISTORY: Pls see active problem list above  PAIN:  Are you having LE related pain? No: NPRS scale: 0/10 Pain location: feet Pain description: burning Aggravating factors: walking Relieving factors: elevation  PRECAUTIONS: Other: LYMPHEDEMA PRECAUTIONS  WEIGHT BEARING RESTRICTIONS: No  FALLS:  Has patient fallen in last 6 months? 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   OBJECTIVE: Note: Objective measures were completed at Evaluation unless otherwise noted.  COGNITION:  Overall cognitive status: frequent redirection needed during eval to stay on topic    POSTURE: WFL  LE ROM: grossly WFL  STRENGTH: grossly WFL   BLE COMPARATIVE LIMB VOLUMETRICS INITIAL 11/01/23  LANDMARK RIGHT  (dominant)  R LEG (A-D) 4296.6 ml  R THIGH (E-G) ml  R FULL LIMB (A-G) ml  Limb Volume differential (LVD)  %  Volume change since initial %  Volume change overall V  (Blank rows = not tested)  LANDMARK LEFT    L LEG (A-D) 4829.1 ml  L  THIGH (E-G) ml  L FULL LIMB (A-G) ml  Limb Volume differential (LVD)   12.4%, L>R  Volume change since initial %  Volume change overall %  (Blank rows = not tested)    9th Visit 12/06/23        LANDMARK LEFT    L LEG (A-D) 4888.8 ml  L  THIGH (E-G) ml  L FULL LIMB (A-G) ml  Limb Volume differential (LVD)     Volume change since initial  + 1.2 %  Volume change overall %  (Blank rows = not tested)    Mild, Stage  II, 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 Obesity  Skin  Description Hyper-Keratosis Peau' de Orange Shiny Tight Fibrotic/ Indurated Fatty Doughy Spongy/ boggy   x    Bilateral Chronic Lipo dermatosclerosis Hardened, thickened, leathery      Skin dry Flaky WNL Macerated   mildly x     Color Redness Varicosities Blanching Hemosiderin Stain Mottled   x x x  Severe bilaterally x   Odor Malodorous Yeast Fungal infection  WNL      x   Temperature Warm Cool wnl    x     Pitting Edema   1+ 2+ 3+ 4+ Non-pitting         x   Girth Symmetrical Asymmetrical                   Distribution    L>R toes to popliteal     Stemmer Sign Positive Negative   +     Lymphorrhea History Of:  Present Absent     x    Wounds History Of Present Absent Venous Arterial  Pressure Sheer     x        Signs of Infection Redness Warmth Erythema Acute Swelling Drainage Borders                    Sensation Light Touch Deep pressure Hypersensitivity Neuropathic pain   In Tact Impaired In tact Impaired Absent Impaired Plantar surfaces bilaterally   x  x  x      Nails WNL   Fungus nail dystrophy     x   Hair Growth Symmetrical Asymmetrical   x    Skin Creases Base of toes  Ankles   Base of Fingers knees       Abdominal pannus Thigh Lobules  Face/neck   x           TODAY'S TREATMENT:                                                                                                                                         PT/ family edu re progress towards OT goals for LE care thus far and Pt edu for LE self care LLE/LLQ MLD LLE Compression wraps to as established.  PATIENT EDUCATION:  Continued Pt/ CG edu for lymphedema self care home program throughout session. Topics include outcome of comparative limb volumetrics- starting limb volume differentials (LVDs), technology and gradient techniques used for short stretch, multilayer compression wrapping, simple self-MLD, therapeutic lymphatic pumping exercises, skin/nail care, LE precautions,. compression garment recommendations and specifications, wear and care schedule and compression garment donning / doffing w assistive devices. Discussed progress towards all OT goals since commencing CDT. All questions answered to the Pt's satisfaction. Good return. Person educated: Patient  Education method: Explanation, Demonstration, and Handouts Education comprehension: verbalized understanding, returned demonstration, verbal cues required, and needs further education  HOME EXERCISE PROGRAM: BLE lymphatic pumping there ex using- 1 sets of 10 reps, each exercise in order-  1-2 x daily, bilaterally Simple self MLD  1 x daily Daily skin care to increase hydration, skin mobility and decrease infection risk- can be done during MLD Compression wraps 23/7 until garment fitting complete   ASSESSMENT  CLINICAL IMPRESSION: Lymphedema is very well managed today upon removing wraps. Skin condition continues to improve evidenced by increase excursion and improved hydration. Continued MLD to LLE with simultaneous skin care for remaining times. Pt tolerated reapplication of compression wraps without increased pain. Throughout session integrated Pt and family edu for simple self MLD and lymphatic pumping therex using demonstration and verbal cuing.Pt declined practicing ther ex in clinic. Reviewed simple self MLD, but will save demonstration and practice for next session when ewe have more time. Cont as per POC.   (Initial Eval 09/13/23: Suleima Ohlendorf is a 68 y.o. 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 Pt denies hx of known, chronic limb swelling in her family. She does not utilize compression garments, or a vasopneumatic compression "pump" as conservative measures. Chronic limb swelling and associated pain limits functional performance in all occupational domains, including functional ambulation and mobility, self-care and basic and instrumental ADLs. It limits participation in leisure pursuits, productive activities and social participle[ation.   Pt will benefit from  skilled Occupational Therapy for both  Intensive and Self -management phases of Complete Decongestive Therapy (CDT) to reduce limb swelling and associated pain, to limit progression, to reduce infection risk , and to increase functional performance, social participation and quality of life. CDT include manual  lymphatic drainage (MLD), skin care, therapeutic exercise and compression therapies- multilayer bandaging initially, one limb at a time,  and then appropriate compression garments, or alternatives in the final phase. Pt and caregivers who assist are trained to perform a;; aspects of Lymphedema self-care top ensure optimal self management over time. Without skilled  OT for CDT, further progression of lymphedema is certain and further functional decline is expected.)   OBJECTIVE IMPAIRMENTS: decreased activity tolerance, decreased knowledge of condition, decreased knowledge of use of DME, increased edema, pain, and chronic leg swelling .   ACTIVITY LIMITATIONS: limited functional ambulation and functional mobility 2/2 lymphedema -related foot and leg pain; standing, squatting, stairs, bathing, toileting, dressing, and hygiene/grooming  PARTICIPATION LIMITATIONS: meal prep, cooking, cleaning, driving, shopping, community activity, and volunteering  PERSONAL FACTORS: Time since onset of injury/illness/exacerbation are also affecting patient's functional outcome.   REHAB POTENTIAL: Good  CLINICAL DECISION MAKING: Evolving/moderate complexity  EVALUATION COMPLEXITY: Moderate   GOALS: Goals reviewed with patient? Yes  SHORT TERM GOALS: Target date: 4th OT Rx visit   Pt will demonstrate understanding of lymphedema precautions and prevention strategies with modified independence using a printed reference to identify at least 5 precautions and discussing how s/he may implement them into daily life to reduce risk of progression with extra time. Baseline:Max A Goal status: 11/20/23 GOAL MET  2.  Pt will be able to apply multilayer, knee length, gradient, compression wraps to one leg at a time with max caregiver assistance to decrease limb volume, to limit infection risk, and to limit lymphedema progression.  Baseline: Dependent Goal status: 11/13/23 GOAL MET  LONG TERM GOALS: Target date:  12/12/22  Given this patient's Intake score of TBD % on the functional outcomes FOTO tool, patient will experience an increase in function of 5 points to improve basic and instrumental ADLs performance, including lymphedema self-care.  Baseline: Max A Goal status: GOAL DEFERRED. FOTO tool discontinued.  2.  Given this patient's Intake score of 64.71 on the Lymphedema Life Impact Scale (LLIS), patient will experience a reduction of at least 5 points in her perceived level of functional impairment resulting from lymphedema to improve functional performance and quality of life (QOL). Baseline: 64.71% Goal status: PROGRESSING  3.  Pt will achieve at least a 10% volume reduction in B legs to return limb to typical size and shape, to limit infection risk and LE progression, to decrease pain, to improve function. Baseline: Dependent Goal status: PROGRESSING  4.  Pt will obtain proper compression garments/devices and achieve modified independence (extra time + assistive devices) with donning/doffing to optimize limb volume reductions and limit LE progression over time. Baseline:  Goal status: ONGOING PLAN:  OT FREQUENCY: 2x/week  OT DURATION: 12 weeks  PLANNED INTERVENTIONS: 97110-Therapeutic exercises,  69629- Therapeutic activity, 97535- Self Care, 52841- Manual therapy, Patient/Family education, Manual lymph drainage, Compression bandaging, DME instructions, and fitting with compression garments  Custom-made gradient compression garments and HOS devices are medically necessary because they are uniquely sized and shaped to fit the exact dimensions of the affected extremities, and to provide appropriate medical grade, graduated compression essential for optimally managing chronic, progressive lymphedema. Multiple custom compression garments are needed to ensure proper hygiene to limit infection risk. Custom compression garments should be replaced q 3-6 months When worn consistently for optimal  lipo-lymphedema self-management over time. HOS devices, medically necessary to limit fibrosis buildup in tissue, should be replaced q 2 years and PRN when worn out.     Pt should be fit:  3 Left and 3 Right, custom, Jobst, flat knit, Elvarex Classic, ccl 2 or 3, knee length compression stockings with open toes to limit and control chronic leg swelling, to improve lymphatic circulation, to reduce infection risk, to limit formation of tissue fibrosis and to limit progression of lymphedema over time. Custom compression garments should be replaced q 3-6 months when worn consistently for optimal lymphedema self-management.   Pt should be fit with 1 R and 1 L custom, Jobst Relax convoluted Hours-of-Sleep (HOS)  compression device to counteract fluid accumulation and fibrosis formation leading to lymphedema progression. at night. HOS devices, medically necessary to limit fibrosis buildup in tissue, should be replaced q 2 years and PRN when worn out.    PLAN FOR NEXT SESSION:  LLE MLD as established using functional inguinal LN and deep abdominal lymphatics. Multilayer compression bandaging to L leg using 1 each 8, 10 and 12 cm wide x 5 meters long short stretch wrap over single layer of rosidal foam and cotton stockinett using gradient techniques. Pt instructed to remove wraps and report symptoms to her doctor if she experiences acute pain in the limb, signs/symptoms of infection, or atypical sob.  Pt / family edu for LE self care: simple self MLD leg sequence  Loel Dubonnet, MS, OTR/L, CLT-LANA 12/18/23 3:23 PM

## 2023-12-19 ENCOUNTER — Ambulatory Visit: Payer: HMO | Admitting: Family Medicine

## 2023-12-19 NOTE — Telephone Encounter (Signed)
 Called pt. No answer and Unable to leave voicemail.

## 2023-12-20 ENCOUNTER — Ambulatory Visit: Payer: HMO | Admitting: Occupational Therapy

## 2023-12-20 NOTE — Telephone Encounter (Signed)
 Called patient to schedule appointment unable to leave a voicemail due to mailbox full.

## 2023-12-21 ENCOUNTER — Encounter: Payer: Self-pay | Admitting: Occupational Therapy

## 2023-12-21 ENCOUNTER — Ambulatory Visit: Payer: HMO | Admitting: Occupational Therapy

## 2023-12-21 DIAGNOSIS — I890 Lymphedema, not elsewhere classified: Secondary | ICD-10-CM

## 2023-12-21 NOTE — Therapy (Unsigned)
 OUTPATIENT OCCUPATIONAL THERAPY TREATMENT NOTE  LOWER EXTREMITY LYMPHEDEMA Patient Name: [name] MRN: [id number] DOB:Jun 08, 1956, 68 y.o., female Today's Date: 12/22/2023  REPORTING PERIOD:   END OF SESSION:   OT End of Session - 12/21/23 1317     Visit Number 12    Number of Visits 36    Date for OT Re-Evaluation 03/20/24    OT Start Time 0105    OT Stop Time 0212    OT Time Calculation (min) 67 min    Activity Tolerance Patient tolerated treatment well;No increased pain    Behavior During Therapy WFL for tasks assessed/performed             Past Medical History:  Diagnosis Date   Allergy    Anemia    Anxiety    Arthritis    Depression    Fibromyalgia    HTN (hypertension)    MRSA (methicillin resistant Staphylococcus aureus)    Obese    Plantar fasciitis    Pneumonia    Seasonal allergies    Sleep apnea 2012   took test in [place]-told to use cpap-could not ude   Past Surgical History:  Procedure Laterality Date   ANTERIOR CERVICAL DECOMP/DISCECTOMY FUSION  05/09/2008   C5-6, C6-7; with arthrodesis also   GASTRIC BYPASS  10/24/2000   KNEE ARTHROSCOPY  10/24/2002   rt and lt knee   ROTATOR CUFF REPAIR W/ DISTAL CLAVICLE EXCISION  01/06/2006   right   SHOULDER ARTHROSCOPY Left 10/24/2001   TONSILLECTOMY     TOTAL KNEE ARTHROPLASTY  03/29/2004   left   TOTAL KNEE ARTHROPLASTY  11/07/2003   right   TOTAL KNEE REVISION  07/30/2010   right; with lateral release   UPPER GASTROINTESTINAL ENDOSCOPY     Patient Active Problem List   Diagnosis Date Noted   Essential hypertension 02/14/2022   Pneumonia 06/10/2017   CAP (community acquired pneumonia) 06/09/2017   AKI (acute kidney injury) (HCC) 06/09/2017   ADHD, 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   Insomnia    Cellulitis and abscess of leg 06/19/2016   Pyoderma 02/11/2016   Cellulitis of left lower extremity 01/09/2016   Psoriatic arthritis (HCC)    Cough    Wheezing    Chronic anemia  12/05/2015   Chronic venous insufficiency 12/04/2015   Hoarding disorder 03/05/2015   Alcohol abuse 11/21/2014   Anxiety state 11/21/2014   Morbid obesity with BMI of 40.0-44.9, adult (HCC) 11/21/2014   Epistaxis 09/24/2014   Bruising 05/03/2014   Hematemesis 03/07/2014   Depressive disorder 12/09/2013   GAD (generalized anxiety disorder) 09/17/2013   Fibromyalgia 07/22/2013    PCP: Adela Glimpse, NP  REFERRING PROVIDER: Lucia Estelle, DPM  REFERRING DIAG: I89.0  THERAPY DIAG:  Lymphedema, not elsewhere classified  Rationale for Evaluation and Treatment: Rehabilitation  ONSET DATE: "I've had leg swelling for years. I don't know when it started. " + family hx of leg swelling-mother  SUBJECTIVE:  SUBJECTIVE STATEMENT: CALLEE ROHRIG returns to OT to commence CDT to RLE. She is accompanied by her spouse, [name], and her ward.  Pt has no new complaints.  She denies LE-related leg pain. She is disappointed that leg swelling has not reduced more, and she expresses concern re seemingly worsening darkening of hemosiderin skin stain.  PERTINENT HISTORY: Pls see active problem list above  PAIN:  Are you having LE related pain? No: NPRS scale: 0/10 Pain location: feet Pain description: burning Aggravating factors: walking Relieving factors: elevation  PRECAUTIONS: Other: LYMPHEDEMA PRECAUTIONS  WEIGHT BEARING RESTRICTIONS: No  FALLS:  Has patient fallen in last 6 months? 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   OBJECTIVE: Note: Objective measures were completed at Evaluation unless otherwise noted.  COGNITION:  Overall cognitive status: frequent redirection needed during eval to stay on topic    POSTURE: WFL  LE ROM: grossly WFL  STRENGTH: grossly WFL   BLE COMPARATIVE LIMB VOLUMETRICS INITIAL 11/01/23  LANDMARK RIGHT  (dominant)  R LEG (A-D) 4296.6 ml  R THIGH (E-G) ml  R FULL LIMB (A-G) ml  Limb Volume differential (LVD)  %  Volume change since initial %  Volume change overall V  (Blank rows = not tested)  LANDMARK LEFT    L LEG (A-D) 4829.1 ml  L  THIGH (E-G) ml  L FULL LIMB (A-G) ml  Limb Volume differential (LVD)   12.4%, L>R  Volume change since initial %  Volume change overall %  (Blank rows = not tested)    9th Visit 12/06/23        LANDMARK LEFT    L LEG (A-D) 4888.8 ml  L  THIGH (E-G) ml  L FULL LIMB (A-G) ml  Limb Volume differential (LVD)     Volume change since initial  + 1.2 %  Volume change overall %  (Blank rows = not tested)    Mild, Stage  II, 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 Obesity  Skin  Description Hyper-Keratosis Peau' de Orange Shiny Tight Fibrotic/ Indurated Fatty Doughy Spongy/ boggy   x    Bilateral Chronic Lipo dermatosclerosis Hardened, thickened, leathery      Skin dry Flaky WNL Macerated   mildly x     Color Redness Varicosities Blanching Hemosiderin Stain Mottled   x x x  Severe bilaterally x   Odor Malodorous Yeast Fungal infection  WNL      x   Temperature Warm Cool wnl    x     Pitting Edema   1+ 2+ 3+ 4+ Non-pitting         x   Girth Symmetrical Asymmetrical                   Distribution    L>R toes to popliteal     Stemmer Sign Positive Negative   +     Lymphorrhea History Of:  Present Absent     x    Wounds History Of Present Absent Venous Arterial  Pressure Sheer     x        Signs of Infection Redness Warmth Erythema Acute Swelling Drainage Borders                    Sensation Light Touch Deep pressure Hypersensitivity Neuropathic pain   In Tact Impaired In tact Impaired Absent Impaired Plantar surfaces bilaterally   x  x  x      Nails WNL   Fungus nail dystrophy     x   Hair Growth Symmetrical Asymmetrical   x    Skin Creases Base of toes  Ankles   Base of Fingers knees       Abdominal pannus Thigh Lobules  Face/neck   x           TODAY'S TREATMENT:                                                                                                                                         PT/ family edu re progress towards OT goals for LE care thus far and Pt edu for LE self care LLE/LLQ MLD LLE Compression wraps to as established.  PATIENT EDUCATION:  Continued Pt/ CG edu for lymphedema self care home program throughout session. Topics include outcome of comparative limb volumetrics- starting limb volume differentials (LVDs), technology and gradient techniques used for short stretch, multilayer compression wrapping, simple self-MLD, therapeutic lymphatic pumping exercises, skin/nail care, LE precautions,. compression garment recommendations and specifications, wear and care schedule and compression garment donning / doffing w assistive devices. Discussed progress towards all OT goals since commencing CDT. All questions answered to the Pt's satisfaction. Good return. Person educated: Patient  Education method: Explanation, Demonstration, and Handouts Education comprehension: verbalized understanding, returned demonstration, verbal cues required, and needs further education  HOME EXERCISE PROGRAM: BLE lymphatic pumping there ex using- 1 sets of 10 reps, each exercise in order-  1-2 x daily, bilaterally Simple self MLD  1 x daily Daily skin care to increase hydration, skin mobility and decrease infection risk- can be done during MLD Compression wraps 23/7 until garment fitting complete   ASSESSMENT  CLINICAL IMPRESSION: Lymphedema is very well managed today upon removing wraps. Skin condition continues to improve evidenced by increase excursion and improved hydration. Continued MLD to LLE with simultaneous skin care for remaining times. Pt tolerated reapplication of compression wraps without increased pain. Throughout session integrated Pt and family edu for simple self MLD and lymphatic pumping therex using demonstration and verbal cuing.Reviewed simple self MLD while performing manual therapy. Cont as per POC.   (Initial Eval 09/13/23: Shamikia Linskey is a 68 y.o. 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 Pt denies  hx of known, chronic limb swelling in her family. She does not utilize compression garments, or a vasopneumatic compression "pump" as conservative measures. Chronic limb swelling and associated pain limits functional performance in all occupational domains, including functional ambulation and mobility, self-care and basic and instrumental ADLs. It limits participation in leisure pursuits, productive activities and social participle[ation.   Pt will benefit from  skilled Occupational Therapy for both  Intensive and Self -management phases of Complete Decongestive Therapy (CDT) to reduce limb swelling and associated pain, to limit progression, to reduce infection risk , and to increase functional performance, social participation and quality of life. CDT include manual lymphatic drainage (MLD), skin care, therapeutic exercise and compression therapies- multilayer  bandaging initially, one limb at a time,  and then appropriate compression garments, or alternatives in the final phase. Pt and caregivers who assist are trained to perform a;; aspects of Lymphedema self-care top ensure optimal self management over time. Without skilled  OT for CDT, further progression of lymphedema is certain and further functional decline is expected.)   OBJECTIVE IMPAIRMENTS: decreased activity tolerance, decreased knowledge of condition, decreased knowledge of use of DME, increased edema, pain, and chronic leg swelling .   ACTIVITY LIMITATIONS: limited functional ambulation and functional mobility 2/2 lymphedema -related foot and leg pain; standing, squatting, stairs, bathing, toileting, dressing, and hygiene/grooming  PARTICIPATION LIMITATIONS: meal prep, cooking, cleaning, driving, shopping, community activity, and volunteering  PERSONAL FACTORS: Time since onset of injury/illness/exacerbation are also affecting patient's functional outcome.   REHAB POTENTIAL: Good  CLINICAL DECISION MAKING: Evolving/moderate complexity  EVALUATION COMPLEXITY: Moderate   GOALS: Goals reviewed with patient? Yes  SHORT TERM GOALS: Target date: 4th OT Rx visit   Pt will demonstrate understanding of lymphedema precautions and prevention strategies with modified independence using a printed reference to identify at least 5 precautions and discussing how s/he may implement them into daily life to reduce risk of progression with extra time. Baseline:Max A Goal status: 11/20/23 GOAL MET  2.  Pt will be able to apply multilayer, knee length, gradient, compression wraps to one leg at a time with max caregiver assistance to decrease limb volume, to limit infection risk, and to limit lymphedema progression.  Baseline: Dependent Goal status: 11/13/23 GOAL MET  LONG TERM GOALS: Target date: 12/12/22  Given this patient's Intake score of TBD % on the functional outcomes FOTO tool, patient will  experience an increase in function of 5 points to improve basic and instrumental ADLs performance, including lymphedema self-care.  Baseline: Max A Goal status: GOAL DEFERRED. FOTO tool discontinued.  2.  Given this patient's Intake score of 64.71 on the Lymphedema Life Impact Scale (LLIS), patient will experience a reduction of at least 5 points in her perceived level of functional impairment resulting from lymphedema to improve functional performance and quality of life (QOL). Baseline: 64.71% Goal status: PROGRESSING  3.  Pt will achieve at least a 10% volume reduction in B legs to return limb to typical size and shape, to limit infection risk and LE progression, to decrease pain, to improve function. Baseline: Dependent Goal status: PROGRESSING  4.  Pt will obtain proper compression garments/devices and achieve modified independence (extra time + assistive devices) with donning/doffing to optimize limb volume reductions and limit LE progression over time. Baseline:  Goal status: ONGOING PLAN:  OT FREQUENCY: 2x/week  OT DURATION: 12 weeks  PLANNED INTERVENTIONS: 97110-Therapeutic exercises, 97530- Therapeutic activity, 97535- Self Care, 09811- Manual therapy, Patient/Family education, Manual lymph drainage, Compression bandaging, DME  instructions, and fitting with compression garments  Custom-made gradient compression garments and HOS devices are medically necessary because they are uniquely sized and shaped to fit the exact dimensions of the affected extremities, and to provide appropriate medical grade, graduated compression essential for optimally managing chronic, progressive lymphedema. Multiple custom compression garments are needed to ensure proper hygiene to limit infection risk. Custom compression garments should be replaced q 3-6 months When worn consistently for optimal lipo-lymphedema self-management over time. HOS devices, medically necessary to limit fibrosis buildup in  tissue, should be replaced q 2 years and PRN when worn out.     Pt should be fit:  3 Left and 3 Right, custom, Jobst, flat knit, Elvarex Classic, ccl 2 or 3, knee length compression stockings with open toes to limit and control chronic leg swelling, to improve lymphatic circulation, to reduce infection risk, to limit formation of tissue fibrosis and to limit progression of lymphedema over time. Custom compression garments should be replaced q 3-6 months when worn consistently for optimal lymphedema self-management.   Pt should be fit with 1 R and 1 L custom, Jobst Relax convoluted Hours-of-Sleep (HOS)  compression device to counteract fluid accumulation and fibrosis formation leading to lymphedema progression. at night. HOS devices, medically necessary to limit fibrosis buildup in tissue, should be replaced q 2 years and PRN when worn out.    PLAN FOR NEXT SESSION:  LLE MLD as established using functional inguinal LN and deep abdominal lymphatics. Multilayer compression bandaging to L leg using 1 each 8, 10 and 12 cm wide x 5 meters long short stretch wrap over single layer of rosidal foam and cotton stockinett using gradient techniques. Pt instructed to remove wraps and report symptoms to her doctor if she experiences acute pain in the limb, signs/symptoms of infection, or atypical sob.  Pt / family edu for LE self care: simple self MLD leg sequence  Loel Dubonnet, MS, OTR/L, CLT-LANA 12/22/23 12:26 PM

## 2023-12-22 ENCOUNTER — Other Ambulatory Visit (HOSPITAL_COMMUNITY): Payer: Self-pay

## 2023-12-22 ENCOUNTER — Encounter: Payer: Self-pay | Admitting: Occupational Therapy

## 2023-12-25 ENCOUNTER — Ambulatory Visit: Payer: HMO | Attending: Podiatry | Admitting: Occupational Therapy

## 2023-12-25 ENCOUNTER — Other Ambulatory Visit (HOSPITAL_COMMUNITY): Payer: Self-pay

## 2023-12-25 DIAGNOSIS — I890 Lymphedema, not elsewhere classified: Secondary | ICD-10-CM | POA: Insufficient documentation

## 2023-12-25 NOTE — Telephone Encounter (Signed)
 Looks like patient called office back and new pt appointment was set up with another provider in an acute visit type? Can we call patient back to change to Dr. Para March as requested as new patient.

## 2023-12-25 NOTE — Therapy (Signed)
 OUTPATIENT OCCUPATIONAL THERAPY TREATMENT NOTE  LOWER EXTREMITY LYMPHEDEMA Patient Name: [name] MRN: [id number] DOB:17-Oct-1956, 68 y.o., female Today's Date: 12/25/2023  REPORTING PERIOD:   END OF SESSION:   OT End of Session - 12/25/23 1408     Visit Number 13    Number of Visits 36    Date for OT Re-Evaluation 03/20/24    OT Start Time 0203    OT Stop Time 0303    OT Time Calculation (min) 60 min    Activity Tolerance Patient tolerated treatment well;No increased pain    Behavior During Therapy WFL for tasks assessed/performed             Past Medical History:  Diagnosis Date   Allergy    Anemia    Anxiety    Arthritis    Depression    Fibromyalgia    HTN (hypertension)    MRSA (methicillin resistant Staphylococcus aureus)    Obese    Plantar fasciitis    Pneumonia    Seasonal allergies    Sleep apnea 2012   took test in [place]-told to use cpap-could not ude   Past Surgical History:  Procedure Laterality Date   ANTERIOR CERVICAL DECOMP/DISCECTOMY FUSION  05/09/2008   C5-6, C6-7; with arthrodesis also   GASTRIC BYPASS  10/24/2000   KNEE ARTHROSCOPY  10/24/2002   rt and lt knee   ROTATOR CUFF REPAIR W/ DISTAL CLAVICLE EXCISION  01/06/2006   right   SHOULDER ARTHROSCOPY Left 10/24/2001   TONSILLECTOMY     TOTAL KNEE ARTHROPLASTY  03/29/2004   left   TOTAL KNEE ARTHROPLASTY  11/07/2003   right   TOTAL KNEE REVISION  07/30/2010   right; with lateral release   UPPER GASTROINTESTINAL ENDOSCOPY     Patient Active Problem List   Diagnosis Date Noted   Essential hypertension 02/14/2022   Pneumonia 06/10/2017   CAP (community acquired pneumonia) 06/09/2017   AKI (acute kidney injury) (HCC) 06/09/2017   ADHD, 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   Insomnia    Cellulitis and abscess of leg 06/19/2016   Pyoderma 02/11/2016   Cellulitis of left lower extremity 01/09/2016   Psoriatic arthritis (HCC)    Cough    Wheezing    Chronic anemia  12/05/2015   Chronic venous insufficiency 12/04/2015   Hoarding disorder 03/05/2015   Alcohol abuse 11/21/2014   Anxiety state 11/21/2014   Morbid obesity with BMI of 40.0-44.9, adult (HCC) 11/21/2014   Epistaxis 09/24/2014   Bruising 05/03/2014   Hematemesis 03/07/2014   Depressive disorder 12/09/2013   GAD (generalized anxiety disorder) 09/17/2013   Fibromyalgia 07/22/2013    PCP: Adela Glimpse, NP  REFERRING PROVIDER: Lucia Estelle, DPM  REFERRING DIAG: I89.0  THERAPY DIAG:  Lymphedema, not elsewhere classified  Rationale for Evaluation and Treatment: Rehabilitation  ONSET DATE: "I've had leg swelling for years. I don't know when it started. " + family hx of leg swelling-mother  SUBJECTIVE:  SUBJECTIVE STATEMENT: [name] returns to OT to commence CDT to RLE. She is accompanied by her spouse, [name]. Pt has no new complaints.  She denies LE-related leg pain. "I had to cancel Wednesday."  PERTINENT HISTORY: Pls see active problem list above  PAIN:  Are you having LE related pain? No: NPRS scale: 0/10 Pain location: feet Pain description: burning Aggravating factors: walking Relieving factors: elevation  PRECAUTIONS: Other: LYMPHEDEMA PRECAUTIONS  WEIGHT BEARING RESTRICTIONS: No  FALLS:  Has patient fallen in last 6 months? 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   OBJECTIVE: Note: Objective measures were completed at Evaluation unless otherwise noted.  COGNITION:  Overall cognitive status: frequent redirection needed during eval to stay on topic    POSTURE: WFL  LE ROM: grossly WFL  STRENGTH: grossly WFL   BLE COMPARATIVE LIMB VOLUMETRICS INITIAL 11/01/23  LANDMARK RIGHT  (dominant)  R LEG (A-D) 4296.6 ml  R THIGH (E-G) ml  R FULL LIMB (A-G) ml  Limb Volume differential (LVD)  %  Volume change since initial %  Volume change overall V  (Blank rows = not tested)  LANDMARK LEFT    L LEG (A-D) 4829.1 ml  L  THIGH (E-G) ml  L FULL LIMB (A-G) ml  Limb Volume differential (LVD)   12.4%, L>R  Volume change since initial %  Volume change overall %  (Blank rows = not tested)    9th Visit 12/06/23        LANDMARK LEFT    L LEG (A-D) 4888.8 ml  L  THIGH (E-G) ml  L FULL LIMB (A-G) ml  Limb Volume differential (LVD)     Volume change since initial  + 1.2 %  Volume change overall %  (Blank rows = not tested)    Mild, Stage  II, 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 Obesity  Skin  Description Hyper-Keratosis Peau' de Orange Shiny Tight Fibrotic/ Indurated Fatty Doughy Spongy/ boggy   x    Bilateral Chronic Lipo dermatosclerosis Hardened, thickened, leathery      Skin dry Flaky WNL Macerated   mildly x     Color Redness Varicosities Blanching Hemosiderin Stain Mottled   x x x  Severe bilaterally x   Odor Malodorous Yeast Fungal infection  WNL      x   Temperature Warm Cool wnl    x     Pitting Edema   1+ 2+ 3+ 4+ Non-pitting         x   Girth Symmetrical Asymmetrical                   Distribution    L>R toes to popliteal     Stemmer Sign Positive Negative   +    Lymphorrhea History Of:  Present Absent     x    Wounds History Of Present Absent Venous Arterial Pressure Sheer     x         Signs of Infection Redness Warmth Erythema Acute  Swelling Drainage Borders                    Sensation Light Touch Deep pressure Hypersensitivity Neuropathic pain   In Tact Impaired In tact Impaired Absent Impaired Plantar surfaces bilaterally   x  x  x      Nails WNL   Fungus nail dystrophy     x   Hair Growth Symmetrical Asymmetrical   x    Skin Creases Base of toes  Ankles   Base of Fingers knees       Abdominal pannus Thigh Lobules  Face/neck   x           TODAY'S TREATMENT:                                                                                                                                         PT/ family edu re progress towards OT goals for LE care thus far and Pt edu for LE self care LLE/LLQ MLD LLE Compression wraps to as established.  PATIENT EDUCATION:  Continued Pt/ CG edu for lymphedema self care home program throughout session. Topics include outcome of comparative limb volumetrics- starting limb volume differentials (LVDs), technology and gradient techniques used for short stretch, multilayer compression wrapping, simple self-MLD, therapeutic lymphatic pumping exercises, skin/nail care, LE precautions,. compression garment recommendations and specifications, wear and care schedule and compression garment donning / doffing w assistive devices. Discussed progress towards all OT goals since commencing CDT. All questions answered to the Pt's satisfaction. Good return. Person educated: Patient  Education method: Explanation, Demonstration, and Handouts Education comprehension: verbalized understanding, returned demonstration, verbal cues required, and needs further education  HOME EXERCISE PROGRAM: BLE lymphatic pumping there ex using- 1 sets of 10 reps, each exercise in order-  1-2 x daily, bilaterally Simple self MLD 1 x daily Daily skin care to increase hydration, skin mobility and decrease infection risk- can be done during MLD Compression wraps  23/7 until garment fitting complete   ASSESSMENT  CLINICAL IMPRESSION: Lymphedema is very well managed today upon removing wraps. Skin condition continues to improve evidenced by increase excursion and improved hydration. Continued MLD to LLE with simultaneous skin care for remaining times. Pt tolerated reapplication of compression wraps without increased pain. Throughout session integrated Pt and family edu for simple self MLD and lymphatic pumping therex using demonstration and verbal cuing.Reviewed simple self MLD while performing manual therapy. Cont as per POC.   (Initial Eval 09/13/23: Justene Jensen is a 68 y.o. 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 Pt denies hx of known, chronic limb swelling in her family. She does not utilize compression garments, or a vasopneumatic compression "pump" as  conservative measures. Chronic limb swelling and associated pain limits functional performance in all occupational domains, including functional ambulation and mobility, self-care and basic and instrumental ADLs. It limits participation in leisure pursuits, productive activities and social participle[ation.   Pt will benefit from  skilled Occupational Therapy for both  Intensive and Self -management phases of Complete Decongestive Therapy (CDT) to reduce limb swelling and associated pain, to limit progression, to reduce infection risk , and to increase functional performance, social participation and quality of life. CDT include manual lymphatic drainage (MLD), skin care, therapeutic exercise and compression therapies- multilayer bandaging initially, one limb at a time,  and then appropriate compression garments, or alternatives in the final phase. Pt and  caregivers who assist are trained to perform a;; aspects of Lymphedema self-care top ensure optimal self management over time. Without skilled  OT for CDT, further progression of lymphedema is certain and further functional decline is expected.)   OBJECTIVE IMPAIRMENTS: decreased activity tolerance, decreased knowledge of condition, decreased knowledge of use of DME, increased edema, pain, and chronic leg swelling .   ACTIVITY LIMITATIONS: limited functional ambulation and functional mobility 2/2 lymphedema -related foot and leg pain; standing, squatting, stairs, bathing, toileting, dressing, and hygiene/grooming  PARTICIPATION LIMITATIONS: meal prep, cooking, cleaning, driving, shopping, community activity, and volunteering  PERSONAL FACTORS: Time since onset of injury/illness/exacerbation are also affecting patient's functional outcome.   REHAB POTENTIAL: Good  CLINICAL DECISION MAKING: Evolving/moderate complexity  EVALUATION COMPLEXITY: Moderate   GOALS: Goals reviewed with patient? Yes  SHORT TERM GOALS: Target date: 4th OT Rx visit   Pt will demonstrate understanding of lymphedema precautions and prevention strategies with modified independence using a printed reference to identify at least 5 precautions and discussing how s/he may implement them into daily life to reduce risk of progression with extra time. Baseline:Max A Goal status: 11/20/23 GOAL MET  2.  Pt will be able to apply multilayer, knee length, gradient, compression wraps to one leg at a time with max caregiver assistance to decrease limb volume, to limit infection risk, and to limit lymphedema progression.  Baseline: Dependent Goal status: 11/13/23 GOAL MET  LONG TERM GOALS: Target date: 12/12/22  Given this patient's Intake score of TBD % on the functional outcomes FOTO tool, patient will experience an increase in function of 5 points to improve basic and instrumental ADLs performance, including lymphedema  self-care.  Baseline: Max A Goal status: GOAL DEFERRED. FOTO tool discontinued.  2.  Given this patient's Intake score of 64.71 on the Lymphedema Life Impact Scale (LLIS), patient will experience a reduction of at least 5 points in her perceived level of functional impairment resulting from lymphedema to improve functional performance and quality of life (QOL). Baseline: 64.71% Goal status: PROGRESSING  3.  Pt will achieve at least a 10% volume reduction in B legs to return limb to typical size and shape, to limit infection risk and LE progression, to decrease pain, to improve function. Baseline: Dependent Goal status: PROGRESSING  4.  Pt will obtain proper compression garments/devices and achieve modified independence (extra time + assistive devices) with donning/doffing to optimize limb volume reductions and limit LE progression over time. Baseline:  Goal status: ONGOING PLAN:  OT FREQUENCY: 2x/week  OT DURATION: 12 weeks  PLANNED INTERVENTIONS: 97110-Therapeutic exercises, 97530- Therapeutic activity, 97535- Self Care, 16109- Manual therapy, Patient/Family education, Manual lymph drainage, Compression bandaging, DME instructions, and fitting with compression garments  Custom-made gradient compression garments and HOS devices are medically necessary because they are uniquely  sized and shaped to fit the exact dimensions of the affected extremities, and to provide appropriate medical grade, graduated compression essential for optimally managing chronic, progressive lymphedema. Multiple custom compression garments are needed to ensure proper hygiene to limit infection risk. Custom compression garments should be replaced q 3-6 months When worn consistently for optimal lipo-lymphedema self-management over time. HOS devices, medically necessary to limit fibrosis buildup in tissue, should be replaced q 2 years and PRN when worn out.     Pt should be fit:  3 Left and 3 Right, custom, Jobst, flat  knit, Elvarex Classic, ccl 2 or 3, knee length compression stockings with open toes to limit and control chronic leg swelling, to improve lymphatic circulation, to reduce infection risk, to limit formation of tissue fibrosis and to limit progression of lymphedema over time. Custom compression garments should be replaced q 3-6 months when worn consistently for optimal lymphedema self-management.   Pt should be fit with 1 R and 1 L custom, Jobst Relax convoluted Hours-of-Sleep (HOS)  compression device to counteract fluid accumulation and fibrosis formation leading to lymphedema progression. at night. HOS devices, medically necessary to limit fibrosis buildup in tissue, should be replaced q 2 years and PRN when worn out.    PLAN FOR NEXT SESSION:  LLE MLD as established using functional inguinal LN and deep abdominal lymphatics. Multilayer compression bandaging to L leg using 1 each 8, 10 and 12 cm wide x 5 meters long short stretch wrap over single layer of rosidal foam and cotton stockinett using gradient techniques. Pt instructed to remove wraps and report symptoms to her doctor if she experiences acute pain in the limb, signs/symptoms of infection, or atypical sob.  Pt / family edu for LE self care: simple self MLD leg sequence  Loel Dubonnet, MS, OTR/L, CLT-LANA 12/25/23 4:11 PM

## 2023-12-25 NOTE — Telephone Encounter (Signed)
 Called patient and not able to lvm

## 2023-12-25 NOTE — Telephone Encounter (Signed)
 Called patient to r/s appt with Para March, not able to lvm.

## 2023-12-26 NOTE — Telephone Encounter (Signed)
 Contacted pt, vm box is full.

## 2023-12-26 NOTE — Telephone Encounter (Signed)
 Please try calling patient and setting up her appointment with Dr. Para March. Thank you

## 2023-12-27 ENCOUNTER — Ambulatory Visit: Payer: HMO | Admitting: Occupational Therapy

## 2023-12-27 DIAGNOSIS — M25551 Pain in right hip: Secondary | ICD-10-CM | POA: Diagnosis not present

## 2023-12-27 DIAGNOSIS — M62831 Muscle spasm of calf: Secondary | ICD-10-CM | POA: Diagnosis not present

## 2023-12-27 DIAGNOSIS — Z79891 Long term (current) use of opiate analgesic: Secondary | ICD-10-CM | POA: Diagnosis not present

## 2023-12-27 DIAGNOSIS — G894 Chronic pain syndrome: Secondary | ICD-10-CM | POA: Diagnosis not present

## 2023-12-27 DIAGNOSIS — M150 Primary generalized (osteo)arthritis: Secondary | ICD-10-CM | POA: Diagnosis not present

## 2023-12-28 ENCOUNTER — Ambulatory Visit: Payer: HMO | Admitting: Family Medicine

## 2023-12-28 ENCOUNTER — Ambulatory Visit: Admitting: General Practice

## 2023-12-28 ENCOUNTER — Encounter: Payer: Self-pay | Admitting: General Practice

## 2023-12-28 ENCOUNTER — Other Ambulatory Visit (HOSPITAL_COMMUNITY): Payer: Self-pay

## 2023-12-28 ENCOUNTER — Other Ambulatory Visit: Payer: Self-pay | Admitting: Pharmacist

## 2023-12-28 VITALS — BP 130/86 | HR 85 | Temp 97.5°F | Ht 66.0 in | Wt 247.0 lb

## 2023-12-28 DIAGNOSIS — I10 Essential (primary) hypertension: Secondary | ICD-10-CM

## 2023-12-28 DIAGNOSIS — Z6841 Body Mass Index (BMI) 40.0 and over, adult: Secondary | ICD-10-CM | POA: Diagnosis not present

## 2023-12-28 DIAGNOSIS — I83028 Varicose veins of left lower extremity with ulcer other part of lower leg: Secondary | ICD-10-CM | POA: Diagnosis not present

## 2023-12-28 DIAGNOSIS — D649 Anemia, unspecified: Secondary | ICD-10-CM | POA: Diagnosis not present

## 2023-12-28 DIAGNOSIS — L4050 Arthropathic psoriasis, unspecified: Secondary | ICD-10-CM

## 2023-12-28 DIAGNOSIS — N1831 Chronic kidney disease, stage 3a: Secondary | ICD-10-CM | POA: Diagnosis not present

## 2023-12-28 DIAGNOSIS — Z7689 Persons encountering health services in other specified circumstances: Secondary | ICD-10-CM | POA: Insufficient documentation

## 2023-12-28 DIAGNOSIS — L97821 Non-pressure chronic ulcer of other part of left lower leg limited to breakdown of skin: Secondary | ICD-10-CM | POA: Diagnosis not present

## 2023-12-28 LAB — CBC
HCT: 40.2 % (ref 36.0–46.0)
Hemoglobin: 13 g/dL (ref 12.0–15.0)
MCHC: 32.4 g/dL (ref 30.0–36.0)
MCV: 95.8 fl (ref 78.0–100.0)
Platelets: 345 10*3/uL (ref 150.0–400.0)
RBC: 4.2 Mil/uL (ref 3.87–5.11)
RDW: 13.9 % (ref 11.5–15.5)
WBC: 7 10*3/uL (ref 4.0–10.5)

## 2023-12-28 LAB — IBC + FERRITIN
Ferritin: 100.9 ng/mL (ref 10.0–291.0)
Iron: 111 ug/dL (ref 42–145)
Saturation Ratios: 39.3 % (ref 20.0–50.0)
TIBC: 282.8 ug/dL (ref 250.0–450.0)
Transferrin: 202 mg/dL — ABNORMAL LOW (ref 212.0–360.0)

## 2023-12-28 LAB — COMPREHENSIVE METABOLIC PANEL
ALT: 13 U/L (ref 0–35)
AST: 23 U/L (ref 0–37)
Albumin: 4.5 g/dL (ref 3.5–5.2)
Alkaline Phosphatase: 62 U/L (ref 39–117)
BUN: 15 mg/dL (ref 6–23)
CO2: 31 meq/L (ref 19–32)
Calcium: 9.6 mg/dL (ref 8.4–10.5)
Chloride: 94 meq/L — ABNORMAL LOW (ref 96–112)
Creatinine, Ser: 1.03 mg/dL (ref 0.40–1.20)
GFR: 56.67 mL/min — ABNORMAL LOW (ref >60.00)
Glucose, Bld: 97 mg/dL (ref 70–99)
Potassium: 3.9 meq/L (ref 3.5–5.1)
Sodium: 134 meq/L — ABNORMAL LOW (ref 135–145)
Total Bilirubin: 0.8 mg/dL (ref 0.2–1.2)
Total Protein: 7 g/dL (ref 6.0–8.3)

## 2023-12-28 MED ORDER — TIRZEPATIDE-WEIGHT MANAGEMENT 10 MG/0.5ML ~~LOC~~ SOAJ: 10.0000 mg | SUBCUTANEOUS | 0 refills | Status: DC

## 2023-12-28 MED ORDER — LISINOPRIL 10 MG PO TABS: 10.0000 mg | ORAL_TABLET | Freq: Every day | ORAL | 0 refills | Status: DC

## 2023-12-28 MED ORDER — LISINOPRIL 10 MG PO TABS: 10.0000 mg | ORAL_TABLET | Freq: Every day | ORAL | 1 refills | Status: DC

## 2023-12-28 NOTE — Patient Instructions (Addendum)
 Stop by the lab prior to leaving today. I will notify you of your results once received.   Refills sent on lisinopril.   Follow up with in two weeks.

## 2023-12-28 NOTE — Assessment & Plan Note (Signed)
 Controlled.   Followed by Rheumatology.   Has a follow up next week to discuss new regimen as the previous one is not covered by insurance anymore.

## 2023-12-28 NOTE — Assessment & Plan Note (Signed)
 Uncontrolled.   Currently has wraps on both legs.   Continue lymphedema therapy.

## 2023-12-28 NOTE — Assessment & Plan Note (Signed)
 Discussed at length with patient.   At this time, discontinue Phentermine as risks outweighs the benefits. Patient is agreeable.   Continue zepbound.  Rx sent for Zepbound 10 mg once weekly for four weeks.

## 2023-12-28 NOTE — Progress Notes (Signed)
 New Patient Office Visit  Subjective    Patient ID: [name], female    DOB: [birth year]  Age: 68 y.o. MRN: [id number]  CC:  Chief Complaint  Patient presents with   New Patient (Initial Visit)   Weight Loss    Needs zepbound refill; going up to the 10 mg.     HPI [name] is a 68 y.o. female presents to establish care.   Previous PCP, physical, labs: Dr. Myrene Galas.   HTN: diagnosed four years ago. Currently managed lisinopril and hydrochlorothiazide. No  cough. Currently managed on Lisinopril and hydrochlorothiazide. Tolerating well. Denies any cough. Does not check BP at home.  Obesity: gastric bypass many years ago. Started Zepbound at October, 2024.  Gets it through the [facility]. Needs to get 10 mg dose filed and send rx to [facility] as her insurance declined prior authorization. She has also been taking Phentermine along with zepbound for weight loss. She does have sweet tooth. She tries to eat healthy. Does not exercise due to lymphedema in both legs.    Psoriatic arthritis- diagnosed several years ago. sees rheumatologist. She has been on Cimzia infusions for 3-4 once monthly. Last infusion was on January 2nd. When she went yesterday, the insurance does not cover it and she has a follow up with her Rheumatologist.    Lymphedema: Goes to rehab saw poditary on 09/11/23 who referred her for lymphedema therapy. Reports that swelling has decreased tremendously. No concerns today.    Outpatient Encounter Medications as of 12/28/2023  Medication Sig   BIOTIN PO Take 1 tablet by mouth daily.   Cyanocobalamin (B-12 PO) Take 1 tablet by mouth daily.   Dihydroxyaluminum Sod Carb (ROLAIDS PO) Take 4 each by mouth daily as needed.   DULoxetine (CYMBALTA) 60 MG capsule Take 1 capsule (60 mg total) by mouth daily.   ferrous sulfate 325 (65 FE) MG tablet TAKE 1 TABLET BY MOUTH EVERY DAY   hydrochlorothiazide (HYDRODIURIL) 12.5 MG tablet TAKE 1 TABLET BY MOUTH EVERY DAY   Magnesium  400 MG TABS Take 1 tablet by mouth daily.   morphine (MS CONTIN) 30 MG 12 hr tablet Take 1 tablet (30 mg total) by mouth every 12 (twelve) hours.   phentermine (ADIPEX-P) 37.5 MG tablet Take 1/2 to 1 tablet every Morning for Dieting & Weight Loss   SYMPROIC 0.2 MG TABS Take 1 tablet by mouth daily as needed.   tiZANidine (ZANAFLEX) 2 MG tablet Take 1 tablet (2 mg total) by mouth 3 (three) times daily as needed.   traZODone (DESYREL) 50 MG tablet Take 1 tablet (50 mg total) by mouth at bedtime.   [DISCONTINUED] lisinopril (ZESTRIL) 10 MG tablet Take 1 tablet daily for BP (Patient taking differently: Take 10 mg by mouth daily.)   [DISCONTINUED] ZEPBOUND 5 MG/0.5ML injection vial INJECT 0.5 ML (5 MG) UNDER THE SKIN ONCE WEEKLY (0.5ML= 50 UNITS)   lisinopril (ZESTRIL) 10 MG tablet Take 1 tablet (10 mg total) by mouth daily.   [DISCONTINUED] amoxicillin-clavulanate (AUGMENTIN) 875-125 MG tablet Take 1 tablet by mouth every 12 (twelve) hours.   [DISCONTINUED] EPINEPHrine 0.3 mg/0.3 mL IJ SOAJ injection Inject 0.3 mLs (0.3 mg total) into the muscle once. (Patient not taking: Reported on 11/02/2023)   [DISCONTINUED] lisinopril (ZESTRIL) 10 MG tablet Take 1 tablet (10 mg total) by mouth daily.   [DISCONTINUED] lisinopril (ZESTRIL) 10 MG tablet Take 1 tablet (10 mg total) by mouth daily.   [DISCONTINUED] lisinopril (ZESTRIL) 10 MG tablet  Take 1 tablet (10 mg total) by mouth daily.   [DISCONTINUED] morphine (MS CONTIN) 30 MG 12 hr tablet Take 1 tablet (30 mg total) by mouth every 8 (eight) hours.   [DISCONTINUED] morphine (MS CONTIN) 30 MG 12 hr tablet Take 1 tablet by mouth every twelve hours.   Facility-Administered Encounter Medications as of 12/28/2023  Medication   ipratropium-albuterol (DUONEB) 0.5-2.5 (3) MG/3ML nebulizer solution 3 mL    Past Medical History:  Diagnosis Date   Allergy    Anemia    Anxiety    Arthritis    Chronic venous insufficiency 12/04/2015   Depression    Fibromyalgia     History of chicken pox    HTN (hypertension)    MRSA (methicillin resistant Staphylococcus aureus)    Obese    Plantar fasciitis    Pneumonia    Seasonal allergies    Sleep apnea 2012   took test in [place]-told to use cpap-could not ude    Past Surgical History:  Procedure Laterality Date   ANTERIOR CERVICAL DECOMP/DISCECTOMY FUSION  05/09/2008   C5-6, C6-7; with arthrodesis also   GASTRIC BYPASS  10/24/2000   KNEE ARTHROSCOPY  10/24/2002   rt and lt knee   ROTATOR CUFF REPAIR W/ DISTAL CLAVICLE EXCISION  01/06/2006   right   SHOULDER ARTHROSCOPY Left 10/24/2001   TONSILLECTOMY     TOTAL KNEE ARTHROPLASTY  03/29/2004   left   TOTAL KNEE ARTHROPLASTY  11/07/2003   right   TOTAL KNEE REVISION  07/30/2010   right; with lateral release   UPPER GASTROINTESTINAL ENDOSCOPY      Family History  Problem Relation Age of Onset   Heart disease Mother    COPD Mother        smoker   Bladder Cancer Father    Heart attack Brother    Heart disease Maternal Grandfather    Alcoholism Maternal Grandfather    Diabetes Paternal Grandfather    Colon cancer Other    Esophageal cancer Neg Hx    Rectal cancer Neg Hx    Stomach cancer Neg Hx     Social History   Socioeconomic History   Marital status: Married    Spouse name: Not on file   Number of children: 0   Years of education: Not on file   Highest education level: Not on file  Occupational History   Occupation: retired school system  Tobacco Use   Smoking status: Former    Current packs/day: 0.00    Types: Cigarettes    Quit date: 05/16/1981    Years since quitting: 42.6   Smokeless tobacco: Never  Vaping Use   Vaping status: Never Used  Substance and Sexual Activity   Alcohol use: Yes    Alcohol/week: 28.0 standard drinks of alcohol    Types: 28 Cans of beer per week    Comment: 2-4 per day   Drug use: No   Sexual activity: Not Currently    Partners: Male    Birth control/protection: Post-menopausal     Comment: INTERCOURSE AGE 5, SEXUAL PARTNERS MORE THAN 5  Other Topics Concern   Not on file  Social History Narrative   Pt lives at home with husband, retired IT sales professional. They are caretakers to a 67 yo nonverbal autistic woman.    Social Drivers of Corporate investment banker Strain: Low Risk  (03/20/2019)   Overall Financial Resource Strain (CARDIA)    Difficulty of Paying Living Expenses: Not hard at all  Food Insecurity: No Food Insecurity (03/20/2019)   Hunger Vital Sign    Worried About Running Out of Food in the Last Year: Never true    Ran Out of Food in the Last Year: Never true  Transportation Needs: No Transportation Needs (03/20/2019)   PRAPARE - Administrator, Civil Service (Medical): No    Lack of Transportation (Non-Medical): No  Physical Activity: Not on file  Stress: Not on file  Social Connections: Unknown (03/20/2019)   Social Connection and Isolation Panel [NHANES]    Frequency of Communication with Friends and Family: Three times a week    Frequency of Social Gatherings with Friends and Family: Twice a week    Attends Religious Services: 1 to 4 times per year    Active Member of [employer] or Organizations: Not on file    Attends Banker Meetings: Not on file    Marital Status: Married  Intimate Partner Violence: Not At Risk (03/20/2019)   Humiliation, Afraid, Rape, and Kick questionnaire    Fear of Current or Ex-Partner: No    Emotionally Abused: No    Physically Abused: No    Sexually Abused: No    Review of Systems  Constitutional:  Negative for chills and fever.  Respiratory:  Negative for shortness of breath.   Cardiovascular:  Negative for chest pain.  Gastrointestinal:  Negative for abdominal pain, constipation, diarrhea, heartburn, nausea and vomiting.  Genitourinary:  Negative for dysuria, frequency and urgency.  Neurological:  Negative for dizziness and headaches.  Endo/Heme/Allergies:  Negative for polydipsia.   Psychiatric/Behavioral:  Negative for depression and suicidal ideas. The patient is not nervous/anxious.         Objective    BP 130/86 (BP Location: Left Arm, Patient Position: Sitting, Cuff Size: Large)   Pulse 85   Temp (!) 97.5 F (36.4 C) (Oral)   Ht 5\' 6"  (1.676 m)   Wt 247 lb (112 kg)   SpO2 97%   BMI 39.87 kg/m   Physical Exam Vitals and nursing note reviewed.  Constitutional:      Appearance: Normal appearance.  Cardiovascular:     Rate and Rhythm: Normal rate and regular rhythm.     Pulses: Normal pulses.     Heart sounds: Normal heart sounds.  Pulmonary:     Effort: Pulmonary effort is normal.     Breath sounds: Normal breath sounds.  Neurological:     Mental Status: She is alert and oriented to person, place, and time.  Psychiatric:        Mood and Affect: Mood normal.        Behavior: Behavior normal.        Thought Content: Thought content normal.        Judgment: Judgment normal.         Assessment & Plan:  Morbid obesity with BMI of 40.0-44.9, adult [facility]) Assessment & Plan: Discussed at length with patient.   At this time, discontinue Phentermine as risks outweighs the benefits. Patient is agreeable.   Continue zepbound.  Rx sent for Zepbound 10 mg once weekly for four weeks.  Orders: -     CBC -     Comprehensive metabolic panel  Essential hypertension Assessment & Plan: Controlled with BP reading of 130/86.   Continue Lisinopril and hydrochlorothiazide.   Discussed monitoring diet and exercise.  Orders: -     CBC -     Comprehensive metabolic panel -     Lisinopril; Take  1 tablet (10 mg total) by mouth daily.  Dispense: 90 tablet; Refill: 1  Chronic anemia Assessment & Plan: Controlled.   Continue iron.   Cbc and iron studies pending.   Orders: -     IBC + Ferritin  Psoriatic arthritis (HCC) Assessment & Plan: Controlled.   Followed by Rheumatology.   Has a follow up next week to discuss new regimen as the  previous one is not covered by insurance anymore.    Establishing care with new doctor, encounter for Assessment & Plan: EMR reviewed briefly.   Venous stasis ulcer of other part of left lower leg limited to breakdown of skin with varicose veins (HCC) Assessment & Plan: Uncontrolled.   Currently has wraps on both legs.   Continue lymphedema therapy.   Stage 3a chronic kidney disease (HCC) Assessment & Plan: Chronic.   Repeat CMP pending     Return in about 2 weeks (around 01/11/2024) for pain management clinic, depression and other chronic conditions.   Modesto Charon, NP

## 2023-12-28 NOTE — Assessment & Plan Note (Signed)
 Controlled with BP reading of 130/86.   Continue Lisinopril and hydrochlorothiazide.   Discussed monitoring diet and exercise.

## 2023-12-28 NOTE — Assessment & Plan Note (Signed)
 Chronic.   Repeat CMP pending

## 2023-12-28 NOTE — Assessment & Plan Note (Signed)
 EMR reviewed briefly.

## 2023-12-28 NOTE — Assessment & Plan Note (Signed)
 Controlled.   Continue iron.   Cbc and iron studies pending.

## 2023-12-30 ENCOUNTER — Other Ambulatory Visit (HOSPITAL_COMMUNITY): Payer: Self-pay

## 2024-01-01 ENCOUNTER — Ambulatory Visit: Payer: HMO | Admitting: Occupational Therapy

## 2024-01-01 DIAGNOSIS — Z79899 Other long term (current) drug therapy: Secondary | ICD-10-CM | POA: Diagnosis not present

## 2024-01-01 DIAGNOSIS — L4050 Arthropathic psoriasis, unspecified: Secondary | ICD-10-CM | POA: Diagnosis not present

## 2024-01-01 DIAGNOSIS — G894 Chronic pain syndrome: Secondary | ICD-10-CM | POA: Diagnosis not present

## 2024-01-01 DIAGNOSIS — M2550 Pain in unspecified joint: Secondary | ICD-10-CM | POA: Diagnosis not present

## 2024-01-01 DIAGNOSIS — Z79891 Long term (current) use of opiate analgesic: Secondary | ICD-10-CM | POA: Diagnosis not present

## 2024-01-01 DIAGNOSIS — M1990 Unspecified osteoarthritis, unspecified site: Secondary | ICD-10-CM | POA: Diagnosis not present

## 2024-01-01 DIAGNOSIS — M549 Dorsalgia, unspecified: Secondary | ICD-10-CM | POA: Diagnosis not present

## 2024-01-01 DIAGNOSIS — Z872 Personal history of diseases of the skin and subcutaneous tissue: Secondary | ICD-10-CM | POA: Diagnosis not present

## 2024-01-01 DIAGNOSIS — M797 Fibromyalgia: Secondary | ICD-10-CM | POA: Diagnosis not present

## 2024-01-01 DIAGNOSIS — M79644 Pain in right finger(s): Secondary | ICD-10-CM | POA: Diagnosis not present

## 2024-01-03 ENCOUNTER — Ambulatory Visit: Payer: BC Managed Care – PPO | Admitting: Occupational Therapy

## 2024-01-03 ENCOUNTER — Other Ambulatory Visit (HOSPITAL_COMMUNITY): Payer: Self-pay

## 2024-01-03 DIAGNOSIS — I890 Lymphedema, not elsewhere classified: Secondary | ICD-10-CM

## 2024-01-03 NOTE — Therapy (Signed)
 OUTPATIENT OCCUPATIONAL THERAPY TREATMENT NOTE  LOWER EXTREMITY LYMPHEDEMA Patient Name: [name] MRN: [id number] DOB:[birth year], 68 y.o., female Today's Date: 01/03/2024  REPORTING PERIOD:   END OF SESSION:   OT End of Session - 01/03/24 1404     Visit Number 14    Number of Visits 36    Date for OT Re-Evaluation 03/20/24    OT Start Time 0200    OT Stop Time 0305    OT Time Calculation (min) 65 min    Activity Tolerance Patient tolerated treatment well;No increased pain    Behavior During Therapy WFL for tasks assessed/performed             Past Medical History:  Diagnosis Date   Allergy    Anemia    Anxiety    Arthritis    Chronic venous insufficiency 12/04/2015   Depression    Fibromyalgia    History of chicken pox    HTN (hypertension)    MRSA (methicillin resistant Staphylococcus aureus)    Obese    Plantar fasciitis    Pneumonia    Seasonal allergies    Sleep apnea 2012   took test in [place]-told to use cpap-could not ude   Past Surgical History:  Procedure Laterality Date   ANTERIOR CERVICAL DECOMP/DISCECTOMY FUSION  05/09/2008   C5-6, C6-7; with arthrodesis also   GASTRIC BYPASS  10/24/2000   KNEE ARTHROSCOPY  10/24/2002   rt and lt knee   ROTATOR CUFF REPAIR W/ DISTAL CLAVICLE EXCISION  01/06/2006   right   SHOULDER ARTHROSCOPY Left 10/24/2001   TONSILLECTOMY     TOTAL KNEE ARTHROPLASTY  03/29/2004   left   TOTAL KNEE ARTHROPLASTY  11/07/2003   right   TOTAL KNEE REVISION  07/30/2010   right; with lateral release   UPPER GASTROINTESTINAL ENDOSCOPY     Patient Active Problem List   Diagnosis Date Noted   Establishing care with new doctor, encounter for 12/28/2023   Venous stasis ulcer of other part of left lower leg limited to breakdown of skin with varicose veins (HCC) 12/28/2023   Stage 3a chronic kidney disease (HCC) 12/28/2023   Essential hypertension 02/14/2022   CAP (community acquired pneumonia) 06/09/2017   ADHD,  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   Insomnia    Psoriatic arthritis (HCC)    Cough    Chronic anemia 12/05/2015   Hoarding disorder 03/05/2015   Alcohol abuse 11/21/2014   Anxiety state 11/21/2014   Morbid obesity with BMI of 40.0-44.9, adult (HCC) 11/21/2014   Hematemesis 03/07/2014   Depressive disorder 12/09/2013   GAD (generalized anxiety disorder) 09/17/2013   Fibromyalgia 07/22/2013    PCP: Adela Glimpse, NP  REFERRING PROVIDER: Lucia Estelle, DPM  REFERRING DIAG: I89.0  THERAPY DIAG:  Lymphedema, not elsewhere classified  Rationale for Evaluation and Treatment: Rehabilitation  ONSET DATE: "I've had leg swelling for years. I don't know when it started. " + family hx of leg swelling-mother  SUBJECTIVE:  SUBJECTIVE STATEMENT: [name] returns to OT to commence CDT to RLE. She is accompanied by her spouse, [name]. Pt has no new complaints.  She denies LE-related leg pain. Pt reports burning in bottoms of feet is ongoing.  PERTINENT HISTORY: Pls see active problem list above  PAIN:  Are you having LE related pain? No: NPRS scale: 0/10 Pain location: feet Pain description: burning Aggravating factors: walking Relieving factors: elevation  PRECAUTIONS: Other: LYMPHEDEMA PRECAUTIONS  WEIGHT BEARING RESTRICTIONS: No  FALLS:  Has patient fallen in last 6 months? 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   OBJECTIVE: Note: Objective measures were completed at Evaluation unless otherwise noted.  COGNITION:  Overall cognitive status: frequent redirection needed during eval to stay on topic    POSTURE: WFL  LE ROM: grossly WFL  STRENGTH: grossly WFL   BLE COMPARATIVE LIMB VOLUMETRICS INITIAL 11/01/23  LANDMARK RIGHT  (dominant)  R LEG (A-D) 4296.6 ml  R THIGH (E-G) ml  R FULL LIMB (A-G) ml  Limb Volume differential (LVD)  %  Volume change since initial %  Volume change overall V  (Blank rows = not tested)  LANDMARK LEFT    L LEG (A-D) 4829.1 ml  L  THIGH (E-G) ml  L FULL LIMB (A-G) ml  Limb Volume differential (LVD)   12.4%, L>R  Volume change since initial %  Volume change overall %  (Blank rows = not tested)    9th Visit 12/06/23        LANDMARK LEFT    L LEG (A-D) 4888.8 ml  L  THIGH (E-G) ml  L FULL LIMB (A-G) ml  Limb Volume differential (LVD)     Volume change since initial  + 1.2 %  Volume change overall %  (Blank rows = not tested)    Mild, Stage  II, 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 Obesity  Skin  Description Hyper-Keratosis Peau' de Orange Shiny Tight Fibrotic/ Indurated Fatty Doughy Spongy/ boggy   x    Bilateral Chronic Lipo dermatosclerosis Hardened, thickened, leathery      Skin dry Flaky WNL Macerated   mildly x     Color Redness Varicosities Blanching Hemosiderin Stain Mottled   x x x  Severe bilaterally x   Odor Malodorous Yeast Fungal infection  WNL      x   Temperature Warm Cool wnl    x     Pitting Edema   1+ 2+ 3+ 4+ Non-pitting         x   Girth Symmetrical Asymmetrical                   Distribution    L>R toes to popliteal     Stemmer Sign Positive Negative   +    Lymphorrhea History Of:  Present Absent     x    Wounds History Of Present Absent  Venous Arterial Pressure Sheer     x        Signs of Infection  Redness Warmth Erythema Acute Swelling Drainage Borders                    Sensation Light Touch Deep pressure Hypersensitivity Neuropathic pain   In Tact Impaired In tact Impaired Absent Impaired Plantar surfaces bilaterally   x  x  x      Nails WNL   Fungus nail dystrophy     x   Hair Growth Symmetrical Asymmetrical   x    Skin Creases Base of toes  Ankles   Base of Fingers knees       Abdominal pannus Thigh Lobules  Face/neck   x           TODAY'S TREATMENT:                                                                                                                                         LLE/LLQ MLD Simultaneous skin  PATIENT EDUCATION:  Continued Pt/ CG edu for lymphedema self care home program throughout session. Topics include outcome of comparative limb volumetrics- starting limb volume differentials (LVDs), technology and gradient techniques used for short stretch, multilayer compression wrapping, simple self-MLD, therapeutic lymphatic pumping exercises, skin/nail care, LE precautions,. compression garment recommendations and specifications, wear and care schedule and compression garment donning / doffing w assistive devices. Discussed progress towards all OT goals since commencing CDT. All questions answered to the Pt's satisfaction. Good return. Person educated: Patient  Education method: Explanation, Demonstration, and Handouts Education comprehension: verbalized understanding, returned demonstration, verbal cues required, and needs further education  HOME EXERCISE PROGRAM: BLE lymphatic pumping there ex using- 1 sets of 10 reps, each exercise in order-  1-2 x daily, bilaterally Simple self MLD 1 x daily Daily skin care to increase hydration, skin mobility and decrease infection risk- can be done during MLD Compression wraps 23/7 until garment fitting complete   ASSESSMENT  CLINICAL  IMPRESSION: Lymphedema is very well managed today upon removing wraps. Skin condition continues to improve evidenced by increase excursion and improved hydration. Continued MLD to LLE with simultaneous skin care for remaining times. Pt tolerated reapplication of compression wraps without increased pain. Throughout session integrated Pt and family edu for simple self MLD and lymphatic pumping therex using demonstration and verbal cuing.Reviewed simple self MLD while performing manual therapy. Cont as per POC.   (Initial Eval 09/13/23: [name] is a 68 y.o. 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 Pt denies hx of known, chronic limb swelling in her family. She does not utilize compression garments, or a vasopneumatic compression "pump" as conservative measures. Chronic limb swelling and associated pain limits functional performance in all occupational domains, including functional ambulation and mobility,  self-care and basic and instrumental ADLs. It limits participation in leisure pursuits, productive activities and social participle[ation.   Pt will benefit from  skilled Occupational Therapy for both  Intensive and Self -management phases of Complete Decongestive Therapy (CDT) to reduce limb swelling and associated pain, to limit progression, to reduce infection risk , and to increase functional performance, social participation and quality of life. CDT include manual lymphatic drainage (MLD), skin care, therapeutic exercise and compression therapies- multilayer bandaging initially, one limb at a time,  and then appropriate compression garments, or alternatives in the final phase. Pt and caregivers who assist are trained to perform a;; aspects of Lymphedema  self-care top ensure optimal self management over time. Without skilled  OT for CDT, further progression of lymphedema is certain and further functional decline is expected.)   OBJECTIVE IMPAIRMENTS: decreased activity tolerance, decreased knowledge of condition, decreased knowledge of use of DME, increased edema, pain, and chronic leg swelling .   ACTIVITY LIMITATIONS: limited functional ambulation and functional mobility 2/2 lymphedema -related foot and leg pain; standing, squatting, stairs, bathing, toileting, dressing, and hygiene/grooming  PARTICIPATION LIMITATIONS: meal prep, cooking, cleaning, driving, shopping, community activity, and volunteering  PERSONAL FACTORS: Time since onset of injury/illness/exacerbation are also affecting patient's functional outcome.   REHAB POTENTIAL: Good  CLINICAL DECISION MAKING: Evolving/moderate complexity  EVALUATION COMPLEXITY: Moderate   GOALS: Goals reviewed with patient? Yes  SHORT TERM GOALS: Target date: 4th OT Rx visit   Pt will demonstrate understanding of lymphedema precautions and prevention strategies with modified independence using a printed reference to identify at least 5 precautions and discussing how s/he may implement them into daily life to reduce risk of progression with extra time. Baseline:Max A Goal status: 11/20/23 GOAL MET  2.  Pt will be able to apply multilayer, knee length, gradient, compression wraps to one leg at a time with max caregiver assistance to decrease limb volume, to limit infection risk, and to limit lymphedema progression.  Baseline: Dependent Goal status: 11/13/23 GOAL MET  LONG TERM GOALS: Target date: 12/12/22  Given this patient's Intake score of TBD % on the functional outcomes FOTO tool, patient will experience an increase in function of 5 points to improve basic and instrumental ADLs performance, including lymphedema self-care.  Baseline: Max A Goal status: GOAL DEFERRED. FOTO tool  discontinued.  2.  Given this patient's Intake score of 64.71 on the Lymphedema Life Impact Scale (LLIS), patient will experience a reduction of at least 5 points in her perceived level of functional impairment resulting from lymphedema to improve functional performance and quality of life (QOL). Baseline: 64.71% Goal status: PROGRESSING  3.  Pt will achieve at least a 10% volume reduction in B legs to return limb to typical size and shape, to limit infection risk and LE progression, to decrease pain, to improve function. Baseline: Dependent Goal status: PROGRESSING  4.  Pt will obtain proper compression garments/devices and achieve modified independence (extra time + assistive devices) with donning/doffing to optimize limb volume reductions and limit LE progression over time. Baseline:  Goal status: ONGOING PLAN:  OT FREQUENCY: 2x/week  OT DURATION: 12 weeks  PLANNED INTERVENTIONS: 97110-Therapeutic exercises, 97530- Therapeutic activity, 97535- Self Care, 16109- Manual therapy, Patient/Family education, Manual lymph drainage, Compression bandaging, DME instructions, and fitting with compression garments  Custom-made gradient compression garments and HOS devices are medically necessary because they are uniquely sized and shaped to fit the exact dimensions of the affected extremities, and to provide appropriate medical grade, graduated compression  essential for optimally managing chronic, progressive lymphedema. Multiple custom compression garments are needed to ensure proper hygiene to limit infection risk. Custom compression garments should be replaced q 3-6 months When worn consistently for optimal lipo-lymphedema self-management over time. HOS devices, medically necessary to limit fibrosis buildup in tissue, should be replaced q 2 years and PRN when worn out.     Pt should be fit:  3 Left and 3 Right, custom, Jobst, flat knit, Elvarex Classic, ccl 2 or 3, knee length compression  stockings with open toes to limit and control chronic leg swelling, to improve lymphatic circulation, to reduce infection risk, to limit formation of tissue fibrosis and to limit progression of lymphedema over time. Custom compression garments should be replaced q 3-6 months when worn consistently for optimal lymphedema self-management.   Pt should be fit with 1 R and 1 L custom, Jobst Relax convoluted Hours-of-Sleep (HOS)  compression device to counteract fluid accumulation and fibrosis formation leading to lymphedema progression. at night. HOS devices, medically necessary to limit fibrosis buildup in tissue, should be replaced q 2 years and PRN when worn out.    PLAN FOR NEXT SESSION:  LLE MLD as established using functional inguinal LN and deep abdominal lymphatics. Multilayer compression bandaging to L leg using 1 each 8, 10 and 12 cm wide x 5 meters long short stretch wrap over single layer of rosidal foam and cotton stockinett using gradient techniques. Pt instructed to remove wraps and report symptoms to her doctor if she experiences acute pain in the limb, signs/symptoms of infection, or atypical sob.  Pt / family edu for LE self care: simple self MLD leg sequence  Loel Dubonnet, MS, OTR/L, CLT-LANA 01/03/24 3:59 PM

## 2024-01-04 ENCOUNTER — Other Ambulatory Visit (HOSPITAL_COMMUNITY): Payer: Self-pay

## 2024-01-08 ENCOUNTER — Other Ambulatory Visit (HOSPITAL_COMMUNITY): Payer: Self-pay

## 2024-01-08 ENCOUNTER — Ambulatory Visit: Payer: Self-pay | Admitting: Occupational Therapy

## 2024-01-10 ENCOUNTER — Ambulatory Visit: Admitting: General Practice

## 2024-01-10 ENCOUNTER — Ambulatory Visit: Payer: Self-pay | Admitting: Occupational Therapy

## 2024-01-10 ENCOUNTER — Encounter: Payer: Self-pay | Admitting: Occupational Therapy

## 2024-01-10 DIAGNOSIS — I890 Lymphedema, not elsewhere classified: Secondary | ICD-10-CM

## 2024-01-10 DIAGNOSIS — I10 Essential (primary) hypertension: Secondary | ICD-10-CM

## 2024-01-10 DIAGNOSIS — N1831 Chronic kidney disease, stage 3a: Secondary | ICD-10-CM

## 2024-01-10 DIAGNOSIS — F32A Depression, unspecified: Secondary | ICD-10-CM

## 2024-01-10 NOTE — Therapy (Unsigned)
 OUTPATIENT OCCUPATIONAL THERAPY TREATMENT NOTE  LOWER EXTREMITY LYMPHEDEMA Patient Name: [name] MRN: [id number] DOB:[birth year], 68 y.o., female Today's Date: 01/12/2024  REPORTING PERIOD:   END OF SESSION:   OT End of Session - 01/12/24 0952     Visit Number 15    Number of Visits 36    Date for OT Re-Evaluation 03/20/24    OT Start Time 0202    OT Stop Time 0313    OT Time Calculation (min) 71 min    Activity Tolerance Patient tolerated treatment well;No increased pain             OT End of Session - 01/12/24 0952     Visit Number 15    Number of Visits 36    Date for OT Re-Evaluation 03/20/24    OT Start Time 0202    OT Stop Time 0313    OT Time Calculation (min) 71 min    Activity Tolerance Patient tolerated treatment well;No increased pain               Past Medical History:  Diagnosis Date   Allergy    Anemia    Anxiety    Arthritis    Chronic venous insufficiency 12/04/2015   Depression    Fibromyalgia    History of chicken pox    HTN (hypertension)    MRSA (methicillin resistant Staphylococcus aureus)    Obese    Plantar fasciitis    Pneumonia    Seasonal allergies    Sleep apnea 2012   took test in [place]-told to use cpap-could not ude   Past Surgical History:  Procedure Laterality Date   ANTERIOR CERVICAL DECOMP/DISCECTOMY FUSION  05/09/2008   C5-6, C6-7; with arthrodesis also   GASTRIC BYPASS  10/24/2000   KNEE ARTHROSCOPY  10/24/2002   rt and lt knee   ROTATOR CUFF REPAIR W/ DISTAL CLAVICLE EXCISION  01/06/2006   right   SHOULDER ARTHROSCOPY Left 10/24/2001   TONSILLECTOMY     TOTAL KNEE ARTHROPLASTY  03/29/2004   left   TOTAL KNEE ARTHROPLASTY  11/07/2003   right   TOTAL KNEE REVISION  07/30/2010   right; with lateral release   UPPER GASTROINTESTINAL ENDOSCOPY     Patient Active Problem List   Diagnosis Date Noted   Establishing care with new doctor, encounter for 12/28/2023   Venous stasis ulcer of other part  of left lower leg limited to breakdown of skin with varicose veins (HCC) 12/28/2023   Stage 3a chronic kidney disease (HCC) 12/28/2023   Essential hypertension 02/14/2022   CAP (community acquired pneumonia) 06/09/2017   ADHD, 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   Insomnia    Psoriatic arthritis (HCC)    Cough    Chronic anemia 12/05/2015   Hoarding disorder 03/05/2015   Alcohol abuse 11/21/2014   Anxiety state 11/21/2014   Morbid obesity with BMI of 40.0-44.9, adult (HCC) 11/21/2014   Hematemesis 03/07/2014   Depressive disorder 12/09/2013   GAD (generalized anxiety disorder) 09/17/2013   Fibromyalgia 07/22/2013    PCP: Sydney Glimpse, NP  REFERRING PROVIDER: Lucia Berry, DPM  REFERRING DIAG: I89.0  THERAPY DIAG:  Lymphedema, not elsewhere classified  Rationale for Evaluation and Treatment: Rehabilitation  ONSET DATE: "I've had leg swelling for years. I don't know when it started. " + family hx of leg swelling-mother  SUBJECTIVE:  SUBJECTIVE STATEMENT: [name] returns to OT to commence CDT to RLE. She is accompanied by her spouse, [name]. Pt has no new complaints.  She denies LE-related leg pain. Pt reports burning in bottoms of feet is ongoing. Pt and OT discussed at length plan for measuring for LLE custom compression garment. Pt agrees to completing volumetrics today to check on progress since last time we measured on visit 10 the limb volume was actually increased by 1,23%. This was discouraging as Pt states she has been nearly 100% compliant w compression wraps between visits.  PERTINENT HISTORY: Pls see active problem list above  PAIN:  Are you having LE related pain? No: NPRS scale: 0/10 Pain location: feet Pain description: burning Aggravating factors: walking Relieving  factors: elevation  PRECAUTIONS: Other: LYMPHEDEMA PRECAUTIONS  WEIGHT BEARING RESTRICTIONS: No  FALLS:  Has patient fallen in last 6 months? 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   OBJECTIVE: Note: Objective measures were completed at Evaluation unless otherwise noted.  COGNITION:  Overall cognitive status: frequent redirection needed during eval to stay on topic    POSTURE: WFL  LE ROM: grossly WFL  STRENGTH: grossly WFL   BLE COMPARATIVE LIMB VOLUMETRICS INITIAL 11/01/23  LANDMARK RIGHT  (dominant)  R LEG (A-D) 4296.6 ml  R THIGH (E-G) ml  R FULL LIMB (A-G) ml  Limb Volume differential (LVD)  %  Volume change since initial %  Volume change overall V  (Blank rows = not tested)  LANDMARK LEFT    L LEG (A-D) 4829.1 ml  L  THIGH (E-G) ml  L FULL LIMB (A-G) ml  Limb Volume differential (LVD)   12.4%, L>R  Volume change since initial %  Volume change overall %  (Blank rows = not tested)    9th Visit 12/06/23        LANDMARK LEFT    L LEG (A-D) 4888.8 ml  L  THIGH (E-G) ml  L FULL LIMB (A-G) ml  Limb Volume differential (LVD)     Volume change since initial  + 1.2 %  Volume change overall %  (Blank rows = not tested)    15 th Visit 01/10/24       LANDMARK LEFT    L LEG (A-D) 4637.8 ml  L  THIGH (E-G) ml  L FULL LIMB (A-G) ml  Limb Volume differential (LVD)     Volume change since last measured 12/06/23  DECREASED by 5.14%  Volume change  overall %  (Blank rows = not tested)    Mild, Stage  II, 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 Obesity  Skin  Description Hyper-Keratosis Peau' de Orange Shiny Tight Fibrotic/ Indurated Fatty Doughy Spongy/ boggy   x    Bilateral Chronic Lipo dermatosclerosis Hardened, thickened, leathery      Skin dry Flaky WNL Macerated   mildly x     Color Redness Varicosities Blanching Hemosiderin Stain Mottled   x x x  Severe bilaterally x   Odor Malodorous Yeast Fungal infection  WNL      x   Temperature Warm Cool wnl    x     Pitting Edema   1+ 2+ 3+ 4+ Non-pitting         x   Girth Symmetrical Asymmetrical                   Distribution    L>R toes to popliteal     Stemmer Sign Positive Negative   +    Lymphorrhea History Of:  Present Absent     x    Wounds History Of Present Absent Venous Arterial Pressure Sheer     x        Signs of Infection Redness Warmth Erythema Acute Swelling Drainage Borders                    Sensation Light Touch Deep pressure Hypersensitivity Neuropathic pain   In Tact Impaired In tact Impaired Absent Impaired Plantar surfaces bilaterally   x  x  x      Nails WNL   Fungus nail dystrophy     x   Hair Growth Symmetrical Asymmetrical   x    Skin Creases Base of toes  Ankles   Base of Fingers knees       Abdominal pannus Thigh Lobules  Face/neck   x           TODAY'S TREATMENT:                                                                                                                                         L LEG comparative limb volumetrics LLE/LLQ MLD w Simultaneous skin L LEG multilayer gradient compression wraps  PATIENT EDUCATION:  Continued Pt/ CG edu for lymphedema self care home program throughout session. Topics include outcome of comparative limb volumetrics- starting limb volume differentials (LVDs), technology and gradient techniques used for short stretch, multilayer compression wrapping, simple  self-MLD, therapeutic lymphatic pumping exercises, skin/nail care, LE precautions,. compression garment recommendations and specifications, wear and care schedule and compression garment donning / doffing w assistive devices. Discussed progress towards all OT goals since commencing CDT. All questions answered to the Pt's satisfaction. Good return. Person educated: Patient  Education method: Explanation, Demonstration, and Handouts Education comprehension: verbalized understanding, returned demonstration, verbal cues required, and needs further education  HOME EXERCISE PROGRAM: BLE lymphatic pumping there ex using- 1 sets of 10 reps, each exercise in order-  1-2 x daily, bilaterally Simple self MLD 1 x daily Daily skin care to increase hydration, skin mobility and decrease infection risk- can be done during MLD Compression wraps 23/7 until garment fitting complete   ASSESSMENT  CLINICAL IMPRESSION: LLE comparative limb volumetrics reveal L LEG volume is decreased by 5.14% since last measured on 12/06/23. Pt is pleased as this is half way to goal reduction, and she's been feeling discouraged sine  volume last taken had actually increased by 1.2%.   Lymphedema appears well managed between visits. Skin condition continues to improve evidenced by increase excursion and improved hydration. Very dark hemosiderin stain is unchanged since commencing OT for CDT. We continued MLD to LLE with simultaneous skin care for remaining time. Pt tolerated reapplication of compression wraps without increased pain. Throughout session integrated Pt and family edu for simple self MLD and lymphatic pumping therex using demonstration and verbal cuing.Reviewed simple self MLD while performing manual therapy. Pt and OT agree to continue w CDT on LLE for another week or so in effort to achieve 10% reduction goal. Cont as per POC.   (Initial Eval 09/13/23: Curtis Uriarte is a 68 y.o. female presenting with mild, BLE lymphedema 2.2  CVI and Obesity which contributes to and is affected by multiple co morbidities , including arthritis, fibromyalgia, HTN, obesity, plantar fasciitis and sleep apnea. 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 Pt denies hx of known, chronic limb swelling in her family. She does not utilize compression garments, or a vasopneumatic compression "pump" as conservative measures. Chronic limb swelling and associated pain limits functional performance in all occupational domains, including functional ambulation and mobility, self-care and basic and instrumental ADLs. It limits participation in leisure pursuits, productive activities and social participle[ation.   Pt will benefit from  skilled Occupational Therapy for both  Intensive and Self -management phases of Complete Decongestive Therapy (CDT) to reduce limb swelling and associated pain, to limit progression, to reduce infection risk , and to increase functional performance, social participation and quality of life. CDT include manual lymphatic drainage (MLD), skin care, therapeutic exercise and compression therapies- multilayer bandaging initially, one limb at a time,  and then appropriate compression garments, or alternatives in the final phase. Pt and caregivers who assist are trained to perform a;; aspects of Lymphedema self-care top ensure optimal self management over time. Without skilled  OT for CDT, further progression of lymphedema is certain and further functional decline is expected.)   OBJECTIVE IMPAIRMENTS: decreased activity tolerance, decreased knowledge of condition, decreased knowledge of use of DME, increased edema, pain, and chronic leg swelling .   ACTIVITY LIMITATIONS: limited functional ambulation and functional mobility 2/2 lymphedema -related foot and leg pain; standing, squatting, stairs, bathing, toileting, dressing, and hygiene/grooming  PARTICIPATION LIMITATIONS: meal prep, cooking, cleaning,  driving, shopping, community activity, and volunteering  PERSONAL FACTORS: Time since onset of injury/illness/exacerbation are also affecting patient's functional outcome.   REHAB POTENTIAL: Good  CLINICAL DECISION MAKING: Evolving/moderate complexity  EVALUATION COMPLEXITY: Moderate   GOALS: Goals reviewed with patient? Yes  SHORT TERM GOALS: Target date: 4th OT Rx visit   Pt will demonstrate understanding of lymphedema precautions and prevention strategies with modified independence using a printed reference to identify at least 5 precautions and discussing how s/he may implement them into daily life to reduce risk of progression with extra time. Baseline:Max A Goal status: 11/20/23 GOAL MET  2.  Pt will be able to apply multilayer, knee length, gradient, compression wraps to one leg at a time with max caregiver assistance to decrease limb volume, to limit infection risk, and to limit lymphedema progression.  Baseline: Dependent Goal status: 11/13/23 GOAL MET  LONG TERM GOALS: Target date: 12/12/22  Given this patient's Intake score of TBD % on the functional outcomes FOTO tool, patient will experience an increase in function of 5 points to improve basic and instrumental ADLs performance, including lymphedema self-care.  Baseline: Max  A Goal status: GOAL DEFERRED. FOTO tool discontinued.  2.  Given this patient's Intake score of 64.71 on the Lymphedema Life Impact Scale (LLIS), patient will experience a reduction of at least 5 points in her perceived level of functional impairment resulting from lymphedema to improve functional performance and quality of life (QOL). Baseline: 64.71% Goal status: PROGRESSING  3.  Pt will achieve at least a 10% volume reduction in B legs to return limb to typical size and shape, to limit infection risk and LE progression, to decrease pain, to improve function. Baseline: Dependent Goal status: PARTIALLY MET FOR L LEG WITH 5. 14% REDUCTION MEASURED ON  12/13/23 (15TH VISIT)  4.  Pt will obtain proper compression garments/devices and achieve modified independence (extra time + assistive devices) with donning/doffing to optimize limb volume reductions and limit LE progression over time. Baseline:  Goal status: ONGOING PLAN:  OT FREQUENCY: 2x/week  OT DURATION: 12 weeks  PLANNED INTERVENTIONS: 97110-Therapeutic exercises, 97530- Therapeutic activity, 97535- Self Care, 95638- Manual therapy, Patient/Family education, Manual lymph drainage, Compression bandaging, DME instructions, and fitting with compression garments  Custom-made gradient compression garments and HOS devices are medically necessary because they are uniquely sized and shaped to fit the exact dimensions of the affected extremities, and to provide appropriate medical grade, graduated compression essential for optimally managing chronic, progressive lymphedema. Multiple custom compression garments are needed to ensure proper hygiene to limit infection risk. Custom compression garments should be replaced q 3-6 months When worn consistently for optimal lipo-lymphedema self-management over time. HOS devices, medically necessary to limit fibrosis buildup in tissue, should be replaced q 2 years and PRN when worn out.     Pt should be fit:  3 Left and 3 Right, custom, Jobst, flat knit, Elvarex Classic, ccl 2 or 3, knee length compression stockings with open toes to limit and control chronic leg swelling, to improve lymphatic circulation, to reduce infection risk, to limit formation of tissue fibrosis and to limit progression of lymphedema over time. Custom compression garments should be replaced q 3-6 months when worn consistently for optimal lymphedema self-management.   Pt should be fit with 1 R and 1 L custom, Jobst Relax convoluted Hours-of-Sleep (HOS)  compression device to counteract fluid accumulation and fibrosis formation leading to lymphedema progression. at night. HOS devices,  medically necessary to limit fibrosis buildup in tissue, should be replaced q 2 years and PRN when worn out.    PLAN FOR NEXT SESSION:  LLE MLD as established using functional inguinal LN and deep abdominal lymphatics. Multilayer compression bandaging to L leg using 1 each 8, 10 and 12 cm wide x 5 meters long short stretch wrap over single layer of rosidal foam and cotton stockinett using gradient techniques. Pt instructed to remove wraps and report symptoms to her doctor if she experiences acute pain in the limb, signs/symptoms of infection, or atypical sob.  Pt / family edu for LE self care: simple self MLD leg sequence  Loel Dubonnet, MS, OTR/L, CLT-LANA 01/12/24 9:53 AM

## 2024-01-15 ENCOUNTER — Ambulatory Visit: Payer: Self-pay | Admitting: Occupational Therapy

## 2024-01-15 DIAGNOSIS — I890 Lymphedema, not elsewhere classified: Secondary | ICD-10-CM | POA: Diagnosis not present

## 2024-01-15 NOTE — Therapy (Signed)
 OUTPATIENT OCCUPATIONAL THERAPY TREATMENT NOTE  LOWER EXTREMITY LYMPHEDEMA Patient Name: [name] MRN: [id number] DOB:1956/10/07, 68 y.o., female Today's Date: 01/15/2024  REPORTING PERIOD:   END OF SESSION:   OT End of Session - 01/15/24 1427     Visit Number 16    Number of Visits 36    Date for OT Re-Evaluation 03/20/24    OT Start Time 0210    OT Stop Time 0317    OT Time Calculation (min) 67 min    Activity Tolerance Patient tolerated treatment well;No increased pain    Behavior During Therapy [facility] for tasks assessed/performed             OT End of Session - 01/15/24 1427     Visit Number 16    Number of Visits 36    Date for OT Re-Evaluation 03/20/24    OT Start Time 0210    OT Stop Time 0317    OT Time Calculation (min) 67 min    Activity Tolerance Patient tolerated treatment well;No increased pain    Behavior During Therapy WFL for tasks assessed/performed               Past Medical History:  Diagnosis Date   Allergy    Anemia    Anxiety    Arthritis    Chronic venous insufficiency 12/04/2015   Depression    Fibromyalgia    History of chicken pox    HTN (hypertension)    MRSA (methicillin resistant Staphylococcus aureus)    Obese    Plantar fasciitis    Pneumonia    Seasonal allergies    Sleep apnea 2012   took test in [place]-told to use cpap-could not ude   Past Surgical History:  Procedure Laterality Date   ANTERIOR CERVICAL DECOMP/DISCECTOMY FUSION  05/09/2008   C5-6, C6-7; with arthrodesis also   GASTRIC BYPASS  10/24/2000   KNEE ARTHROSCOPY  10/24/2002   rt and lt knee   ROTATOR CUFF REPAIR W/ DISTAL CLAVICLE EXCISION  01/06/2006   right   SHOULDER ARTHROSCOPY Left 10/24/2001   TONSILLECTOMY     TOTAL KNEE ARTHROPLASTY  03/29/2004   left   TOTAL KNEE ARTHROPLASTY  11/07/2003   right   TOTAL KNEE REVISION  07/30/2010   right; with lateral release   UPPER GASTROINTESTINAL ENDOSCOPY     Patient Active Problem List    Diagnosis Date Noted   Establishing care with new doctor, encounter for 12/28/2023   Venous stasis ulcer of other part of left lower leg limited to breakdown of skin with varicose veins (HCC) 12/28/2023   Stage 3a chronic kidney disease (HCC) 12/28/2023   Essential hypertension 02/14/2022   CAP (community acquired pneumonia) 06/09/2017   ADHD, 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   Insomnia    Psoriatic arthritis (HCC)    Cough    Chronic anemia 12/05/2015   Hoarding disorder 03/05/2015   Alcohol abuse 11/21/2014   Anxiety state 11/21/2014   Morbid obesity with BMI of 40.0-44.9, adult (HCC) 11/21/2014   Hematemesis 03/07/2014   Depressive disorder 12/09/2013   GAD (generalized anxiety disorder) 09/17/2013   Fibromyalgia 07/22/2013    PCP: Adela Glimpse, NP  REFERRING PROVIDER: Lucia Estelle, DPM  REFERRING DIAG: I89.0  THERAPY DIAG:  Lymphedema, not elsewhere classified  Rationale for Evaluation and Treatment: Rehabilitation  ONSET DATE: "I've had leg swelling for years. I don't know when it started. " + family hx of leg swelling-mother  SUBJECTIVE:  SUBJECTIVE STATEMENT: [name] returns to OT to commence CDT to RLE. She is accompanied by her spouse, [name]. Pt has no new complaints.  She denies LE-related leg pain. Pt reports burning in bottoms of feet is ongoing.   PERTINENT HISTORY: Pls see active problem list above  PAIN:  Are you having LE related pain? No: NPRS scale: 0/10 Pain location: feet Pain description: burning Aggravating factors: walking Relieving factors: elevation  PRECAUTIONS: Other: LYMPHEDEMA PRECAUTIONS  WEIGHT BEARING RESTRICTIONS: No  FALLS:  Has patient fallen in last 6 months? 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   OBJECTIVE: Note: Objective measures were completed at Evaluation unless otherwise noted.  COGNITION:  Overall cognitive status: frequent redirection needed during eval to stay on topic    POSTURE: WFL  LE ROM: grossly WFL  STRENGTH: grossly WFL   BLE COMPARATIVE LIMB VOLUMETRICS INITIAL 11/01/23  LANDMARK RIGHT  (dominant)  R LEG (A-D) 4296.6 ml  R THIGH (E-G) ml  R FULL LIMB (A-G) ml  Limb Volume differential (LVD)  %  Volume change since initial %  Volume change overall V  (Blank rows = not tested)  LANDMARK LEFT    L LEG (A-D) 4829.1 ml  L  THIGH (E-G) ml  L FULL LIMB (A-G) ml  Limb Volume differential (LVD)   12.4%, L>R  Volume change since initial %  Volume change overall %  (Blank rows = not tested)    9th Visit 12/06/23        LANDMARK LEFT    L LEG (A-D) 4888.8 ml  L  THIGH (E-G) ml  L FULL LIMB (A-G) ml  Limb Volume differential (LVD)     Volume change since initial  + 1.2 %  Volume change overall %  (Blank rows = not tested)    15 th Visit 01/10/24       LANDMARK LEFT    L LEG (A-D) 4637.8 ml  L  THIGH (E-G) ml  L FULL LIMB (A-G) ml  Limb Volume differential (LVD)     Volume change since last measured 12/06/23  DECREASED by 5.14%  Volume change overall %  (Blank rows = not tested)    Mild, Stage  II, 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 Obesity  Skin  Description Hyper-Keratosis Peau' de Orange Shiny Tight Fibrotic/ Indurated Fatty Doughy  Spongy/ boggy   x    Bilateral Chronic Lipo dermatosclerosis Hardened, thickened, leathery      Skin dry Flaky WNL Macerated   mildly x     Color Redness Varicosities Blanching Hemosiderin Stain Mottled   x x x  Severe bilaterally x   Odor Malodorous Yeast Fungal infection  WNL      x   Temperature Warm Cool wnl    x     Pitting Edema   1+ 2+ 3+ 4+ Non-pitting         x   Girth Symmetrical Asymmetrical  Distribution    L>R toes to popliteal     Stemmer Sign Positive Negative   +    Lymphorrhea History Of:  Present Absent     x    Wounds History Of Present Absent Venous Arterial Pressure Sheer     x        Signs of Infection Redness Warmth Erythema Acute Swelling Drainage Borders                    Sensation Light Touch Deep pressure Hypersensitivity Neuropathic pain   In Tact Impaired In tact Impaired Absent Impaired Plantar surfaces bilaterally   x  x  x      Nails WNL   Fungus nail dystrophy     x   Hair Growth Symmetrical Asymmetrical   x    Skin Creases Base of toes  Ankles   Base of Fingers knees       Abdominal pannus Thigh Lobules  Face/neck   x           TODAY'S TREATMENT:                                                                                                                                         L LEG comparative limb volumetrics LLE/LLQ MLD w Simultaneous skin L LEG multilayer gradient compression wraps  PATIENT EDUCATION:  Continued Pt/ CG edu for lymphedema self care home program throughout session. Topics include outcome of comparative limb volumetrics- starting limb volume differentials (LVDs), technology and gradient techniques used for short stretch, multilayer compression wrapping, simple self-MLD, therapeutic lymphatic pumping exercises, skin/nail care, LE precautions,. compression garment recommendations and specifications, wear and care schedule and compression garment donning / doffing w assistive  devices. Discussed progress towards all OT goals since commencing CDT. All questions answered to the Pt's satisfaction. Good return. Person educated: Patient  Education method: Explanation, Demonstration, and Handouts Education comprehension: verbalized understanding, returned demonstration, verbal cues required, and needs further education  HOME EXERCISE PROGRAM: BLE lymphatic pumping there ex using- 1 sets of 10 reps, each exercise in order-  1-2 x daily, bilaterally Simple self MLD 1 x daily Daily skin care to increase hydration, skin mobility and decrease infection risk- can be done during MLD Compression wraps 23/7 until garment fitting complete   ASSESSMENT  CLINICAL IMPRESSION: Lymphedema appears well managed between visits. Skin condition continues to improve evidenced by increase excursion and improved hydration. Very dark hemosiderin stain is unchanged since commencing OT for CDT. We continued MLD to LLE with simultaneous skin care. Pt tolerated reapplication of compression wraps without increased pain. Throughout session integrated Pt and family edu for simple self MLD and lymphatic pumping therex using demonstration and verbal cuing.Reviewed simple self MLD while performing manual therapy. Pt agrees with plan to complete anatomical measurements for custom compression garments next session. Cont as per POC.   (Initial Eval  09/13/23: Gael Delude is a 68 y.o. 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 Pt denies hx of known, chronic limb swelling in her family. She does not utilize compression garments, or a vasopneumatic compression "pump" as conservative measures. Chronic limb swelling and associated pain limits functional performance in  all occupational domains, including functional ambulation and mobility, self-care and basic and instrumental ADLs. It limits participation in leisure pursuits, productive activities and social participle[ation.   Pt will benefit from  skilled Occupational Therapy for both  Intensive and Self -management phases of Complete Decongestive Therapy (CDT) to reduce limb swelling and associated pain, to limit progression, to reduce infection risk , and to increase functional performance, social participation and quality of life. CDT include manual lymphatic drainage (MLD), skin care, therapeutic exercise and compression therapies- multilayer bandaging initially, one limb at a time,  and then appropriate compression garments, or alternatives in the final phase. Pt and caregivers who assist are trained to perform a;; aspects of Lymphedema self-care top ensure optimal self management over time. Without skilled  OT for CDT, further progression of lymphedema is certain and further functional decline is expected.)   OBJECTIVE IMPAIRMENTS: decreased activity tolerance, decreased knowledge of condition, decreased knowledge of use of DME, increased edema, pain, and chronic leg swelling .   ACTIVITY LIMITATIONS: limited functional ambulation and functional mobility 2/2 lymphedema -related foot and leg pain; standing, squatting, stairs, bathing, toileting, dressing, and hygiene/grooming  PARTICIPATION LIMITATIONS: meal prep, cooking, cleaning, driving, shopping, community activity, and volunteering  PERSONAL FACTORS: Time since onset of injury/illness/exacerbation are also affecting patient's functional outcome.   REHAB POTENTIAL: Good  CLINICAL DECISION MAKING: Evolving/moderate complexity  EVALUATION COMPLEXITY: Moderate   GOALS: Goals reviewed with patient? Yes  SHORT TERM GOALS: Target date: 4th OT Rx visit   Pt will demonstrate understanding of lymphedema precautions and prevention strategies with  modified independence using a printed reference to identify at least 5 precautions and discussing how s/he may implement them into daily life to reduce risk of progression with extra time. Baseline:Max A Goal status: 11/20/23 GOAL MET  2.  Pt will be able to apply multilayer, knee length, gradient, compression wraps to one leg at a time with max caregiver assistance to decrease limb volume, to limit infection risk, and to limit lymphedema progression.  Baseline: Dependent Goal status: 11/13/23 GOAL MET  LONG TERM GOALS: Target date: 12/12/22  Given this patient's Intake score of TBD % on the functional outcomes FOTO tool, patient will experience an increase in function of 5 points to improve basic and instrumental ADLs performance, including lymphedema self-care.  Baseline: Max A Goal status: GOAL DEFERRED. FOTO tool discontinued.  2.  Given this patient's Intake score of 64.71 on the Lymphedema Life Impact Scale (LLIS), patient will experience a reduction of at least 5 points in her perceived level of functional impairment resulting from lymphedema to improve functional performance and quality of life (QOL). Baseline: 64.71% Goal status: PROGRESSING  3.  Pt will achieve at least a 10% volume reduction in B legs to return limb to typical size and shape, to limit infection risk and LE progression, to decrease pain, to improve function. Baseline: Dependent Goal status: PARTIALLY MET FOR L LEG WITH 5. 14% REDUCTION MEASURED ON 12/13/23 (15TH VISIT)  4.  Pt  will obtain proper compression garments/devices and achieve modified independence (extra time + assistive devices) with donning/doffing to optimize limb volume reductions and limit LE progression over time. Baseline:  Goal status: ONGOING PLAN:  OT FREQUENCY: 2x/week  OT DURATION: 12 weeks  PLANNED INTERVENTIONS: 97110-Therapeutic exercises, 97530- Therapeutic activity, 97535- Self Care, 16109- Manual therapy, Patient/Family education,  Manual lymph drainage, Compression bandaging, DME instructions, and fitting with compression garments  Custom-made gradient compression garments and HOS devices are medically necessary because they are uniquely sized and shaped to fit the exact dimensions of the affected extremities, and to provide appropriate medical grade, graduated compression essential for optimally managing chronic, progressive lymphedema. Multiple custom compression garments are needed to ensure proper hygiene to limit infection risk. Custom compression garments should be replaced q 3-6 months When worn consistently for optimal lipo-lymphedema self-management over time. HOS devices, medically necessary to limit fibrosis buildup in tissue, should be replaced q 2 years and PRN when worn out.     Pt should be fit:  3 Left and 3 Right, custom, Jobst, flat knit, Elvarex Classic, ccl 2 or 3, knee length compression stockings with open toes to limit and control chronic leg swelling, to improve lymphatic circulation, to reduce infection risk, to limit formation of tissue fibrosis and to limit progression of lymphedema over time. Custom compression garments should be replaced q 3-6 months when worn consistently for optimal lymphedema self-management.   Pt should be fit with 1 R and 1 L custom, Jobst Relax convoluted Hours-of-Sleep (HOS)  compression device to counteract fluid accumulation and fibrosis formation leading to lymphedema progression. at night. HOS devices, medically necessary to limit fibrosis buildup in tissue, should be replaced q 2 years and PRN when worn out.    PLAN FOR NEXT SESSION:  LLE MLD as established using functional inguinal LN and deep abdominal lymphatics. Multilayer compression bandaging to L leg using 1 each 8, 10 and 12 cm wide x 5 meters long short stretch wrap over single layer of rosidal foam and cotton stockinett using gradient techniques. Pt instructed to remove wraps and report symptoms to her doctor if  she experiences acute pain in the limb, signs/symptoms of infection, or atypical sob.  Pt / family edu for LE self care: simple self MLD leg sequence  Loel Dubonnet, MS, OTR/L, CLT-LANA 01/15/24 3:25 PM

## 2024-01-17 ENCOUNTER — Ambulatory Visit: Payer: Self-pay | Admitting: Occupational Therapy

## 2024-01-18 ENCOUNTER — Ambulatory Visit: Admitting: Occupational Therapy

## 2024-01-22 ENCOUNTER — Ambulatory Visit: Payer: Self-pay | Admitting: Occupational Therapy

## 2024-01-23 ENCOUNTER — Ambulatory Visit: Attending: Podiatry | Admitting: Occupational Therapy

## 2024-01-23 DIAGNOSIS — I890 Lymphedema, not elsewhere classified: Secondary | ICD-10-CM | POA: Diagnosis not present

## 2024-01-23 NOTE — Therapy (Signed)
 OUTPATIENT OCCUPATIONAL THERAPY TREATMENT NOTE  LOWER EXTREMITY LYMPHEDEMA Patient Name: [name] MRN: [id number] DOB:[birth year], 68 y.o., female Today's Date: 01/23/2024  REPORTING PERIOD:   END OF SESSION:     OT End of Session - 01/23/24 1518     Visit Number 17    Number of Visits 36    Date for OT Re-Evaluation 03/20/24    OT Start Time 0212    OT Stop Time 0318    OT Time Calculation (min) 66 min    Activity Tolerance Patient tolerated treatment well;No increased pain    Behavior During Therapy WFL for tasks assessed/performed               Past Medical History:  Diagnosis Date   Allergy    Anemia    Anxiety    Arthritis    Chronic venous insufficiency 12/04/2015   Depression    Fibromyalgia    History of chicken pox    HTN (hypertension)    MRSA (methicillin resistant Staphylococcus aureus)    Obese    Plantar fasciitis    Pneumonia    Seasonal allergies    Sleep apnea 2012   took test in [place]-told to use cpap-could not ude   Past Surgical History:  Procedure Laterality Date   ANTERIOR CERVICAL DECOMP/DISCECTOMY FUSION  05/09/2008   C5-6, C6-7; with arthrodesis also   GASTRIC BYPASS  10/24/2000   KNEE ARTHROSCOPY  10/24/2002   rt and lt knee   ROTATOR CUFF REPAIR W/ DISTAL CLAVICLE EXCISION  01/06/2006   right   SHOULDER ARTHROSCOPY Left 10/24/2001   TONSILLECTOMY     TOTAL KNEE ARTHROPLASTY  03/29/2004   left   TOTAL KNEE ARTHROPLASTY  11/07/2003   right   TOTAL KNEE REVISION  07/30/2010   right; with lateral release   UPPER GASTROINTESTINAL ENDOSCOPY     Patient Active Problem List   Diagnosis Date Noted   Establishing care with new doctor, encounter for 12/28/2023   Venous stasis ulcer of other part of left lower leg limited to breakdown of skin with varicose veins (HCC) 12/28/2023   Stage 3a chronic kidney disease (HCC) 12/28/2023   Essential hypertension 02/14/2022   CAP (community acquired pneumonia) 06/09/2017   ADHD,  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   Insomnia    Psoriatic arthritis (HCC)    Cough    Chronic anemia 12/05/2015   Hoarding disorder 03/05/2015   Alcohol abuse 11/21/2014   Anxiety state 11/21/2014   Morbid obesity with BMI of 40.0-44.9, adult (HCC) 11/21/2014   Hematemesis 03/07/2014   Depressive disorder 12/09/2013   GAD (generalized anxiety disorder) 09/17/2013   Fibromyalgia 07/22/2013    PCP: Adela Glimpse, NP  REFERRING PROVIDER: Lucia Estelle, DPM  REFERRING DIAG: I89.0  THERAPY DIAG:  Lymphedema, not elsewhere classified  Rationale for Evaluation and Treatment: Rehabilitation  ONSET DATE: "I've had leg swelling for years. I don't know when it started. " + family hx of leg swelling-mother  SUBJECTIVE:  SUBJECTIVE STATEMENT: SHANTI AGRESTI returns to OT to commence CDT to RLE. She is accompanied by her spouse, [name]. Pt has no new complaints.  She denies LE-related leg pain. Pt reports burning in bottoms of feet is ongoing.   PERTINENT HISTORY: Pls see active problem list above  PAIN:  Are you having LE related pain? No: NPRS scale: 0/10 Pain location: feet Pain description: burning Aggravating factors: walking Relieving factors: elevation  PRECAUTIONS: Other: LYMPHEDEMA PRECAUTIONS  WEIGHT BEARING RESTRICTIONS: No  FALLS:  Has patient fallen in last 6 months? 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   OBJECTIVE: Note: Objective measures were completed at Evaluation unless otherwise noted.  COGNITION:  Overall cognitive status: frequent redirection needed during eval to stay on topic    POSTURE: WFL  LE ROM: grossly WFL  STRENGTH: grossly WFL   BLE COMPARATIVE LIMB VOLUMETRICS INITIAL 11/01/23  LANDMARK RIGHT  (dominant)  R LEG (A-D) 4296.6 ml  R THIGH (E-G) ml  R FULL LIMB (A-G) ml  Limb Volume differential (LVD)  %  Volume change since initial %  Volume change overall V  (Blank rows = not tested)  LANDMARK LEFT    L LEG (A-D) 4829.1 ml  L  THIGH (E-G) ml  L FULL LIMB (A-G) ml  Limb Volume differential (LVD)   12.4%, L>R  Volume change since initial %  Volume change overall %  (Blank rows = not tested)    9th Visit 12/06/23        LANDMARK LEFT    L LEG (A-D) 4888.8 ml  L  THIGH (E-G) ml  L FULL LIMB (A-G) ml  Limb Volume differential (LVD)     Volume change since initial  + 1.2 %  Volume change overall %  (Blank rows = not tested)    15 th Visit 01/10/24       LANDMARK LEFT    L LEG (A-D) 4637.8 ml  L  THIGH (E-G) ml  L FULL LIMB (A-G) ml  Limb Volume differential (LVD)     Volume change since last measured 12/06/23  DECREASED by 5.14%  Volume change overall %  (Blank rows = not tested)    Mild, Stage  II, 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 Obesity  Skin  Description Hyper-Keratosis Peau' de Orange Shiny Tight Fibrotic/ Indurated Fatty Doughy Spongy/ boggy   x    Bilateral Chronic Lipo dermatosclerosis Hardened, thickened, leathery      Skin dry Flaky WNL Macerated   mildly x     Color Redness Varicosities Blanching Hemosiderin Stain Mottled   x x x  Severe bilaterally x   Odor Malodorous Yeast Fungal infection  WNL      x   Temperature Warm Cool wnl    x     Pitting  Edema   1+ 2+ 3+ 4+ Non-pitting         x   Girth Symmetrical Asymmetrical  Distribution    L>R toes to popliteal     Stemmer Sign Positive Negative   +    Lymphorrhea History Of:  Present Absent     x    Wounds History Of Present Absent Venous Arterial Pressure Sheer     x        Signs of Infection Redness Warmth Erythema Acute Swelling Drainage Borders                    Sensation Light Touch Deep pressure Hypersensitivity Neuropathic pain   In Tact Impaired In tact Impaired Absent Impaired Plantar surfaces bilaterally   x  x  x      Nails WNL   Fungus nail dystrophy     x   Hair Growth Symmetrical Asymmetrical   x    Skin Creases Base of toes  Ankles   Base of Fingers knees       Abdominal pannus Thigh Lobules  Face/neck   x           TODAY'S TREATMENT:                                                                                                                                         L LEG comparative limb volumetrics LLE/LLQ MLD w Simultaneous skin L LEG multilayer gradient compression wraps  PATIENT EDUCATION:  Continued Pt/ CG edu for lymphedema self care home program throughout session. Topics include outcome of comparative limb volumetrics- starting limb volume differentials (LVDs), technology and gradient techniques used for short stretch, multilayer compression wrapping, simple self-MLD, therapeutic lymphatic pumping exercises, skin/nail care, LE precautions,. compression garment recommendations and specifications, wear and care schedule and compression garment donning / doffing w assistive devices. Discussed progress towards all OT goals since commencing CDT. All questions answered to the Pt's satisfaction. Good return. Person educated: Patient  Education method: Explanation, Demonstration, and Handouts Education comprehension: verbalized understanding, returned demonstration, verbal cues required, and needs further  education  HOME EXERCISE PROGRAM: BLE lymphatic pumping there ex using- 1 sets of 10 reps, each exercise in order-  1-2 x daily, bilaterally Simple self MLD 1 x daily Daily skin care to increase hydration, skin mobility and decrease infection risk- can be done during MLD Compression wraps 23/7 until garment fitting complete   ASSESSMENT  CLINICAL IMPRESSION:  Very dark hemosiderin stain is unchanged since commencing OT for CDT. We continued MLD to LLE with simultaneous skin care. Pt tolerated reapplication of compression wraps without increased pain. Throughout session integrated Pt and family edu for simple self MLD and lymphatic pumping therex using demonstration and verbal cuing.Reviewed simple self MLD while performing manual therapy. Cont as per POC.   (Initial Eval 09/13/23: Adalis Gatti is a 68 y.o. female presenting with mild, BLE lymphedema 2.2 CVI and Obesity which contributes to and is affected by multiple co morbidities , including arthritis, fibromyalgia,  HTN, obesity, plantar fasciitis and sleep apnea. 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 Pt denies hx of known, chronic limb swelling in her family. She does not utilize compression garments, or a vasopneumatic compression "pump" as conservative measures. Chronic limb swelling and associated pain limits functional performance in all occupational domains, including functional ambulation and mobility, self-care and basic and instrumental ADLs. It limits participation in leisure pursuits, productive activities and social participle[ation.   Pt will benefit from  skilled Occupational Therapy for both  Intensive and Self -management phases of Complete Decongestive Therapy (CDT) to reduce limb swelling and associated pain, to limit progression, to reduce infection risk , and to increase functional performance, social participation and quality of life. CDT include manual lymphatic drainage (MLD), skin  care, therapeutic exercise and compression therapies- multilayer bandaging initially, one limb at a time,  and then appropriate compression garments, or alternatives in the final phase. Pt and caregivers who assist are trained to perform a;; aspects of Lymphedema self-care top ensure optimal self management over time. Without skilled  OT for CDT, further progression of lymphedema is certain and further functional decline is expected.)   OBJECTIVE IMPAIRMENTS: decreased activity tolerance, decreased knowledge of condition, decreased knowledge of use of DME, increased edema, pain, and chronic leg swelling .   ACTIVITY LIMITATIONS: limited functional ambulation and functional mobility 2/2 lymphedema -related foot and leg pain; standing, squatting, stairs, bathing, toileting, dressing, and hygiene/grooming  PARTICIPATION LIMITATIONS: meal prep, cooking, cleaning, driving, shopping, community activity, and volunteering  PERSONAL FACTORS: Time since onset of injury/illness/exacerbation are also affecting patient's functional outcome.   REHAB POTENTIAL: Good  CLINICAL DECISION MAKING: Evolving/moderate complexity  EVALUATION COMPLEXITY: Moderate   GOALS: Goals reviewed with patient? Yes  SHORT TERM GOALS: Target date: 4th OT Rx visit   Pt will demonstrate understanding of lymphedema precautions and prevention strategies with modified independence using a printed reference to identify at least 5 precautions and discussing how s/he may implement them into daily life to reduce risk of progression with extra time. Baseline:Max A Goal status: 11/20/23 GOAL MET  2.  Pt will be able to apply multilayer, knee length, gradient, compression wraps to one leg at a time with max caregiver assistance to decrease limb volume, to limit infection risk, and to limit lymphedema progression.  Baseline: Dependent Goal status: 11/13/23 GOAL MET  LONG TERM GOALS: Target date: 12/12/22  Given this patient's Intake  score of TBD % on the functional outcomes FOTO tool, patient will experience an increase in function of 5 points to improve basic and instrumental ADLs performance, including lymphedema self-care.  Baseline: Max A Goal status: GOAL DEFERRED. FOTO tool discontinued.  2.  Given this patient's Intake score of 64.71 on the Lymphedema Life Impact Scale (LLIS), patient will experience a reduction of at least 5 points in her perceived level of functional impairment resulting from lymphedema to improve functional performance and quality of life (QOL). Baseline: 64.71% Goal status: PROGRESSING  3.  Pt will achieve at least a 10% volume reduction in B legs to return limb to typical size and shape, to limit infection risk and LE progression, to decrease pain, to improve function. Baseline: Dependent Goal status: PARTIALLY MET FOR L LEG WITH 5. 14% REDUCTION MEASURED ON 12/13/23 (15TH VISIT)  4.  Pt will obtain proper compression garments/devices and achieve modified independence (extra time + assistive devices) with donning/doffing to optimize limb volume reductions and limit LE progression over time. Baseline:  Goal status:  ONGOING PLAN:  OT FREQUENCY: 2x/week  OT DURATION: 12 weeks  PLANNED INTERVENTIONS: 97110-Therapeutic exercises, 97530- Therapeutic activity, 97535- Self Care, 16109- Manual therapy, Patient/Family education, Manual lymph drainage, Compression bandaging, DME instructions, and fitting with compression garments  Custom-made gradient compression garments and HOS devices are medically necessary because they are uniquely sized and shaped to fit the exact dimensions of the affected extremities, and to provide appropriate medical grade, graduated compression essential for optimally managing chronic, progressive lymphedema. Multiple custom compression garments are needed to ensure proper hygiene to limit infection risk. Custom compression garments should be replaced q 3-6 months When worn  consistently for optimal lipo-lymphedema self-management over time. HOS devices, medically necessary to limit fibrosis buildup in tissue, should be replaced q 2 years and PRN when worn out.     Pt should be fit:  3 Left and 3 Right, custom, Jobst, flat knit, Elvarex Classic, ccl 2 or 3, knee length compression stockings with open toes to limit and control chronic leg swelling, to improve lymphatic circulation, to reduce infection risk, to limit formation of tissue fibrosis and to limit progression of lymphedema over time. Custom compression garments should be replaced q 3-6 months when worn consistently for optimal lymphedema self-management.   Pt should be fit with 1 R and 1 L custom, Jobst Relax convoluted Hours-of-Sleep (HOS)  compression device to counteract fluid accumulation and fibrosis formation leading to lymphedema progression. at night. HOS devices, medically necessary to limit fibrosis buildup in tissue, should be replaced q 2 years and PRN when worn out.    PLAN FOR NEXT SESSION:  LLE MLD as established using functional inguinal LN and deep abdominal lymphatics. Multilayer compression bandaging to L leg using 1 each 8, 10 and 12 cm wide x 5 meters long short stretch wrap over single layer of rosidal foam and cotton stockinett using gradient techniques. Pt instructed to remove wraps and report symptoms to her doctor if she experiences acute pain in the limb, signs/symptoms of infection, or atypical sob.  Pt / family edu for LE self care: simple self MLD leg sequence  Loel Dubonnet, MS, OTR/L, CLT-LANA 01/23/24 3:20 PM

## 2024-01-24 ENCOUNTER — Ambulatory Visit: Payer: Self-pay | Admitting: Occupational Therapy

## 2024-01-24 ENCOUNTER — Other Ambulatory Visit: Payer: Self-pay | Admitting: General Practice

## 2024-01-25 ENCOUNTER — Ambulatory Visit: Admitting: Occupational Therapy

## 2024-01-25 DIAGNOSIS — I890 Lymphedema, not elsewhere classified: Secondary | ICD-10-CM | POA: Diagnosis not present

## 2024-01-25 NOTE — Therapy (Signed)
 OUTPATIENT OCCUPATIONAL THERAPY TREATMENT NOTE  LOWER EXTREMITY LYMPHEDEMA Patient Name: [name] MRN: [id number] DOB:March 30, 1956, 68 y.o., female Today's Date: 01/25/2024  REPORTING PERIOD:   END OF SESSION:     OT End of Session - 01/25/24 1418     Visit Number 18    Number of Visits 36    Date for OT Re-Evaluation 03/20/24    OT Start Time 0210    Activity Tolerance Patient tolerated treatment well;No increased pain    Behavior During Therapy WFL for tasks assessed/performed               Past Medical History:  Diagnosis Date   Allergy    Anemia    Anxiety    Arthritis    Chronic venous insufficiency 12/04/2015   Depression    Fibromyalgia    History of chicken pox    HTN (hypertension)    MRSA (methicillin resistant Staphylococcus aureus)    Obese    Plantar fasciitis    Pneumonia    Seasonal allergies    Sleep apnea 2012   took test in [place]-told to use cpap-could not ude   Past Surgical History:  Procedure Laterality Date   ANTERIOR CERVICAL DECOMP/DISCECTOMY FUSION  05/09/2008   C5-6, C6-7; with arthrodesis also   GASTRIC BYPASS  10/24/2000   KNEE ARTHROSCOPY  10/24/2002   rt and lt knee   ROTATOR CUFF REPAIR W/ DISTAL CLAVICLE EXCISION  01/06/2006   right   SHOULDER ARTHROSCOPY Left 10/24/2001   TONSILLECTOMY     TOTAL KNEE ARTHROPLASTY  03/29/2004   left   TOTAL KNEE ARTHROPLASTY  11/07/2003   right   TOTAL KNEE REVISION  07/30/2010   right; with lateral release   UPPER GASTROINTESTINAL ENDOSCOPY     Patient Active Problem List   Diagnosis Date Noted   Establishing care with new doctor, encounter for 12/28/2023   Venous stasis ulcer of other part of left lower leg limited to breakdown of skin with varicose veins (HCC) 12/28/2023   Stage 3a chronic kidney disease (HCC) 12/28/2023   Essential hypertension 02/14/2022   CAP (community acquired pneumonia) 06/09/2017   ADHD, 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   Insomnia     Psoriatic arthritis (HCC)    Cough    Chronic anemia 12/05/2015   Hoarding disorder 03/05/2015   Alcohol abuse 11/21/2014   Anxiety state 11/21/2014   Morbid obesity with BMI of 40.0-44.9, adult (HCC) 11/21/2014   Hematemesis 03/07/2014   Depressive disorder 12/09/2013   GAD (generalized anxiety disorder) 09/17/2013   Fibromyalgia 07/22/2013    PCP: Adela Glimpse, NP  REFERRING PROVIDER: Lucia Estelle, DPM  REFERRING DIAG: I89.0  THERAPY DIAG:  Lymphedema, not elsewhere classified  Rationale for Evaluation and Treatment: Rehabilitation  ONSET DATE: "I've had leg swelling for years. I don't know when it started. " + family hx of leg swelling-mother  SUBJECTIVE:  SUBJECTIVE STATEMENT: GIFT RUECKERT returns to OT to commence CDT to RLE. She is accompanied by her spouse, [name]. Pt has no new complaints.  She denies LE-related leg pain. Pt reports burning in bottoms of feet is ongoing.   PERTINENT HISTORY: Pls see active problem list above  PAIN:  Are you having LE related pain? No: NPRS scale: 0/10 Pain location: feet Pain description: burning Aggravating factors: walking Relieving factors: elevation  PRECAUTIONS: Other: LYMPHEDEMA PRECAUTIONS  WEIGHT BEARING RESTRICTIONS: No  FALLS:  Has patient fallen in last 6 months? 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   OBJECTIVE: Note: Objective measures were completed at Evaluation unless otherwise noted.  COGNITION:  Overall cognitive status: frequent redirection needed during eval to stay on topic    POSTURE: WFL  LE ROM: grossly WFL  STRENGTH: grossly WFL   BLE COMPARATIVE LIMB VOLUMETRICS INITIAL 11/01/23  LANDMARK RIGHT  (dominant)  R LEG (A-D) 4296.6 ml  R THIGH (E-G) ml  R FULL LIMB (A-G) ml  Limb Volume differential (LVD)  %  Volume change since initial %  Volume change overall V  (Blank rows = not tested)  LANDMARK LEFT    L LEG (A-D) 4829.1 ml  L  THIGH (E-G) ml  L FULL LIMB (A-G) ml  Limb Volume differential (LVD)   12.4%, L>R  Volume change since initial %  Volume change overall %  (Blank rows = not tested)    9th Visit 12/06/23        LANDMARK LEFT    L LEG (A-D) 4888.8 ml  L  THIGH (E-G) ml  L FULL LIMB (A-G) ml  Limb Volume differential (LVD)     Volume change since initial  + 1.2 %  Volume change overall %  (Blank rows = not tested)    15 th Visit 01/10/24       LANDMARK LEFT    L LEG (A-D) 4637.8 ml  L  THIGH (E-G) ml  L FULL LIMB (A-G) ml  Limb Volume differential (LVD)     Volume change since last measured 12/06/23  DECREASED by 5.14%  Volume change overall %  (Blank rows = not tested)   20th Visit- BLE COMPARATIVE LIMB VOLUMETRICS  TBA  LANDMARK RIGHT  (dominant)  R LEG (A-D)  ml  R THIGH (E-G) ml  R FULL LIMB (A-G) ml  Limb Volume differential (LVD)  %  Volume change since initial %  Volume change overall V  (Blank rows = not tested)  LANDMARK LEFT    L LEG (A-D)  ml  L  THIGH (E-G) ml  L FULL LIMB (A-G) ml  Limb Volume differential (LVD)     Volume change since initial %  Volume change overall %  (Blank rows = not tested)    Mild, Stage  II, 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 Obesity  Skin  Description Hyper-Keratosis Peau' de  Orange Shiny Tight Fibrotic/ Indurated Fatty Doughy Spongy/ boggy   x    Bilateral  Chronic Lipo dermatosclerosis Hardened, thickened, leathery      Skin dry Flaky WNL Macerated   mildly x     Color Redness Varicosities Blanching Hemosiderin Stain Mottled   x x x  Severe bilaterally x   Odor Malodorous Yeast Fungal infection  WNL      x   Temperature Warm Cool wnl    x     Pitting Edema   1+ 2+ 3+ 4+ Non-pitting         x   Girth Symmetrical Asymmetrical                   Distribution    L>R toes to popliteal     Stemmer Sign Positive Negative   +    Lymphorrhea History Of:  Present Absent     x    Wounds History Of Present Absent Venous Arterial Pressure Sheer     x        Signs of Infection Redness Warmth Erythema Acute Swelling Drainage Borders                    Sensation Light Touch Deep pressure Hypersensitivity Neuropathic pain   In Tact Impaired In tact Impaired Absent Impaired Plantar surfaces bilaterally   x  x  x      Nails WNL   Fungus nail dystrophy     x   Hair Growth Symmetrical Asymmetrical   x    Skin Creases Base of toes  Ankles   Base of Fingers knees       Abdominal pannus Thigh Lobules  Face/neck   x           TODAY'S TREATMENT:                                                                                                                                         LLE/LLQ MLD w Simultaneous skin L LEG multilayer gradient compression wraps  PATIENT EDUCATION:  Continued Pt/ CG edu for lymphedema self care home program throughout session. Topics include outcome of comparative limb volumetrics- starting limb volume differentials (LVDs), technology and gradient techniques used for short stretch, multilayer compression wrapping, simple self-MLD, therapeutic lymphatic pumping exercises, skin/nail care, LE precautions,. compression garment recommendations and specifications, wear and care schedule and compression garment donning /  doffing w assistive devices. Discussed progress towards all OT goals since commencing CDT. All questions answered to the Pt's satisfaction. Good return. Person educated: Patient  Education method: Explanation, Demonstration, and Handouts Education comprehension: verbalized understanding, returned demonstration, verbal cues required, and needs further education  HOME EXERCISE PROGRAM: BLE lymphatic pumping there ex using- 1 sets of 10 reps, each exercise in order-  1-2 x daily, bilaterally Simple self MLD 1 x daily Daily skin care to increase hydration, skin mobility and decrease infection risk- can be done during MLD Compression wraps 23/7 until  garment fitting complete   ASSESSMENT  CLINICAL IMPRESSION:  Very dark hemosiderin stain is unchanged since commencing OT for CDT. We continued MLD to LLE with simultaneous skin care. Pt tolerated reapplication of compression wraps without increased pain. Reviewed simple self MLD while performing manual therapy. Pt performed neck sequence independently using correct technique. Cont as per POC.   (Initial Eval 09/13/23: Jenica Costilow is a 68 y.o. 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 Pt denies hx of known, chronic limb swelling in her family. She does not utilize compression garments, or a vasopneumatic compression "pump" as conservative measures. Chronic limb swelling and associated pain limits functional performance in all occupational domains, including functional ambulation and mobility, self-care and basic and instrumental ADLs. It limits participation in leisure pursuits, productive activities and social participle[ation.   Pt will benefit from  skilled Occupational Therapy for both  Intensive and  Self -management phases of Complete Decongestive Therapy (CDT) to reduce limb swelling and associated pain, to limit progression, to reduce infection risk , and to increase functional performance, social participation and quality of life. CDT include manual lymphatic drainage (MLD), skin care, therapeutic exercise and compression therapies- multilayer bandaging initially, one limb at a time,  and then appropriate compression garments, or alternatives in the final phase. Pt and caregivers who assist are trained to perform a;; aspects of Lymphedema self-care top ensure optimal self management over time. Without skilled  OT for CDT, further progression of lymphedema is certain and further functional decline is expected.)   OBJECTIVE IMPAIRMENTS: decreased activity tolerance, decreased knowledge of condition, decreased knowledge of use of DME, increased edema, pain, and chronic leg swelling .   ACTIVITY LIMITATIONS: limited functional ambulation and functional mobility 2/2 lymphedema -related foot and leg pain; standing, squatting, stairs, bathing, toileting, dressing, and hygiene/grooming  PARTICIPATION LIMITATIONS: meal prep, cooking, cleaning, driving, shopping, community activity, and volunteering  PERSONAL FACTORS: Time since onset of injury/illness/exacerbation are also affecting patient's functional outcome.   REHAB POTENTIAL: Good  CLINICAL DECISION MAKING: Evolving/moderate complexity  EVALUATION COMPLEXITY: Moderate   GOALS: Goals reviewed with patient? Yes  SHORT TERM GOALS: Target date: 4th OT Rx visit   Pt will demonstrate understanding of lymphedema precautions and prevention strategies with modified independence using a printed reference to identify at least 5 precautions and discussing how s/he may implement them into daily life to reduce risk of progression with extra time. Baseline:Max A Goal status: 11/20/23 GOAL MET  2.  Pt will be able to apply multilayer, knee length,  gradient, compression wraps to one leg at a time with max caregiver assistance to decrease limb volume, to limit infection risk, and to limit lymphedema progression.  Baseline: Dependent Goal status: 11/13/23 GOAL MET  LONG TERM GOALS: Target date: 12/12/22  Given this patient's Intake score of TBD % on the functional outcomes FOTO tool, patient will experience an increase in function of 5 points to improve basic and instrumental ADLs performance, including lymphedema self-care.  Baseline: Max A Goal status: GOAL DEFERRED. FOTO tool discontinued.  2.  Given this patient's Intake score of 64.71 on the Lymphedema Life Impact Scale (LLIS), patient will experience a reduction of at least 5 points in her perceived level of functional impairment resulting from lymphedema to improve functional performance and quality of life (QOL).  Baseline: 64.71% Goal status: PROGRESSING  3.  Pt will achieve at least a 10% volume reduction in B legs to return limb to typical size and shape, to limit infection risk and LE progression, to decrease pain, to improve function. Baseline: Dependent Goal status: PARTIALLY MET FOR L LEG WITH 5. 14% REDUCTION MEASURED ON 12/13/23 (15TH VISIT)  4.  Pt will obtain proper compression garments/devices and achieve modified independence (extra time + assistive devices) with donning/doffing to optimize limb volume reductions and limit LE progression over time. Baseline:  Goal status: ONGOING PLAN:  OT FREQUENCY: 2x/week  OT DURATION: 12 weeks  PLANNED INTERVENTIONS: 97110-Therapeutic exercises, 97530- Therapeutic activity, 97535- Self Care, 24401- Manual therapy, Patient/Family education, Manual lymph drainage, Compression bandaging, DME instructions, and fitting with compression garments  Custom-made gradient compression garments and HOS devices are medically necessary because they are uniquely sized and shaped to fit the exact dimensions of the affected extremities, and to  provide appropriate medical grade, graduated compression essential for optimally managing chronic, progressive lymphedema. Multiple custom compression garments are needed to ensure proper hygiene to limit infection risk. Custom compression garments should be replaced q 3-6 months When worn consistently for optimal lipo-lymphedema self-management over time. HOS devices, medically necessary to limit fibrosis buildup in tissue, should be replaced q 2 years and PRN when worn out.     Pt should be fit:  3 Left and 3 Right, custom, Jobst, flat knit, Elvarex Classic, ccl 2 or 3, knee length compression stockings with open toes to limit and control chronic leg swelling, to improve lymphatic circulation, to reduce infection risk, to limit formation of tissue fibrosis and to limit progression of lymphedema over time. Custom compression garments should be replaced q 3-6 months when worn consistently for optimal lymphedema self-management.   Pt should be fit with 1 R and 1 L custom, Jobst Relax convoluted Hours-of-Sleep (HOS)  compression device to counteract fluid accumulation and fibrosis formation leading to lymphedema progression. at night. HOS devices, medically necessary to limit fibrosis buildup in tissue, should be replaced q 2 years and PRN when worn out.    PLAN FOR NEXT SESSION:  LLE MLD as established using functional inguinal LN and deep abdominal lymphatics. Multilayer compression bandaging to L leg using 1 each 8, 10 and 12 cm wide x 5 meters long short stretch wrap over single layer of rosidal foam and cotton stockinett using gradient techniques. Pt instructed to remove wraps and report symptoms to her doctor if she experiences acute pain in the limb, signs/symptoms of infection, or atypical sob.  Pt / family edu for LE self care: simple self MLD leg sequence  Loel Dubonnet, MS, OTR/L, CLT-LANA 01/25/24 2:19 PM

## 2024-01-29 ENCOUNTER — Ambulatory Visit: Payer: Self-pay | Admitting: Occupational Therapy

## 2024-01-30 ENCOUNTER — Ambulatory Visit: Admitting: Occupational Therapy

## 2024-01-30 DIAGNOSIS — I890 Lymphedema, not elsewhere classified: Secondary | ICD-10-CM

## 2024-01-30 NOTE — Therapy (Unsigned)
 OUTPATIENT OCCUPATIONAL THERAPY TREATMENT NOTE  LOWER EXTREMITY LYMPHEDEMA Patient Name: [name] MRN: [id number] DOB:1956/03/20, 68 y.o., female Today's Date: 01/30/2024  REPORTING PERIOD:   END OF SESSION:     OT End of Session - 01/30/24 1420     Visit Number 19    Number of Visits 36    Date for OT Re-Evaluation 03/20/24    OT Start Time 0210    Activity Tolerance Patient tolerated treatment well;No increased pain    Behavior During Therapy WFL for tasks assessed/performed               Past Medical History:  Diagnosis Date   Allergy    Anemia    Anxiety    Arthritis    Chronic venous insufficiency 12/04/2015   Depression    Fibromyalgia    History of chicken pox    HTN (hypertension)    MRSA (methicillin resistant Staphylococcus aureus)    Obese    Plantar fasciitis    Pneumonia    Seasonal allergies    Sleep apnea 2012   took test in [place]-told to use cpap-could not ude   Past Surgical History:  Procedure Laterality Date   ANTERIOR CERVICAL DECOMP/DISCECTOMY FUSION  05/09/2008   C5-6, C6-7; with arthrodesis also   GASTRIC BYPASS  10/24/2000   KNEE ARTHROSCOPY  10/24/2002   rt and lt knee   ROTATOR CUFF REPAIR W/ DISTAL CLAVICLE EXCISION  01/06/2006   right   SHOULDER ARTHROSCOPY Left 10/24/2001   TONSILLECTOMY     TOTAL KNEE ARTHROPLASTY  03/29/2004   left   TOTAL KNEE ARTHROPLASTY  11/07/2003   right   TOTAL KNEE REVISION  07/30/2010   right; with lateral release   UPPER GASTROINTESTINAL ENDOSCOPY     Patient Active Problem List   Diagnosis Date Noted   Establishing care with new doctor, encounter for 12/28/2023   Venous stasis ulcer of other part of left lower leg limited to breakdown of skin with varicose veins (HCC) 12/28/2023   Stage 3a chronic kidney disease (HCC) 12/28/2023   Essential hypertension 02/14/2022   CAP (community acquired pneumonia) 06/09/2017   ADHD, 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   Insomnia     Psoriatic arthritis (HCC)    Cough    Chronic anemia 12/05/2015   Hoarding disorder 03/05/2015   Alcohol abuse 11/21/2014   Anxiety state 11/21/2014   Morbid obesity with BMI of 40.0-44.9, adult (HCC) 11/21/2014   Hematemesis 03/07/2014   Depressive disorder 12/09/2013   GAD (generalized anxiety disorder) 09/17/2013   Fibromyalgia 07/22/2013    PCP: Adela Glimpse, NP  REFERRING PROVIDER: Lucia Estelle, DPM  REFERRING DIAG: I89.0  THERAPY DIAG:  Lymphedema, not elsewhere classified  Rationale for Evaluation and Treatment: Rehabilitation  ONSET DATE: "I've had leg swelling for years. I don't know when it started. " + family hx of leg swelling-mother  SUBJECTIVE:  SUBJECTIVE STATEMENT: [name] returns to OT to commence CDT to RLE. She is accompanied by her spouse, [name]. Pt has no new complaints.  She denies LE-related leg pain, but states she doesn't feel well today.   PERTINENT HISTORY: Pls see active problem list above  PAIN:  Are you having LE related pain? No: NPRS scale: 0/10 Pain location: feet Pain description: burning Aggravating factors: walking Relieving factors: elevation  PRECAUTIONS: Other: LYMPHEDEMA PRECAUTIONS  WEIGHT BEARING RESTRICTIONS: No  FALLS:  Has patient fallen in last 6 months? 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   OBJECTIVE: Note: Objective measures were completed at Evaluation unless otherwise noted.  COGNITION:  Overall cognitive status: frequent redirection needed during eval to stay on topic    POSTURE: WFL  LE ROM: grossly WFL  STRENGTH: grossly WFL   BLE COMPARATIVE LIMB VOLUMETRICS INITIAL 11/01/23  LANDMARK RIGHT  (dominant)  R LEG (A-D) 4296.6 ml  R THIGH (E-G) ml  R FULL LIMB (A-G) ml  Limb Volume differential (LVD)  %  Volume change since initial %  Volume change overall V  (Blank rows = not tested)  LANDMARK LEFT    L LEG (A-D) 4829.1 ml  L  THIGH (E-G) ml  L FULL LIMB (A-G) ml  Limb Volume differential (LVD)   12.4%, L>R  Volume change since initial %  Volume change overall %  (Blank rows = not tested)    9th Visit 12/06/23        LANDMARK LEFT    L LEG (A-D) 4888.8 ml  L  THIGH (E-G) ml  L FULL LIMB (A-G) ml  Limb Volume differential (LVD)     Volume change since initial  + 1.2 %  Volume change overall %  (Blank rows = not tested)    15 th Visit 01/10/24       LANDMARK LEFT    L LEG (A-D) 4637.8 ml  L  THIGH (E-G) ml  L FULL LIMB (A-G) ml  Limb Volume differential (LVD)     Volume change since last measured 12/06/23  DECREASED by 5.14%  Volume change overall %  (Blank rows = not tested)   20th Visit- BLE COMPARATIVE LIMB VOLUMETRICS  TBA  LANDMARK RIGHT  (dominant)  R LEG (A-D)  ml  R THIGH (E-G) ml  R FULL LIMB (A-G) ml  Limb Volume differential (LVD)  %  Volume change since initial %  Volume change overall V  (Blank rows = not tested)  LANDMARK LEFT    L LEG (A-D)  ml  L  THIGH (E-G) ml  L FULL LIMB (A-G) ml  Limb Volume differential (LVD)     Volume change since initial %  Volume change overall %  (Blank rows = not tested)    Mild, Stage  II, 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 Obesity  Skin  Description Hyper-Keratosis Peau' de Orange  Shiny Tight Fibrotic/ Indurated Fatty Doughy Spongy/ boggy   x    Bilateral Chronic Lipo  dermatosclerosis Hardened, thickened, leathery      Skin dry Flaky WNL Macerated   mildly x     Color Redness Varicosities Blanching Hemosiderin Stain Mottled   x x x  Severe bilaterally x   Odor Malodorous Yeast Fungal infection  WNL      x   Temperature Warm Cool wnl    x     Pitting Edema   1+ 2+ 3+ 4+ Non-pitting         x   Girth Symmetrical Asymmetrical                   Distribution    L>R toes to popliteal     Stemmer Sign Positive Negative   +    Lymphorrhea History Of:  Present Absent     x    Wounds History Of Present Absent Venous Arterial Pressure Sheer     x        Signs of Infection Redness Warmth Erythema Acute Swelling Drainage Borders                    Sensation Light Touch Deep pressure Hypersensitivity Neuropathic pain   In Tact Impaired In tact Impaired Absent Impaired Plantar surfaces bilaterally   x  x  x      Nails WNL   Fungus nail dystrophy     x   Hair Growth Symmetrical Asymmetrical   x    Skin Creases Base of toes  Ankles   Base of Fingers knees       Abdominal pannus Thigh Lobules  Face/neck   x           TODAY'S TREATMENT:                                                                                                                                         LLE/LLQ MLD w Simultaneous skin L LEG multilayer gradient compression wraps  PATIENT EDUCATION:  Continued Pt/ CG edu for lymphedema self care home program throughout session. Topics include outcome of comparative limb volumetrics- starting limb volume differentials (LVDs), technology and gradient techniques used for short stretch, multilayer compression wrapping, simple self-MLD, therapeutic lymphatic pumping exercises, skin/nail care, LE precautions,. compression garment recommendations and specifications, wear and care schedule and compression garment donning / doffing w  assistive devices. Discussed progress towards all OT goals since commencing CDT. All questions answered to the Pt's satisfaction. Good return. Person educated: Patient  Education method: Explanation, Demonstration, and Handouts Education comprehension: verbalized understanding, returned demonstration, verbal cues required, and needs further education  HOME EXERCISE PROGRAM: BLE lymphatic pumping there ex using- 1 sets of 10 reps, each exercise in order-  1-2 x daily, bilaterally Simple self MLD 1 x daily Daily skin care to increase hydration, skin mobility and decrease infection risk- can be done during MLD Compression wraps 23/7 until garment fitting  complete   ASSESSMENT  CLINICAL IMPRESSION:  Very dark hemosiderin stain is unchanged since commencing OT for CDT. We continued MLD to LLE with simultaneous skin care. Pt tolerated reapplication of compression wraps without increased pain. Reviewed simple self MLD while performing manual therapy. Pt performed neck sequence independently using correct technique. Cont as per POC.   (Initial Eval 09/13/23: Hanna Ra is a 68 y.o. 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 Pt denies hx of known, chronic limb swelling in her family. She does not utilize compression garments, or a vasopneumatic compression "pump" as conservative measures. Chronic limb swelling and associated pain limits functional performance in all occupational domains, including functional ambulation and mobility, self-care and basic and instrumental ADLs. It limits participation in leisure pursuits, productive activities and social participle[ation.   Pt will benefit from  skilled Occupational Therapy for both  Intensive and Self  -management phases of Complete Decongestive Therapy (CDT) to reduce limb swelling and associated pain, to limit progression, to reduce infection risk , and to increase functional performance, social participation and quality of life. CDT include manual lymphatic drainage (MLD), skin care, therapeutic exercise and compression therapies- multilayer bandaging initially, one limb at a time,  and then appropriate compression garments, or alternatives in the final phase. Pt and caregivers who assist are trained to perform a;; aspects of Lymphedema self-care top ensure optimal self management over time. Without skilled  OT for CDT, further progression of lymphedema is certain and further functional decline is expected.)   OBJECTIVE IMPAIRMENTS: decreased activity tolerance, decreased knowledge of condition, decreased knowledge of use of DME, increased edema, pain, and chronic leg swelling .   ACTIVITY LIMITATIONS: limited functional ambulation and functional mobility 2/2 lymphedema -related foot and leg pain; standing, squatting, stairs, bathing, toileting, dressing, and hygiene/grooming  PARTICIPATION LIMITATIONS: meal prep, cooking, cleaning, driving, shopping, community activity, and volunteering  PERSONAL FACTORS: Time since onset of injury/illness/exacerbation are also affecting patient's functional outcome.   REHAB POTENTIAL: Good  CLINICAL DECISION MAKING: Evolving/moderate complexity  EVALUATION COMPLEXITY: Moderate   GOALS: Goals reviewed with patient? Yes  SHORT TERM GOALS: Target date: 4th OT Rx visit   Pt will demonstrate understanding of lymphedema precautions and prevention strategies with modified independence using a printed reference to identify at least 5 precautions and discussing how s/he may implement them into daily life to reduce risk of progression with extra time. Baseline:Max A Goal status: 11/20/23 GOAL MET  2.  Pt will be able to apply multilayer, knee length, gradient,  compression wraps to one leg at a time with max caregiver assistance to decrease limb volume, to limit infection risk, and to limit lymphedema progression.  Baseline: Dependent Goal status: 11/13/23 GOAL MET  LONG TERM GOALS: Target date: 12/12/22  Given this patient's Intake score of TBD % on the functional outcomes FOTO tool, patient will experience an increase in function of 5 points to improve basic and instrumental ADLs performance, including lymphedema self-care.  Baseline: Max A Goal status: GOAL DEFERRED. FOTO tool discontinued.  2.  Given this patient's Intake score of 64.71 on the Lymphedema Life Impact Scale (LLIS), patient will experience a reduction of at least 5 points in her perceived level of functional impairment resulting from lymphedema to improve functional performance and quality of life (QOL). Baseline: 64.71%  Goal status: PROGRESSING  3.  Pt will achieve at least a 10% volume reduction in B legs to return limb to typical size and shape, to limit infection risk and LE progression, to decrease pain, to improve function. Baseline: Dependent Goal status: PARTIALLY MET FOR L LEG WITH 5. 14% REDUCTION MEASURED ON 12/13/23 (15TH VISIT)  4.  Pt will obtain proper compression garments/devices and achieve modified independence (extra time + assistive devices) with donning/doffing to optimize limb volume reductions and limit LE progression over time. Baseline:  Goal status: ONGOING PLAN:  OT FREQUENCY: 2x/week  OT DURATION: 12 weeks  PLANNED INTERVENTIONS: 97110-Therapeutic exercises, 97530- Therapeutic activity, 97535- Self Care, 40981- Manual therapy, Patient/Family education, Manual lymph drainage, Compression bandaging, DME instructions, and fitting with compression garments  Custom-made gradient compression garments and HOS devices are medically necessary because they are uniquely sized and shaped to fit the exact dimensions of the affected extremities, and to provide  appropriate medical grade, graduated compression essential for optimally managing chronic, progressive lymphedema. Multiple custom compression garments are needed to ensure proper hygiene to limit infection risk. Custom compression garments should be replaced q 3-6 months When worn consistently for optimal lipo-lymphedema self-management over time. HOS devices, medically necessary to limit fibrosis buildup in tissue, should be replaced q 2 years and PRN when worn out.     Pt should be fit:  3 Left and 3 Right, custom, Jobst, flat knit, Elvarex Classic, ccl 2 or 3, knee length compression stockings with open toes to limit and control chronic leg swelling, to improve lymphatic circulation, to reduce infection risk, to limit formation of tissue fibrosis and to limit progression of lymphedema over time. Custom compression garments should be replaced q 3-6 months when worn consistently for optimal lymphedema self-management.   Pt should be fit with 1 R and 1 L custom, Jobst Relax convoluted Hours-of-Sleep (HOS)  compression device to counteract fluid accumulation and fibrosis formation leading to lymphedema progression. at night. HOS devices, medically necessary to limit fibrosis buildup in tissue, should be replaced q 2 years and PRN when worn out.    PLAN FOR NEXT SESSION:  LLE MLD as established using functional inguinal LN and deep abdominal lymphatics. Multilayer compression bandaging to L leg using 1 each 8, 10 and 12 cm wide x 5 meters long short stretch wrap over single layer of rosidal foam and cotton stockinett using gradient techniques. Pt instructed to remove wraps and report symptoms to her doctor if she experiences acute pain in the limb, signs/symptoms of infection, or atypical sob.  Pt / family edu for LE self care: simple self MLD leg sequence  Loel Dubonnet, MS, OTR/L, CLT-LANA 01/30/24 2:23 PM

## 2024-01-31 ENCOUNTER — Ambulatory Visit: Payer: Self-pay | Admitting: Occupational Therapy

## 2024-01-31 ENCOUNTER — Encounter: Payer: Self-pay | Admitting: Family Medicine

## 2024-02-01 ENCOUNTER — Ambulatory Visit: Admitting: Occupational Therapy

## 2024-02-01 DIAGNOSIS — I890 Lymphedema, not elsewhere classified: Secondary | ICD-10-CM

## 2024-02-01 NOTE — Therapy (Signed)
 OUTPATIENT OCCUPATIONAL THERAPY TREATMENT NOTE  LOWER EXTREMITY LYMPHEDEMA Patient Name: [name] MRN: [id number] DOB:1956-07-01, 68 y.o., female Today's Date: 02/01/2024  REPORTING PERIOD:   END OF SESSION:     OT End of Session - 02/01/24 1417     Visit Number 19    Number of Visits 36    Date for OT Re-Evaluation 03/20/24    OT Start Time 0210    OT Stop Time 0300    OT Time Calculation (min) 50 min    Activity Tolerance Patient tolerated treatment well;No increased pain    Behavior During Therapy WFL for tasks assessed/performed               Past Medical History:  Diagnosis Date   Allergy    Anemia    Anxiety    Arthritis    Chronic venous insufficiency 12/04/2015   Depression    Fibromyalgia    History of chicken pox    HTN (hypertension)    MRSA (methicillin resistant Staphylococcus aureus)    Obese    Plantar fasciitis    Pneumonia    Seasonal allergies    Sleep apnea 2012   took test in [place]-told to use cpap-could not ude   Past Surgical History:  Procedure Laterality Date   ANTERIOR CERVICAL DECOMP/DISCECTOMY FUSION  05/09/2008   C5-6, C6-7; with arthrodesis also   GASTRIC BYPASS  10/24/2000   KNEE ARTHROSCOPY  10/24/2002   rt and lt knee   ROTATOR CUFF REPAIR W/ DISTAL CLAVICLE EXCISION  01/06/2006   right   SHOULDER ARTHROSCOPY Left 10/24/2001   TONSILLECTOMY     TOTAL KNEE ARTHROPLASTY  03/29/2004   left   TOTAL KNEE ARTHROPLASTY  11/07/2003   right   TOTAL KNEE REVISION  07/30/2010   right; with lateral release   UPPER GASTROINTESTINAL ENDOSCOPY     Patient Active Problem List   Diagnosis Date Noted   Establishing care with new doctor, encounter for 12/28/2023   Venous stasis ulcer of other part of left lower leg limited to breakdown of skin with varicose veins (HCC) 12/28/2023   Stage 3a chronic kidney disease (HCC) 12/28/2023   Essential hypertension 02/14/2022   CAP (community acquired pneumonia) 06/09/2017   ADHD,  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   Insomnia    Psoriatic arthritis (HCC)    Cough    Chronic anemia 12/05/2015   Hoarding disorder 03/05/2015   Alcohol abuse 11/21/2014   Anxiety state 11/21/2014   Morbid obesity with BMI of 40.0-44.9, adult (HCC) 11/21/2014   Hematemesis 03/07/2014   Depressive disorder 12/09/2013   GAD (generalized anxiety disorder) 09/17/2013   Fibromyalgia 07/22/2013    PCP: Adela Glimpse, NP  REFERRING PROVIDER: Lucia Estelle, DPM  REFERRING DIAG: I89.0  THERAPY DIAG:  Lymphedema, not elsewhere classified  Rationale for Evaluation and Treatment: Rehabilitation  ONSET DATE: "I've had leg swelling for years. I don't know when it started. " + family hx of leg swelling-mother  SUBJECTIVE:  SUBJECTIVE STATEMENT: YULI LANIGAN returns to OT to commence CDT to RLE. She is accompanied by her spouse, [name]. Pt has no new complaints.  She denies LE-related leg pain, but states she doesn't feel well today.   PERTINENT HISTORY: Pls see active problem list above  PAIN:  Are you having LE related pain? No: NPRS scale: 0/10 Pain location: feet Pain description: burning Aggravating factors: walking Relieving factors: elevation  PRECAUTIONS: Other: LYMPHEDEMA PRECAUTIONS  WEIGHT BEARING RESTRICTIONS: No  FALLS:  Has patient fallen in last 6 months? 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   OBJECTIVE: Note: Objective measures were completed at Evaluation unless otherwise noted.  COGNITION:  Overall cognitive status: frequent redirection needed during eval to stay on topic    POSTURE: WFL  LE ROM: grossly WFL  STRENGTH: grossly WFL   BLE COMPARATIVE LIMB VOLUMETRICS INITIAL 11/01/23  LANDMARK RIGHT  (dominant)  R LEG (A-D) 4296.6 ml  R THIGH (E-G) ml  R FULL LIMB (A-G) ml  Limb Volume differential (LVD)  %  Volume change since initial %  Volume change overall V  (Blank rows = not tested)  LANDMARK LEFT    L LEG (A-D) 4829.1 ml  L  THIGH (E-G) ml  L FULL LIMB (A-G) ml  Limb Volume differential (LVD)   12.4%, L>R  Volume change since initial %  Volume change overall %  (Blank rows = not tested)    9th Visit 12/06/23        LANDMARK LEFT    L LEG (A-D) 4888.8 ml  L  THIGH (E-G) ml  L FULL LIMB (A-G) ml  Limb Volume differential (LVD)     Volume change since initial  + 1.2 %  Volume change overall %  (Blank rows = not tested)    15 th Visit 01/10/24       LANDMARK LEFT    L LEG (A-D) 4637.8 ml  L  THIGH (E-G) ml  L FULL LIMB (A-G) ml  Limb Volume differential (LVD)     Volume change since last measured 12/06/23  DECREASED by 5.14%  Volume change overall %  (Blank rows = not tested)   20th Visit- BLE COMPARATIVE LIMB VOLUMETRICS  TBA  LANDMARK RIGHT  (dominant)  R LEG (A-D)  ml  R THIGH (E-G) ml  R FULL LIMB (A-G) ml  Limb Volume differential (LVD)  %  Volume change since initial %  Volume change overall V  (Blank rows = not tested)  LANDMARK LEFT    L LEG (A-D)  ml  L  THIGH (E-G) ml  L FULL LIMB (A-G) ml  Limb Volume differential (LVD)     Volume change since initial %  Volume change overall %  (Blank rows = not tested)    Mild, Stage  II, 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  Obesity  Skin  Description Hyper-Keratosis Peau' de Orange Shiny Tight Fibrotic/ Indurated Fatty Doughy Spongy/ boggy   x    Bilateral Chronic Lipo  dermatosclerosis Hardened, thickened, leathery      Skin dry Flaky WNL Macerated   mildly x     Color Redness Varicosities Blanching Hemosiderin Stain Mottled   x x x  Severe bilaterally x   Odor Malodorous Yeast Fungal infection  WNL      x   Temperature Warm Cool wnl    x     Pitting Edema   1+ 2+ 3+ 4+ Non-pitting         x   Girth Symmetrical Asymmetrical                   Distribution    L>R toes to popliteal     Stemmer Sign Positive Negative   +    Lymphorrhea History Of:  Present Absent     x    Wounds History Of Present Absent Venous Arterial Pressure Sheer     x        Signs of Infection Redness Warmth Erythema Acute Swelling Drainage Borders                    Sensation Light Touch Deep pressure Hypersensitivity Neuropathic pain   In Tact Impaired In tact Impaired Absent Impaired Plantar surfaces bilaterally   x  x  x      Nails WNL   Fungus nail dystrophy     x   Hair Growth Symmetrical Asymmetrical   x    Skin Creases Base of toes  Ankles   Base of Fingers knees       Abdominal pannus Thigh Lobules  Face/neck   x           TODAY'S TREATMENT:                                                                                                                                         LLE/LLQ MLD w Simultaneous skin L LEG multilayer gradient compression wraps  PATIENT EDUCATION:  Continued Pt/ CG edu for lymphedema self care home program throughout session. Topics include outcome of comparative limb volumetrics- starting limb volume differentials (LVDs), technology and gradient techniques used for short stretch, multilayer compression wrapping, simple self-MLD, therapeutic lymphatic pumping exercises, skin/nail care, LE precautions,. compression garment recommendations and specifications, wear  and care schedule and compression garment donning / doffing w assistive devices. Discussed progress towards all OT goals since commencing CDT. All questions answered to the Pt's satisfaction. Good return. Person educated: Patient  Education method: Explanation, Demonstration, and Handouts Education comprehension: verbalized understanding, returned demonstration, verbal cues required, and needs further education  HOME EXERCISE PROGRAM: BLE lymphatic pumping there ex using- 1 sets of 10 reps, each exercise in order-  1-2 x daily, bilaterally Simple self MLD 1 x daily Daily skin care to increase hydration, skin mobility and decrease infection risk- can be done during MLD Compression wraps 23/7 until garment fitting  complete   ASSESSMENT  CLINICAL IMPRESSION:  Very dark hemosiderin stain is unchanged since commencing OT for CDT. We continued MLD to LLE with simultaneous skin care. Pt tolerated reapplication of compression wraps without increased pain. Reviewed simple self MLD while performing manual therapy. Pt performed neck sequence independently using correct technique. Cont as per POC.   (Initial Eval 09/13/23: Teaghan Melrose is a 68 y.o. 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 Pt denies hx of known, chronic limb swelling in her family. She does not utilize compression garments, or a vasopneumatic compression "pump" as conservative measures. Chronic limb swelling and associated pain limits functional performance in all occupational domains, including functional ambulation and mobility, self-care and basic and instrumental ADLs. It limits participation in leisure pursuits, productive activities and social participle[ation.   Pt will benefit from   skilled Occupational Therapy for both  Intensive and Self -management phases of Complete Decongestive Therapy (CDT) to reduce limb swelling and associated pain, to limit progression, to reduce infection risk , and to increase functional performance, social participation and quality of life. CDT include manual lymphatic drainage (MLD), skin care, therapeutic exercise and compression therapies- multilayer bandaging initially, one limb at a time,  and then appropriate compression garments, or alternatives in the final phase. Pt and caregivers who assist are trained to perform a;; aspects of Lymphedema self-care top ensure optimal self management over time. Without skilled  OT for CDT, further progression of lymphedema is certain and further functional decline is expected.)   OBJECTIVE IMPAIRMENTS: decreased activity tolerance, decreased knowledge of condition, decreased knowledge of use of DME, increased edema, pain, and chronic leg swelling .   ACTIVITY LIMITATIONS: limited functional ambulation and functional mobility 2/2 lymphedema -related foot and leg pain; standing, squatting, stairs, bathing, toileting, dressing, and hygiene/grooming  PARTICIPATION LIMITATIONS: meal prep, cooking, cleaning, driving, shopping, community activity, and volunteering  PERSONAL FACTORS: Time since onset of injury/illness/exacerbation are also affecting patient's functional outcome.   REHAB POTENTIAL: Good  CLINICAL DECISION MAKING: Evolving/moderate complexity  EVALUATION COMPLEXITY: Moderate   GOALS: Goals reviewed with patient? Yes  SHORT TERM GOALS: Target date: 4th OT Rx visit   Pt will demonstrate understanding of lymphedema precautions and prevention strategies with modified independence using a printed reference to identify at least 5 precautions and discussing how s/he may implement them into daily life to reduce risk of progression with extra time. Baseline:Max A Goal status: 11/20/23 GOAL MET  2.   Pt will be able to apply multilayer, knee length, gradient, compression wraps to one leg at a time with max caregiver assistance to decrease limb volume, to limit infection risk, and to limit lymphedema progression.  Baseline: Dependent Goal status: 11/13/23 GOAL MET  LONG TERM GOALS: Target date: 12/12/22  Given this patient's Intake score of TBD % on the functional outcomes FOTO tool, patient will experience an increase in function of 5 points to improve basic and instrumental ADLs performance, including lymphedema self-care.  Baseline: Max A Goal status: GOAL DEFERRED. FOTO tool discontinued.  2.  Given this patient's Intake score of 64.71 on the Lymphedema Life Impact Scale (LLIS), patient will experience a reduction of at least 5 points in her perceived level of functional impairment resulting from lymphedema to improve functional performance and quality of life (QOL). Baseline: 64.71%  Goal status: PROGRESSING  3.  Pt will achieve at least a 10% volume reduction in B legs to return limb to typical size and shape, to limit infection risk and LE progression, to decrease pain, to improve function. Baseline: Dependent Goal status: PARTIALLY MET FOR L LEG WITH 5. 14% REDUCTION MEASURED ON 12/13/23 (15TH VISIT)  4.  Pt will obtain proper compression garments/devices and achieve modified independence (extra time + assistive devices) with donning/doffing to optimize limb volume reductions and limit LE progression over time. Baseline:  Goal status: ONGOING PLAN:  OT FREQUENCY: 2x/week  OT DURATION: 12 weeks  PLANNED INTERVENTIONS: 97110-Therapeutic exercises, 97530- Therapeutic activity, 97535- Self Care, 16109- Manual therapy, Patient/Family education, Manual lymph drainage, Compression bandaging, DME instructions, and fitting with compression garments  Custom-made gradient compression garments and HOS devices are medically necessary because they are uniquely sized and shaped to fit the exact  dimensions of the affected extremities, and to provide appropriate medical grade, graduated compression essential for optimally managing chronic, progressive lymphedema. Multiple custom compression garments are needed to ensure proper hygiene to limit infection risk. Custom compression garments should be replaced q 3-6 months When worn consistently for optimal lipo-lymphedema self-management over time. HOS devices, medically necessary to limit fibrosis buildup in tissue, should be replaced q 2 years and PRN when worn out.     Pt should be fit:  3 Left and 3 Right, custom, Jobst, flat knit, Elvarex Classic, ccl 2 or 3, knee length compression stockings with open toes to limit and control chronic leg swelling, to improve lymphatic circulation, to reduce infection risk, to limit formation of tissue fibrosis and to limit progression of lymphedema over time. Custom compression garments should be replaced q 3-6 months when worn consistently for optimal lymphedema self-management.   Pt should be fit with 1 R and 1 L custom, Jobst Relax convoluted Hours-of-Sleep (HOS)  compression device to counteract fluid accumulation and fibrosis formation leading to lymphedema progression. at night. HOS devices, medically necessary to limit fibrosis buildup in tissue, should be replaced q 2 years and PRN when worn out.    PLAN FOR NEXT SESSION:  LLE MLD as established using functional inguinal LN and deep abdominal lymphatics. Multilayer compression bandaging to L leg using 1 each 8, 10 and 12 cm wide x 5 meters long short stretch wrap over single layer of rosidal foam and cotton stockinett using gradient techniques. Pt instructed to remove wraps and report symptoms to her doctor if she experiences acute pain in the limb, signs/symptoms of infection, or atypical sob.  Pt / family edu for LE self care: simple self MLD leg sequence  Loel Dubonnet, MS, OTR/L, CLT-LANA 02/01/24 2:19 PM

## 2024-02-05 ENCOUNTER — Ambulatory Visit: Payer: Self-pay | Admitting: Occupational Therapy

## 2024-02-06 ENCOUNTER — Ambulatory Visit: Admitting: Occupational Therapy

## 2024-02-06 DIAGNOSIS — I890 Lymphedema, not elsewhere classified: Secondary | ICD-10-CM | POA: Diagnosis not present

## 2024-02-06 NOTE — Therapy (Unsigned)
 OUTPATIENT OCCUPATIONAL THERAPY TREATMENT NOTE  LOWER EXTREMITY LYMPHEDEMA Patient Name: [name] MRN: [id number] DOB:Nov 28, 1955, 68 y.o., female Today's Date: 02/06/2024  REPORTING PERIOD:   END OF SESSION:     OT End of Session - 02/06/24 1514     Visit Number 20    Number of Visits 36    Date for OT Re-Evaluation 03/20/24    OT Start Time 0310    Activity Tolerance Patient tolerated treatment well;No increased pain    Behavior During Therapy WFL for tasks assessed/performed               Past Medical History:  Diagnosis Date   Allergy    Anemia    Anxiety    Arthritis    Chronic venous insufficiency 12/04/2015   Depression    Fibromyalgia    History of chicken pox    HTN (hypertension)    MRSA (methicillin resistant Staphylococcus aureus)    Obese    Plantar fasciitis    Pneumonia    Seasonal allergies    Sleep apnea 2012   took test in [place]-told to use cpap-could not ude   Past Surgical History:  Procedure Laterality Date   ANTERIOR CERVICAL DECOMP/DISCECTOMY FUSION  05/09/2008   C5-6, C6-7; with arthrodesis also   GASTRIC BYPASS  10/24/2000   KNEE ARTHROSCOPY  10/24/2002   rt and lt knee   ROTATOR CUFF REPAIR W/ DISTAL CLAVICLE EXCISION  01/06/2006   right   SHOULDER ARTHROSCOPY Left 10/24/2001   TONSILLECTOMY     TOTAL KNEE ARTHROPLASTY  03/29/2004   left   TOTAL KNEE ARTHROPLASTY  11/07/2003   right   TOTAL KNEE REVISION  07/30/2010   right; with lateral release   UPPER GASTROINTESTINAL ENDOSCOPY     Patient Active Problem List   Diagnosis Date Noted   Establishing care with new doctor, encounter for 12/28/2023   Venous stasis ulcer of other part of left lower leg limited to breakdown of skin with varicose veins (HCC) 12/28/2023   Stage 3a chronic kidney disease (HCC) 12/28/2023   Essential hypertension 02/14/2022   CAP (community acquired pneumonia) 06/09/2017   ADHD, 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   Insomnia     Psoriatic arthritis (HCC)    Cough    Chronic anemia 12/05/2015   Hoarding disorder 03/05/2015   Alcohol abuse 11/21/2014   Anxiety state 11/21/2014   Morbid obesity with BMI of 40.0-44.9, adult (HCC) 11/21/2014   Hematemesis 03/07/2014   Depressive disorder 12/09/2013   GAD (generalized anxiety disorder) 09/17/2013   Fibromyalgia 07/22/2013    PCP: Langley Pippin, NP  REFERRING PROVIDER: Haze List, DPM  REFERRING DIAG: I89.0  THERAPY DIAG:  Lymphedema, not elsewhere classified  Rationale for Evaluation and Treatment: Rehabilitation  ONSET DATE: "I've had leg swelling for years. I don't know when it started. " + family hx of leg swelling-mother  SUBJECTIVE:  SUBJECTIVE STATEMENT: [name] returns to OT to commence CDT to RLE. She is accompanied by her spouse, [name]. Pt has no new complaints.  She denies LE-related leg pain, but states she doesn't feel well today.   PERTINENT HISTORY: Pls see active problem list above  PAIN:  Are you having LE related pain? No: NPRS scale: 0/10 Pain location: feet Pain description: burning Aggravating factors: walking Relieving factors: elevation  PRECAUTIONS: Other: LYMPHEDEMA PRECAUTIONS  WEIGHT BEARING RESTRICTIONS: No  FALLS:  Has patient fallen in last 6 months? 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   OBJECTIVE: Note: Objective measures were completed at Evaluation unless otherwise noted.  COGNITION:  Overall cognitive status: frequent redirection needed during eval to stay on topic    POSTURE: WFL  LE ROM: grossly WFL  STRENGTH: grossly WFL   BLE COMPARATIVE LIMB VOLUMETRICS INITIAL 11/01/23  LANDMARK RIGHT  (dominant)  R LEG (A-D) 4296.6 ml  R THIGH (E-G) ml  R FULL LIMB (A-G) ml  Limb Volume differential (LVD)  %  Volume change since initial %  Volume change overall V  (Blank rows = not tested)  LANDMARK LEFT    L LEG (A-D) 4829.1 ml  L  THIGH (E-G) ml  L FULL LIMB (A-G) ml  Limb Volume differential (LVD)   12.4%, L>R  Volume change since initial %  Volume change overall %  (Blank rows = not tested)    9th Visit 12/06/23        LANDMARK LEFT    L LEG (A-D) 4888.8 ml  L  THIGH (E-G) ml  L FULL LIMB (A-G) ml  Limb Volume differential (LVD)     Volume change since initial  + 1.2 %  Volume change overall %  (Blank rows = not tested)    15 th Visit 01/10/24       LANDMARK LEFT    L LEG (A-D) 4637.8 ml  L  THIGH (E-G) ml  L FULL LIMB (A-G) ml  Limb Volume differential (LVD)     Volume change since last measured 12/06/23  DECREASED by 5.14%  Volume change overall %  (Blank rows = not tested)   20th Visit- BLE COMPARATIVE LIMB VOLUMETRICS  02/01/24   LANDMARK LEFT    L LEG (A-D) 4139.2 ml  L  THIGH (E-G) ml  L FULL LIMB (A-G) ml  Limb Volume differential (LVD)     Volume change since initial DECREASED 10.8 %  Volume change overall %  (Blank rows = not tested)    Mild, Stage  II, 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 Obesity  Skin  Description Hyper-Keratosis Peau' de Orange Shiny Tight Fibrotic/ Indurated Fatty Doughy Spongy/ boggy   x    Bilateral Chronic Lipo dermatosclerosis Hardened, thickened, leathery      Skin dry Flaky WNL Macerated   mildly x      Color Redness Varicosities Blanching Hemosiderin Stain Mottled   x x x  Severe bilaterally x   Odor Malodorous Yeast  Fungal infection  WNL      x   Temperature Warm Cool wnl    x     Pitting Edema   1+ 2+ 3+ 4+ Non-pitting         x   Girth Symmetrical Asymmetrical                   Distribution    L>R toes to popliteal     Stemmer Sign Positive Negative   +    Lymphorrhea History Of:  Present Absent     x    Wounds History Of Present Absent Venous Arterial Pressure Sheer     x        Signs of Infection Redness Warmth Erythema Acute Swelling Drainage Borders                    Sensation Light Touch Deep pressure Hypersensitivity Neuropathic pain   In Tact Impaired In tact Impaired Absent Impaired Plantar surfaces bilaterally   x  x  x      Nails WNL   Fungus nail dystrophy     x   Hair Growth Symmetrical Asymmetrical   x    Skin Creases Base of toes  Ankles   Base of Fingers knees       Abdominal pannus Thigh Lobules  Face/neck   x           TODAY'S TREATMENT:                                                                                                                                         LLE/LLQ MLD w Simultaneous skin L LEG multilayer gradient compression wraps  PATIENT EDUCATION:  Continued Pt/ CG edu for lymphedema self care home program throughout session. Topics include outcome of comparative limb volumetrics- starting limb volume differentials (LVDs), technology and gradient techniques used for short stretch, multilayer compression wrapping, simple self-MLD, therapeutic lymphatic pumping exercises, skin/nail care, LE precautions,. compression garment recommendations and specifications, wear and care schedule and compression garment donning / doffing w assistive devices. Discussed progress towards all OT goals since commencing CDT. All questions answered to the Pt's satisfaction. Good return. Person educated: Patient  Education method:  Explanation, Demonstration, and Handouts Education comprehension: verbalized understanding, returned demonstration, verbal cues required, and needs further education  HOME EXERCISE PROGRAM: BLE lymphatic pumping there ex using- 1 sets of 10 reps, each exercise in order-  1-2 x daily, bilaterally Simple self MLD 1 x daily Daily skin care to increase hydration, skin mobility and decrease infection risk- can be done during MLD Compression wraps 23/7 until garment fitting complete   ASSESSMENT  CLINICAL IMPRESSION:     Very dark hemosiderin stain is unchanged since commencing OT for CDT. We continued MLD to LLE with simultaneous skin care. Pt tolerated reapplication of compression wraps without increased pain. Reviewed simple self  MLD while performing manual therapy. Pt performed neck sequence independently using correct technique. Cont as per POC.   (Initial Eval 09/13/23: Hadleigh Felber is a 68 y.o. 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 Pt denies hx of known, chronic limb swelling in her family. She does not utilize compression garments, or a vasopneumatic compression "pump" as conservative measures. Chronic limb swelling and associated pain limits functional performance in all occupational domains, including functional ambulation and mobility, self-care and basic and instrumental ADLs. It limits participation in leisure pursuits, productive activities and social participle[ation.   Pt will benefit from  skilled Occupational Therapy for both  Intensive and Self -management phases of Complete Decongestive Therapy (CDT) to reduce limb swelling and associated pain, to limit progression, to reduce infection risk , and to increase functional  performance, social participation and quality of life. CDT include manual lymphatic drainage (MLD), skin care, therapeutic exercise and compression therapies- multilayer bandaging initially, one limb at a time,  and then appropriate compression garments, or alternatives in the final phase. Pt and caregivers who assist are trained to perform a;; aspects of Lymphedema self-care top ensure optimal self management over time. Without skilled  OT for CDT, further progression of lymphedema is certain and further functional decline is expected.)   OBJECTIVE IMPAIRMENTS: decreased activity tolerance, decreased knowledge of condition, decreased knowledge of use of DME, increased edema, pain, and chronic leg swelling .   ACTIVITY LIMITATIONS: limited functional ambulation and functional mobility 2/2 lymphedema -related foot and leg pain; standing, squatting, stairs, bathing, toileting, dressing, and hygiene/grooming  PARTICIPATION LIMITATIONS: meal prep, cooking, cleaning, driving, shopping, community activity, and volunteering  PERSONAL FACTORS: Time since onset of injury/illness/exacerbation are also affecting patient's functional outcome.   REHAB POTENTIAL: Good  CLINICAL DECISION MAKING: Evolving/moderate complexity  EVALUATION COMPLEXITY: Moderate   GOALS: Goals reviewed with patient? Yes  SHORT TERM GOALS: Target date: 4th OT Rx visit   Pt will demonstrate understanding of lymphedema precautions and prevention strategies with modified independence using a printed reference to identify at least 5 precautions and discussing how s/he may implement them into daily life to reduce risk of progression with extra time. Baseline:Max A Goal status: 11/20/23 GOAL MET  2.  Pt will be able to apply multilayer, knee length, gradient, compression wraps to one leg at a time with max caregiver assistance to decrease limb volume, to limit infection risk, and to limit lymphedema progression.  Baseline:  Dependent Goal status: 11/13/23 GOAL MET  LONG TERM GOALS: Target date: 12/12/22  Given this patient's Intake score of TBD % on the functional outcomes FOTO tool, patient will experience an increase in function of 5 points to improve basic and instrumental ADLs performance, including lymphedema self-care.  Baseline: Max A Goal status: GOAL DEFERRED. FOTO tool discontinued.  2.  Given this patient's Intake score of 64.71 on the Lymphedema Life Impact Scale (LLIS), patient will experience a reduction of at least 5 points in her perceived level of functional impairment resulting from lymphedema to improve functional performance and quality of life (QOL). Baseline: 64.71% Goal status: PROGRESSING  3.  Pt will achieve at least a 10% volume reduction in B legs to return limb to typical size and shape, to limit infection risk and LE progression, to decrease pain, to improve function. Baseline:  Dependent Goal status: MET FOR L LEG with 10.8% REDUCTION.   4.  Pt will obtain proper compression garments/devices and achieve modified independence (extra time + assistive devices) with donning/doffing to optimize limb volume reductions and limit LE progression over time. Baseline:  Goal status: PROGRESSING PLAN:  OT FREQUENCY: 2x/week  OT DURATION: 12 weeks  PLANNED INTERVENTIONS: 97110-Therapeutic exercises, 97530- Therapeutic activity, 97535- Self Care, 36644- Manual therapy, Patient/Family education, Manual lymph drainage, Compression bandaging, DME instructions, and fitting with compression garments  Custom-made gradient compression garments and HOS devices are medically necessary because they are uniquely sized and shaped to fit the exact dimensions of the affected extremities, and to provide appropriate medical grade, graduated compression essential for optimally managing chronic, progressive lymphedema. Multiple custom compression garments are needed to ensure proper hygiene to limit infection  risk. Custom compression garments should be replaced q 3-6 months When worn consistently for optimal lipo-lymphedema self-management over time. HOS devices, medically necessary to limit fibrosis buildup in tissue, should be replaced q 2 years and PRN when worn out.     Pt should be fit:  3 Left and 3 Right, custom, Jobst, flat knit, Elvarex Classic, ccl 2 or 3, knee length compression stockings with open toes to limit and control chronic leg swelling, to improve lymphatic circulation, to reduce infection risk, to limit formation of tissue fibrosis and to limit progression of lymphedema over time. Custom compression garments should be replaced q 3-6 months when worn consistently for optimal lymphedema self-management.   Pt should be fit with 1 R and 1 L custom, Jobst Relax convoluted Hours-of-Sleep (HOS)  compression device to counteract fluid accumulation and fibrosis formation leading to lymphedema progression. at night. HOS devices, medically necessary to limit fibrosis buildup in tissue, should be replaced q 2 years and PRN when worn out.    PLAN FOR NEXT SESSION:  LLE MLD as established using functional inguinal LN and deep abdominal lymphatics. Multilayer compression bandaging to L leg using 1 each 8, 10 and 12 cm wide x 5 meters long short stretch wrap over single layer of rosidal foam and cotton stockinett using gradient techniques. Pt instructed to remove wraps and report symptoms to her doctor if she experiences acute pain in the limb, signs/symptoms of infection, or atypical sob.  Pt / family edu for LE self care: simple self MLD leg sequence  Arnold Bicker, MS, OTR/L, CLT-LANA 02/06/24 3:16 PM

## 2024-02-07 ENCOUNTER — Other Ambulatory Visit (HOSPITAL_COMMUNITY): Payer: Self-pay

## 2024-02-07 ENCOUNTER — Ambulatory Visit: Payer: Self-pay | Admitting: Occupational Therapy

## 2024-02-07 DIAGNOSIS — M25551 Pain in right hip: Secondary | ICD-10-CM | POA: Diagnosis not present

## 2024-02-07 DIAGNOSIS — G894 Chronic pain syndrome: Secondary | ICD-10-CM | POA: Diagnosis not present

## 2024-02-07 DIAGNOSIS — M150 Primary generalized (osteo)arthritis: Secondary | ICD-10-CM | POA: Diagnosis not present

## 2024-02-07 DIAGNOSIS — M62831 Muscle spasm of calf: Secondary | ICD-10-CM | POA: Diagnosis not present

## 2024-02-07 MED ORDER — MORPHINE SULFATE ER 30 MG PO TBCR
EXTENDED_RELEASE_TABLET | ORAL | 0 refills | Status: AC
Start: 2024-03-06 — End: ?
  Filled 2024-03-12: qty 60, 30d supply, fill #0

## 2024-02-07 MED ORDER — TRAZODONE HCL 50 MG PO TABS
50.0000 mg | ORAL_TABLET | Freq: Every evening | ORAL | 2 refills | Status: DC
  Filled 2024-02-07: qty 30, 30d supply, fill #0
  Filled 2024-03-13: qty 30, 30d supply, fill #1

## 2024-02-07 MED ORDER — TIZANIDINE HCL 2 MG PO TABS
2.0000 mg | ORAL_TABLET | Freq: Three times a day (TID) | ORAL | 2 refills | Status: AC
  Filled 2024-02-07: qty 90, 30d supply, fill #0
  Filled 2024-03-12 (×2): qty 90, 30d supply, fill #1
  Filled 2024-06-08: qty 90, 30d supply, fill #2

## 2024-02-07 MED ORDER — MORPHINE SULFATE ER 30 MG PO TBCR
EXTENDED_RELEASE_TABLET | ORAL | 0 refills | Status: DC
  Filled 2024-02-07: qty 60, 30d supply, fill #0

## 2024-02-08 ENCOUNTER — Other Ambulatory Visit: Payer: Self-pay

## 2024-02-08 ENCOUNTER — Other Ambulatory Visit (HOSPITAL_COMMUNITY): Payer: Self-pay

## 2024-02-08 ENCOUNTER — Ambulatory Visit: Admitting: Occupational Therapy

## 2024-02-08 ENCOUNTER — Encounter: Payer: Self-pay | Admitting: Occupational Therapy

## 2024-02-08 DIAGNOSIS — I890 Lymphedema, not elsewhere classified: Secondary | ICD-10-CM

## 2024-02-08 NOTE — Therapy (Signed)
 OUTPATIENT OCCUPATIONAL THERAPY TREATMENT NOTE AND PROGRESS REPORT  LOWER EXTREMITY LYMPHEDEMA Patient Name: [name] MRN: [id number] DOB:October 09, 1956, 68 y.o., female Today's Date: 02/08/2024  REPORTING PERIOD: 12/21/23 - 02/06/24  END OF SESSION:   OT End of Session - 02/08/24 1413     Visit Number 21    Number of Visits 36    Date for OT Re-Evaluation 03/20/24    OT Start Time 0205    Activity Tolerance Patient tolerated treatment well;No increased pain    Behavior During Therapy WFL for tasks assessed/performed             Past Medical History:  Diagnosis Date   Allergy    Anemia    Anxiety    Arthritis    Chronic venous insufficiency 12/04/2015   Depression    Fibromyalgia    History of chicken pox    HTN (hypertension)    MRSA (methicillin resistant Staphylococcus aureus)    Obese    Plantar fasciitis    Pneumonia    Seasonal allergies    Sleep apnea 2012   took test in [place]-told to use cpap-could not ude   Past Surgical History:  Procedure Laterality Date   ANTERIOR CERVICAL DECOMP/DISCECTOMY FUSION  05/09/2008   C5-6, C6-7; with arthrodesis also   GASTRIC BYPASS  10/24/2000   KNEE ARTHROSCOPY  10/24/2002   rt and lt knee   ROTATOR CUFF REPAIR W/ DISTAL CLAVICLE EXCISION  01/06/2006   right   SHOULDER ARTHROSCOPY Left 10/24/2001   TONSILLECTOMY     TOTAL KNEE ARTHROPLASTY  03/29/2004   left   TOTAL KNEE ARTHROPLASTY  11/07/2003   right   TOTAL KNEE REVISION  07/30/2010   right; with lateral release   UPPER GASTROINTESTINAL ENDOSCOPY     Patient Active Problem List   Diagnosis Date Noted   Establishing care with new doctor, encounter for 12/28/2023   Venous stasis ulcer of other part of left lower leg limited to breakdown of skin with varicose veins (HCC) 12/28/2023   Stage 3a chronic kidney disease (HCC) 12/28/2023   Essential hypertension 02/14/2022   CAP (community acquired pneumonia) 06/09/2017   ADHD, 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 I don't know when it started. " + family hx of leg swelling-mother  SUBJECTIVE:  SUBJECTIVE STATEMENT: [name] returns to OT to commence CDT to RLE. She is accompanied by her spouse, [name]. Pt has no new complaints.  She denies LE-related leg pain, but states she feels tired. Pt reports DME vendor called her yesterday to check in and let her know she is working on her garments.   PERTINENT HISTORY: Pls see active problem list above  PAIN:  Are you having LE related pain? No: NPRS scale: 0/10 Pain location: feet Pain description: burning Aggravating factors: walking Relieving factors: elevation  PRECAUTIONS: Other: LYMPHEDEMA PRECAUTIONS  WEIGHT BEARING RESTRICTIONS: No  FALLS:  Has patient fallen in last 6 months? 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   OBJECTIVE: Note: Objective measures were completed at Evaluation unless otherwise noted.  COGNITION:  Overall cognitive status: frequent redirection needed during eval to stay on topic   POSTURE: WFL  LE ROM: grossly WFL  STRENGTH: grossly WFL   BLE COMPARATIVE LIMB VOLUMETRICS INITIAL 11/01/23  LANDMARK RIGHT  (dominant)  R LEG (A-D) 4296.6 ml  R THIGH (E-G) ml  R FULL LIMB (A-G) ml  Limb Volume differential (LVD)  %  Volume change since initial %  Volume change overall V  (Blank rows = not tested)  LANDMARK LEFT    L LEG (A-D) 4829.1 ml  L  THIGH (E-G) ml  L FULL LIMB (A-G) ml  Limb Volume differential (LVD)   12.4%, L>R  Volume change since initial %  Volume change overall %  (Blank rows = not tested)    9th Visit 12/06/23        LANDMARK LEFT    L LEG (A-D) 4888.8 ml  L  THIGH (E-G) ml  L FULL LIMB (A-G) ml  Limb Volume differential (LVD)     Volume change since initial  + 1.2 %  Volume change overall %  (Blank rows = not tested)    15 th Visit 01/10/24       LANDMARK LEFT    L LEG (A-D) 4637.8 ml  L  THIGH (E-G) ml  L FULL LIMB (A-G) ml  Limb Volume differential (LVD)     Volume change since last measured 12/06/23  DECREASED by 5.14%  Volume change overall %  (Blank rows = not tested)   20th Visit- BLE COMPARATIVE LIMB VOLUMETRICS  02/01/24   LANDMARK LEFT    L LEG (A-D) 4139.2 ml  L  THIGH (E-G) ml  L FULL LIMB (A-G) ml  Limb Volume differential (LVD)     Volume change since initial DECREASED 10.8 %  Volume change overall %  (Blank rows = not tested)    Mild, Stage  II, 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 Obesity  Skin  Description Hyper-Keratosis Peau' de Orange Shiny Tight Fibrotic/ Indurated Fatty Doughy  Spongy/ boggy   x    Bilateral Chronic Lipo dermatosclerosis Hardened, thickened, leathery      Skin dry Flaky WNL Macerated   mildly x     Color Redness Varicosities Blanching  Hemosiderin Stain Mottled   x x x  Severe bilaterally x   Odor Malodorous Yeast Fungal infection  WNL      x   Temperature Warm Cool wnl    x     Pitting Edema   1+ 2+ 3+ 4+ Non-pitting         x   Girth Symmetrical Asymmetrical                   Distribution    L>R toes to popliteal     Stemmer Sign Positive Negative   +    Lymphorrhea History Of:  Present Absent     x    Wounds History Of Present Absent Venous Arterial Pressure Sheer     x        Signs of Infection Redness Warmth Erythema Acute Swelling Drainage Borders                    Sensation Light Touch Deep pressure Hypersensitivity Neuropathic pain   In Tact Impaired In tact Impaired Absent Impaired Plantar surfaces bilaterally   x  x  x      Nails WNL   Fungus nail dystrophy     x   Hair Growth Symmetrical Asymmetrical   x    Skin Creases Base of toes  Ankles   Base of Fingers knees       Abdominal pannus Thigh Lobules  Face/neck   x           TODAY'S TREATMENT:                                                                                                                                         LLE/LLQ MLD w Simultaneous skin L LEG multilayer gradient compression wraps  PATIENT EDUCATION:  Continued Pt/ CG edu for lymphedema self care home program throughout session. Topics include outcome of comparative limb volumetrics- starting limb volume differentials (LVDs), technology and gradient techniques used for short stretch, multilayer compression wrapping, simple self-MLD, therapeutic lymphatic pumping exercises, skin/nail care, LE precautions,. compression garment recommendations and specifications, wear and care schedule and compression garment donning / doffing w assistive devices. Discussed progress towards  all OT goals since commencing CDT. All questions answered to the Pt's satisfaction. Good return. Person educated: Patient  Education method: Explanation, Demonstration, and Handouts Education comprehension: verbalized understanding, returned demonstration, verbal cues required, and needs further education  HOME EXERCISE PROGRAM: BLE lymphatic pumping there ex using- 1 sets of 10 reps, each exercise in order-  1-2 x daily, bilaterally Simple self MLD 1 x daily Daily skin care to increase hydration, skin mobility and decrease infection risk- can be done during MLD Compression wraps 23/7 until garment fitting complete   ASSESSMENT  CLINICAL IMPRESSION: Pt continues to make steady progress towards all OT goals for lymphedema care. LLE was slow to respond  to MLD and compression, but in last few weeks limb volume reduction in proximal leg has accelerated and Pt met 10% reduction goal with 10.8% . Custom LLE compression garment has been measured and ordered. We're awaiting delivery for fitting and training. Please see GOALS section for more detailed review. Today we continues MLD w simultaneous skin care to LLE , then knee length compression wrapping as established. Cont as per POC.    (Initial Eval 09/13/23: Antavia Tandy is a 68 y.o. 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 Pt denies hx of known, chronic limb swelling in her family. She does not utilize compression garments, or a vasopneumatic compression "pump" as conservative measures. Chronic limb swelling and associated pain limits functional performance in all occupational domains, including functional ambulation and mobility, self-care and basic and instrumental ADLs. It limits participation in  leisure pursuits, productive activities and social participle[ation.   Pt will benefit from  skilled Occupational Therapy for both  Intensive and Self -management phases of Complete Decongestive Therapy (CDT) to reduce limb swelling and associated pain, to limit progression, to reduce infection risk , and to increase functional performance, social participation and quality of life. CDT include manual lymphatic drainage (MLD), skin care, therapeutic exercise and compression therapies- multilayer bandaging initially, one limb at a time,  and then appropriate compression garments, or alternatives in the final phase. Pt and caregivers who assist are trained to perform a;; aspects of Lymphedema self-care top ensure optimal self management over time. Without skilled  OT for CDT, further progression of lymphedema is certain and further functional decline is expected.)   OBJECTIVE IMPAIRMENTS: decreased activity tolerance, decreased knowledge of condition, decreased knowledge of use of DME, increased edema, pain, and chronic leg swelling .   ACTIVITY LIMITATIONS: limited functional ambulation and functional mobility 2/2 lymphedema -related foot and leg pain; standing, squatting, stairs, bathing, toileting, dressing, and hygiene/grooming  PARTICIPATION LIMITATIONS: meal prep, cooking, cleaning, driving, shopping, community activity, and volunteering  PERSONAL FACTORS: Time since onset of injury/illness/exacerbation are also affecting patient's functional outcome.   REHAB POTENTIAL: Good  CLINICAL DECISION MAKING: Evolving/moderate complexity  EVALUATION COMPLEXITY: Moderate   GOALS: Goals reviewed with patient? Yes  SHORT TERM GOALS: Target date: 4th OT Rx visit   Pt will demonstrate understanding of lymphedema precautions and prevention strategies with modified independence using a printed reference to identify at least 5 precautions and discussing how s/he may implement them into daily life to  reduce risk of progression with extra time. Baseline:Max A Goal status: 11/20/23 GOAL MET  2.  Pt will be able to apply multilayer, knee length, gradient, compression wraps to one leg at a time with max caregiver assistance to decrease limb volume, to limit infection risk, and to limit lymphedema progression.  Baseline: Dependent Goal status: 11/13/23 GOAL MET  LONG TERM GOALS: Target date: 12/12/22  Given this patient's Intake score of TBD % on the functional outcomes FOTO tool, patient will experience an increase in function of 5 points to improve basic and instrumental ADLs performance, including lymphedema self-care.  Baseline: Max A Goal status: GOAL DEFERRED. FOTO tool discontinued.  2.  Given this patient's Intake score of 64.71 on the Lymphedema Life Impact Scale (LLIS), patient will experience a reduction of at least 5 points in her perceived level of  functional impairment resulting from lymphedema to improve functional performance and quality of life (QOL). Baseline: 64.71% Goal status: PROGRESSING  3.  Pt will achieve at least a 10% volume reduction in B legs to return limb to typical size and shape, to limit infection risk and LE progression, to decrease pain, to improve function. Baseline: Dependent Goal status: MET FOR L LEG with 10.8% REDUCTION.   4.  Pt will obtain proper compression garments/devices and achieve modified independence (extra time + assistive devices) with donning/doffing to optimize limb volume reductions and limit LE progression over time. Baseline:  Goal status: PROGRESSING. LLE custom garment ordered. PLAN:  OT FREQUENCY: 2x/week  OT DURATION: 12 weeks  PLANNED INTERVENTIONS: 97110-Therapeutic exercises, 97530- Therapeutic activity, 97535- Self Care, 46962- Manual therapy, Patient/Family education, Manual lymph drainage, Compression bandaging, DME instructions, and fitting with compression garments  Custom-made gradient compression garments and HOS  devices are medically necessary because they are uniquely sized and shaped to fit the exact dimensions of the affected extremities, and to provide appropriate medical grade, graduated compression essential for optimally managing chronic, progressive lymphedema. Multiple custom compression garments are needed to ensure proper hygiene to limit infection risk. Custom compression garments should be replaced q 3-6 months When worn consistently for optimal lipo-lymphedema self-management over time. HOS devices, medically necessary to limit fibrosis buildup in tissue, should be replaced q 2 years and PRN when worn out.     Pt should be fit:  3 Left and 3 Right, custom, Jobst, flat knit, Elvarex Classic, ccl 2 or 3, knee length compression stockings with open toes to limit and control chronic leg swelling, to improve lymphatic circulation, to reduce infection risk, to limit formation of tissue fibrosis and to limit progression of lymphedema over time. Custom compression garments should be replaced q 3-6 months when worn consistently for optimal lymphedema self-management.   Pt should be fit with 1 R and 1 L custom, Jobst Relax convoluted Hours-of-Sleep (HOS)  compression device to counteract fluid accumulation and fibrosis formation leading to lymphedema progression. at night. HOS devices, medically necessary to limit fibrosis buildup in tissue, should be replaced q 2 years and PRN when worn out.    PLAN FOR NEXT SESSION:  LLE MLD as established using functional inguinal LN and deep abdominal lymphatics. Multilayer compression bandaging to L leg using 1 each 8, 10 and 12 cm wide x 5 meters long short stretch wrap over single layer of rosidal foam and cotton stockinett using gradient techniques. Pt instructed to remove wraps and report symptoms to her doctor if she experiences acute pain in the limb, signs/symptoms of infection, or atypical sob.  Pt / family edu for LE self care: simple self MLD leg  sequence  Arnold Bicker, MS, OTR/L, CLT-LANA 02/08/24 2:15 PM

## 2024-02-12 ENCOUNTER — Ambulatory Visit: Payer: Self-pay | Admitting: Occupational Therapy

## 2024-02-13 ENCOUNTER — Ambulatory Visit: Admitting: Occupational Therapy

## 2024-02-13 DIAGNOSIS — I890 Lymphedema, not elsewhere classified: Secondary | ICD-10-CM | POA: Diagnosis not present

## 2024-02-13 NOTE — Therapy (Signed)
 OUTPATIENT OCCUPATIONAL THERAPY TREATMENT NOTE  LOWER EXTREMITY LYMPHEDEMA Patient Name: [name] MRN: [id number] DOB:[birth year], 68 y.o., female Today's Date: 02/13/2024  REPORTING PERIOD: 12/21/23 - 02/06/24  END OF SESSION:   OT End of Session - 02/13/24 1409     Visit Number 22    Number of Visits 36    Date for OT Re-Evaluation 03/20/24    OT Start Time 0200    OT Stop Time 0300    OT Time Calculation (min) 60 min             Past Medical History:  Diagnosis Date   Allergy    Anemia    Anxiety    Arthritis    Chronic venous insufficiency 12/04/2015   Depression    Fibromyalgia    History of chicken pox    HTN (hypertension)    MRSA (methicillin resistant Staphylococcus aureus)    Obese    Plantar fasciitis    Pneumonia    Seasonal allergies    Sleep apnea 2012   took test in [place]-told to use cpap-could not ude   Past Surgical History:  Procedure Laterality Date   ANTERIOR CERVICAL DECOMP/DISCECTOMY FUSION  05/09/2008   C5-6, C6-7; with arthrodesis also   GASTRIC BYPASS  10/24/2000   KNEE ARTHROSCOPY  10/24/2002   rt and lt knee   ROTATOR CUFF REPAIR W/ DISTAL CLAVICLE EXCISION  01/06/2006   right   SHOULDER ARTHROSCOPY Left 10/24/2001   TONSILLECTOMY     TOTAL KNEE ARTHROPLASTY  03/29/2004   left   TOTAL KNEE ARTHROPLASTY  11/07/2003   right   TOTAL KNEE REVISION  07/30/2010   right; with lateral release   UPPER GASTROINTESTINAL ENDOSCOPY     Patient Active Problem List   Diagnosis Date Noted   Establishing care with new doctor, encounter for 12/28/2023   Venous stasis ulcer of other part of left lower leg limited to breakdown of skin with varicose veins (HCC) 12/28/2023   Stage 3a chronic kidney disease (HCC) 12/28/2023   Essential hypertension 02/14/2022   CAP (community acquired pneumonia) 06/09/2017   ADHD, 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   Insomnia    Psoriatic arthritis (HCC)    Cough    Chronic anemia 12/05/2015    Hoarding disorder 03/05/2015   Alcohol abuse 11/21/2014   Anxiety state 11/21/2014   Morbid obesity with BMI of 40.0-44.9, adult (HCC) 11/21/2014   Hematemesis 03/07/2014   Depressive disorder 12/09/2013   GAD (generalized anxiety disorder) 09/17/2013   Fibromyalgia 07/22/2013    PCP: Langley Pippin, NP  REFERRING PROVIDER: Haze List, DPM  REFERRING DIAG: I89.0  THERAPY DIAG:  Lymphedema, not elsewhere classified  Rationale for Evaluation and Treatment: Rehabilitation  ONSET DATE: "I've had leg swelling for years. I don't know when it started. " + family hx of leg swelling-mother  SUBJECTIVE:  SUBJECTIVE STATEMENT: ANGLEA GORDNER returns to OT to continue Intensive Phase CDT to LLE. She is accompanied by her spouse, [name]. Pt has no new complaints.  She rates LE-related leg pain as 1/10. Spouse rolls Pt's wraps for her during session.  PERTINENT HISTORY: Pls see active problem list above  PAIN:  Are you having LE related pain? No: NPRS scale: 0/10 Pain location: feet Pain description: burning Aggravating factors: walking Relieving factors: elevation  PRECAUTIONS: Other: LYMPHEDEMA PRECAUTIONS  WEIGHT BEARING RESTRICTIONS: No  FALLS:  Has patient fallen in last 6 months? 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   OBJECTIVE: Note: Objective measures were completed at Evaluation unless otherwise noted.  COGNITION:  Overall cognitive status: frequent redirection needed during eval to stay on topic   POSTURE: WFL  LE ROM: grossly WFL  STRENGTH: grossly WFL   BLE COMPARATIVE LIMB VOLUMETRICS INITIAL 11/01/23  LANDMARK RIGHT  (dominant)  R LEG (A-D) 4296.6 ml  R THIGH (E-G) ml  R FULL LIMB (A-G) ml  Limb Volume differential (LVD)  %  Volume change since initial %  Volume change overall V  (Blank rows = not tested)  LANDMARK LEFT    L LEG (A-D) 4829.1 ml  L  THIGH (E-G) ml  L FULL LIMB (A-G) ml  Limb Volume differential (LVD)   12.4%, L>R  Volume change since initial %  Volume change overall %  (Blank rows = not tested)    9th Visit 12/06/23        LANDMARK LEFT    L LEG (A-D) 4888.8 ml  L  THIGH (E-G) ml  L FULL LIMB (A-G) ml  Limb Volume differential (LVD)     Volume change since initial  + 1.2 %  Volume change overall %  (Blank rows = not tested)    15 th Visit 01/10/24       LANDMARK LEFT    L LEG (A-D) 4637.8 ml  L  THIGH (E-G) ml  L FULL LIMB (A-G) ml  Limb Volume differential (LVD)     Volume change since last measured 12/06/23  DECREASED by 5.14%  Volume change overall %  (Blank rows = not tested)   20th Visit- BLE COMPARATIVE LIMB VOLUMETRICS  02/01/24   LANDMARK LEFT    L LEG (A-D) 4139.2 ml  L  THIGH (E-G) ml  L FULL LIMB (A-G) ml  Limb Volume differential (LVD)     Volume change since initial DECREASED 10.8 %  Volume change overall %  (Blank rows = not tested)    Mild, Stage  II, 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 Obesity  Skin  Description Hyper-Keratosis Peau' de Orange Shiny Tight Fibrotic/ Indurated Fatty Doughy Spongy/ boggy   x    Bilateral Chronic Lipo dermatosclerosis Hardened, thickened, leathery      Skin dry Flaky WNL Macerated   mildly x     Color Redness Varicosities  Blanching Hemosiderin Stain Mottled   x x x  Severe bilaterally x  Odor Malodorous Yeast Fungal infection  WNL      x   Temperature Warm Cool wnl    x     Pitting Edema   1+ 2+ 3+ 4+ Non-pitting         x   Girth Symmetrical Asymmetrical                   Distribution    L>R toes to popliteal     Stemmer Sign Positive Negative   +    Lymphorrhea History Of:  Present Absent     x    Wounds History Of Present Absent Venous Arterial Pressure Sheer     x        Signs of Infection Redness Warmth Erythema Acute Swelling Drainage Borders                    Sensation Light Touch Deep pressure Hypersensitivity Neuropathic pain   In Tact Impaired In tact Impaired Absent Impaired Plantar surfaces bilaterally   x  x  x      Nails WNL   Fungus nail dystrophy     x   Hair Growth Symmetrical Asymmetrical   x    Skin Creases Base of toes  Ankles   Base of Fingers knees       Abdominal pannus Thigh Lobules  Face/neck   x           TODAY'S TREATMENT:                                                                                                                                         LLE/LLQ MLD w Simultaneous skin L LEG multilayer gradient compression wraps  PATIENT EDUCATION:  Continued Pt/ CG edu for lymphedema self care home program throughout session. Topics include outcome of comparative limb volumetrics- starting limb volume differentials (LVDs), technology and gradient techniques used for short stretch, multilayer compression wrapping, simple self-MLD, therapeutic lymphatic pumping exercises, skin/nail care, LE precautions,. compression garment recommendations and specifications, wear and care schedule and compression garment donning / doffing w assistive devices. Discussed progress towards all OT goals since commencing CDT. All questions answered to the Pt's satisfaction. Good return. Person educated: Patient  Education method: Explanation, Demonstration, and  Handouts Education comprehension: verbalized understanding, returned demonstration, verbal cues required, and needs further education  HOME EXERCISE PROGRAM: BLE lymphatic pumping there ex using- 1 sets of 10 reps, each exercise in order-  1-2 x daily, bilaterally Simple self MLD 1 x daily Daily skin care to increase hydration, skin mobility and decrease infection risk- can be done during MLD Compression wraps 23/7 until garment fitting complete   ASSESSMENT  CLINICAL IMPRESSION: Pt continues to make steady progress towards all OT goals for lymphedema care. LLE was slow to respond  to MLD and compression, but in last few weeks limb volume reduction in proximal  leg has accelerated and Pt met 10% reduction goal with 10.8% . Custom LLE compression garment has been measured and ordered. We're awaiting delivery for fitting and training. Please see GOALS section for more detailed review. Today we continues MLD w simultaneous skin care to LLE , then knee length compression wrapping as established. Cont as per POC.    (Initial Eval 09/13/23: Laren Orama is a 68 y.o. 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 Pt denies hx of known, chronic limb swelling in her family. She does not utilize compression garments, or a vasopneumatic compression "pump" as conservative measures. Chronic limb swelling and associated pain limits functional performance in all occupational domains, including functional ambulation and mobility, self-care and basic and instrumental ADLs. It limits participation in leisure pursuits, productive activities and social participle[ation.   Pt will benefit from  skilled Occupational Therapy for both  Intensive and Self -management phases of  Complete Decongestive Therapy (CDT) to reduce limb swelling and associated pain, to limit progression, to reduce infection risk , and to increase functional performance, social participation and quality of life. CDT include manual lymphatic drainage (MLD), skin care, therapeutic exercise and compression therapies- multilayer bandaging initially, one limb at a time,  and then appropriate compression garments, or alternatives in the final phase. Pt and caregivers who assist are trained to perform a;; aspects of Lymphedema self-care top ensure optimal self management over time. Without skilled  OT for CDT, further progression of lymphedema is certain and further functional decline is expected.)   OBJECTIVE IMPAIRMENTS: decreased activity tolerance, decreased knowledge of condition, decreased knowledge of use of DME, increased edema, pain, and chronic leg swelling .   ACTIVITY LIMITATIONS: limited functional ambulation and functional mobility 2/2 lymphedema -related foot and leg pain; standing, squatting, stairs, bathing, toileting, dressing, and hygiene/grooming  PARTICIPATION LIMITATIONS: meal prep, cooking, cleaning, driving, shopping, community activity, and volunteering  PERSONAL FACTORS: Time since onset of injury/illness/exacerbation are also affecting patient's functional outcome.   REHAB POTENTIAL: Good  CLINICAL DECISION MAKING: Evolving/moderate complexity  EVALUATION COMPLEXITY: Moderate   GOALS: Goals reviewed with patient? Yes  SHORT TERM GOALS: Target date: 4th OT Rx visit   Pt will demonstrate understanding of lymphedema precautions and prevention strategies with modified independence using a printed reference to identify at least 5 precautions and discussing how s/he may implement them into daily life to reduce risk of progression with extra time. Baseline:Max A Goal status: 11/20/23 GOAL MET  2.  Pt will be able to apply multilayer, knee length, gradient, compression wraps to  one leg at a time with max caregiver assistance to decrease limb volume, to limit infection risk, and to limit lymphedema progression.  Baseline: Dependent Goal status: 11/13/23 GOAL MET  LONG TERM GOALS: Target date: 12/12/22  Given this patient's Intake score of TBD % on the functional outcomes FOTO tool, patient will experience an increase in function of 5 points to improve basic and instrumental ADLs performance, including lymphedema self-care.  Baseline: Max A Goal status: GOAL DEFERRED. FOTO tool discontinued.  2.  Given this patient's Intake score of 64.71 on the Lymphedema Life Impact Scale (LLIS), patient will experience a reduction of at least 5 points in her perceived level of functional impairment resulting from lymphedema to improve functional performance and quality of life (QOL).  Baseline: 64.71% Goal status: PROGRESSING  3.  Pt will achieve at least a 10% volume reduction in B legs to return limb to typical size and shape, to limit infection risk and LE progression, to decrease pain, to improve function. Baseline: Dependent Goal status: MET FOR L LEG with 10.8% REDUCTION.   4.  Pt will obtain proper compression garments/devices and achieve modified independence (extra time + assistive devices) with donning/doffing to optimize limb volume reductions and limit LE progression over time. Baseline:  Goal status: PROGRESSING. LLE custom garment ordered. PLAN:  OT FREQUENCY: 2x/week  OT DURATION: 12 weeks  PLANNED INTERVENTIONS: 97110-Therapeutic exercises, 97530- Therapeutic activity, 97535- Self Care, 21308- Manual therapy, Patient/Family education, Manual lymph drainage, Compression bandaging, DME instructions, and fitting with compression garments  Custom-made gradient compression garments and HOS devices are medically necessary because they are uniquely sized and shaped to fit the exact dimensions of the affected extremities, and to provide appropriate medical grade,  graduated compression essential for optimally managing chronic, progressive lymphedema. Multiple custom compression garments are needed to ensure proper hygiene to limit infection risk. Custom compression garments should be replaced q 3-6 months When worn consistently for optimal lipo-lymphedema self-management over time. HOS devices, medically necessary to limit fibrosis buildup in tissue, should be replaced q 2 years and PRN when worn out.     Pt should be fit:  3 Left and 3 Right, custom, Jobst, flat knit, Elvarex Classic, ccl 2 or 3, knee length compression stockings with open toes to limit and control chronic leg swelling, to improve lymphatic circulation, to reduce infection risk, to limit formation of tissue fibrosis and to limit progression of lymphedema over time. Custom compression garments should be replaced q 3-6 months when worn consistently for optimal lymphedema self-management.   Pt should be fit with 1 R and 1 L custom, Jobst Relax convoluted Hours-of-Sleep (HOS)  compression device to counteract fluid accumulation and fibrosis formation leading to lymphedema progression. at night. HOS devices, medically necessary to limit fibrosis buildup in tissue, should be replaced q 2 years and PRN when worn out.    PLAN FOR NEXT SESSION:  LLE MLD as established using functional inguinal LN and deep abdominal lymphatics. Multilayer compression bandaging to L leg using 1 each 8, 10 and 12 cm wide x 5 meters long short stretch wrap over single layer of rosidal foam and cotton stockinett using gradient techniques. Pt instructed to remove wraps and report symptoms to her doctor if she experiences acute pain in the limb, signs/symptoms of infection, or atypical sob.  Pt / family edu for LE self care: simple self MLD leg sequence  Arnold Bicker, MS, OTR/L, CLT-LANA 02/13/24 3:02 PM

## 2024-02-14 ENCOUNTER — Ambulatory Visit: Payer: Self-pay | Admitting: Occupational Therapy

## 2024-02-15 ENCOUNTER — Ambulatory Visit: Admitting: Occupational Therapy

## 2024-02-15 DIAGNOSIS — I890 Lymphedema, not elsewhere classified: Secondary | ICD-10-CM | POA: Diagnosis not present

## 2024-02-16 ENCOUNTER — Encounter: Payer: Self-pay | Admitting: Occupational Therapy

## 2024-02-16 NOTE — Therapy (Signed)
 OUTPATIENT OCCUPATIONAL THERAPY TREATMENT NOTE  LOWER EXTREMITY LYMPHEDEMA Patient Name: [name] MRN: [id number] DOB:04/05/1956, 68 y.o., female Today's Date: 02/16/2024  REPORTING PERIOD: 12/21/23 - 02/06/24  END OF SESSION:   OT End of Session - 02/15/24 0757     Visit Number 23    Number of Visits 36    Date for OT Re-Evaluation 03/20/24    OT Start Time 0200    OT Stop Time 0300    OT Time Calculation (min) 60 min    Activity Tolerance Patient tolerated treatment well;No increased pain    Behavior During Therapy WFL for tasks assessed/performed             Past Medical History:  Diagnosis Date   Allergy    Anemia    Anxiety    Arthritis    Chronic venous insufficiency 12/04/2015   Depression    Fibromyalgia    History of chicken pox    HTN (hypertension)    MRSA (methicillin resistant Staphylococcus aureus)    Obese    Plantar fasciitis    Pneumonia    Seasonal allergies    Sleep apnea 2012   took test in [place]-told to use cpap-could not ude   Past Surgical History:  Procedure Laterality Date   ANTERIOR CERVICAL DECOMP/DISCECTOMY FUSION  05/09/2008   C5-6, C6-7; with arthrodesis also   GASTRIC BYPASS  10/24/2000   KNEE ARTHROSCOPY  10/24/2002   rt and lt knee   ROTATOR CUFF REPAIR W/ DISTAL CLAVICLE EXCISION  01/06/2006   right   SHOULDER ARTHROSCOPY Left 10/24/2001   TONSILLECTOMY     TOTAL KNEE ARTHROPLASTY  03/29/2004   left   TOTAL KNEE ARTHROPLASTY  11/07/2003   right   TOTAL KNEE REVISION  07/30/2010   right; with lateral release   UPPER GASTROINTESTINAL ENDOSCOPY     Patient Active Problem List   Diagnosis Date Noted   Establishing care with new doctor, encounter for 12/28/2023   Venous stasis ulcer of other part of left lower leg limited to breakdown of skin with varicose veins (HCC) 12/28/2023   Stage 3a chronic kidney disease (HCC) 12/28/2023   Essential hypertension 02/14/2022   CAP (community acquired pneumonia)  06/09/2017   ADHD, 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 I don't know when it started. " + family hx of leg swelling-mother  SUBJECTIVE:  SUBJECTIVE STATEMENT: [name] returns to OT to continue Intensive Phase CDT to LLE. She is accompanied by her spouse, [name]. Pt has no new complaints.  She rates LE-related leg pain as 1/10. Spouse rolls Pt's wraps for her during session.  PERTINENT HISTORY: Pls see active problem list above  PAIN:  Are you having LE related pain? No: NPRS scale: 0/10 Pain location: feet Pain description: burning Aggravating factors: walking Relieving factors: elevation  PRECAUTIONS: Other: LYMPHEDEMA PRECAUTIONS  WEIGHT BEARING RESTRICTIONS: No  FALLS:  Has patient fallen in last 6 months? 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   OBJECTIVE: Note: Objective measures were completed at Evaluation unless otherwise noted.  COGNITION:  Overall cognitive status: frequent redirection needed during eval to stay on topic   POSTURE: WFL  LE ROM: grossly WFL  STRENGTH: grossly WFL   BLE COMPARATIVE LIMB VOLUMETRICS INITIAL 11/01/23  LANDMARK RIGHT  (dominant)  R LEG (A-D) 4296.6 ml  R THIGH (E-G) ml  R FULL LIMB (A-G) ml  Limb Volume differential (LVD)  %  Volume change since initial %  Volume change overall V  (Blank rows = not tested)  LANDMARK LEFT    L LEG (A-D) 4829.1 ml  L  THIGH (E-G) ml  L FULL LIMB (A-G) ml  Limb Volume differential (LVD)   12.4%, L>R  Volume change since initial %  Volume change overall %  (Blank rows = not tested)    9th Visit 12/06/23        LANDMARK LEFT    L LEG (A-D) 4888.8 ml  L  THIGH (E-G) ml  L FULL LIMB (A-G) ml  Limb Volume differential (LVD)     Volume change since initial  + 1.2 %  Volume change overall %  (Blank rows = not tested)    15 th Visit 01/10/24       LANDMARK LEFT    L LEG (A-D) 4637.8 ml  L  THIGH (E-G) ml  L FULL LIMB (A-G) ml  Limb Volume differential (LVD)     Volume change since last measured 12/06/23  DECREASED by 5.14%  Volume change overall %  (Blank rows = not tested)   20th Visit- BLE COMPARATIVE LIMB VOLUMETRICS  02/01/24   LANDMARK LEFT    L LEG (A-D) 4139.2 ml  L  THIGH (E-G) ml  L FULL LIMB (A-G) ml  Limb Volume differential (LVD)     Volume change since initial DECREASED 10.8 %  Volume change overall %  (Blank rows = not tested)    Mild, Stage  II, 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 Obesity  Skin  Description Hyper-Keratosis Peau' de Orange Shiny Tight Fibrotic/ Indurated Fatty Doughy Spongy/ boggy   x    Bilateral  Chronic Lipo dermatosclerosis Hardened, thickened, leathery      Skin dry Flaky WNL Macerated   mildly x     Color Redness Varicosities Blanching Hemosiderin Stain Mottled   x x x  Severe bilaterally x  Odor Malodorous Yeast Fungal infection  WNL      x   Temperature Warm Cool wnl    x     Pitting Edema   1+ 2+ 3+ 4+ Non-pitting         x   Girth Symmetrical Asymmetrical                   Distribution    L>R toes to popliteal     Stemmer Sign Positive Negative   +    Lymphorrhea History Of:  Present Absent     x    Wounds History Of Present Absent Venous Arterial Pressure Sheer     x        Signs of Infection Redness Warmth Erythema Acute Swelling Drainage Borders                    Sensation Light Touch Deep pressure Hypersensitivity Neuropathic pain   In Tact Impaired In tact Impaired Absent Impaired Plantar surfaces bilaterally   x  x  x      Nails WNL   Fungus nail dystrophy     x   Hair Growth Symmetrical Asymmetrical   x    Skin Creases Base of toes  Ankles   Base of Fingers knees       Abdominal pannus Thigh Lobules  Face/neck   x           TODAY'S TREATMENT:                                                                                                                                         LLE/LLQ MLD w Simultaneous skin L LEG multilayer gradient compression wraps  PATIENT EDUCATION:  Continued Pt/ CG edu for lymphedema self care home program throughout session. Topics include outcome of comparative limb volumetrics- starting limb volume differentials (LVDs), technology and gradient techniques used for short stretch, multilayer compression wrapping, simple self-MLD, therapeutic lymphatic pumping exercises, skin/nail care, LE precautions,. compression garment recommendations and specifications, wear and care schedule and compression garment donning / doffing w assistive devices. Discussed progress towards all OT goals since commencing  CDT. All questions answered to the Pt's satisfaction. Good return. Person educated: Patient  Education method: Explanation, Demonstration, and Handouts Education comprehension: verbalized understanding, returned demonstration, verbal cues required, and needs further education  HOME EXERCISE PROGRAM: BLE lymphatic pumping there ex using- 1 sets of 10 reps, each exercise in order-  1-2 x daily, bilaterally Simple self MLD 1 x daily Daily skin care to increase hydration, skin mobility and decrease infection risk- can be done during MLD Compression wraps 23/7 until garment fitting complete   ASSESSMENT  CLINICAL IMPRESSION: Pt continues to make steady progress towards all OT goals for lymphedema care. LLE was slow to respond  to MLD and compression, but in last few weeks limb volume reduction in proximal  leg has accelerated and Pt met 10% reduction goal with 10.8% . Custom LLE compression garment has been measured and ordered. We're awaiting delivery for fitting and training. Please see GOALS section for more detailed review. Today we continues MLD w simultaneous skin care to LLE , then knee length compression wrapping as established. Cont as per POC.    (Initial Eval 09/13/23: Masayo Fera is a 68 y.o. 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 Pt denies hx of known, chronic limb swelling in her family. She does not utilize compression garments, or a vasopneumatic compression "pump" as conservative measures. Chronic limb swelling and associated pain limits functional performance in all occupational domains, including functional ambulation and mobility, self-care and basic and instrumental ADLs. It limits participation in leisure pursuits, productive  activities and social participle[ation.   Pt will benefit from  skilled Occupational Therapy for both  Intensive and Self -management phases of Complete Decongestive Therapy (CDT) to reduce limb swelling and associated pain, to limit progression, to reduce infection risk , and to increase functional performance, social participation and quality of life. CDT include manual lymphatic drainage (MLD), skin care, therapeutic exercise and compression therapies- multilayer bandaging initially, one limb at a time,  and then appropriate compression garments, or alternatives in the final phase. Pt and caregivers who assist are trained to perform a;; aspects of Lymphedema self-care top ensure optimal self management over time. Without skilled  OT for CDT, further progression of lymphedema is certain and further functional decline is expected.)   OBJECTIVE IMPAIRMENTS: decreased activity tolerance, decreased knowledge of condition, decreased knowledge of use of DME, increased edema, pain, and chronic leg swelling .   ACTIVITY LIMITATIONS: limited functional ambulation and functional mobility 2/2 lymphedema -related foot and leg pain; standing, squatting, stairs, bathing, toileting, dressing, and hygiene/grooming  PARTICIPATION LIMITATIONS: meal prep, cooking, cleaning, driving, shopping, community activity, and volunteering  PERSONAL FACTORS: Time since onset of injury/illness/exacerbation are also affecting patient's functional outcome.   REHAB POTENTIAL: Good  CLINICAL DECISION MAKING: Evolving/moderate complexity  EVALUATION COMPLEXITY: Moderate   GOALS: Goals reviewed with patient? Yes  SHORT TERM GOALS: Target date: 4th OT Rx visit   Pt will demonstrate understanding of lymphedema precautions and prevention strategies with modified independence using a printed reference to identify at least 5 precautions and discussing how s/he may implement them into daily life to reduce risk of progression with  extra time. Baseline:Max A Goal status: 11/20/23 GOAL MET  2.  Pt will be able to apply multilayer, knee length, gradient, compression wraps to one leg at a time with max caregiver assistance to decrease limb volume, to limit infection risk, and to limit lymphedema progression.  Baseline: Dependent Goal status: 11/13/23 GOAL MET  LONG TERM GOALS: Target date: 12/12/22  Given this patient's Intake score of TBD % on the functional outcomes FOTO tool, patient will experience an increase in function of 5 points to improve basic and instrumental ADLs performance, including lymphedema self-care.  Baseline: Max A Goal status: GOAL DEFERRED. FOTO tool discontinued.  2.  Given this patient's Intake score of 64.71 on the Lymphedema Life Impact Scale (LLIS), patient will experience a reduction of at least 5 points in her perceived level of functional impairment resulting from lymphedema to improve functional performance and quality of life (QOL).  Baseline: 64.71% Goal status: PROGRESSING  3.  Pt will achieve at least a 10% volume reduction in B legs to return limb to typical size and shape, to limit infection risk and LE progression, to decrease pain, to improve function. Baseline: Dependent Goal status: MET FOR L LEG with 10.8% REDUCTION.   4.  Pt will obtain proper compression garments/devices and achieve modified independence (extra time + assistive devices) with donning/doffing to optimize limb volume reductions and limit LE progression over time. Baseline:  Goal status: PROGRESSING. LLE custom garment ordered. PLAN:  OT FREQUENCY: 2x/week  OT DURATION: 12 weeks  PLANNED INTERVENTIONS: 97110-Therapeutic exercises, 97530- Therapeutic activity, 97535- Self Care, 40981- Manual therapy, Patient/Family education, Manual lymph drainage, Compression bandaging, DME instructions, and fitting with compression garments  Custom-made gradient compression garments and HOS devices are medically necessary  because they are uniquely sized and shaped to fit the exact dimensions of the affected extremities, and to provide appropriate medical grade, graduated compression essential for optimally managing chronic, progressive lymphedema. Multiple custom compression garments are needed to ensure proper hygiene to limit infection risk. Custom compression garments should be replaced q 3-6 months When worn consistently for optimal lipo-lymphedema self-management over time. HOS devices, medically necessary to limit fibrosis buildup in tissue, should be replaced q 2 years and PRN when worn out.     Pt should be fit:  3 Left and 3 Right, custom, Jobst, flat knit, Elvarex Classic, ccl 2 or 3, knee length compression stockings with open toes to limit and control chronic leg swelling, to improve lymphatic circulation, to reduce infection risk, to limit formation of tissue fibrosis and to limit progression of lymphedema over time. Custom compression garments should be replaced q 3-6 months when worn consistently for optimal lymphedema self-management.   Pt should be fit with 1 R and 1 L custom, Jobst Relax convoluted Hours-of-Sleep (HOS)  compression device to counteract fluid accumulation and fibrosis formation leading to lymphedema progression. at night. HOS devices, medically necessary to limit fibrosis buildup in tissue, should be replaced q 2 years and PRN when worn out.    PLAN FOR NEXT SESSION:  LLE MLD as established using functional inguinal LN and deep abdominal lymphatics. Multilayer compression bandaging to L leg using 1 each 8, 10 and 12 cm wide x 5 meters long short stretch wrap over single layer of rosidal foam and cotton stockinett using gradient techniques. Pt instructed to remove wraps and report symptoms to her doctor if she experiences acute pain in the limb, signs/symptoms of infection, or atypical sob.  Pt / family edu for LE self care: simple self MLD leg sequence  Arnold Bicker, MS, OTR/L,  CLT-LANA 02/16/24 7:58 AM

## 2024-02-19 ENCOUNTER — Ambulatory Visit: Payer: Self-pay | Admitting: Occupational Therapy

## 2024-02-20 ENCOUNTER — Ambulatory Visit: Admitting: Occupational Therapy

## 2024-02-21 ENCOUNTER — Ambulatory Visit: Payer: Self-pay | Admitting: Occupational Therapy

## 2024-02-22 ENCOUNTER — Ambulatory Visit: Admitting: Occupational Therapy

## 2024-02-22 ENCOUNTER — Telehealth: Payer: Self-pay | Admitting: Family Medicine

## 2024-02-22 NOTE — Telephone Encounter (Signed)
 Please remove Dr.Duncan as PCP, since patient has never seen him, or any other PCP at [facility].  [facility] Care Guide [facility] TEAM Direct Dial: [phone]

## 2024-02-26 ENCOUNTER — Ambulatory Visit: Payer: Self-pay | Admitting: Occupational Therapy

## 2024-02-28 ENCOUNTER — Ambulatory Visit: Payer: Self-pay | Admitting: Occupational Therapy

## 2024-02-29 ENCOUNTER — Ambulatory Visit: Admitting: Occupational Therapy

## 2024-03-04 ENCOUNTER — Ambulatory Visit: Payer: Self-pay | Admitting: Occupational Therapy

## 2024-03-05 ENCOUNTER — Ambulatory Visit: Attending: Podiatry | Admitting: Occupational Therapy

## 2024-03-05 DIAGNOSIS — I890 Lymphedema, not elsewhere classified: Secondary | ICD-10-CM | POA: Diagnosis not present

## 2024-03-05 NOTE — Therapy (Signed)
 OUTPATIENT OCCUPATIONAL THERAPY TREATMENT NOTE  LOWER EXTREMITY LYMPHEDEMA Patient Name: [name] MRN: [id number] DOB:January 03, 1956, 68 y.o., female Today's Date: 03/05/2024  REPORTING PERIOD: 12/21/23 - 02/06/24  END OF SESSION:   OT End of Session - 03/05/24 1516     Visit Number 24    Number of Visits 36    Date for OT Re-Evaluation 03/20/24    OT Start Time 0305    OT Stop Time 0405    OT Time Calculation (min) 60 min    Activity Tolerance Patient tolerated treatment well;No increased pain    Behavior During Therapy WFL for tasks assessed/performed             Past Medical History:  Diagnosis Date   Allergy    Anemia    Anxiety    Arthritis    Chronic venous insufficiency 12/04/2015   Depression    Fibromyalgia    History of chicken pox    HTN (hypertension)    MRSA (methicillin resistant Staphylococcus aureus)    Obese    Plantar fasciitis    Pneumonia    Seasonal allergies    Sleep apnea 2012   took test in [place]-told to use cpap-could not ude   Past Surgical History:  Procedure Laterality Date   ANTERIOR CERVICAL DECOMP/DISCECTOMY FUSION  05/09/2008   C5-6, C6-7; with arthrodesis also   GASTRIC BYPASS  10/24/2000   KNEE ARTHROSCOPY  10/24/2002   rt and lt knee   ROTATOR CUFF REPAIR W/ DISTAL CLAVICLE EXCISION  01/06/2006   right   SHOULDER ARTHROSCOPY Left 10/24/2001   TONSILLECTOMY     TOTAL KNEE ARTHROPLASTY  03/29/2004   left   TOTAL KNEE ARTHROPLASTY  11/07/2003   right   TOTAL KNEE REVISION  07/30/2010   right; with lateral release   UPPER GASTROINTESTINAL ENDOSCOPY     Patient Active Problem Berry   Diagnosis Date Noted   Establishing care with new doctor, encounter for 12/28/2023   Venous stasis ulcer of other part of left lower leg limited to breakdown of skin with varicose veins (HCC) 12/28/2023   Stage 3a chronic kidney disease (HCC) 12/28/2023   Essential hypertension 02/14/2022   CAP (community acquired pneumonia)  06/09/2017   ADHD, 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 I don't know when it started. " + family hx of leg swelling-mother  SUBJECTIVE:  SUBJECTIVE STATEMENT: [name] returns to OT to continue Intensive Phase CDT to LLE. She is accompanied by her spouse, [name]. Pt was last seen on 02/15/24.  She missed ~ 2 1/2 weeks of OT due to vacation and foster daughter's illness. She rates LE-related leg pain as 4/10.   PERTINENT HISTORY: Pls see active problem Berry above  PAIN:  Are you having LE related pain? No: NPRS scale: 4/10 Pain location: feet Pain description: burning Aggravating factors: walking Relieving factors: elevation  PRECAUTIONS: Other: LYMPHEDEMA PRECAUTIONS  WEIGHT BEARING RESTRICTIONS: No  FALLS:  Has patient fallen in last 6 months? 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   OBJECTIVE: Note: Objective measures were completed at Evaluation unless otherwise noted.  COGNITION:  Overall cognitive status: frequent redirection needed during eval to stay on topic   POSTURE: WFL  LE ROM: grossly WFL  STRENGTH: grossly WFL   BLE COMPARATIVE LIMB VOLUMETRICS INITIAL 11/01/23  LANDMARK RIGHT  (dominant)  R LEG (A-D) 4296.6 ml  R THIGH (E-G) ml  R FULL LIMB (A-G) ml  Limb Volume differential (LVD)  %  Volume change since initial %  Volume change overall V  (Blank rows = not tested)  LANDMARK LEFT    L LEG (A-D) 4829.1 ml  L  THIGH (E-G) ml  L FULL LIMB (A-G) ml  Limb Volume differential (LVD)   12.4%, L>R  Volume change since initial %  Volume change overall %  (Blank rows = not tested)    9th Visit 12/06/23        LANDMARK LEFT    L LEG (A-D) 4888.8 ml  L  THIGH (E-G) ml  L FULL LIMB (A-G) ml  Limb Volume differential (LVD)     Volume change since initial  + 1.2 %  Volume change overall %  (Blank rows = not tested)    15 th Visit 01/10/24       LANDMARK LEFT    L LEG (A-D) 4637.8 ml  L  THIGH (E-G) ml  L FULL LIMB (A-G) ml  Limb Volume differential (LVD)     Volume change since last measured 12/06/23  DECREASED by 5.14%  Volume change overall %  (Blank rows = not tested)   20th Visit- BLE COMPARATIVE LIMB VOLUMETRICS  02/01/24   LANDMARK LEFT    L LEG (A-D) 4139.2 ml  L  THIGH (E-G) ml  L FULL LIMB (A-G) ml  Limb Volume differential (LVD)     Volume change since initial DECREASED 10.8 %  Volume change overall %  (Blank rows = not tested)    Mild, Stage  II, 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 Obesity  Skin  Description Hyper-Keratosis Peau' de Orange Shiny Tight Fibrotic/ Indurated Fatty Doughy  Spongy/ boggy   x    Bilateral Chronic Lipo dermatosclerosis Hardened, thickened, leathery      Skin dry Flaky WNL Macerated   mildly x     Color Redness Varicosities Blanching Hemosiderin Stain Mottled  x x x  Severe bilaterally x   Odor Malodorous Yeast Fungal infection  WNL      x   Temperature Warm Cool wnl    x     Pitting Edema   1+ 2+ 3+ 4+ Non-pitting         x   Girth Symmetrical Asymmetrical                   Distribution    L>R toes to popliteal     Stemmer Sign Positive Negative   +    Lymphorrhea History Of:  Present Absent     x    Wounds History Of Present Absent Venous Arterial Pressure Sheer     x        Signs of Infection Redness Warmth Erythema Acute Swelling Drainage Borders                    Sensation Light Touch Deep pressure Hypersensitivity Neuropathic pain   In Tact Impaired In tact Impaired Absent Impaired Plantar surfaces bilaterally   x  x  x      Nails WNL   Fungus nail dystrophy     x   Hair Growth Symmetrical Asymmetrical   x    Skin Creases Base of toes  Ankles   Base of Fingers knees       Abdominal pannus Thigh Lobules  Face/neck   x           TODAY'S TREATMENT:                                                                                                                                         LLE/LLQ MLD w Simultaneous skin L LEG multilayer gradient compression wraps  PATIENT EDUCATION:  Continued Pt/ CG edu for lymphedema self care home program throughout session. Topics include outcome of comparative limb volumetrics- starting limb volume differentials (LVDs), technology and gradient techniques used for short stretch, multilayer compression wrapping, simple self-MLD, therapeutic lymphatic pumping exercises, skin/nail care, LE precautions,. compression garment recommendations and specifications, wear and care schedule and compression garment donning / doffing w assistive devices. Discussed progress towards  all OT goals since commencing CDT. All questions answered to the Pt's satisfaction. Good return. Person educated: Patient  Education method: Explanation, Demonstration, and Handouts Education comprehension: verbalized understanding, returned demonstration, verbal cues required, and needs further education  HOME EXERCISE PROGRAM: BLE lymphatic pumping there ex using- 1 sets of 10 reps, each exercise in order-  1-2 x daily, bilaterally Simple self MLD 1 x daily Daily skin care to increase hydration, skin mobility and decrease infection risk- can be done during MLD Compression wraps 23/7 until garment fitting complete   ASSESSMENT  CLINICAL IMPRESSION: LLE more swollen than typically with 3 small superficial abrasions on anterior distal leg. These APPEAR TO BE LEAKING SEROUS FLUID , RATHER THAN  LYMPH. Continued MLD w simultaneous skin care to LLE , then knee length compression wrapping as established. Fit custom LLE compression garment ( ordered in early April) before moving on to Rx of RLE. Cont as per POC.    (Initial Eval 09/13/23: Jamai Fest is a 68 y.o. 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 Pt denies hx of known, chronic limb swelling in her family. She does not utilize compression garments, or a vasopneumatic compression "pump" as conservative measures. Chronic limb swelling and associated pain limits functional performance in all occupational domains, including functional ambulation and mobility, self-care and basic and instrumental ADLs. It limits participation in leisure pursuits, productive activities and social participle[ation.   Pt will benefit from  skilled Occupational Therapy for both  Intensive and Self -management phases of  Complete Decongestive Therapy (CDT) to reduce limb swelling and associated pain, to limit progression, to reduce infection risk , and to increase functional performance, social participation and quality of life. CDT include manual lymphatic drainage (MLD), skin care, therapeutic exercise and compression therapies- multilayer bandaging initially, one limb at a time,  and then appropriate compression garments, or alternatives in the final phase. Pt and caregivers who assist are trained to perform a;; aspects of Lymphedema self-care top ensure optimal self management over time. Without skilled  OT for CDT, further progression of lymphedema is certain and further functional decline is expected.)   OBJECTIVE IMPAIRMENTS: decreased activity tolerance, decreased knowledge of condition, decreased knowledge of use of DME, increased edema, pain, and chronic leg swelling.   ACTIVITY LIMITATIONS: limited functional ambulation and functional mobility 2/2 lymphedema -related foot and leg pain; standing, squatting, stairs, bathing, toileting, dressing, and hygiene/grooming  PARTICIPATION LIMITATIONS: meal prep, cooking, cleaning, driving, shopping, community activity, and volunteering  PERSONAL FACTORS: Time since onset of injury/illness/exacerbation are also affecting patient's functional outcome.   REHAB POTENTIAL: Good  CLINICAL DECISION MAKING: Evolving/moderate complexity  EVALUATION COMPLEXITY: Moderate   GOALS: Goals reviewed with patient? Yes  SHORT TERM GOALS: Target date: 4th OT Rx visit   Pt will demonstrate understanding of lymphedema precautions and prevention strategies with modified independence using a printed reference to identify at least 5 precautions and discussing how s/he may implement them into daily life to reduce risk of progression with extra time. Baseline:Max A Goal status: 11/20/23 GOAL MET  2.  Pt will be able to apply multilayer, knee length, gradient, compression wraps to  one leg at a time with max caregiver assistance to decrease limb volume, to limit infection risk, and to limit lymphedema progression.  Baseline: Dependent Goal status: 11/13/23 GOAL MET  LONG TERM GOALS: Target date: 12/12/22  Given this patient's Intake score of TBD % on the functional outcomes FOTO tool, patient will experience an increase in function of 5 points to improve basic and instrumental ADLs performance, including lymphedema self-care.  Baseline: Max A Goal status: GOAL DEFERRED. FOTO tool discontinued.  2.  Given this patient's Intake score of 64.71 on the Lymphedema Life Impact Scale (LLIS), patient will experience a reduction of at least 5 points in her perceived level of functional impairment resulting from lymphedema to improve functional performance and quality of life (QOL). Baseline: 64.71% Goal status: PROGRESSING  3.  Pt will achieve at least a 10% volume reduction in B legs to  return limb to typical size and shape, to limit infection risk and LE progression, to decrease pain, to improve function. Baseline: Dependent Goal status: MET FOR L LEG with 10.8% REDUCTION.   4.  Pt will obtain proper compression garments/devices and achieve modified independence (extra time + assistive devices) with donning/doffing to optimize limb volume reductions and limit LE progression over time. Baseline:  Goal status: PROGRESSING. LLE custom garment ordered. PLAN:  OT FREQUENCY: 2x/week  OT DURATION: 12 weeks  PLANNED INTERVENTIONS: 97110-Therapeutic exercises, 97530- Therapeutic activity, 97535- Self Care, [id number]- Manual therapy, Patient/Family education, Manual lymph drainage, Compression bandaging, DME instructions, and fitting with compression garments  Custom-made gradient compression garments and HOS devices are medically necessary because they are uniquely sized and shaped to fit the exact dimensions of the affected extremities, and to provide appropriate medical grade,  graduated compression essential for optimally managing chronic, progressive lymphedema. Multiple custom compression garments are needed to ensure proper hygiene to limit infection risk. Custom compression garments should be replaced q 3-6 months When worn consistently for optimal lipo-lymphedema self-management over time. HOS devices, medically necessary to limit fibrosis buildup in tissue, should be replaced q 2 years and PRN when worn out.     Pt should be fit:  3 Left and 3 Right, custom, Jobst, flat knit, Elvarex Classic, ccl 2 or 3, knee length compression stockings with open toes to limit and control chronic leg swelling, to improve lymphatic circulation, to reduce infection risk, to limit formation of tissue fibrosis and to limit progression of lymphedema over time. Custom compression garments should be replaced q 3-6 months when worn consistently for optimal lymphedema self-management.   Pt should be fit with 1 R and 1 L custom, Jobst Relax convoluted Hours-of-Sleep (HOS)  compression device to counteract fluid accumulation and fibrosis formation leading to lymphedema progression. at night. HOS devices, medically necessary to limit fibrosis buildup in tissue, should be replaced q 2 years and PRN when worn out.    PLAN FOR NEXT SESSION:  LLE MLD as established using functional inguinal LN and deep abdominal lymphatics. Multilayer compression bandaging to L leg using 1 each 8, 10 and 12 cm wide x 5 meters long short stretch wrap over single layer of rosidal foam and cotton stockinett using gradient techniques. Pt instructed to remove wraps and report symptoms to her doctor if she experiences acute pain in the limb, signs/symptoms of infection, or atypical sob.  Pt / family edu for LE self care: simple self MLD leg sequence  Arnold Bicker, MS, OTR/L, CLT-LANA 03/05/24 4:01 PM

## 2024-03-06 ENCOUNTER — Ambulatory Visit: Payer: Self-pay | Admitting: Occupational Therapy

## 2024-03-07 ENCOUNTER — Ambulatory Visit: Admitting: Occupational Therapy

## 2024-03-07 DIAGNOSIS — I890 Lymphedema, not elsewhere classified: Secondary | ICD-10-CM | POA: Diagnosis not present

## 2024-03-07 NOTE — Therapy (Unsigned)
 OUTPATIENT OCCUPATIONAL THERAPY TREATMENT NOTE  LOWER EXTREMITY LYMPHEDEMA Patient Name: [name] MRN: [id number] DOB:May 09, 1956, 68 y.o., female Today's Date: 03/08/2024  END OF SESSION:   OT End of Session - 03/07/24 1508     Visit Number 25    Number of Visits 36    Date for OT Re-Evaluation 03/20/24    OT Start Time 0306    OT Stop Time 0410    OT Time Calculation (min) 64 min    Activity Tolerance Patient tolerated treatment well;No increased pain    Behavior During Therapy WFL for tasks assessed/performed             Past Medical History:  Diagnosis Date   Allergy    Anemia    Anxiety    Arthritis    Chronic venous insufficiency 12/04/2015   Depression    Fibromyalgia    History of chicken pox    HTN (hypertension)    MRSA (methicillin resistant Staphylococcus aureus)    Obese    Plantar fasciitis    Pneumonia    Seasonal allergies    Sleep apnea 2012   took test in [place]-told to use cpap-could not ude   Past Surgical History:  Procedure Laterality Date   ANTERIOR CERVICAL DECOMP/DISCECTOMY FUSION  05/09/2008   C5-6, C6-7; with arthrodesis also   GASTRIC BYPASS  10/24/2000   KNEE ARTHROSCOPY  10/24/2002   rt and lt knee   ROTATOR CUFF REPAIR W/ DISTAL CLAVICLE EXCISION  01/06/2006   right   SHOULDER ARTHROSCOPY Left 10/24/2001   TONSILLECTOMY     TOTAL KNEE ARTHROPLASTY  03/29/2004   left   TOTAL KNEE ARTHROPLASTY  11/07/2003   right   TOTAL KNEE REVISION  07/30/2010   right; with lateral release   UPPER GASTROINTESTINAL ENDOSCOPY     Patient Active Problem List   Diagnosis Date Noted   Establishing care with new doctor, encounter for 12/28/2023   Venous stasis ulcer of other part of left lower leg limited to breakdown of skin with varicose veins (HCC) 12/28/2023   Stage 3a chronic kidney disease (HCC) 12/28/2023   Essential hypertension 02/14/2022   CAP (community acquired pneumonia) 06/09/2017   ADHD, 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 I don't know when it started. " + family hx of leg swelling-mother  SUBJECTIVE:  SUBJECTIVE STATEMENT: [name] returns to OT to continue Intensive Phase CDT to LLE. She is accompanied by her spouse, [name]. Pt did not call primary care doctor for a requesting a compression garment referral as instructed. Apparently the provider who referred her to OT for lymphedema care was a one time visit and her primary referring provider was recently replaced. Pt instructed to work with DME provider on obtaining prescription for garments ordered over 6 weeks ago.  PERTINENT HISTORY: Pls see active problem list above  PAIN:  Are you having LE related pain? No: NPRS scale: 4/10 Pain location: feet Pain description: burning Aggravating factors: walking Relieving factors: elevation  PRECAUTIONS: Other: LYMPHEDEMA PRECAUTIONS  WEIGHT BEARING RESTRICTIONS: No  FALLS:  Has patient fallen in last 6 months? 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   OBJECTIVE: Note: Objective measures were completed at Evaluation unless otherwise noted.  COGNITION:  Overall cognitive status: frequent redirection needed during eval to stay on topic   POSTURE: WFL  LE ROM: grossly WFL  STRENGTH: grossly WFL   BLE COMPARATIVE LIMB VOLUMETRICS INITIAL 11/01/23  LANDMARK RIGHT  (dominant)  R LEG (A-D) 4296.6 ml  R THIGH (E-G) ml  R FULL LIMB (A-G) ml  Limb Volume differential (LVD)  %  Volume change since initial %  Volume change overall V  (Blank rows = not tested)  LANDMARK LEFT    L LEG (A-D) 4829.1 ml  L  THIGH (E-G) ml  L FULL LIMB (A-G) ml  Limb Volume differential (LVD)   12.4%, L>R  Volume change since initial %  Volume change overall %  (Blank rows = not tested)    9th Visit 12/06/23        LANDMARK LEFT    L LEG (A-D) 4888.8 ml  L  THIGH (E-G) ml  L FULL LIMB (A-G) ml  Limb Volume differential (LVD)     Volume change since initial  + 1.2 %  Volume change overall %  (Blank rows = not tested)    15 th Visit 01/10/24       LANDMARK LEFT    L LEG (A-D) 4637.8 ml  L  THIGH (E-G) ml  L FULL LIMB (A-G) ml  Limb Volume differential (LVD)     Volume change since last measured 12/06/23  DECREASED by 5.14%  Volume change overall %  (Blank rows = not tested)   20th Visit- BLE COMPARATIVE LIMB VOLUMETRICS  02/01/24   LANDMARK LEFT    L LEG (A-D) 4139.2 ml  L  THIGH (E-G) ml  L FULL LIMB (A-G) ml  Limb Volume differential (LVD)     Volume change since initial DECREASED 10.8 %  Volume change overall %  (Blank rows = not tested)    Mild, Stage  II,  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 Obesity  Skin  Description Hyper-Keratosis Peau' de Orange Shiny Tight Fibrotic/ Indurated Fatty Doughy Spongy/ boggy   x    Bilateral Chronic Lipo dermatosclerosis Hardened, thickened, leathery  Skin dry Flaky WNL Macerated   mildly x     Color Redness Varicosities Blanching Hemosiderin Stain Mottled   x x x  Severe bilaterally x   Odor Malodorous Yeast Fungal infection  WNL      x   Temperature Warm Cool wnl    x     Pitting Edema   1+ 2+ 3+ 4+ Non-pitting         x   Girth Symmetrical Asymmetrical                   Distribution    L>R toes to popliteal     Stemmer Sign Positive Negative   +    Lymphorrhea History Of:  Present Absent     x    Wounds History Of Present Absent Venous Arterial Pressure Sheer     x        Signs of Infection Redness Warmth Erythema Acute Swelling Drainage Borders                    Sensation Light Touch Deep pressure Hypersensitivity Neuropathic pain   In Tact Impaired In tact Impaired Absent Impaired Plantar surfaces bilaterally   x  x  x      Nails WNL   Fungus nail dystrophy     x   Hair Growth Symmetrical Asymmetrical   x    Skin Creases Base of toes  Ankles   Base of Fingers knees       Abdominal pannus Thigh Lobules  Face/neck   x           TODAY'S TREATMENT:                                                                                                                                         LLE/LLQ MLD w Simultaneous skin L LEG multilayer gradient compression wraps  PATIENT EDUCATION:  Continued Pt/ CG edu for lymphedema self care home program throughout session. Topics include outcome of comparative limb volumetrics- starting limb volume differentials (LVDs), technology and gradient techniques used for short stretch, multilayer compression wrapping, simple self-MLD, therapeutic lymphatic pumping exercises, skin/nail care, LE precautions,. compression  garment recommendations and specifications, wear and care schedule and compression garment donning / doffing w assistive devices. Discussed progress towards all OT goals since commencing CDT. All questions answered to the Pt's satisfaction. Good return. Person educated: Patient  Education method: Explanation, Demonstration, and Handouts Education comprehension: verbalized understanding, returned demonstration, verbal cues required, and needs further education  HOME EXERCISE PROGRAM: BLE lymphatic pumping there ex using- 1 sets of 10 reps, each exercise in order-  1-2 x daily, bilaterally Simple self MLD 1 x daily Daily skin care to increase hydration, skin mobility and decrease infection risk- can be done during MLD Compression wraps 23/7 until garment fitting complete   ASSESSMENT  CLINICAL IMPRESSION: Open areas  on anterior L leg  observed last session are scabbed over and healing well.  Continued MLD w simultaneous skin care to LLE , then knee length compression wrapping as established. Fit custom LLE compression garment ( ordered in early April) before moving on to Rx of RLE. Cont as per POC.    (Initial Eval 09/13/23: Aimee Richards is a 67 y.o. 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 Pt denies hx of known, chronic limb swelling in her family. She does not utilize compression garments, or a vasopneumatic compression "pump" as conservative measures. Chronic limb swelling and associated pain limits functional performance in all occupational domains, including functional ambulation and mobility, self-care and basic and instrumental ADLs. It limits participation in leisure pursuits, productive activities and social participle[ation.   Pt will  benefit from  skilled Occupational Therapy for both  Intensive and Self -management phases of Complete Decongestive Therapy (CDT) to reduce limb swelling and associated pain, to limit progression, to reduce infection risk , and to increase functional performance, social participation and quality of life. CDT include manual lymphatic drainage (MLD), skin care, therapeutic exercise and compression therapies- multilayer bandaging initially, one limb at a time,  and then appropriate compression garments, or alternatives in the final phase. Pt and caregivers who assist are trained to perform a;; aspects of Lymphedema self-care top ensure optimal self management over time. Without skilled  OT for CDT, further progression of lymphedema is certain and further functional decline is expected.)   OBJECTIVE IMPAIRMENTS: decreased activity tolerance, decreased knowledge of condition, decreased knowledge of use of DME, increased edema, pain, and chronic leg swelling.   ACTIVITY LIMITATIONS: limited functional ambulation and functional mobility 2/2 lymphedema -related foot and leg pain; standing, squatting, stairs, bathing, toileting, dressing, and hygiene/grooming  PARTICIPATION LIMITATIONS: meal prep, cooking, cleaning, driving, shopping, community activity, and volunteering  PERSONAL FACTORS: Time since onset of injury/illness/exacerbation are also affecting patient's functional outcome.   REHAB POTENTIAL: Good  CLINICAL DECISION MAKING: Evolving/moderate complexity  EVALUATION COMPLEXITY: Moderate   GOALS: Goals reviewed with patient? Yes  SHORT TERM GOALS: Target date: 4th OT Rx visit   Pt will demonstrate understanding of lymphedema precautions and prevention strategies with modified independence using a printed reference to identify at least 5 precautions and discussing how s/he may implement them into daily life to reduce risk of progression with extra time. Baseline:Max A Goal status: 11/20/23 GOAL  MET  2.  Pt will be able to apply multilayer, knee length, gradient, compression wraps to one leg at a time with max caregiver assistance to decrease limb volume, to limit infection risk, and to limit lymphedema progression.  Baseline: Dependent Goal status: 11/13/23 GOAL MET  LONG TERM GOALS: Target date: 12/12/22  Given this patient's Intake score of TBD % on the functional outcomes FOTO tool, patient will experience an increase in function of 5 points to improve basic and instrumental ADLs performance, including lymphedema self-care.  Baseline: Max A Goal status: GOAL DEFERRED. FOTO tool discontinued.  2.  Given this patient's Intake score of 64.71 on the Lymphedema Life Impact Scale (LLIS), patient will experience a reduction of at least 5 points in her perceived level of functional impairment resulting from lymphedema to improve functional performance and quality of life (QOL). Baseline: 64.71% Goal status: PROGRESSING  3.  Pt will achieve at least a 10% volume reduction in B legs to return limb to typical size and shape, to limit infection risk and LE progression, to decrease pain, to improve function. Baseline: Dependent Goal status: MET FOR L LEG with 10.8% REDUCTION.   4.  Pt will obtain proper compression garments/devices and achieve modified independence (extra time + assistive devices) with donning/doffing to optimize limb volume reductions and limit LE progression over time. Baseline:  Goal status: PROGRESSING. LLE custom garment ordered. PLAN:  OT FREQUENCY: 2x/week  OT DURATION: 12 weeks  PLANNED INTERVENTIONS: 97110-Therapeutic exercises, 97530- Therapeutic activity, 97535- Self Care, 16109- Manual therapy, Patient/Family education, Manual lymph drainage, Compression bandaging, DME instructions, and fitting with compression garments  Custom-made gradient compression garments and HOS devices are medically necessary because they are uniquely sized and shaped to fit the  exact dimensions of the affected extremities, and to provide appropriate medical grade, graduated compression essential for optimally managing chronic, progressive lymphedema. Multiple custom compression garments are needed to ensure proper hygiene to limit infection risk. Custom compression garments should be replaced q 3-6 months When worn consistently for optimal lipo-lymphedema self-management over time. HOS devices, medically necessary to limit fibrosis buildup in tissue, should be replaced q 2 years and PRN when worn out.     Pt should be fit:  3 Left and 3 Right, custom, Jobst, flat knit, Elvarex Classic, ccl 2 or 3, knee length compression stockings with open toes to limit and control chronic leg swelling, to improve lymphatic circulation, to reduce infection risk, to limit formation of tissue fibrosis and to limit progression of lymphedema over time. Custom compression garments should be replaced q 3-6 months when worn consistently for optimal lymphedema self-management.   Pt should be fit with 1 R and 1 L custom, Jobst Relax convoluted Hours-of-Sleep (HOS)  compression device to counteract fluid accumulation and fibrosis formation leading to lymphedema progression. at night. HOS devices, medically necessary to limit fibrosis buildup in tissue, should be replaced q 2 years and PRN when worn out.    PLAN FOR NEXT SESSION:  LLE MLD as established using functional inguinal LN and deep abdominal lymphatics. Multilayer compression bandaging to L leg using 1 each 8, 10 and 12 cm wide x 5 meters long short stretch wrap over single layer of rosidal foam and cotton stockinett using gradient techniques. Pt instructed to remove wraps and report symptoms to her doctor if she experiences acute pain in the limb, signs/symptoms of infection, or atypical sob.  Pt / family edu for LE self care: simple self MLD leg sequence  Arnold Bicker, MS, OTR/L, CLT-LANA 03/08/24 12:31 PM

## 2024-03-11 ENCOUNTER — Other Ambulatory Visit: Payer: Self-pay | Admitting: General Practice

## 2024-03-11 ENCOUNTER — Encounter: Payer: Self-pay | Admitting: General Practice

## 2024-03-11 ENCOUNTER — Ambulatory Visit: Payer: Self-pay | Admitting: Occupational Therapy

## 2024-03-11 ENCOUNTER — Ambulatory Visit (INDEPENDENT_AMBULATORY_CARE_PROVIDER_SITE_OTHER): Admitting: General Practice

## 2024-03-11 VITALS — BP 116/80 | HR 70 | Temp 97.7°F | Ht 66.0 in | Wt 244.0 lb

## 2024-03-11 DIAGNOSIS — F32A Depression, unspecified: Secondary | ICD-10-CM | POA: Diagnosis not present

## 2024-03-11 DIAGNOSIS — Z23 Encounter for immunization: Secondary | ICD-10-CM | POA: Diagnosis not present

## 2024-03-11 DIAGNOSIS — Z6841 Body Mass Index (BMI) 40.0 and over, adult: Secondary | ICD-10-CM | POA: Diagnosis not present

## 2024-03-11 MED ORDER — DULOXETINE HCL 30 MG PO CPEP: 30.0000 mg | ORAL_CAPSULE | Freq: Every day | ORAL | 0 refills | Status: DC

## 2024-03-11 MED ORDER — DULOXETINE HCL 60 MG PO CPEP: 60.0000 mg | ORAL_CAPSULE | Freq: Every day | ORAL | 0 refills | Status: DC

## 2024-03-11 MED ORDER — ZEPBOUND 12.5 MG/0.5ML ~~LOC~~ SOAJ: 12.5000 mg | SUBCUTANEOUS | 0 refills | Status: DC

## 2024-03-11 NOTE — Progress Notes (Signed)
 Established Patient Office Visit  Subjective   Patient ID: [name], female    DOB: Apr 06, 1956  Age: 68 y.o. MRN: [id number]  Chief Complaint  Patient presents with   Medication Refill    Needs Cymbalta  refilled or changed; patient states she may need to be switched to something else as she doesn't feel like its helping anymore.      Medication Refill Pertinent negatives include no abdominal pain, chest pain, chills, fever, headaches, nausea or vomiting.    [name] is a 68 year old female with past medical history of hypertension, venous stasis ulcer of left lower leg, stage IIIa chronic kidney disease, cirrhotic arthritis, depressive order, anxiety, alcohol abuse, fibromyalgia, morbid obesity presents today for medication refill.  Depression: Diagnosed several years ago. Currently managed on Cymbalta  60 mg once daily.  Reports and has not been helping anymore and would like to switch to something else. She has an autistic care taker and does struggle with depression. Her symptoms are more related to depression. She denies SI/HI.  Weight loss: she has been on Zepbound  10 mg once weekly. She has been experiencing diarrhea on and off. Her last weight on 12/28/23-247 lb. Her lowest weight was 238lb. Today her weight is 244 lb. She would like to increase her Zepbound . She has been paying for her out of pocket. She would like to continue the medication. She will call around to find out which pharmacy has it the cheapest. She may need to go back to 10 mg due to cost and get it directly from [facility].   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   Insomnia    Psoriatic  arthritis (HCC)    Cough    Chronic anemia 12/05/2015   Hoarding disorder 03/05/2015   Alcohol abuse 11/21/2014   Anxiety state 11/21/2014   Obesity 11/21/2014   Hematemesis 03/07/2014   Depressive disorder 12/09/2013   GAD (generalized anxiety disorder) 09/17/2013   Fibromyalgia 07/22/2013   Past Medical History:  Diagnosis Date   Allergy    Anemia    Anxiety    Arthritis    Chronic venous insufficiency 12/04/2015   Depression    Fibromyalgia    History of chicken pox    HTN (hypertension)    MRSA (methicillin resistant Staphylococcus aureus)    Obese    Plantar fasciitis    Pneumonia    Seasonal allergies    Sleep apnea 2012   took test in [place]-told to use cpap-could not ude   Past Surgical History:  Procedure Laterality Date   ANTERIOR CERVICAL DECOMP/DISCECTOMY FUSION  05/09/2008   C5-6, C6-7; with arthrodesis also   GASTRIC BYPASS  10/24/2000   KNEE ARTHROSCOPY  10/24/2002   rt and lt knee   ROTATOR CUFF REPAIR W/ DISTAL CLAVICLE EXCISION  01/06/2006   right   SHOULDER ARTHROSCOPY Left 10/24/2001   TONSILLECTOMY     TOTAL KNEE ARTHROPLASTY  03/29/2004   left   TOTAL KNEE ARTHROPLASTY  11/07/2003   right   TOTAL KNEE REVISION  07/30/2010   right; with lateral release   UPPER GASTROINTESTINAL ENDOSCOPY     No Known Allergies  03/11/2024   11:41 AM 12/28/2023   12:25 PM 02/14/2022   12:11 PM  Depression screen PHQ 2/9  Decreased Interest 1 0 2  Down, Depressed, Hopeless 1 0 1  PHQ - 2 Score 2 0 3  Altered sleeping 1 0 0  Tired, decreased energy 3 0 3  Change in appetite 1 0 0  Feeling bad or failure about yourself  1 0 0  Trouble concentrating 1 0 0  Moving slowly or fidgety/restless 0 0 0  Suicidal thoughts 0 0 0  PHQ-9 Score 9 0 6  Difficult doing work/chores Somewhat difficult Not difficult at all        03/11/2024   11:41 AM 12/28/2023   12:25 PM  GAD 7 : Generalized Anxiety Score  Nervous, Anxious, on Edge 0 0  Control/stop  worrying 0 0  Worry too much - different things 1 0  Trouble relaxing 0 0  Restless 0 0  Easily annoyed or irritable 0 0  Afraid - awful might happen 0 0  Total GAD 7 Score 1 0  Anxiety Difficulty Not difficult at all Not difficult at all      Review of Systems  Constitutional:  Negative for chills and fever.  Respiratory:  Negative for shortness of breath.   Cardiovascular:  Negative for chest pain.  Gastrointestinal:  Negative for abdominal pain, constipation, diarrhea, heartburn, nausea and vomiting.  Genitourinary:  Negative for dysuria, frequency and urgency.  Neurological:  Negative for dizziness and headaches.  Endo/Heme/Allergies:  Negative for polydipsia.  Psychiatric/Behavioral:  Negative for depression and suicidal ideas. The patient is not nervous/anxious.       Objective:     BP 116/80 (BP Location: Left Arm, Patient Position: Sitting, Cuff Size: Large)   Pulse 70   Temp 97.7 F (36.5 C) (Oral)   Ht 5\' 6"  (1.676 m)   Wt 244 lb (110.7 kg)   SpO2 96%   BMI 39.38 kg/m  BP Readings from Last 3 Encounters:  03/11/24 116/80  12/28/23 130/86  11/02/23 (!) 162/82   Wt Readings from Last 3 Encounters:  03/11/24 244 lb (110.7 kg)  12/28/23 247 lb (112 kg)  11/02/23 265 lb (120.2 kg)      Physical Exam Vitals and nursing note reviewed.  Constitutional:      Appearance: Normal appearance.  Cardiovascular:     Rate and Rhythm: Normal rate and regular rhythm.     Pulses: Normal pulses.     Heart sounds: Normal heart sounds.  Pulmonary:     Effort: Pulmonary effort is normal.     Breath sounds: Normal breath sounds.  Neurological:     Mental Status: She is alert and oriented to person, place, and time.  Psychiatric:        Mood and Affect: Mood normal.        Behavior: Behavior normal.        Thought Content: Thought content normal.        Judgment: Judgment normal.      No results found for any visits on 03/11/24.   The 10-year ASCVD risk score  (Arnett DK, et al., 2019) is: 5.6%    Assessment & Plan:  Morbid obesity with BMI of 40.0-44.9, adult [facility]) Assessment & Plan: Discussed at length with patient especially dietary changes and increasing exercise.   Continue Zepbound .  Rx sent for zepbound  12.5 mg once weekly for four weeks.  Orders: -     Zepbound ; Inject 12.5 mg into  the skin once a week.  Dispense: 2 mL; Refill: 0  Depressive disorder Assessment & Plan: Uncontrolled.   Discussed treatment options with patient. Denies SI/HI.  Increase Cymbalta  60 mg to 90 mg once daily.   Rx sent for 60 mg once daily.  Rx sent for 30 mg once daily.   Follow up in 4 weeks.  Orders: -     DULoxetine  HCl; Take 1 capsule (60 mg total) by mouth daily.  Dispense: 30 capsule; Refill: 0 -     DULoxetine  HCl; Take 1 capsule (30 mg total) by mouth daily.  Dispense: 30 capsule; Refill: 0  Need for pneumococcal 20-valent conjugate vaccination -     Pneumococcal conjugate vaccine 20-valent     Return in about 4 weeks (around 04/08/2024) for depression.    Jolanda Nation, NP

## 2024-03-11 NOTE — Telephone Encounter (Signed)
 LMTCB

## 2024-03-11 NOTE — Patient Instructions (Signed)
 Start Cymbalta  60 mg and 30 mg once daily at the same time.   Schedule tdap at the pharmacy.   Start Zepbound  12.5 mg once weekly.   Follow up in 4 weeks.   It was a pleasure to see you today!

## 2024-03-11 NOTE — Assessment & Plan Note (Signed)
 Uncontrolled.   Discussed treatment options with patient. Denies SI/HI.  Increase Cymbalta  60 mg to 90 mg once daily.   Rx sent for 60 mg once daily.  Rx sent for 30 mg once daily.   Follow up in 4 weeks.

## 2024-03-11 NOTE — Assessment & Plan Note (Signed)
 Discussed at length with patient especially dietary changes and increasing exercise.   Continue Zepbound .  Rx sent for zepbound  12.5 mg once weekly for four weeks.

## 2024-03-12 ENCOUNTER — Other Ambulatory Visit: Payer: Self-pay | Admitting: General Practice

## 2024-03-12 ENCOUNTER — Other Ambulatory Visit (HOSPITAL_COMMUNITY): Payer: Self-pay

## 2024-03-12 ENCOUNTER — Other Ambulatory Visit: Payer: Self-pay

## 2024-03-12 ENCOUNTER — Ambulatory Visit: Admitting: Occupational Therapy

## 2024-03-12 ENCOUNTER — Telehealth: Payer: Self-pay | Admitting: Podiatry

## 2024-03-12 DIAGNOSIS — I890 Lymphedema, not elsewhere classified: Secondary | ICD-10-CM

## 2024-03-12 NOTE — Therapy (Signed)
 OUTPATIENT OCCUPATIONAL THERAPY TREATMENT NOTE  LOWER EXTREMITY LYMPHEDEMA Patient Name: [name] MRN: [id number] DOB:Sep 18, 1956, 68 y.o., female Today's Date: 03/12/2024  END OF SESSION:   OT End of Session - 03/12/24 1601     Visit Number 26    Number of Visits 36    Date for OT Re-Evaluation 03/20/24    OT Start Time 0200    OT Stop Time 0300    OT Time Calculation (min) 60 min    Activity Tolerance Patient tolerated treatment well;No increased pain    Behavior During Therapy WFL for tasks assessed/performed             Past Medical History:  Diagnosis Date   Allergy    Anemia    Anxiety    Arthritis    Chronic venous insufficiency 12/04/2015   Depression    Fibromyalgia    History of chicken pox    HTN (hypertension)    MRSA (methicillin resistant Staphylococcus aureus)    Obese    Plantar fasciitis    Pneumonia    Seasonal allergies    Sleep apnea 2012   took test in [place]-told to use cpap-could not ude   Past Surgical History:  Procedure Laterality Date   ANTERIOR CERVICAL DECOMP/DISCECTOMY FUSION  05/09/2008   C5-6, C6-7; with arthrodesis also   GASTRIC BYPASS  10/24/2000   KNEE ARTHROSCOPY  10/24/2002   rt and lt knee   ROTATOR CUFF REPAIR W/ DISTAL CLAVICLE EXCISION  01/06/2006   right   SHOULDER ARTHROSCOPY Left 10/24/2001   TONSILLECTOMY     TOTAL KNEE ARTHROPLASTY  03/29/2004   left   TOTAL KNEE ARTHROPLASTY  11/07/2003   right   TOTAL KNEE REVISION  07/30/2010   right; with lateral release   UPPER GASTROINTESTINAL ENDOSCOPY     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 I don't know when it started. " + family hx of leg swelling-mother  SUBJECTIVE:  SUBJECTIVE STATEMENT: [name] returns to OT to continue Intensive Phase CDT to LLE. She is accompanied by her spouse, [name]. Pt did not call primary care doctor for a requesting a compression garment referral as instructed. Apparently the provider who referred her to OT for lymphedema care was a one time visit and her primary referring provider was recently replaced. Pt instructed to work with DME provider on obtaining prescription for garments ordered over 6 weeks ago. Pt reports that today she requested that her referring podiatrist send a prescription for compression stockings to OT here at [facility]. Pt instructed again to call the referring provider back and have them respond to the fax sent earlier by the DME vendor. OT does not handle prescriptions for compression garments.   PERTINENT HISTORY: Pls see active problem list above  PAIN:  Are you having LE related pain? No: NPRS scale: 4/10 Pain location: feet Pain description: burning Aggravating factors: walking Relieving factors: elevation  PRECAUTIONS: Other: LYMPHEDEMA PRECAUTIONS  WEIGHT BEARING RESTRICTIONS: No  FALLS:  Has patient fallen in  last 6 months? 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   OBJECTIVE: Note: Objective measures were completed at Evaluation unless otherwise noted.  COGNITION:  Overall cognitive status: frequent redirection needed during eval to stay on topic   POSTURE: WFL  LE ROM: grossly WFL  STRENGTH: grossly WFL   BLE COMPARATIVE LIMB VOLUMETRICS INITIAL 11/01/23  LANDMARK RIGHT  (dominant)  R LEG (A-D) 4296.6 ml  R THIGH (E-G) ml  R FULL LIMB (A-G) ml  Limb Volume differential (LVD)  %  Volume change since initial %  Volume change overall V  (Blank rows = not tested)  LANDMARK LEFT    L LEG (A-D) 4829.1 ml  L  THIGH (E-G) ml  L FULL LIMB (A-G) ml  Limb Volume differential (LVD)   12.4%, L>R  Volume change since initial %  Volume change overall %  (Blank rows = not tested)    9th Visit 12/06/23        LANDMARK LEFT    L LEG (A-D) 4888.8 ml  L  THIGH (E-G) ml  L FULL LIMB (A-G) ml  Limb Volume differential (LVD)     Volume change since initial  + 1.2 %  Volume change overall %  (Blank rows = not tested)    15 th Visit 01/10/24       LANDMARK LEFT    L LEG (A-D) 4637.8 ml  L  THIGH (E-G) ml  L FULL LIMB (A-G) ml  Limb Volume differential (LVD)     Volume change since last measured 12/06/23  DECREASED by 5.14%  Volume change overall %  (Blank rows = not tested)   20 th Visit- BLE COMPARATIVE LIMB VOLUMETRICS  02/01/24   LANDMARK LEFT    L LEG (A-D) 4139.2  ml  L  THIGH (E-G) ml  L FULL LIMB (A-G) ml  Limb Volume differential (LVD)     Volume change since initial DECREASED 10.8 %  Volume  change overall %  (Blank rows = not tested)    Mild, Stage  II, 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 Obesity  Skin  Description Hyper-Keratosis Peau' de Orange Shiny Tight Fibrotic/ Indurated Fatty Doughy Spongy/ boggy   x    Bilateral Chronic Lipo dermatosclerosis Hardened, thickened, leathery      Skin dry Flaky WNL Macerated   mildly x     Color Redness Varicosities Blanching Hemosiderin Stain Mottled   x x x  Severe bilaterally x   Odor Malodorous Yeast Fungal infection  WNL      x   Temperature Warm Cool wnl    x     Pitting Edema   1+ 2+ 3+ 4+ Non-pitting         x   Girth Symmetrical Asymmetrical                   Distribution    L>R toes to popliteal     Stemmer Sign Positive Negative   +    Lymphorrhea History Of:  Present Absent     x    Wounds History Of Present Absent Venous Arterial Pressure Sheer     x        Signs of Infection Redness Warmth Erythema Acute Swelling Drainage Borders                    Sensation Light Touch Deep pressure Hypersensitivity Neuropathic pain   In Tact Impaired In tact Impaired Absent Impaired Plantar surfaces bilaterally   x  x  x      Nails WNL   Fungus nail dystrophy     x   Hair Growth Symmetrical Asymmetrical   x    Skin Creases Base of toes  Ankles   Base of Fingers knees       Abdominal pannus Thigh Lobules  Face/neck   x           TODAY'S TREATMENT:                                                                                                                                         LLE/LLQ MLD w Simultaneous skin L LEG multilayer gradient compression wraps  PATIENT EDUCATION:  Continued Pt/ CG edu for lymphedema self care home program throughout session. Topics include outcome of comparative limb volumetrics- starting limb volume  differentials (LVDs), technology and gradient techniques used for short stretch, multilayer compression wrapping, simple self-MLD, therapeutic lymphatic pumping exercises, skin/nail care, LE precautions,. compression garment recommendations and specifications, wear and care schedule and compression garment donning / doffing w assistive devices. Discussed progress towards all OT goals since commencing CDT. All questions answered to the Pt's satisfaction. Good return. Person educated: Patient  Education method: Explanation, Demonstration, and Handouts Education comprehension: verbalized understanding, returned demonstration, verbal cues required, and needs further education  HOME EXERCISE PROGRAM:  BLE lymphatic pumping there ex using- 1 sets of 10 reps, each exercise in order-  1-2 x daily, bilaterally Simple self MLD 1 x daily Daily skin care to increase hydration, skin mobility and decrease infection risk- can be done during MLD Compression wraps 23/7 until garment fitting complete   ASSESSMENT  CLINICAL IMPRESSION: Open areas on anterior L leg  observed last session are scabbed over and healing well.  Continued MLD w simultaneous skin care to LLE , then knee length compression wrapping as established. Fit custom LLE compression garment ( ordered in early April) before moving on to Rx of RLE. Cont as per POC.    (Initial Eval 09/13/23: Arynn Armand is a 68 y.o. 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 Pt denies hx of known, chronic limb swelling in her family. She does not utilize compression garments, or a vasopneumatic compression "pump" as conservative measures. Chronic limb swelling and associated pain limits functional performance in all  occupational domains, including functional ambulation and mobility, self-care and basic and instrumental ADLs. It limits participation in leisure pursuits, productive activities and social participle[ation.   Pt will benefit from  skilled Occupational Therapy for both  Intensive and Self -management phases of Complete Decongestive Therapy (CDT) to reduce limb swelling and associated pain, to limit progression, to reduce infection risk , and to increase functional performance, social participation and quality of life. CDT include manual lymphatic drainage (MLD), skin care, therapeutic exercise and compression therapies- multilayer bandaging initially, one limb at a time,  and then appropriate compression garments, or alternatives in the final phase. Pt and caregivers who assist are trained to perform a;; aspects of Lymphedema self-care top ensure optimal self management over time. Without skilled  OT for CDT, further progression of lymphedema is certain and further functional decline is expected.)   OBJECTIVE IMPAIRMENTS: decreased activity tolerance, decreased knowledge of condition, decreased knowledge of use of DME, increased edema, pain, and chronic leg swelling.   ACTIVITY LIMITATIONS: limited functional ambulation and functional mobility 2/2 lymphedema -related foot and leg pain; standing, squatting, stairs, bathing, toileting, dressing, and hygiene/grooming  PARTICIPATION LIMITATIONS: meal prep, cooking, cleaning, driving, shopping, community activity, and volunteering  PERSONAL FACTORS: Time since onset of injury/illness/exacerbation are also affecting patient's functional outcome.   REHAB POTENTIAL: Good  CLINICAL DECISION MAKING: Evolving/moderate complexity  EVALUATION COMPLEXITY: Moderate   GOALS: Goals reviewed with patient? Yes  SHORT TERM GOALS: Target date: 4th OT Rx visit   Pt will demonstrate understanding of lymphedema precautions and prevention strategies with modified  independence using a printed reference to identify at least 5 precautions and discussing how s/he may implement them into daily life to reduce risk of progression with extra time. Baseline:Max A Goal status: 11/20/23 GOAL MET  2.  Pt will be able to apply multilayer, knee length, gradient, compression wraps to one leg at a time with max caregiver assistance to decrease limb volume, to limit infection risk, and to limit lymphedema progression.  Baseline: Dependent Goal status: 11/13/23 GOAL MET  LONG TERM GOALS: Target date: 12/12/22  Given this patient's Intake score of TBD % on the functional outcomes FOTO tool, patient will experience an increase in function of 5 points to improve basic and instrumental ADLs performance, including lymphedema self-care.  Baseline: Max A Goal status:  GOAL DEFERRED. FOTO tool discontinued.  2.  Given this patient's Intake score of 64.71 on the Lymphedema Life Impact Scale (LLIS), patient will experience a reduction of at least 5 points in her perceived level of functional impairment resulting from lymphedema to improve functional performance and quality of life (QOL). Baseline: 64.71% Goal status: PROGRESSING  3.  Pt will achieve at least a 10% volume reduction in B legs to return limb to typical size and shape, to limit infection risk and LE progression, to decrease pain, to improve function. Baseline: Dependent Goal status: MET FOR L LEG with 10.8% REDUCTION.   4.  Pt will obtain proper compression garments/devices and achieve modified independence (extra time + assistive devices) with donning/doffing to optimize limb volume reductions and limit LE progression over time. Baseline:  Goal status: PROGRESSING. LLE custom garment ordered. PLAN:  OT FREQUENCY: 2x/week  OT DURATION: 12 weeks  PLANNED INTERVENTIONS: 97110-Therapeutic exercises, 97530- Therapeutic activity, 97535- Self Care, 91478- Manual therapy, Patient/Family education, Manual lymph drainage,  Compression bandaging, DME instructions, and fitting with compression garments  Custom-made gradient compression garments and HOS devices are medically necessary because they are uniquely sized and shaped to fit the exact dimensions of the affected extremities, and to provide appropriate medical grade, graduated compression essential for optimally managing chronic, progressive lymphedema. Multiple custom compression garments are needed to ensure proper hygiene to limit infection risk. Custom compression garments should be replaced q 3-6 months When worn consistently for optimal lipo-lymphedema self-management over time. HOS devices, medically necessary to limit fibrosis buildup in tissue, should be replaced q 2 years and PRN when worn out.     Pt should be fit:  3 Left and 3 Right, custom, Jobst, flat knit, Elvarex Classic, ccl 2 or 3, knee length compression stockings with open toes to limit and control chronic leg swelling, to improve lymphatic circulation, to reduce infection risk, to limit formation of tissue fibrosis and to limit progression of lymphedema over time. Custom compression garments should be replaced q 3-6 months when worn consistently for optimal lymphedema self-management.   Pt should be fit with 1 R and 1 L custom, Jobst Relax convoluted Hours-of-Sleep (HOS)  compression device to counteract fluid accumulation and fibrosis formation leading to lymphedema progression. at night. HOS devices, medically necessary to limit fibrosis buildup in tissue, should be replaced q 2 years and PRN when worn out.    PLAN FOR NEXT SESSION:  LLE MLD as established using functional inguinal LN and deep abdominal lymphatics. Multilayer compression bandaging to L leg using 1 each 8, 10 and 12 cm wide x 5 meters long short stretch wrap over single layer of rosidal foam and cotton stockinett using gradient techniques. Pt instructed to remove wraps and report symptoms to her doctor if she experiences acute  pain in the limb, signs/symptoms of infection, or atypical sob.  Pt / family edu for LE self care: simple self MLD leg sequence  Arnold Bicker, MS, OTR/L, CLT-LANA 03/12/24 4:02 PM

## 2024-03-12 NOTE — Telephone Encounter (Signed)
 [name], Theresa Gilliam,(Therapist) is requesting prescription for support hose for patient. Patient contact telephone number, [phone]

## 2024-03-12 NOTE — Telephone Encounter (Signed)
 Patient aware not covered and is paying out of pocket

## 2024-03-13 ENCOUNTER — Other Ambulatory Visit (HOSPITAL_COMMUNITY): Payer: Self-pay

## 2024-03-13 ENCOUNTER — Other Ambulatory Visit: Payer: Self-pay | Admitting: Podiatry

## 2024-03-13 ENCOUNTER — Ambulatory Visit: Payer: Self-pay | Admitting: Occupational Therapy

## 2024-03-13 DIAGNOSIS — I890 Lymphedema, not elsewhere classified: Secondary | ICD-10-CM

## 2024-03-13 DIAGNOSIS — I872 Venous insufficiency (chronic) (peripheral): Secondary | ICD-10-CM

## 2024-03-13 NOTE — Progress Notes (Signed)
 Outpatient rehab requested order for compressive stockings.  Order placed in Epic today.

## 2024-03-14 ENCOUNTER — Ambulatory Visit: Admitting: Occupational Therapy

## 2024-03-14 DIAGNOSIS — I890 Lymphedema, not elsewhere classified: Secondary | ICD-10-CM

## 2024-03-14 NOTE — Therapy (Signed)
 OUTPATIENT OCCUPATIONAL THERAPY TREATMENT NOTE  LOWER EXTREMITY LYMPHEDEMA Patient Name: FARIDA MCREYNOLDS MRN: [id number] DOB:03-07-1956, 68 y.o., female Today's Date: 03/14/2024  END OF SESSION:   OT End of Session - 03/14/24 1508     Visit Number 27    Number of Visits 36    Date for OT Re-Evaluation 03/20/24    OT Start Time 0308    OT Stop Time 0400    OT Time Calculation (min) 52 min    Activity Tolerance Patient tolerated treatment well;No increased pain    Behavior During Therapy WFL for tasks assessed/performed             Past Medical History:  Diagnosis Date   Allergy    Anemia    Anxiety    Arthritis    Chronic venous insufficiency 12/04/2015   Depression    Fibromyalgia    History of chicken pox    HTN (hypertension)    MRSA (methicillin resistant Staphylococcus aureus)    Obese    Plantar fasciitis    Pneumonia    Seasonal allergies    Sleep apnea 2012   took test in [place]-told to use cpap-could not ude   Past Surgical History:  Procedure Laterality Date   ANTERIOR CERVICAL DECOMP/DISCECTOMY FUSION  05/09/2008   C5-6, C6-7; with arthrodesis also   GASTRIC BYPASS  10/24/2000   KNEE ARTHROSCOPY  10/24/2002   rt and lt knee   ROTATOR CUFF REPAIR W/ DISTAL CLAVICLE EXCISION  01/06/2006   right   SHOULDER ARTHROSCOPY Left 10/24/2001   TONSILLECTOMY     TOTAL KNEE ARTHROPLASTY  03/29/2004   left   TOTAL KNEE ARTHROPLASTY  11/07/2003   right   TOTAL KNEE REVISION  07/30/2010   right; with lateral release   UPPER GASTROINTESTINAL ENDOSCOPY     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 I don't know when it started. " + family hx of leg swelling-mother  SUBJECTIVE:  SUBJECTIVE STATEMENT: JAMIRRA CURNOW returns to OT to continue Intensive Phase CDT to LLE. Pt presents with compression wraps in place. Pt denies LE related pain.  PERTINENT HISTORY: Pls see active problem list above  PAIN:  Are you having LE related pain? No: NPRS scale: 4/10 Pain location: feet Pain description: burning Aggravating factors: walking Relieving factors: elevation  PRECAUTIONS: Other: LYMPHEDEMA PRECAUTIONS  WEIGHT BEARING RESTRICTIONS: No  FALLS:  Has patient fallen in last 6 months? 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   OBJECTIVE: Note: Objective measures were  completed at Evaluation unless otherwise noted.  COGNITION:  Overall cognitive status: frequent redirection needed during eval to stay on topic   POSTURE: WFL  LE ROM: grossly WFL  STRENGTH: grossly WFL   BLE COMPARATIVE LIMB VOLUMETRICS INITIAL 11/01/23  LANDMARK RIGHT  (dominant)  R LEG (A-D) 4296.6 ml  R THIGH (E-G) ml  R FULL LIMB (A-G) ml  Limb Volume differential (LVD)  %  Volume change since initial %  Volume change overall V  (Blank rows = not tested)  LANDMARK LEFT    L LEG (A-D) 4829.1 ml  L  THIGH (E-G) ml  L FULL LIMB (A-G) ml  Limb Volume differential (LVD)   12.4%, L>R  Volume change since initial %  Volume change overall %  (Blank rows = not tested)    9th Visit 12/06/23        LANDMARK LEFT    L LEG (A-D) 4888.8 ml  L  THIGH (E-G) ml  L FULL LIMB (A-G) ml  Limb Volume differential (LVD)     Volume change since initial  + 1.2 %  Volume change overall %  (Blank rows = not tested)    15 th Visit 01/10/24       LANDMARK LEFT    L LEG (A-D) 4637.8 ml  L  THIGH (E-G) ml  L FULL LIMB (A-G) ml  Limb Volume differential (LVD)     Volume change since last measured 12/06/23  DECREASED by 5.14%  Volume change overall %  (Blank rows = not tested)   20 th Visit- BLE COMPARATIVE LIMB VOLUMETRICS  02/01/24   LANDMARK LEFT    L LEG (A-D) 4139.2 ml  L  THIGH (E-G) ml  L FULL LIMB (A-G) ml  Limb Volume differential (LVD)     Volume change since initial DECREASED 10.8 %  Volume change overall %  (Blank rows = not tested)    Mild, Stage  II, 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 Obesity  Skin  Description Hyper-Keratosis Peau' de Orange Shiny Tight Fibrotic/ Indurated Fatty Doughy Spongy/ boggy   x    Bilateral Chronic Lipo dermatosclerosis Hardened, thickened, leathery      Skin dry Flaky WNL Macerated   mildly x     Color Redness Varicosities Blanching Hemosiderin Stain Mottled   x x x  Severe bilaterally x   Odor Malodorous Yeast  Fungal infection  WNL      x  Temperature Warm Cool wnl    x     Pitting Edema   1+ 2+ 3+ 4+ Non-pitting         x   Girth Symmetrical Asymmetrical                   Distribution    L>R toes to popliteal     Stemmer Sign Positive Negative   +    Lymphorrhea History Of:  Present Absent     x    Wounds History Of Present Absent Venous Arterial Pressure Sheer     x        Signs of Infection Redness Warmth Erythema Acute Swelling Drainage Borders                    Sensation Light Touch Deep pressure Hypersensitivity Neuropathic pain   In Tact Impaired In tact Impaired Absent Impaired Plantar surfaces bilaterally   x  x  x      Nails WNL   Fungus nail dystrophy     x   Hair Growth Symmetrical Asymmetrical   x    Skin Creases Base of toes  Ankles   Base of Fingers knees       Abdominal pannus Thigh Lobules  Face/neck   x           TODAY'S TREATMENT:                                                                                                                                         LLE/LLQ MLD w Simultaneous skin L LEG multilayer gradient compression wraps  PATIENT EDUCATION:  Continued Pt/ CG edu for lymphedema self care home program throughout session. Topics include outcome of comparative limb volumetrics- starting limb volume differentials (LVDs), technology and gradient techniques used for short stretch, multilayer compression wrapping, simple self-MLD, therapeutic lymphatic pumping exercises, skin/nail care, LE precautions,. compression garment recommendations and specifications, wear and care schedule and compression garment donning / doffing w assistive devices. Discussed progress towards all OT goals since commencing CDT. All questions answered to the Pt's satisfaction. Good return. Person educated: Patient  Education method: Explanation, Demonstration, and Handouts Education comprehension: verbalized understanding, returned demonstration, verbal  cues required, and needs further education  HOME EXERCISE PROGRAM: BLE lymphatic pumping there ex using- 1 sets of 10 reps, each exercise in order-  1-2 x daily, bilaterally Simple self MLD 1 x daily Daily skin care to increase hydration, skin mobility and decrease infection risk- can be done during MLD Compression wraps 23/7 until garment fitting complete   ASSESSMENT  CLINICAL IMPRESSION:  Continued MLD w simultaneous skin care to LLE , then knee length compression wrapping as established. Fit custom LLE compression garment ( ordered in early April) before moving on to Rx of RLE. Cont as per POC.    (Initial Eval 09/13/23: Leasia Swann is a  68 y.o. 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 Pt denies hx of known, chronic limb swelling in her family. She does not utilize compression garments, or a vasopneumatic compression "pump" as conservative measures. Chronic limb swelling and associated pain limits functional performance in all occupational domains, including functional ambulation and mobility, self-care and basic and instrumental ADLs. It limits participation in leisure pursuits, productive activities and social participle[ation.   Pt will benefit from  skilled Occupational Therapy for both  Intensive and Self -management phases of Complete Decongestive Therapy (CDT) to reduce limb swelling and associated pain, to limit progression, to reduce infection risk , and to increase functional performance, social participation and quality of life. CDT include manual lymphatic drainage (MLD), skin care, therapeutic exercise and compression therapies- multilayer bandaging initially, one limb at a time,  and then appropriate compression garments, or  alternatives in the final phase. Pt and caregivers who assist are trained to perform a;; aspects of Lymphedema self-care top ensure optimal self management over time. Without skilled  OT for CDT, further progression of lymphedema is certain and further functional decline is expected.)   OBJECTIVE IMPAIRMENTS: decreased activity tolerance, decreased knowledge of condition, decreased knowledge of use of DME, increased edema, pain, and chronic leg swelling.   ACTIVITY LIMITATIONS: limited functional ambulation and functional mobility 2/2 lymphedema -related foot and leg pain; standing, squatting, stairs, bathing, toileting, dressing, and hygiene/grooming  PARTICIPATION LIMITATIONS: meal prep, cooking, cleaning, driving, shopping, community activity, and volunteering  PERSONAL FACTORS: Time since onset of injury/illness/exacerbation are also affecting patient's functional outcome.   REHAB POTENTIAL: Good  CLINICAL DECISION MAKING: Evolving/moderate complexity  EVALUATION COMPLEXITY: Moderate   GOALS: Goals reviewed with patient? Yes  SHORT TERM GOALS: Target date: 4th OT Rx visit   Pt will demonstrate understanding of lymphedema precautions and prevention strategies with modified independence using a printed reference to identify at least 5 precautions and discussing how s/he may implement them into daily life to reduce risk of progression with extra time. Baseline:Max A Goal status: 11/20/23 GOAL MET  2.  Pt will be able to apply multilayer, knee length, gradient, compression wraps to one leg at a time with max caregiver assistance to decrease limb volume, to limit infection risk, and to limit lymphedema progression.  Baseline: Dependent Goal status: 11/13/23 GOAL MET  LONG TERM GOALS: Target date: 12/12/22  Given this patient's Intake score of TBD % on the functional outcomes FOTO tool, patient will experience an increase in function of 5 points to improve basic and instrumental ADLs  performance, including lymphedema self-care.  Baseline: Max A Goal status: GOAL DEFERRED. FOTO tool discontinued.  2.  Given this patient's Intake score of 64.71 on the Lymphedema Life Impact Scale (LLIS), patient will experience a reduction of at least 5 points in her perceived level of functional impairment resulting from lymphedema to improve functional performance and quality of life (QOL). Baseline: 64.71% Goal status: PROGRESSING  3.  Pt will achieve at least a 10% volume reduction in B legs to return limb to typical size and shape, to limit infection risk and LE progression, to decrease pain, to improve function. Baseline: Dependent Goal status: MET FOR L LEG with 10.8% REDUCTION.   4.  Pt will obtain proper compression garments/devices and achieve modified independence (extra time +  assistive devices) with donning/doffing to optimize limb volume reductions and limit LE progression over time. Baseline:  Goal status: PROGRESSING. LLE custom garment ordered. PLAN:  OT FREQUENCY: 2x/week  OT DURATION: 12 weeks  PLANNED INTERVENTIONS: 97110-Therapeutic exercises, 97530- Therapeutic activity, 97535- Self Care, 32440- Manual therapy, Patient/Family education, Manual lymph drainage, Compression bandaging, DME instructions, and fitting with compression garments  Custom-made gradient compression garments and HOS devices are medically necessary because they are uniquely sized and shaped to fit the exact dimensions of the affected extremities, and to provide appropriate medical grade, graduated compression essential for optimally managing chronic, progressive lymphedema. Multiple custom compression garments are needed to ensure proper hygiene to limit infection risk. Custom compression garments should be replaced q 3-6 months When worn consistently for optimal lipo-lymphedema self-management over time. HOS devices, medically necessary to limit fibrosis buildup in tissue, should be replaced q 2  years and PRN when worn out.     Pt should be fit:  3 Left and 3 Right, custom, Jobst, flat knit, Elvarex Classic, ccl 2 or 3, knee length compression stockings with open toes to limit and control chronic leg swelling, to improve lymphatic circulation, to reduce infection risk, to limit formation of tissue fibrosis and to limit progression of lymphedema over time. Custom compression garments should be replaced q 3-6 months when worn consistently for optimal lymphedema self-management.   Pt should be fit with 1 R and 1 L custom, Jobst Relax convoluted Hours-of-Sleep (HOS)  compression device to counteract fluid accumulation and fibrosis formation leading to lymphedema progression. at night. HOS devices, medically necessary to limit fibrosis buildup in tissue, should be replaced q 2 years and PRN when worn out.    PLAN FOR NEXT SESSION:  LLE MLD as established using functional inguinal LN and deep abdominal lymphatics. Multilayer compression bandaging to L leg using 1 each 8, 10 and 12 cm wide x 5 meters long short stretch wrap over single layer of rosidal foam and cotton stockinett using gradient techniques. Pt instructed to remove wraps and report symptoms to her doctor if she experiences acute pain in the limb, signs/symptoms of infection, or atypical sob.  Pt / family edu for LE self care: simple self MLD leg sequence  Arnold Bicker, MS, OTR/L, CLT-LANA 03/14/24 4:08 PM

## 2024-03-19 ENCOUNTER — Ambulatory Visit: Admitting: Occupational Therapy

## 2024-03-19 DIAGNOSIS — I890 Lymphedema, not elsewhere classified: Secondary | ICD-10-CM

## 2024-03-19 NOTE — Therapy (Signed)
 OUTPATIENT OCCUPATIONAL THERAPY TREATMENT NOTE  LOWER EXTREMITY LYMPHEDEMA Patient Name: [name] MRN: [id number] DOB:[birth year], 68 y.o., female Today's Date: 03/19/2024  END OF SESSION:   OT End of Session - 03/19/24 1506     Visit Number 28    Date for OT Re-Evaluation 03/20/24    OT Start Time 0208    OT Stop Time 0308    OT Time Calculation (min) 60 min    Activity Tolerance Patient tolerated treatment well;No increased pain             Past Medical History:  Diagnosis Date   Allergy    Anemia    Anxiety    Arthritis    Chronic venous insufficiency 12/04/2015   Depression    Fibromyalgia    History of chicken pox    HTN (hypertension)    MRSA (methicillin resistant Staphylococcus aureus)    Obese    Plantar fasciitis    Pneumonia    Seasonal allergies    Sleep apnea 2012   took test in [place]-told to use cpap-could not ude   Past Surgical History:  Procedure Laterality Date   ANTERIOR CERVICAL DECOMP/DISCECTOMY FUSION  05/09/2008   C5-6, C6-7; with arthrodesis also   GASTRIC BYPASS  10/24/2000   KNEE ARTHROSCOPY  10/24/2002   rt and lt knee   ROTATOR CUFF REPAIR W/ DISTAL CLAVICLE EXCISION  01/06/2006   right   SHOULDER ARTHROSCOPY Left 10/24/2001   TONSILLECTOMY     TOTAL KNEE ARTHROPLASTY  03/29/2004   left   TOTAL KNEE ARTHROPLASTY  11/07/2003   right   TOTAL KNEE REVISION  07/30/2010   right; with lateral release   UPPER GASTROINTESTINAL ENDOSCOPY     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 I don't know when it started. " + family hx of leg swelling-mother  SUBJECTIVE:  SUBJECTIVE STATEMENT: [name] returns to OT to continue Intensive Phase CDT to LLE. Pt presents with compression wraps in place. Pt denies LE related pain. Pt's spouse attends 2nd part of session. Pt has not heard from DME vendor about her compression stockings.  PERTINENT HISTORY: Pls see active problem list above  PAIN:  Are you having LE related pain? No: NPRS scale: 0/10 Pain location: feet Pain description: burning Aggravating factors: walking Relieving factors: elevation  PRECAUTIONS: Other: LYMPHEDEMA PRECAUTIONS  WEIGHT BEARING RESTRICTIONS: No  FALLS:  Has patient fallen in last 6 months? 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   OBJECTIVE: Note:  Objective measures were completed at Evaluation unless otherwise noted.  COGNITION:  Overall cognitive status: frequent redirection needed during eval to stay on topic   POSTURE: WFL  LE ROM: grossly WFL  STRENGTH: grossly WFL   BLE COMPARATIVE LIMB VOLUMETRICS INITIAL 11/01/23  LANDMARK RIGHT  (dominant)  R LEG (A-D) 4296.6 ml  R THIGH (E-G) ml  R FULL LIMB (A-G) ml  Limb Volume differential (LVD)  %  Volume change since initial %  Volume change overall V  (Blank rows = not tested)  LANDMARK LEFT    L LEG (A-D) 4829.1 ml  L  THIGH (E-G) ml  L FULL LIMB (A-G) ml  Limb Volume differential (LVD)   12.4%, L>R  Volume change since initial %  Volume change overall %  (Blank rows = not tested)    9th Visit 12/06/23        LANDMARK LEFT    L LEG (A-D) 4888.8 ml  L  THIGH (E-G) ml  L FULL LIMB (A-G) ml  Limb Volume differential (LVD)     Volume change since initial  + 1.2 %  Volume change overall %  (Blank rows = not tested)    15 th Visit 01/10/24       LANDMARK LEFT    L LEG (A-D) 4637.8 ml  L  THIGH (E-G) ml  L FULL LIMB (A-G) ml  Limb Volume differential (LVD)     Volume change since last measured 12/06/23  DECREASED by 5.14%  Volume change overall %  (Blank rows = not tested)   20 th Visit- BLE COMPARATIVE LIMB VOLUMETRICS  02/01/24   LANDMARK LEFT    L LEG (A-D) 4139.2 ml  L  THIGH (E-G) ml  L FULL LIMB (A-G) ml  Limb Volume differential (LVD)     Volume change since initial DECREASED 10.8 %  Volume change overall %  (Blank rows = not tested)    Mild, Stage  II, 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 Obesity  Skin  Description Hyper-Keratosis Peau' de Orange Shiny Tight Fibrotic/ Indurated Fatty Doughy Spongy/ boggy   x    Bilateral Chronic Lipo dermatosclerosis Hardened, thickened, leathery      Skin dry Flaky WNL Macerated   mildly x     Color Redness Varicosities Blanching Hemosiderin Stain Mottled   x x x  Severe bilaterally x  Odor Malodorous Yeast Fungal infection  WNL      x   Temperature Warm Cool wnl    x     Pitting Edema   1+ 2+ 3+ 4+ Non-pitting         x   Girth Symmetrical Asymmetrical                   Distribution    L>R toes to popliteal     Stemmer Sign Positive Negative   +    Lymphorrhea History Of:  Present Absent     x    Wounds History Of Present Absent Venous Arterial Pressure Sheer     x        Signs of Infection Redness Warmth Erythema Acute Swelling Drainage Borders                    Sensation Light Touch Deep pressure Hypersensitivity Neuropathic pain   In Tact Impaired In tact Impaired Absent Impaired Plantar surfaces bilaterally   x  x  x      Nails WNL   Fungus nail dystrophy     x   Hair Growth Symmetrical Asymmetrical   x    Skin Creases Base of toes  Ankles   Base of Fingers knees       Abdominal pannus Thigh Lobules  Face/neck   x           TODAY'S TREATMENT:                                                                                                                                         LLE/LLQ MLD w Simultaneous skin L LEG multilayer gradient compression wraps  PATIENT EDUCATION:  Continued Pt/ CG edu for lymphedema self care home program throughout session. Topics include outcome of comparative limb volumetrics- starting limb volume differentials (LVDs), technology and gradient techniques used for short stretch, multilayer compression wrapping, simple self-MLD, therapeutic lymphatic pumping exercises, skin/nail care, LE precautions,. compression garment recommendations and specifications, wear and care schedule and compression garment donning / doffing w assistive devices. Discussed progress towards all OT goals since commencing CDT. All questions answered to the Pt's satisfaction. Good return. Person educated: Patient  Education method: Explanation, Demonstration, and Handouts Education comprehension: verbalized understanding,  returned demonstration, verbal cues required, and needs further education  HOME EXERCISE PROGRAM: BLE lymphatic pumping there ex using- 1 sets of 10 reps, each exercise in order-  1-2 x daily, bilaterally Simple self MLD 1 x daily Daily skin care to increase hydration, skin mobility and decrease infection risk- can be done during MLD Compression wraps 23/7 until garment fitting complete   ASSESSMENT  CLINICAL IMPRESSION:  Continued MLD w simultaneous skin care to LLE , then knee length compression wrapping as established. Fit custom LLE compression garment ( ordered in early April) before moving on to Rx of  RLE. Cont as per POC.    (Initial Eval 09/13/23: Bergen Melle is a 68 y.o. 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 Pt denies hx of known, chronic limb swelling in her family. She does not utilize compression garments, or a vasopneumatic compression "pump" as conservative measures. Chronic limb swelling and associated pain limits functional performance in all occupational domains, including functional ambulation and mobility, self-care and basic and instrumental ADLs. It limits participation in leisure pursuits, productive activities and social participle[ation.   Pt will benefit from  skilled Occupational Therapy for both  Intensive and Self -management phases of Complete Decongestive Therapy (CDT) to reduce limb swelling and associated pain, to limit progression, to reduce infection risk , and to increase functional performance, social participation and quality of life. CDT include manual lymphatic drainage (MLD), skin care, therapeutic exercise and compression therapies- multilayer bandaging initially, one limb at a time,  and then appropriate  compression garments, or alternatives in the final phase. Pt and caregivers who assist are trained to perform a;; aspects of Lymphedema self-care top ensure optimal self management over time. Without skilled  OT for CDT, further progression of lymphedema is certain and further functional decline is expected.)   OBJECTIVE IMPAIRMENTS: decreased activity tolerance, decreased knowledge of condition, decreased knowledge of use of DME, increased edema, pain, and chronic leg swelling.   ACTIVITY LIMITATIONS: limited functional ambulation and functional mobility 2/2 lymphedema -related foot and leg pain; standing, squatting, stairs, bathing, toileting, dressing, and hygiene/grooming  PARTICIPATION LIMITATIONS: meal prep, cooking, cleaning, driving, shopping, community activity, and volunteering  PERSONAL FACTORS: Time since onset of injury/illness/exacerbation are also affecting patient's functional outcome.   REHAB POTENTIAL: Good  CLINICAL DECISION MAKING: Evolving/moderate complexity  EVALUATION COMPLEXITY: Moderate   GOALS: Goals reviewed with patient? Yes  SHORT TERM GOALS: Target date: 4th OT Rx visit   Pt will demonstrate understanding of lymphedema precautions and prevention strategies with modified independence using a printed reference to identify at least 5 precautions and discussing how s/he may implement them into daily life to reduce risk of progression with extra time. Baseline:Max A Goal status: 11/20/23 GOAL MET  2.  Pt will be able to apply multilayer, knee length, gradient, compression wraps to one leg at a time with max caregiver assistance to decrease limb volume, to limit infection risk, and to limit lymphedema progression.  Baseline: Dependent Goal status: 11/13/23 GOAL MET  LONG TERM GOALS: Target date: 12/12/22  Given this patient's Intake score of TBD % on the functional outcomes FOTO tool, patient will experience an increase in function of 5 points to improve basic  and instrumental ADLs performance, including lymphedema self-care.  Baseline: Max A Goal status: GOAL DEFERRED. FOTO tool discontinued.  2.  Given this patient's Intake score of 64.71 on the Lymphedema Life Impact Scale (LLIS), patient will experience a reduction of at least 5 points in her perceived level of functional impairment resulting from lymphedema to improve functional performance and quality of life (QOL). Baseline: 64.71% Goal status: PROGRESSING  3.  Pt will achieve at least a 10% volume reduction in B legs to return limb to typical size and shape, to limit infection risk and LE progression, to decrease pain, to improve function. Baseline: Dependent Goal status: MET FOR L LEG with 10.8% REDUCTION.  4.  Pt will obtain proper compression garments/devices and achieve modified independence (extra time + assistive devices) with donning/doffing to optimize limb volume reductions and limit LE progression over time. Baseline:  Goal status: PROGRESSING. LLE custom garment ordered. PLAN:  OT FREQUENCY: 2x/week  OT DURATION: 12 weeks  PLANNED INTERVENTIONS: 97110-Therapeutic exercises, 97530- Therapeutic activity, 97535- Self Care, [id number]- Manual therapy, Patient/Family education, Manual lymph drainage, Compression bandaging, DME instructions, and fitting with compression garments  Custom-made gradient compression garments and HOS devices are medically necessary because they are uniquely sized and shaped to fit the exact dimensions of the affected extremities, and to provide appropriate medical grade, graduated compression essential for optimally managing chronic, progressive lymphedema. Multiple custom compression garments are needed to ensure proper hygiene to limit infection risk. Custom compression garments should be replaced q 3-6 months When worn consistently for optimal lipo-lymphedema self-management over time. HOS devices, medically necessary to limit fibrosis buildup in tissue,  should be replaced q 2 years and PRN when worn out.     Pt should be fit:  3 Left and 3 Right, custom, Jobst, flat knit, Elvarex Classic, ccl 2 or 3, knee length compression stockings with open toes to limit and control chronic leg swelling, to improve lymphatic circulation, to reduce infection risk, to limit formation of tissue fibrosis and to limit progression of lymphedema over time. Custom compression garments should be replaced q 3-6 months when worn consistently for optimal lymphedema self-management.   Pt should be fit with 1 R and 1 L custom, Jobst Relax convoluted Hours-of-Sleep (HOS)  compression device to counteract fluid accumulation and fibrosis formation leading to lymphedema progression. at night. HOS devices, medically necessary to limit fibrosis buildup in tissue, should be replaced q 2 years and PRN when worn out.    PLAN FOR NEXT SESSION:  LLE MLD as established using functional inguinal LN and deep abdominal lymphatics. Multilayer compression bandaging to L leg using 1 each 8, 10 and 12 cm wide x 5 meters long short stretch wrap over single layer of rosidal foam and cotton stockinett using gradient techniques. Pt instructed to remove wraps and report symptoms to her doctor if she experiences acute pain in the limb, signs/symptoms of infection, or atypical sob.  Pt / family edu for LE self care: simple self MLD leg sequence  Arnold Bicker, MS, OTR/L, CLT-LANA 03/19/24 3:13 PM

## 2024-03-21 ENCOUNTER — Ambulatory Visit: Admitting: Occupational Therapy

## 2024-03-21 DIAGNOSIS — I890 Lymphedema, not elsewhere classified: Secondary | ICD-10-CM

## 2024-03-21 NOTE — Therapy (Signed)
 OUTPATIENT OCCUPATIONAL THERAPY TREATMENT NOTE  LOWER EXTREMITY LYMPHEDEMA Patient Name: [name] MRN: [id number] DOB:[birth year], 68 y.o., female Today's Date: 03/21/2024  END OF SESSION:   OT End of Session - 03/21/24 1523     Visit Number 29    Number of Visits 36    Date for OT Re-Evaluation 03/20/24    OT Start Time 0215    OT Stop Time 0308    OT Time Calculation (min) 53 min    Activity Tolerance Patient tolerated treatment well;No increased pain    Behavior During Therapy WFL for tasks assessed/performed             Past Medical History:  Diagnosis Date   Allergy    Anemia    Anxiety    Arthritis    Chronic venous insufficiency 12/04/2015   Depression    Fibromyalgia    History of chicken pox    HTN (hypertension)    MRSA (methicillin resistant Staphylococcus aureus)    Obese    Plantar fasciitis    Pneumonia    Seasonal allergies    Sleep apnea 2012   took test in [place]-told to use cpap-could not ude   Past Surgical History:  Procedure Laterality Date   ANTERIOR CERVICAL DECOMP/DISCECTOMY FUSION  05/09/2008   C5-6, C6-7; with arthrodesis also   GASTRIC BYPASS  10/24/2000   KNEE ARTHROSCOPY  10/24/2002   rt and lt knee   ROTATOR CUFF REPAIR W/ DISTAL CLAVICLE EXCISION  01/06/2006   right   SHOULDER ARTHROSCOPY Left 10/24/2001   TONSILLECTOMY     TOTAL KNEE ARTHROPLASTY  03/29/2004   left   TOTAL KNEE ARTHROPLASTY  11/07/2003   right   TOTAL KNEE REVISION  07/30/2010   right; with lateral release   UPPER GASTROINTESTINAL ENDOSCOPY     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 I don't know when it started. " + family hx of leg swelling-mother  SUBJECTIVE:  SUBJECTIVE STATEMENT: [name] returns to OT to continue Intensive Phase CDT to LLE. Pt presents with compression wraps in place. Pt denies LE related pain. Pt's spouse attends 2nd part of session. Pt has not heard from DME vendor about her compression stockings.  PERTINENT HISTORY: Pls see active problem list above  PAIN:  Are you having LE related pain? No: NPRS scale: 0/10 Pain location: feet Pain description: burning Aggravating factors: walking Relieving factors: elevation  PRECAUTIONS: Other: LYMPHEDEMA PRECAUTIONS  WEIGHT BEARING RESTRICTIONS: No  FALLS:  Has patient fallen in last 6 months? 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   OBJECTIVE: Note: Objective measures were completed at Evaluation unless otherwise noted.  COGNITION:  Overall cognitive status: frequent redirection needed during eval to stay on topic   POSTURE: WFL  LE ROM: grossly WFL  STRENGTH: grossly WFL   BLE COMPARATIVE LIMB VOLUMETRICS INITIAL 11/01/23  LANDMARK RIGHT  (dominant)  R LEG (A-D) 4296.6 ml  R THIGH (E-G) ml  R FULL LIMB (A-G) ml  Limb Volume differential (LVD)  %  Volume change since initial %  Volume change overall V  (Blank rows = not tested)  LANDMARK LEFT    L LEG (A-D) 4829.1 ml  L  THIGH (E-G) ml  L FULL LIMB (A-G) ml  Limb Volume differential (LVD)   12.4%, L>R  Volume change since initial %  Volume change overall %  (Blank rows = not tested)    9th Visit 12/06/23        LANDMARK LEFT    L LEG (A-D) 4888.8 ml  L  THIGH (E-G) ml  L FULL LIMB (A-G) ml  Limb Volume differential (LVD)     Volume change since initial  + 1.2 %  Volume change overall %  (Blank rows = not tested)    15 th Visit 01/10/24       LANDMARK LEFT    L LEG (A-D) 4637.8 ml  L  THIGH (E-G) ml  L FULL LIMB (A-G) ml  Limb Volume differential (LVD)     Volume change since last measured 12/06/23  DECREASED by 5.14%  Volume change overall %  (Blank rows = not tested)   20 th Visit- BLE COMPARATIVE LIMB VOLUMETRICS  02/01/24   LANDMARK LEFT    L LEG (A-D) 4139.2 ml  L  THIGH (E-G) ml  L FULL LIMB (A-G) ml  Limb Volume differential (LVD)     Volume change since initial DECREASED 10.8 %  Volume change overall %  (Blank rows = not tested)    Mild, Stage  II, 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 Obesity  Skin  Description Hyper-Keratosis Peau' de Orange Shiny Tight Fibrotic/ Indurated Fatty Doughy Spongy/ boggy   x    Bilateral Chronic Lipo dermatosclerosis Hardened, thickened, leathery      Skin dry Flaky WNL Macerated   mildly x     Color  Redness Varicosities Blanching Hemosiderin Stain Mottled   x x x  Severe bilaterally  x   Odor Malodorous Yeast Fungal infection  WNL      x   Temperature Warm Cool wnl    x     Pitting Edema   1+ 2+ 3+ 4+ Non-pitting         x   Girth Symmetrical Asymmetrical                   Distribution    L>R toes to popliteal     Stemmer Sign Positive Negative   +    Lymphorrhea History Of:  Present Absent     x    Wounds History Of Present Absent Venous Arterial Pressure Sheer     x        Signs of Infection Redness Warmth Erythema Acute Swelling Drainage Borders                    Sensation Light Touch Deep pressure Hypersensitivity Neuropathic pain   In Tact Impaired In tact Impaired Absent Impaired Plantar surfaces bilaterally   x  x  x      Nails WNL   Fungus nail dystrophy     x   Hair Growth Symmetrical Asymmetrical   x    Skin Creases Base of toes  Ankles   Base of Fingers knees       Abdominal pannus Thigh Lobules  Face/neck   x           TODAY'S TREATMENT:                                                                                                                                         LLE/LLQ MLD w Simultaneous skin L LEG multilayer gradient compression wraps  PATIENT EDUCATION:  Continued Pt/ CG edu for lymphedema self care home program throughout session. Topics include outcome of comparative limb volumetrics- starting limb volume differentials (LVDs), technology and gradient techniques used for short stretch, multilayer compression wrapping, simple self-MLD, therapeutic lymphatic pumping exercises, skin/nail care, LE precautions,. compression garment recommendations and specifications, wear and care schedule and compression garment donning / doffing w assistive devices. Discussed progress towards all OT goals since commencing CDT. All questions answered to the Pt's satisfaction. Good return. Person educated: Patient  Education method:  Explanation, Demonstration, and Handouts Education comprehension: verbalized understanding, returned demonstration, verbal cues required, and needs further education  HOME EXERCISE PROGRAM: BLE lymphatic pumping there ex using- 1 sets of 10 reps, each exercise in order-  1-2 x daily, bilaterally Simple self MLD 1 x daily Daily skin care to increase hydration, skin mobility and decrease infection risk- can be done during MLD Compression wraps 23/7 until garment fitting complete   ASSESSMENT  CLINICAL IMPRESSION:  Continued LLE MLD today with simultaneous skin care. As usual manual therapy is well tolerated without increased pain. L Limb volume is well reduced with very little excess lymphatic  fluid remaining in tissues.  Continued  knee length compression wrapping as established. Pt spoke with nurse at referring physician's office about DME vendor still awaiting return of signed script. Fit custom LLE compression garment ( Initially ordered in early April) before moving on to Rx of RLE. Cont as per POC.  (Initial Eval 09/13/23: Lavonn Maxcy is a 68 y.o. 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 Pt denies hx of known, chronic limb swelling in her family. She does not utilize compression garments, or a vasopneumatic compression "pump" as conservative measures. Chronic limb swelling and associated pain limits functional performance in all occupational domains, including functional ambulation and mobility, self-care and basic and instrumental ADLs. It limits participation in leisure pursuits, productive activities and social participle[ation.   Pt will benefit from  skilled Occupational Therapy for both  Intensive and Self -management phases of Complete  Decongestive Therapy (CDT) to reduce limb swelling and associated pain, to limit progression, to reduce infection risk , and to increase functional performance, social participation and quality of life. CDT include manual lymphatic drainage (MLD), skin care, therapeutic exercise and compression therapies- multilayer bandaging initially, one limb at a time,  and then appropriate compression garments, or alternatives in the final phase. Pt and caregivers who assist are trained to perform a;; aspects of Lymphedema self-care top ensure optimal self management over time. Without skilled  OT for CDT, further progression of lymphedema is certain and further functional decline is expected.)   OBJECTIVE IMPAIRMENTS: decreased activity tolerance, decreased knowledge of condition, decreased knowledge of use of DME, increased edema, pain, and chronic leg swelling.   ACTIVITY LIMITATIONS: limited functional ambulation and functional mobility 2/2 lymphedema -related foot and leg pain; standing, squatting, stairs, bathing, toileting, dressing, and hygiene/grooming  PARTICIPATION LIMITATIONS: meal prep, cooking, cleaning, driving, shopping, community activity, and volunteering  PERSONAL FACTORS: Time since onset of injury/illness/exacerbation are also affecting patient's functional outcome.   REHAB POTENTIAL: Good  CLINICAL DECISION MAKING: Evolving/moderate complexity  EVALUATION COMPLEXITY: Moderate   GOALS: Goals reviewed with patient? Yes  SHORT TERM GOALS: Target date: 4th OT Rx visit   Pt will demonstrate understanding of lymphedema precautions and prevention strategies with modified independence using a printed reference to identify at least 5 precautions and discussing how s/he may implement them into daily life to reduce risk of progression with extra time. Baseline:Max A Goal status: 11/20/23 GOAL MET  2.  Pt will be able to apply multilayer, knee length, gradient, compression wraps to one leg at  a time with max caregiver assistance to decrease limb volume, to limit infection risk, and to limit lymphedema progression.  Baseline: Dependent Goal status: 11/13/23 GOAL MET  LONG TERM GOALS: Target date: 12/12/22  Given this patient's Intake score of TBD % on the functional outcomes FOTO tool, patient will experience an increase in function of 5 points to improve basic and instrumental ADLs performance, including lymphedema self-care.  Baseline: Max A Goal status: GOAL DEFERRED. FOTO tool discontinued.  2.  Given this patient's Intake score of 64.71 on the Lymphedema Life Impact Scale (LLIS), patient will experience a reduction of at least 5 points in her perceived level of functional impairment resulting from lymphedema to improve functional performance and quality of life (QOL). Baseline: 64.71% Goal status: PROGRESSING  3.  Pt  will achieve at least a 10% volume reduction in B legs to return limb to typical size and shape, to limit infection risk and LE progression, to decrease pain, to improve function. Baseline: Dependent Goal status: MET FOR L LEG with 10.8% REDUCTION.   4.  Pt will obtain proper compression garments/devices and achieve modified independence (extra time + assistive devices) with donning/doffing to optimize limb volume reductions and limit LE progression over time. Baseline:  Goal status: PROGRESSING. LLE custom garment ordered. PLAN:  OT FREQUENCY: 2x/week  OT DURATION: 12 weeks  PLANNED INTERVENTIONS: 97110-Therapeutic exercises, 97530- Therapeutic activity, 97535- Self Care, 13244- Manual therapy, Patient/Family education, Manual lymph drainage, Compression bandaging, DME instructions, and fitting with compression garments  Custom-made gradient compression garments and HOS devices are medically necessary because they are uniquely sized and shaped to fit the exact dimensions of the affected extremities, and to provide appropriate medical grade, graduated  compression essential for optimally managing chronic, progressive lymphedema. Multiple custom compression garments are needed to ensure proper hygiene to limit infection risk. Custom compression garments should be replaced q 3-6 months When worn consistently for optimal lipo-lymphedema self-management over time. HOS devices, medically necessary to limit fibrosis buildup in tissue, should be replaced q 2 years and PRN when worn out.     Pt should be fit:  3 Left and 3 Right, custom, Jobst, flat knit, Elvarex Classic, ccl 2 or 3, knee length compression stockings with open toes to limit and control chronic leg swelling, to improve lymphatic circulation, to reduce infection risk, to limit formation of tissue fibrosis and to limit progression of lymphedema over time. Custom compression garments should be replaced q 3-6 months when worn consistently for optimal lymphedema self-management.   Pt should be fit with 1 R and 1 L custom, Jobst Relax convoluted Hours-of-Sleep (HOS)  compression device to counteract fluid accumulation and fibrosis formation leading to lymphedema progression. at night. HOS devices, medically necessary to limit fibrosis buildup in tissue, should be replaced q 2 years and PRN when worn out.    PLAN FOR NEXT SESSION:  LLE MLD as established using functional inguinal LN and deep abdominal lymphatics. Multilayer compression bandaging to L leg using 1 each 8, 10 and 12 cm wide x 5 meters long short stretch wrap over single layer of rosidal foam and cotton stockinett using gradient techniques. Pt instructed to remove wraps and report symptoms to her doctor if she experiences acute pain in the limb, signs/symptoms of infection, or atypical sob.  Pt / family edu for LE self care: simple self MLD leg sequence  Arnold Bicker, MS, OTR/L, CLT-LANA 03/21/24 3:26 PM

## 2024-03-22 ENCOUNTER — Telehealth: Payer: Self-pay | Admitting: Podiatry

## 2024-03-22 NOTE — Telephone Encounter (Signed)
 Patient states on 5/27 physical therapist said she didn't have order from provider. Can you confirm that it was received?

## 2024-03-25 ENCOUNTER — Ambulatory Visit: Attending: Podiatry | Admitting: Occupational Therapy

## 2024-03-25 DIAGNOSIS — I890 Lymphedema, not elsewhere classified: Secondary | ICD-10-CM | POA: Diagnosis not present

## 2024-03-25 NOTE — Therapy (Signed)
 OUTPATIENT OCCUPATIONAL THERAPY TREATMENT NOTE AND PROGRESS REPORT   BILATERAL LOWER EXTREMITY LYMPHEDEMA Patient Name: [name] MRN: [id number] DOB:1956-06-01, 68 y.o., female Today's Date: 03/25/2024  REPORTING PERIOD: 02/08/24 - 03/25/24  END OF SESSION:   OT End of Session - 03/25/24 1411     Visit Number 30    Number of Visits 36    Date for OT Re-Evaluation 06/23/24    OT Start Time 0200    OT Stop Time 0301    OT Time Calculation (min) 61 min    Activity Tolerance Patient tolerated treatment well;No increased pain    Behavior During Therapy WFL for tasks assessed/performed             Past Medical History:  Diagnosis Date   Allergy    Anemia    Anxiety    Arthritis    Chronic venous insufficiency 12/04/2015   Depression    Fibromyalgia    History of chicken pox    HTN (hypertension)    MRSA (methicillin resistant Staphylococcus aureus)    Obese    Plantar fasciitis    Pneumonia    Seasonal allergies    Sleep apnea 2012   took test in [place]-told to use cpap-could not ude   Past Surgical History:  Procedure Laterality Date   ANTERIOR CERVICAL DECOMP/DISCECTOMY FUSION  05/09/2008   C5-6, C6-7; with arthrodesis also   GASTRIC BYPASS  10/24/2000   KNEE ARTHROSCOPY  10/24/2002   rt and lt knee   ROTATOR CUFF REPAIR W/ DISTAL CLAVICLE EXCISION  01/06/2006   right   SHOULDER ARTHROSCOPY Left 10/24/2001   TONSILLECTOMY     TOTAL KNEE ARTHROPLASTY  03/29/2004   left   TOTAL KNEE ARTHROPLASTY  11/07/2003   right   TOTAL KNEE REVISION  07/30/2010   right; with lateral release   UPPER GASTROINTESTINAL ENDOSCOPY     Patient Active Problem Berry   Diagnosis Date Noted   Venous stasis ulcer of other part of left lower leg limited to breakdown of skin with varicose veins (HCC) 12/28/2023   Stage 3a chronic kidney disease (HCC) 12/28/2023   Essential hypertension 02/14/2022   CAP (community acquired pneumonia) 06/09/2017   ADHD, 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 I don't know when it started. " + family hx of leg swelling-mother  SUBJECTIVE:  SUBJECTIVE STATEMENT: [name] returns to OT to continue Intensive Phase CDT to LLE. Pt presents with compression wraps in place. Pt denies LE related pain. Pt's spouse attends 2nd part of session. Pt reports her doctor's triage nurse confirmed she will send signed script to DME vendor ASAP. Vendor has not ordered garments that OT submitted measurements for in April.  PERTINENT HISTORY: Pls see active problem Berry above  PAIN:  Are you having LE related pain? No: NPRS scale: 0/10 Pain location: feet Pain description: burning Aggravating factors: walking Relieving factors: elevation  PRECAUTIONS: Other: LYMPHEDEMA PRECAUTIONS  WEIGHT BEARING RESTRICTIONS: No  FALLS:  Has patient fallen in last 6 months? 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   OBJECTIVE: Note: Objective measures were completed at Evaluation unless otherwise noted.  COGNITION:  Overall cognitive status: frequent redirection needed during eval to stay on topic   POSTURE: WFL  LE ROM: grossly WFL  STRENGTH: grossly WFL   BLE COMPARATIVE LIMB VOLUMETRICS INITIAL 11/01/23  LANDMARK RIGHT  (dominant)  R LEG (A-D) 4296.6 ml  R THIGH (E-G) ml  R FULL LIMB (A-G) ml  Limb Volume differential (LVD)  %  Volume change since initial %  Volume change overall V  (Blank rows = not tested)  LANDMARK LEFT    L LEG (A-D) 4829.1 ml  L  THIGH (E-G) ml  L FULL LIMB (A-G) ml  Limb Volume differential (LVD)   12.4%, L>R  Volume change since initial %  Volume change overall %  (Blank rows = not tested)    9th Visit 12/06/23        LANDMARK LEFT    L LEG (A-D) 4888.8 ml  L  THIGH (E-G) ml  L FULL LIMB (A-G) ml  Limb Volume differential (LVD)     Volume change since initial  + 1.2 %  Volume change overall %  (Blank rows = not tested)    15 th Visit 01/10/24       LANDMARK LEFT    L LEG (A-D) 4637.8 ml  L  THIGH (E-G) ml  L FULL LIMB (A-G) ml  Limb Volume differential (LVD)     Volume change since last measured 12/06/23  DECREASED by 5.14%  Volume change overall %  (Blank rows = not tested)   20 th Visit- BLE COMPARATIVE LIMB VOLUMETRICS  02/01/24   LANDMARK LEFT    L LEG (A-D) 4139.2 ml  L  THIGH (E-G) ml  L FULL LIMB (A-G) ml  Limb Volume differential (LVD)     Volume change since initial DECREASED 10.8 %  Volume change overall %  (Blank rows = not tested)    30 th Visit- BLE COMPARATIVE LIMB VOLUMETRICS  03/25/24   LANDMARK LEFT    L LEG (A-D) 4173.5 ml  L  THIGH (E-G) ml  L FULL LIMB (A-G) ml  Limb  Volume differential (LVD)     Volume change since LAST MEASURED ON 02/01/24  Inc BY 0.8 %  Volume change  overall %  (Blank rows = not tested)   Mild, Stage  II, 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 Obesity  Skin  Description Hyper-Keratosis Peau' de Orange Shiny Tight Fibrotic/ Indurated Fatty Doughy Spongy/ boggy   x    Bilateral Chronic Lipo dermatosclerosis Hardened, thickened, leathery      Skin dry Flaky WNL Macerated   mildly x     Color Redness Varicosities Blanching Hemosiderin Stain Mottled   x x x  Severe bilaterally x   Odor Malodorous Yeast Fungal infection  WNL      x   Temperature Warm Cool wnl    x     Pitting Edema   1+ 2+ 3+ 4+ Non-pitting         x   Girth Symmetrical Asymmetrical                   Distribution    L>R toes to popliteal     Stemmer Sign Positive Negative   +    Lymphorrhea History Of:  Present Absent     x    Wounds History Of Present Absent Venous Arterial Pressure Sheer     x        Signs of Infection Redness Warmth Erythema Acute Swelling Drainage Borders                    Sensation Light Touch Deep pressure Hypersensitivity Neuropathic pain   In Tact Impaired In tact Impaired Absent Impaired Plantar surfaces bilaterally   x  x  x      Nails WNL   Fungus nail dystrophy     x   Hair Growth Symmetrical Asymmetrical   x    Skin Creases Base of toes  Ankles   Base of Fingers knees       Abdominal pannus Thigh Lobules  Face/neck   x           TODAY'S TREATMENT:                                                                                                                                         LLE/LLQ MLD w Simultaneous skin L LEG multilayer gradient compression wraps  PATIENT EDUCATION:  Continued Pt/ CG edu for lymphedema self care home program throughout session. Topics include outcome of comparative limb volumetrics- starting limb volume differentials (LVDs), technology and gradient  techniques used for short stretch, multilayer compression wrapping, simple self-MLD, therapeutic lymphatic pumping exercises, skin/nail care, LE precautions,. compression garment recommendations and specifications, wear and care schedule and compression garment donning / doffing w assistive devices. Discussed progress towards all OT goals since commencing CDT. All questions answered to the Pt's satisfaction. Good return. Person educated: Patient  Education method: Explanation, Demonstration, and Handouts Education comprehension: verbalized understanding, returned demonstration, verbal cues required, and needs further education  HOME EXERCISE PROGRAM: BLE lymphatic  pumping there ex using- 1 sets of 10 reps, each exercise in order-  1-2 x daily, bilaterally Simple self MLD 1 x daily Daily skin care to increase hydration, skin mobility and decrease infection risk- can be done during MLD Compression wraps 23/7 until garment fitting complete   ASSESSMENT  CLINICAL IMPRESSION: Limb volumetrics reveal leg volume below the L knee is stable with a 0.8% volume  fluctuation only since last measured on 02/01/24. Pt has reached a clinical plateau of volume reduction and she is only awaiting compression garment to be able to move forward with RLE CDT. Delay is causing a hardship for Pt and family. She continues to reach out to referring provider for signed script with correct dx code to be returned to DME vendor. DME vendor has made repeated phone calls to provider's office by report.  Please refer to GOALS section above for detailed progress towards all OT goals for CDT.    Continued LLE MLD today after measuring for volumetrics. Provided simultaneous skin care, as established. As usual manual therapy is well tolerated without increased pain. L Limb volume is well reduced with very little excess lymphatic fluid remaining in tissues.  Continued  knee length compression wrapping as established. Pt spoke with nurse  at referring physician's office about DME vendor still awaiting return of signed script. Fit custom LLE compression garment ( Initially ordered in early April) before moving on to Rx of RLE. Cont as per POC.  (Initial Eval 09/13/23: Mykayla Brinton is a 68 y.o. 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 Pt denies hx of known, chronic limb swelling in her family. She does not utilize compression garments, or a vasopneumatic compression "pump" as conservative measures. Chronic limb swelling and associated pain limits functional performance in all occupational domains, including functional ambulation and mobility, self-care and basic and instrumental ADLs. It limits participation in leisure pursuits, productive activities and social participle[ation.   Pt will benefit from  skilled Occupational Therapy for both  Intensive and Self -management phases of Complete Decongestive Therapy (CDT) to reduce limb swelling and associated pain, to limit progression, to reduce infection risk , and to increase functional performance, social participation and quality of life. CDT include manual lymphatic drainage (MLD), skin care, therapeutic exercise and compression therapies- multilayer bandaging initially, one limb at a time,  and then appropriate compression garments, or alternatives in the final phase. Pt and caregivers who assist are trained to perform a;; aspects of Lymphedema self-care top ensure optimal self management over time. Without skilled  OT for CDT, further progression of lymphedema is certain and further functional decline is expected.)   OBJECTIVE IMPAIRMENTS: decreased activity tolerance, decreased knowledge of condition, decreased knowledge of use of DME,  increased edema, pain, and chronic leg swelling.   ACTIVITY LIMITATIONS: limited functional ambulation and functional mobility 2/2 lymphedema -related foot and leg pain; standing, squatting, stairs, bathing, toileting, dressing, and hygiene/grooming  PARTICIPATION LIMITATIONS: meal prep, cooking, cleaning, driving, shopping, community activity, and volunteering  PERSONAL FACTORS: Time since onset of injury/illness/exacerbation are also affecting patient's functional outcome.   REHAB POTENTIAL: Good  CLINICAL DECISION MAKING: Evolving/moderate complexity  EVALUATION COMPLEXITY: Moderate   GOALS: Goals reviewed with patient? Yes  SHORT TERM GOALS: Target date: 4th OT Rx visit   Pt will demonstrate understanding  of lymphedema precautions and prevention strategies with modified independence using a printed reference to identify at least 5 precautions and discussing how s/he may implement them into daily life to reduce risk of progression with extra time. Baseline:Max A Goal status: 11/20/23 GOAL MET  2.  Pt will be able to apply multilayer, knee length, gradient, compression wraps to one leg at a time with max caregiver assistance to decrease limb volume, to limit infection risk, and to limit lymphedema progression.  Baseline: Dependent Goal status: 11/13/23 GOAL MET  LONG TERM GOALS: Target date: 12/12/22  Given this patient's Intake score of TBD % on the functional outcomes FOTO tool, patient will experience an increase in function of 5 points to improve basic and instrumental ADLs performance, including lymphedema self-care.  Baseline: Max A Goal status: GOAL DEFERRED. FOTO tool discontinued.  2.  Given this patient's Intake score of 64.71 on the Lymphedema Life Impact Scale (LLIS), patient will experience a reduction of at least 5 points in her perceived level of functional impairment resulting from lymphedema to improve functional performance and quality of life (QOL). Baseline:  64.71% Goal status: PROGRESSING  3.  Pt will achieve at least a 10% volume reduction in B legs to return limb to typical size and shape, to limit infection risk and LE progression, to decrease pain, to improve function. Baseline: Dependent Goal status: PARTIALLY MET:  FOR L LEG with 10.8% REDUCTION.   4.  Pt will obtain proper compression garments/devices and achieve modified independence (extra time + assistive devices) with donning/doffing to optimize limb volume reductions and limit LE progression over time. Baseline:  Goal status: PROGRESSING. OT ordered LLE custom garment. Process has been delayed by MD not returning signed script to DME vendor. PLAN:  OT FREQUENCY: 2x/week  OT DURATION: 12 weeks  PLANNED INTERVENTIONS: 97110-Therapeutic exercises, 97530- Therapeutic activity, 97535- Self Care, 31517- Manual therapy, Patient/Family education, Manual lymph drainage, Compression bandaging, DME instructions, and fitting with compression garments  Custom-made gradient compression garments and HOS devices are medically necessary because they are uniquely sized and shaped to fit the exact dimensions of the affected extremities, and to provide appropriate medical grade, graduated compression essential for optimally managing chronic, progressive lymphedema. Multiple custom compression garments are needed to ensure proper hygiene to limit infection risk. Custom compression garments should be replaced q 3-6 months When worn consistently for optimal lipo-lymphedema self-management over time. HOS devices, medically necessary to limit fibrosis buildup in tissue, should be replaced q 2 years and PRN when worn out.     Pt should be fit:  3 Left and 3 Right, custom, Jobst, flat knit, Elvarex Classic, ccl 2 or 3, knee length compression stockings with open toes to limit and control chronic leg swelling, to improve lymphatic circulation, to reduce infection risk, to limit formation of tissue fibrosis and to  limit progression of lymphedema over time. Custom compression garments should be replaced q 3-6 months when worn consistently for optimal lymphedema self-management.   Pt should be fit with 1 R and 1 L custom, Jobst Relax convoluted Hours-of-Sleep (HOS)  compression device to counteract fluid accumulation and fibrosis formation leading to lymphedema progression. at night. HOS devices, medically necessary to limit fibrosis buildup in tissue, should be replaced q 2 years and PRN when worn out.    PLAN FOR NEXT SESSION:  LLE MLD as established using functional inguinal LN and deep abdominal lymphatics. Multilayer compression bandaging to L leg using 1 each 8, 10 and 12 cm wide x  5 meters long short stretch wrap over single layer of rosidal foam and cotton stockinett using gradient techniques. Pt instructed to remove wraps and report symptoms to her doctor if she experiences acute pain in the limb, signs/symptoms of infection, or atypical sob.  Pt / family edu for LE self care: simple self MLD leg sequence  Arnold Bicker, MS, OTR/L, CLT-LANA 03/25/24 3:55 PM

## 2024-03-27 ENCOUNTER — Telehealth: Payer: Self-pay | Admitting: Podiatry

## 2024-03-27 NOTE — Telephone Encounter (Signed)
 Received this from a cc communication from Lymphedema therapist at [facility] today.:  Ot plan of care cert/re-cert    Order Details Protocol: Cosign Required  - Ordering Arlie Lain, OT at 03/26/2024  4:16 PM  Ordering Comment: CLINICAL IMPRESSION: Pt has reached a clinical plateau of volume reduction and she is only awaiting compression garment to be able to move forward with RLE CDT. Delay is causing a hardship for Pt and family. She continues to reach out to referring provider for signed script with correct dx code to be returned to DME vendor. DME vendor has made repeated phone calls to provider's office by report.  Please refer to GOALS section above for detailed progress towards all OT goals  Diagnoses: Lymphedema, not elsewhere classified    Called back and left message that I have not seen this patient since November 2024.  I only received one call routed to me last week from our patient access advocate stating the following:  "Patient states on 5/27 physical therapist said she didn't have order from provider. Can you confirm that it was received? "  I had responded requesting more information since I had not seen this patient in 6 months, but to date, have not had response.  I don't know what the order is for and who it needs to go to.   I am accessible to the therapist directly through SecureChat or Teams if they know the order they need and have been waiting on me to send something.  I have received no calls from any vendors regarding this patient, so I'm not sure where the miscommunication is stemming from here.  I felt it necessary to formally document this since the therapist included those remarks in their Impression.  Until the requesting provider or vendor rep is specific with what they need, and that message gets to me, then I cannot be of assistance.  Dr. Estle Hemp [facility]

## 2024-03-28 ENCOUNTER — Telehealth: Payer: Self-pay | Admitting: Podiatry

## 2024-03-28 ENCOUNTER — Ambulatory Visit: Admitting: Occupational Therapy

## 2024-03-28 DIAGNOSIS — I890 Lymphedema, not elsewhere classified: Secondary | ICD-10-CM | POA: Diagnosis not present

## 2024-03-28 NOTE — Telephone Encounter (Deleted)
 I informed patient on (5/30) that compression stocking order was completed on 5/21 per chart notes.  Patient asks is [facility] completing the compression stocking order? If not, what company? She wants to call and confirm the order.

## 2024-03-29 ENCOUNTER — Telehealth: Payer: Self-pay | Admitting: General Practice

## 2024-03-29 ENCOUNTER — Other Ambulatory Visit: Payer: Self-pay | Admitting: General Practice

## 2024-03-29 MED ORDER — TIRZEPATIDE-WEIGHT MANAGEMENT 10 MG/0.5ML ~~LOC~~ SOLN: 10.0000 mg | SUBCUTANEOUS | 1 refills | Status: DC

## 2024-03-29 NOTE — Therapy (Signed)
 OUTPATIENT OCCUPATIONAL THERAPY TREATMENT NOTE AND PROGRESS REPORT   BILATERAL LOWER EXTREMITY LYMPHEDEMA Patient Name: [name] MRN: [id number] DOB:06/17/1956, 68 y.o., female Today's Date: 03/29/2024  REPORTING PERIOD: 02/08/24 - 03/25/24  END OF SESSION:   OT End of Session - 03/28/24 1514     Visit Number 31    Number of Visits 36    Date for OT Re-Evaluation 06/23/24    OT Start Time 0308    OT Stop Time 0410    OT Time Calculation (min) 62 min    Activity Tolerance Patient tolerated treatment well;No increased pain    Behavior During Therapy WFL for tasks assessed/performed             Past Medical History:  Diagnosis Date   Allergy    Anemia    Anxiety    Arthritis    Chronic venous insufficiency 12/04/2015   Depression    Fibromyalgia    History of chicken pox    HTN (hypertension)    MRSA (methicillin resistant Staphylococcus aureus)    Obese    Plantar fasciitis    Pneumonia    Seasonal allergies    Sleep apnea 2012   took test in [place]-told to use cpap-could not ude   Past Surgical History:  Procedure Laterality Date   ANTERIOR CERVICAL DECOMP/DISCECTOMY FUSION  05/09/2008   C5-6, C6-7; with arthrodesis also   GASTRIC BYPASS  10/24/2000   KNEE ARTHROSCOPY  10/24/2002   rt and lt knee   ROTATOR CUFF REPAIR W/ DISTAL CLAVICLE EXCISION  01/06/2006   right   SHOULDER ARTHROSCOPY Left 10/24/2001   TONSILLECTOMY     TOTAL KNEE ARTHROPLASTY  03/29/2004   left   TOTAL KNEE ARTHROPLASTY  11/07/2003   right   TOTAL KNEE REVISION  07/30/2010   right; with lateral release   UPPER GASTROINTESTINAL ENDOSCOPY     Patient Active Problem List   Diagnosis Date Noted   Venous stasis ulcer of other part of left lower leg limited to breakdown of skin with varicose veins (HCC) 12/28/2023   Stage 3a chronic kidney disease (HCC) 12/28/2023   Essential hypertension 02/14/2022   CAP (community acquired pneumonia) 06/09/2017   ADHD, 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  Rationale for Evaluation and Treatment: Rehabilitation  ONSET DATE: "I've had leg swelling for years. I don't know when it started. " + family hx of leg swelling-mother  SUBJECTIVE:  SUBJECTIVE STATEMENT: [name] returns to OT to continue Intensive Phase CDT to LLE. Pt presents with compression wraps in place. Pt denies LE related pain. Pt's spouse attends 2nd part of session. Pt reports her doctor's triage nurse confirmed she will send signed script to DME vendor ASAP. Vendor has not ordered garments that OT submitted measurements for in April. OT made 2 attempts today to return referring physician's call , but unable to get call answered. PERTINENT HISTORY: Pls see active problem list above  PAIN:  Are you having LE related pain? No: NPRS scale: 0/10 Pain location: feet Pain description: burning Aggravating factors: walking Relieving factors: elevation  PRECAUTIONS: Other: LYMPHEDEMA PRECAUTIONS  WEIGHT BEARING RESTRICTIONS: No  FALLS:  Has patient fallen in last 6 months? 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   OBJECTIVE: Note: Objective measures were completed at Evaluation unless otherwise noted.  COGNITION:  Overall cognitive status: frequent redirection needed during eval to stay on topic   POSTURE: WFL  LE ROM: grossly WFL  STRENGTH: grossly WFL   BLE COMPARATIVE LIMB VOLUMETRICS INITIAL 11/01/23  LANDMARK RIGHT  (dominant)  R LEG (A-D) 4296.6 ml  R THIGH (E-G) ml  R FULL LIMB (A-G) ml  Limb Volume differential (LVD)  %  Volume change since initial %  Volume change overall V  (Blank rows = not tested)  LANDMARK LEFT    L LEG (A-D) 4829.1 ml  L  THIGH (E-G) ml  L FULL LIMB (A-G) ml  Limb Volume differential (LVD)   12.4%, L>R  Volume change since initial %  Volume change overall %  (Blank rows = not tested)    9th Visit 12/06/23        LANDMARK LEFT    L LEG (A-D) 4888.8 ml  L  THIGH (E-G) ml  L FULL LIMB (A-G) ml  Limb Volume differential (LVD)     Volume change since initial  + 1.2 %  Volume change overall %  (Blank rows = not tested)    15 th Visit 01/10/24       LANDMARK LEFT    L LEG (A-D) 4637.8 ml  L  THIGH (E-G) ml  L FULL LIMB (A-G) ml  Limb Volume differential (LVD)     Volume change since last measured 12/06/23  DECREASED by 5.14%  Volume change overall %  (Blank rows = not tested)   20 th Visit- BLE COMPARATIVE LIMB VOLUMETRICS  02/01/24   LANDMARK LEFT    L LEG (A-D) 4139.2 ml  L  THIGH (E-G) ml  L FULL LIMB (A-G) ml  Limb Volume differential (LVD)     Volume change since initial DECREASED 10.8 %  Volume change overall %  (Blank rows = not tested)    30 th Visit- BLE COMPARATIVE LIMB VOLUMETRICS  03/25/24   LANDMARK LEFT     L LEG (A-D) 4173.5 ml  L  THIGH (E-G) ml  L FULL LIMB (A-G) ml  Limb Volume differential (LVD)  Volume change since LAST MEASURED ON 02/01/24  Inc BY 0.8 %  Volume change overall %  (Blank rows = not tested)   Mild, Stage  II, 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 Obesity  Skin  Description Hyper-Keratosis Peau' de Orange Shiny Tight Fibrotic/ Indurated Fatty Doughy Spongy/ boggy   x    Bilateral Chronic Lipo dermatosclerosis Hardened, thickened, leathery      Skin dry Flaky WNL Macerated   mildly x     Color Redness Varicosities Blanching Hemosiderin Stain Mottled   x x x  Severe bilaterally x   Odor Malodorous Yeast Fungal infection  WNL      x   Temperature Warm Cool wnl    x     Pitting Edema   1+ 2+ 3+ 4+ Non-pitting         x   Girth Symmetrical Asymmetrical                   Distribution    L>R toes to popliteal     Stemmer Sign Positive Negative   +    Lymphorrhea History Of:  Present Absent     x    Wounds History Of Present Absent Venous Arterial Pressure Sheer     x        Signs of Infection Redness Warmth Erythema Acute Swelling Drainage Borders                    Sensation Light Touch Deep pressure Hypersensitivity Neuropathic pain   In Tact Impaired In tact Impaired Absent Impaired Plantar surfaces bilaterally   x  x  x      Nails WNL   Fungus nail dystrophy     x   Hair Growth Symmetrical Asymmetrical   x    Skin Creases Base of toes  Ankles   Base of Fingers knees       Abdominal pannus Thigh Lobules  Face/neck   x           TODAY'S TREATMENT:                                                                                                                                         LLE/LLQ MLD w Simultaneous skin L LEG multilayer gradient compression wraps  PATIENT EDUCATION:  Continued Pt/ CG edu for lymphedema self care home program throughout session. Topics include outcome of comparative limb  volumetrics- starting limb volume differentials (LVDs), technology and gradient techniques used for short stretch, multilayer compression wrapping, simple self-MLD, therapeutic lymphatic pumping exercises, skin/nail care, LE precautions,. compression garment recommendations and specifications, wear and care schedule and compression garment donning / doffing w assistive devices. Discussed progress towards all OT goals since commencing CDT. All questions answered to the Pt's satisfaction. Good return. Person educated: Patient  Education method: Explanation, Demonstration, and Handouts Education comprehension: verbalized understanding,  returned demonstration, verbal cues required, and needs further education  HOME EXERCISE PROGRAM: BLE lymphatic pumping there ex using- 1 sets of 10 reps, each exercise in order-  1-2 x daily, bilaterally Simple self MLD 1 x daily Daily skin care to increase hydration, skin mobility and decrease infection risk- can be done during MLD Compression wraps 23/7 until garment fitting complete   ASSESSMENT  CLINICAL IMPRESSION:   Continued LLE MLD as established. Provided simultaneous skin care, as established. As usual manual therapy is well tolerated without increased pain. L Limb volume is well reduced with very little excess lymphatic fluid remaining in tissues.  Continued  knee length compression wrapping as established.DME vendor still awaiting return of signed script from referring physician. Discussed requesting script from PCP as alternative and obtained contact info    from Pt for DME provider.Cont as per POC.  (Initial Eval 09/13/23: Zionna Homewood is a 68 y.o. 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 Pt denies hx of  known, chronic limb swelling in her family. She does not utilize compression garments, or a vasopneumatic compression "pump" as conservative measures. Chronic limb swelling and associated pain limits functional performance in all occupational domains, including functional ambulation and mobility, self-care and basic and instrumental ADLs. It limits participation in leisure pursuits, productive activities and social participle[ation.   Pt will benefit from  skilled Occupational Therapy for both  Intensive and Self -management phases of Complete Decongestive Therapy (CDT) to reduce limb swelling and associated pain, to limit progression, to reduce infection risk , and to increase functional performance, social participation and quality of life. CDT include manual lymphatic drainage (MLD), skin care, therapeutic exercise and compression therapies- multilayer bandaging initially, one limb at a time,  and then appropriate compression garments, or alternatives in the final phase. Pt and caregivers who assist are trained to perform a;; aspects of Lymphedema self-care top ensure optimal self management over time. Without skilled  OT for CDT, further progression of lymphedema is certain and further functional decline is expected.)   OBJECTIVE IMPAIRMENTS: decreased activity tolerance, decreased knowledge of condition, decreased knowledge of use of DME, increased edema, pain, and chronic leg swelling.   ACTIVITY LIMITATIONS: limited functional ambulation and functional mobility 2/2 lymphedema -related foot and leg pain; standing, squatting, stairs, bathing, toileting, dressing, and hygiene/grooming  PARTICIPATION LIMITATIONS: meal prep, cooking, cleaning, driving, shopping, community activity, and volunteering  PERSONAL FACTORS: Time since onset of injury/illness/exacerbation are also affecting patient's functional outcome.   REHAB POTENTIAL: Good  CLINICAL DECISION MAKING: Evolving/moderate  complexity  EVALUATION COMPLEXITY: Moderate   GOALS: Goals reviewed with patient? Yes  SHORT TERM GOALS: Target date: 4th OT Rx visit   Pt will demonstrate understanding of lymphedema precautions and prevention strategies with modified independence using a printed reference to identify at least 5 precautions and discussing how s/he may implement them into daily life to reduce risk of progression with extra time. Baseline:Max A Goal status: 11/20/23 GOAL MET  2.  Pt will be able to apply multilayer, knee length, gradient, compression wraps to one leg at a time with max caregiver assistance to decrease limb volume, to limit infection risk, and to limit lymphedema progression.  Baseline: Dependent Goal status: 11/13/23 GOAL MET  LONG TERM GOALS: Target date: 12/12/22  Given this patient's Intake  score of TBD % on the functional outcomes FOTO tool, patient will experience an increase in function of 5 points to improve basic and instrumental ADLs performance, including lymphedema self-care.  Baseline: Max A Goal status: GOAL DEFERRED. FOTO tool discontinued.  2.  Given this patient's Intake score of 64.71 on the Lymphedema Life Impact Scale (LLIS), patient will experience a reduction of at least 5 points in her perceived level of functional impairment resulting from lymphedema to improve functional performance and quality of life (QOL). Baseline: 64.71% Goal status: PROGRESSING  3.  Pt will achieve at least a 10% volume reduction in B legs to return limb to typical size and shape, to limit infection risk and LE progression, to decrease pain, to improve function. Baseline: Dependent Goal status: PARTIALLY MET:  FOR L LEG with 10.8% REDUCTION.   4.  Pt will obtain proper compression garments/devices and achieve modified independence (extra time + assistive devices) with donning/doffing to optimize limb volume reductions and limit LE progression over time. Baseline:  Goal status: PROGRESSING.  OT ordered LLE custom garment on 02/07/24. Process has been delayed by MD not returning signed script to DME vendor. PLAN:  OT FREQUENCY: 2x/week  OT DURATION: 12 weeks  PLANNED INTERVENTIONS: 97110-Therapeutic exercises, 97530- Therapeutic activity, 97535- Self Care, 45409- Manual therapy, Patient/Family education, Manual lymph drainage, Compression bandaging, DME instructions, and fitting with compression garments  Custom-made gradient compression garments and HOS devices are medically necessary because they are uniquely sized and shaped to fit the exact dimensions of the affected extremities, and to provide appropriate medical grade, graduated compression essential for optimally managing chronic, progressive lymphedema. Multiple custom compression garments are needed to ensure proper hygiene to limit infection risk. Custom compression garments should be replaced q 3-6 months When worn consistently for optimal lipo-lymphedema self-management over time. HOS devices, medically necessary to limit fibrosis buildup in tissue, should be replaced q 2 years and PRN when worn out.     Pt should be fit:  3 Left and 3 Right, custom, Jobst, flat knit, Elvarex Classic, ccl 2 or 3, knee length compression stockings with open toes to limit and control chronic leg swelling, to improve lymphatic circulation, to reduce infection risk, to limit formation of tissue fibrosis and to limit progression of lymphedema over time. Custom compression garments should be replaced q 3-6 months when worn consistently for optimal lymphedema self-management.   Pt should be fit with 1 R and 1 L custom, Jobst Relax convoluted Hours-of-Sleep (HOS)  compression device to counteract fluid accumulation and fibrosis formation leading to lymphedema progression. at night. HOS devices, medically necessary to limit fibrosis buildup in tissue, should be replaced q 2 years and PRN when worn out.    PLAN FOR NEXT SESSION:  LLE MLD as  established using functional inguinal LN and deep abdominal lymphatics. Multilayer compression bandaging to L leg using 1 each 8, 10 and 12 cm wide x 5 meters long short stretch wrap over single layer of rosidal foam and cotton stockinett using gradient techniques. Pt instructed to remove wraps and report symptoms to her doctor if she experiences acute pain in the limb, signs/symptoms of infection, or atypical sob.  Pt / family edu for LE self care: simple self MLD leg sequence  Arnold Bicker, MS, OTR/L, CLT-LANA 03/29/24 12:10 PM

## 2024-03-29 NOTE — Telephone Encounter (Signed)
 I received a call from [name] at [facility] rehab and she was stating that patient has been waiting on compression stockings since April. She also stated that [facility] has been waiting on order to be faxed back to them. We have not received anything from [facility]. I have tried to call them twice and each time no one has answered the phone. I have left 2 voicemail message for someone to return my call, but have not  received a call back.

## 2024-03-29 NOTE — Telephone Encounter (Signed)
 Copied from CRM [id number]. Topic: Clinical - Medication Question >> Mar 28, 2024  4:44 PM Magdalene [employer] wrote: Reason for CRM: Patient calling because she is seeing [facility] and they sent her to [facility] for physical therapy. She stated that Heath Litten, her occupational therapist from [facility] recommended order for compression hose for left foot but stated that it needs to be ordered by [facility] but patient has been having a hard time getting them to send the prescription. She was advised by [facility] to contact her PCP to see if NP Deborra Falter can send the prescription for her. Patient is available for call back at [phone] if any other information is needed.  It will need to be sent to: [facility] Address: [address], [place], [place] [address] Phone: [phone]  Heath Litten - Occupational therapist at [facility] Phone: [phone]

## 2024-03-29 NOTE — Telephone Encounter (Signed)
 I received a call from Glenora Laos at [facility] rehab and she was stating that patient has been waiting on compression stockings since April. She also stated that [facility] has been waiting on order to be faxed back to them. We have not received anything from [facility]. I have tried to call them twice and each time no one has answered the phone. I have left 2 voicemail message for someone to return my call, but have not  received a call back.

## 2024-04-01 ENCOUNTER — Ambulatory Visit: Admitting: Occupational Therapy

## 2024-04-03 DIAGNOSIS — L4050 Arthropathic psoriasis, unspecified: Secondary | ICD-10-CM | POA: Diagnosis not present

## 2024-04-03 DIAGNOSIS — M1990 Unspecified osteoarthritis, unspecified site: Secondary | ICD-10-CM | POA: Diagnosis not present

## 2024-04-03 DIAGNOSIS — M2550 Pain in unspecified joint: Secondary | ICD-10-CM | POA: Diagnosis not present

## 2024-04-03 DIAGNOSIS — Z872 Personal history of diseases of the skin and subcutaneous tissue: Secondary | ICD-10-CM | POA: Diagnosis not present

## 2024-04-03 DIAGNOSIS — M549 Dorsalgia, unspecified: Secondary | ICD-10-CM | POA: Diagnosis not present

## 2024-04-03 DIAGNOSIS — Z79899 Other long term (current) drug therapy: Secondary | ICD-10-CM | POA: Diagnosis not present

## 2024-04-03 DIAGNOSIS — M79644 Pain in right finger(s): Secondary | ICD-10-CM | POA: Diagnosis not present

## 2024-04-03 DIAGNOSIS — M797 Fibromyalgia: Secondary | ICD-10-CM | POA: Diagnosis not present

## 2024-04-03 NOTE — Telephone Encounter (Addendum)
 FYI:  I faxed the order over. [facility] faxed over their order form for you to sign. It had another provider's  name and NPI number on the form. I tried calling [facility] twice to speak with someone to ask them to update to your name and NPI number and to refax. I received no answer. I faxed the form back requesting the change be made. I still have not received fax with updated information nor a call back. I called again today,but had to leave a message because they are closed.

## 2024-04-04 ENCOUNTER — Ambulatory Visit: Admitting: Occupational Therapy

## 2024-04-08 ENCOUNTER — Encounter: Payer: Self-pay | Admitting: Occupational Therapy

## 2024-04-08 ENCOUNTER — Ambulatory Visit: Admitting: Occupational Therapy

## 2024-04-08 DIAGNOSIS — I890 Lymphedema, not elsewhere classified: Secondary | ICD-10-CM

## 2024-04-08 NOTE — Therapy (Unsigned)
 OUTPATIENT OCCUPATIONAL THERAPY TREATMENT NOTE   BILATERAL LOWER EXTREMITY LYMPHEDEMA Patient Name: [name] MRN: [id number] DOB:11/30/1955, 68 y.o., female Today's Date: 04/08/2024  REPORTING PERIOD:   END OF SESSION:   OT End of Session - 04/08/24 1417     Visit Number 32    Number of Visits 36    Date for OT Re-Evaluation 06/23/24    Activity Tolerance Patient tolerated treatment well;No increased pain    Behavior During Therapy WFL for tasks assessed/performed          Past Medical History:  Diagnosis Date   Allergy    Anemia    Anxiety    Arthritis    Chronic venous insufficiency 12/04/2015   Depression    Fibromyalgia    History of chicken pox    HTN (hypertension)    MRSA (methicillin resistant Staphylococcus aureus)    Obese    Plantar fasciitis    Pneumonia    Seasonal allergies    Sleep apnea 2012   took test in [place]-told to use cpap-could not ude   Past Surgical History:  Procedure Laterality Date   ANTERIOR CERVICAL DECOMP/DISCECTOMY FUSION  05/09/2008   C5-6, C6-7; with arthrodesis also   GASTRIC BYPASS  10/24/2000   KNEE ARTHROSCOPY  10/24/2002   rt and lt knee   ROTATOR CUFF REPAIR W/ DISTAL CLAVICLE EXCISION  01/06/2006   right   SHOULDER ARTHROSCOPY Left 10/24/2001   TONSILLECTOMY     TOTAL KNEE ARTHROPLASTY  03/29/2004   left   TOTAL KNEE ARTHROPLASTY  11/07/2003   right   TOTAL KNEE REVISION  07/30/2010   right; with lateral release   UPPER GASTROINTESTINAL ENDOSCOPY     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 I don't know when it started.  + family hx of leg swelling-mother  SUBJECTIVE:  SUBJECTIVE STATEMENT: ILEAN SPRADLIN returns to OT to continue Intensive Phase CDT to LLE. Pt presents with compression wraps in place. Pt reports LE related leg pain as 3/10. Pt's spouse does not attend session today.  PAIN:  Are you having LE related pain? No: NPRS scale: 0/10 Pain location: feet Pain description: burning Aggravating factors: walking Relieving factors: elevation  PRECAUTIONS: Other: LYMPHEDEMA PRECAUTIONS  WEIGHT BEARING RESTRICTIONS: No  FALLS:  Has patient fallen in last 6 months? 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   OBJECTIVE: Note: Objective measures were completed at Evaluation unless otherwise  noted.  COGNITION:  Overall cognitive status: frequent redirection needed during eval to stay on topic   POSTURE: WFL  LE ROM: grossly WFL  STRENGTH: grossly WFL   BLE COMPARATIVE LIMB VOLUMETRICS INITIAL 11/01/23  LANDMARK RIGHT  (dominant)  R LEG (A-D) 4296.6 ml  R THIGH (E-G) ml  R FULL LIMB (A-G) ml  Limb Volume differential (LVD)  %  Volume change since initial %  Volume change overall V  (Blank rows = not tested)  LANDMARK LEFT    L LEG (A-D) 4829.1 ml  L  THIGH (E-G) ml  L FULL LIMB (A-G) ml  Limb Volume differential (LVD)   12.4%, L>R  Volume change since initial %  Volume change overall %  (Blank rows = not tested)    9th Visit 12/06/23        LANDMARK LEFT    L LEG (A-D) 4888.8 ml  L  THIGH (E-G) ml  L FULL LIMB (A-G) ml  Limb Volume differential (LVD)     Volume change since initial  + 1.2 %  Volume change overall %  (Blank rows = not tested)    15 th Visit 01/10/24       LANDMARK LEFT    L LEG (A-D) 4637.8 ml  L  THIGH (E-G) ml  L FULL LIMB (A-G) ml  Limb Volume differential (LVD)     Volume change since last measured 12/06/23  DECREASED by 5.14%  Volume change overall %  (Blank rows = not tested)   20 th Visit- BLE COMPARATIVE LIMB VOLUMETRICS  02/01/24   LANDMARK LEFT    L LEG (A-D) 4139.2 ml  L  THIGH (E-G) ml  L FULL LIMB (A-G) ml  Limb Volume differential (LVD)     Volume change since initial DECREASED 10.8 %  Volume change overall %  (Blank rows = not tested)    30 th Visit- BLE COMPARATIVE LIMB VOLUMETRICS  03/25/24   LANDMARK LEFT    L LEG (A-D) 4173.5 ml  L  THIGH (E-G) ml  L FULL LIMB (A-G) ml  Limb Volume differential (LVD)     Volume change since LAST MEASURED ON 02/01/24  Inc BY 0.8 %  Volume change overall %  (Blank rows = not tested)   Mild, Stage  II, 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 Obesity  Skin  Description Hyper-Keratosis Peau' de Orange Shiny Tight Fibrotic/ Indurated Fatty  Doughy Spongy/ boggy    x    Bilateral Chronic Lipo dermatosclerosis Hardened, thickened, leathery      Skin dry Flaky WNL Macerated   mildly x     Color Redness Varicosities Blanching Hemosiderin Stain Mottled   x x x  Severe bilaterally x   Odor Malodorous Yeast Fungal infection  WNL      x   Temperature Warm Cool wnl    x     Pitting Edema   1+ 2+ 3+ 4+ Non-pitting         x   Girth Symmetrical Asymmetrical                   Distribution    L>R toes to popliteal     Stemmer Sign Positive Negative   +    Lymphorrhea History Of:  Present Absent     x    Wounds History Of Present Absent Venous Arterial Pressure Sheer     x        Signs of Infection Redness Warmth Erythema Acute Swelling Drainage Borders                    Sensation Light Touch Deep pressure Hypersensitivity Neuropathic pain   In Tact Impaired In tact Impaired Absent Impaired Plantar surfaces bilaterally   x  x  x      Nails WNL   Fungus nail dystrophy     x   Hair Growth Symmetrical Asymmetrical   x    Skin Creases Base of toes  Ankles   Base of Fingers knees       Abdominal pannus Thigh Lobules  Face/neck   x           TODAY'S TREATMENT:                                                                                                                                         LLE/LLQ MLD w Simultaneous skin L LEG multilayer gradient compression wraps  PATIENT EDUCATION:  Continued Pt/ CG edu for lymphedema self care home program throughout session. Topics include outcome of comparative limb volumetrics- starting limb volume differentials (LVDs), technology and gradient techniques used for short stretch, multilayer compression wrapping, simple self-MLD, therapeutic lymphatic pumping exercises, skin/nail care, LE precautions,. compression garment recommendations and specifications, wear and care schedule and compression garment donning / doffing w assistive devices. Discussed progress towards all OT goals  since commencing CDT. All questions answered to the Pt's satisfaction. Good return. Person educated: Patient  Education method: Explanation, Demonstration, and Handouts Education comprehension: verbalized understanding, returned demonstration, verbal cues required, and needs further education  HOME EXERCISE PROGRAM: BLE lymphatic pumping there ex using- 1 sets of 10 reps, each exercise in order-  1-2 x daily, bilaterally Simple self MLD 1 x daily Daily skin care to increase hydration, skin mobility and decrease  infection risk- can be done during MLD Compression wraps 23/7 until garment fitting complete   ASSESSMENT  CLINICAL IMPRESSION:   Continued LLE MLD as established. Provided simultaneous skin care, as established. As usual manual therapy is well tolerated without increased pain. L Limb volume is well reduced with very little excess lymphatic fluid remaining in tissues.  Continued  knee length compression wrapping as established.DME vendor still awaiting return of signed script from referring physician. Discussed requesting script from PCP as alternative and obtained contact info    from Pt for DME provider.Cont as per POC.  (Initial Eval 09/13/23: Anjali Manzella is a 68 y.o. 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 Pt denies hx of known, chronic limb swelling in her family. She does not utilize compression garments, or a vasopneumatic compression pump as conservative measures. Chronic limb swelling and associated pain limits functional performance in all occupational domains, including functional ambulation and mobility, self-care and basic and instrumental ADLs. It limits participation in leisure pursuits, productive activities and social  participle[ation.   Pt will benefit from  skilled Occupational Therapy for both  Intensive and Self -management phases of Complete Decongestive Therapy (CDT) to reduce limb swelling and associated pain, to limit progression, to reduce infection risk , and to increase functional performance, social participation and quality of life. CDT include manual lymphatic drainage (MLD), skin care, therapeutic exercise and compression therapies- multilayer bandaging initially, one limb at a time,  and then appropriate compression garments, or alternatives in the final phase. Pt and caregivers who assist are trained to perform a;; aspects of Lymphedema self-care top ensure optimal self management over time. Without skilled  OT for CDT, further progression of lymphedema is certain and further functional decline is expected.)   OBJECTIVE IMPAIRMENTS: decreased activity tolerance, decreased knowledge of condition, decreased knowledge of use of DME, increased edema, pain, and chronic leg swelling.   ACTIVITY LIMITATIONS: limited functional ambulation and functional mobility 2/2 lymphedema -related foot and leg pain; standing, squatting, stairs, bathing, toileting, dressing, and hygiene/grooming  PARTICIPATION LIMITATIONS: meal prep, cooking, cleaning, driving, shopping, community activity, and volunteering  PERSONAL FACTORS: Time since onset of injury/illness/exacerbation are also affecting patient's functional outcome.   REHAB POTENTIAL: Good  CLINICAL DECISION MAKING: Evolving/moderate complexity  EVALUATION COMPLEXITY: Moderate   GOALS: Goals reviewed with patient? Yes  SHORT TERM GOALS: Target date: 4th OT Rx visit   Pt will demonstrate understanding of lymphedema precautions and prevention strategies with modified independence using a printed reference to identify at least 5 precautions and discussing how s/he may implement them into daily life to reduce risk of progression with extra  time. Baseline:Max A Goal status: 11/20/23 GOAL MET  2.  Pt will be able to apply multilayer, knee length, gradient, compression wraps to one leg at a time with max caregiver assistance to decrease limb volume, to limit infection risk, and to limit lymphedema progression.  Baseline: Dependent Goal status: 11/13/23 GOAL MET  LONG TERM GOALS: Target date: 12/12/22  Given this patient's Intake score of TBD % on the functional outcomes FOTO tool, patient will experience an increase in function of 5 points to improve basic and instrumental ADLs performance, including lymphedema self-care.  Baseline: Max A Goal status: GOAL DEFERRED. FOTO tool discontinued.  2.  Given this patient's Intake score  of 64.71 on the Lymphedema Life Impact Scale (LLIS), patient will experience a reduction of at least 5 points in her perceived level of functional impairment resulting from lymphedema to improve functional performance and quality of life (QOL). Baseline: 64.71% Goal status: PROGRESSING  3.  Pt will achieve at least a 10% volume reduction in B legs to return limb to typical size and shape, to limit infection risk and LE progression, to decrease pain, to improve function. Baseline: Dependent Goal status: PARTIALLY MET:  FOR L LEG with 10.8% REDUCTION.   4.  Pt will obtain proper compression garments/devices and achieve modified independence (extra time + assistive devices) with donning/doffing to optimize limb volume reductions and limit LE progression over time. Baseline:  Goal status: PROGRESSING. OT ordered LLE custom garment on 02/07/24. Process has been delayed by MD not returning signed script to DME vendor. PLAN:  OT FREQUENCY: 2x/week  OT DURATION: 12 weeks  PLANNED INTERVENTIONS: 97110-Therapeutic exercises, 97530- Therapeutic activity, 97535- Self Care, 16109- Manual therapy, Patient/Family education, Manual lymph drainage, Compression bandaging, DME instructions, and fitting with compression  garments  Custom-made gradient compression garments and HOS devices are medically necessary because they are uniquely sized and shaped to fit the exact dimensions of the affected extremities, and to provide appropriate medical grade, graduated compression essential for optimally managing chronic, progressive lymphedema. Multiple custom compression garments are needed to ensure proper hygiene to limit infection risk. Custom compression garments should be replaced q 3-6 months When worn consistently for optimal lipo-lymphedema self-management over time. HOS devices, medically necessary to limit fibrosis buildup in tissue, should be replaced q 2 years and PRN when worn out.     Pt should be fit:  3 Left and 3 Right, custom, Jobst, flat knit, Elvarex Classic, ccl 2 or 3, knee length compression stockings with open toes to limit and control chronic leg swelling, to improve lymphatic circulation, to reduce infection risk, to limit formation of tissue fibrosis and to limit progression of lymphedema over time. Custom compression garments should be replaced q 3-6 months when worn consistently for optimal lymphedema self-management.   Pt should be fit with 1 R and 1 L custom, Jobst Relax convoluted Hours-of-Sleep (HOS)  compression device to counteract fluid accumulation and fibrosis formation leading to lymphedema progression. at night. HOS devices, medically necessary to limit fibrosis buildup in tissue, should be replaced q 2 years and PRN when worn out.    PLAN FOR NEXT SESSION:  LLE MLD as established using functional inguinal LN and deep abdominal lymphatics. Multilayer compression bandaging to L leg using 1 each 8, 10 and 12 cm wide x 5 meters long short stretch wrap over single layer of rosidal foam and cotton stockinett using gradient techniques. Pt instructed to remove wraps and report symptoms to her doctor if she experiences acute pain in the limb, signs/symptoms of infection, or atypical sob.  Pt /  family edu for LE self care: simple self MLD leg sequence  Arnold Bicker, MS, OTR/L, CLT-LANA 04/08/24 2:18 PM

## 2024-04-10 ENCOUNTER — Encounter: Payer: Self-pay | Admitting: General Practice

## 2024-04-10 ENCOUNTER — Other Ambulatory Visit (HOSPITAL_COMMUNITY): Payer: Self-pay

## 2024-04-10 ENCOUNTER — Ambulatory Visit (INDEPENDENT_AMBULATORY_CARE_PROVIDER_SITE_OTHER): Admitting: General Practice

## 2024-04-10 VITALS — BP 122/82 | HR 79 | Temp 98.3°F | Ht 66.0 in | Wt 244.0 lb

## 2024-04-10 DIAGNOSIS — G894 Chronic pain syndrome: Secondary | ICD-10-CM | POA: Diagnosis not present

## 2024-04-10 DIAGNOSIS — E66812 Obesity, class 2: Secondary | ICD-10-CM | POA: Diagnosis not present

## 2024-04-10 DIAGNOSIS — M150 Primary generalized (osteo)arthritis: Secondary | ICD-10-CM | POA: Diagnosis not present

## 2024-04-10 DIAGNOSIS — F32A Depression, unspecified: Secondary | ICD-10-CM

## 2024-04-10 DIAGNOSIS — M62831 Muscle spasm of calf: Secondary | ICD-10-CM | POA: Diagnosis not present

## 2024-04-10 DIAGNOSIS — Z6839 Body mass index (BMI) 39.0-39.9, adult: Secondary | ICD-10-CM | POA: Diagnosis not present

## 2024-04-10 DIAGNOSIS — M25551 Pain in right hip: Secondary | ICD-10-CM | POA: Diagnosis not present

## 2024-04-10 MED ORDER — TRAZODONE HCL 50 MG PO TABS
50.0000 mg | ORAL_TABLET | Freq: Every day | ORAL | 2 refills | Status: AC
  Filled 2024-04-10: qty 30, 30d supply, fill #0

## 2024-04-10 MED ORDER — DULOXETINE HCL 60 MG PO CPEP: 120.0000 mg | ORAL_CAPSULE | Freq: Every day | ORAL | 0 refills | Status: DC

## 2024-04-10 MED ORDER — MORPHINE SULFATE ER 30 MG PO TBCR
30.0000 mg | EXTENDED_RELEASE_TABLET | Freq: Two times a day (BID) | ORAL | 0 refills | Status: AC
  Filled 2024-05-10: qty 60, 30d supply, fill #0

## 2024-04-10 MED ORDER — MORPHINE SULFATE ER 30 MG PO TBCR
30.0000 mg | EXTENDED_RELEASE_TABLET | Freq: Two times a day (BID) | ORAL | 0 refills | Status: AC
  Filled 2024-04-10: qty 60, 30d supply, fill #0

## 2024-04-10 MED ORDER — TIZANIDINE HCL 2 MG PO TABS
2.0000 mg | ORAL_TABLET | Freq: Three times a day (TID) | ORAL | 2 refills | Status: DC | PRN
  Filled 2024-04-10: qty 90, 30d supply, fill #0
  Filled 2024-05-09: qty 90, 30d supply, fill #1

## 2024-04-10 NOTE — Assessment & Plan Note (Signed)
 Discussed the importance of healthy diet and exercise to affect sustainable weight loss.

## 2024-04-10 NOTE — Assessment & Plan Note (Signed)
 Slightly better.  Denies si/hi.  Declines therapy referral.   Discussed treatment options.  Agreeable to increase Cymbalta  at this time.  Start Cymbalta  120 mg once daily. F/u in 8 weeks.

## 2024-04-10 NOTE — Patient Instructions (Addendum)
 Increase Cymbalta  to 120 mg once daily.   Rx sent.   F/u in 8 weeks.  It was a pleasure to see you today!

## 2024-04-10 NOTE — Progress Notes (Signed)
 Established Patient Office Visit  Subjective   Patient ID: [name], female    DOB: 02/01/1956  Age: 68 y.o. MRN: [id number]  Chief Complaint  Patient presents with   Depression    Patient currently taking cymbalta  60 mg and 30 mg; does not feel much difference since last month.     Depression        Associated symptoms include no headaches and no suicidal ideas.   [name] is a 68 year old female with past medical history of hypertension, venous stasis ulcer of left lower leg, stage IIIa chronic kidney disease, cirrhotic arthritis, depressive order, anxiety, alcohol abuse, fibromyalgia, morbid obesity presents today for follow up.   Depression: She was evaluated on 03/11/24 and her Cymbalta  was increased from 60 mg to 90 mg once daily. Today she reports that she has not seen a difference yet. She does feel that she is less on the edge. She has been crying less. She denies SI/HI.   Obesity: She was evaluated on 03/11/24 when the refill for zepbound  was sent. Today she reports that she did not start the medication due to the cost as her insurance did not cover. It would cost $995 for one month supply.   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   Insomnia    Psoriatic arthritis (HCC)    Cough    Chronic anemia 12/05/2015   Hoarding disorder 03/05/2015   Alcohol abuse 11/21/2014   Anxiety state 11/21/2014   Obesity 11/21/2014   Hematemesis 03/07/2014   Depressive disorder 12/09/2013   GAD (generalized anxiety disorder) 09/17/2013   Fibromyalgia 07/22/2013   Past Medical History:  Diagnosis Date   Allergy    Anemia    Anxiety    Arthritis    Chronic  venous insufficiency 12/04/2015   Depression    Fibromyalgia    History of chicken pox    HTN (hypertension)    MRSA (methicillin resistant Staphylococcus aureus)    Obese    Plantar fasciitis    Pneumonia    Seasonal allergies    Sleep apnea 2012   took test in [place]-told to use cpap-could not ude   Past Surgical History:  Procedure Laterality Date   ANTERIOR CERVICAL DECOMP/DISCECTOMY FUSION  05/09/2008   C5-6, C6-7; with arthrodesis also   GASTRIC BYPASS  10/24/2000   KNEE ARTHROSCOPY  10/24/2002   rt and lt knee   ROTATOR CUFF REPAIR W/ DISTAL CLAVICLE EXCISION  01/06/2006   right   SHOULDER ARTHROSCOPY Left 10/24/2001   TONSILLECTOMY     TOTAL KNEE ARTHROPLASTY  03/29/2004   left   TOTAL KNEE ARTHROPLASTY  11/07/2003   right   TOTAL KNEE REVISION  07/30/2010   right; with lateral release   UPPER GASTROINTESTINAL ENDOSCOPY     No Known Allergies       04/10/2024   12:24 PM 03/11/2024   11:41 AM 12/28/2023   12:25 PM  Depression screen PHQ 2/9  Decreased Interest 1 1 0  Down, Depressed, Hopeless 1 1 0  PHQ - 2 Score 2 2 0  Altered sleeping 1 1 0  Tired, decreased energy 2 3 0  Change in appetite  0 1 0  Feeling bad or failure about yourself  0 1 0  Trouble concentrating 1 1 0  Moving slowly or fidgety/restless 0 0 0  Suicidal thoughts 0 0 0  PHQ-9 Score 6 9 0  Difficult doing work/chores Somewhat difficult Somewhat difficult Not difficult at all       04/10/2024   12:24 PM 03/11/2024   11:41 AM 12/28/2023   12:25 PM  GAD 7 : Generalized Anxiety Score  Nervous, Anxious, on Edge 1 0 0  Control/stop worrying 0 0 0  Worry too much - different things 1 1 0  Trouble relaxing 1 0 0  Restless 1 0 0  Easily annoyed or irritable 1 0 0  Afraid - awful might happen 0 0 0  Total GAD 7 Score 5 1 0  Anxiety Difficulty Not difficult at all Not difficult at all Not difficult at all      Review of Systems  Constitutional:  Negative for chills and fever.   Respiratory:  Negative for shortness of breath.   Cardiovascular:  Negative for chest pain.  Gastrointestinal:  Negative for abdominal pain, constipation, diarrhea, heartburn, nausea and vomiting.  Genitourinary:  Negative for dysuria, frequency and urgency.  Neurological:  Negative for dizziness and headaches.  Endo/Heme/Allergies:  Negative for polydipsia.  Psychiatric/Behavioral:  Positive for depression. Negative for suicidal ideas. The patient is not nervous/anxious.       Objective:     BP 122/82   Pulse 79   Temp 98.3 F (36.8 C) (Oral)   Ht 5' 6 (1.676 m)   Wt 244 lb (110.7 kg)   SpO2 93%   BMI 39.38 kg/m  BP Readings from Last 3 Encounters:  04/10/24 122/82  03/11/24 116/80  12/28/23 130/86   Wt Readings from Last 3 Encounters:  04/10/24 244 lb (110.7 kg)  03/11/24 244 lb (110.7 kg)  12/28/23 247 lb (112 kg)      Physical Exam Vitals and nursing note reviewed.  Constitutional:      Appearance: Normal appearance.   Cardiovascular:     Rate and Rhythm: Normal rate and regular rhythm.     Pulses: Normal pulses.     Heart sounds: Normal heart sounds.  Pulmonary:     Effort: Pulmonary effort is normal.     Breath sounds: Normal breath sounds.   Neurological:     Mental Status: She is alert and oriented to person, place, and time.   Psychiatric:        Mood and Affect: Mood normal.        Behavior: Behavior normal.        Thought Content: Thought content normal.        Judgment: Judgment normal.      No results found for any visits on 04/10/24.     The 10-year ASCVD risk score (Arnett DK, et al., 2019) is: 6.2%    Assessment & Plan:  Depressive disorder Assessment & Plan: Slightly better.  Denies si/hi.  Declines therapy referral.   Discussed treatment options.  Agreeable to increase Cymbalta  at this time.  Start Cymbalta  120 mg once daily. F/u in 8 weeks.  Orders: -     DULoxetine  HCl; Take 2 capsules (120 mg total) by mouth daily.   Dispense: 180 capsule; Refill: 0  Class 2 severe obesity due to excess calories with serious comorbidity and body mass index (BMI) of 39.0 to 39.9 in adult [facility]) Assessment & Plan: Discussed the importance of healthy diet  and exercise to affect sustainable weight loss.       Return in about 7 weeks (around 05/29/2024) for depression.    Jolanda Nation, NP

## 2024-04-11 ENCOUNTER — Ambulatory Visit: Admitting: Occupational Therapy

## 2024-04-11 ENCOUNTER — Encounter: Payer: Self-pay | Admitting: Occupational Therapy

## 2024-04-11 ENCOUNTER — Other Ambulatory Visit (HOSPITAL_COMMUNITY): Payer: Self-pay

## 2024-04-11 DIAGNOSIS — I890 Lymphedema, not elsewhere classified: Secondary | ICD-10-CM

## 2024-04-11 NOTE — Therapy (Signed)
 OUTPATIENT OCCUPATIONAL THERAPY TREATMENT NOTE   BILATERAL LOWER EXTREMITY LYMPHEDEMA Patient Name: [name] MRN: [id number] DOB:Nov 26, 1955, 68 y.o., female Today's Date: 04/11/2024  REPORTING PERIOD:   END OF SESSION:   OT End of Session - 04/11/24 1601     Visit Number 33    Number of Visits 36    Date for OT Re-Evaluation 06/23/24    OT Start Time 0306    OT Stop Time 0401    OT Time Calculation (min) 55 min    Activity Tolerance Patient tolerated treatment well;No increased pain    Behavior During Therapy WFL for tasks assessed/performed          Past Medical History:  Diagnosis Date   Allergy    Anemia    Anxiety    Arthritis    Chronic venous insufficiency 12/04/2015   Depression    Fibromyalgia    History of chicken pox    HTN (hypertension)    MRSA (methicillin resistant Staphylococcus aureus)    Obese    Plantar fasciitis    Pneumonia    Seasonal allergies    Sleep apnea 2012   took test in [place]-told to use cpap-could not ude   Past Surgical History:  Procedure Laterality Date   ANTERIOR CERVICAL DECOMP/DISCECTOMY FUSION  05/09/2008   C5-6, C6-7; with arthrodesis also   GASTRIC BYPASS  10/24/2000   KNEE ARTHROSCOPY  10/24/2002   rt and lt knee   ROTATOR CUFF REPAIR W/ DISTAL CLAVICLE EXCISION  01/06/2006   right   SHOULDER ARTHROSCOPY Left 10/24/2001   TONSILLECTOMY     TOTAL KNEE ARTHROPLASTY  03/29/2004   left   TOTAL KNEE ARTHROPLASTY  11/07/2003   right   TOTAL KNEE REVISION  07/30/2010   right; with lateral release   UPPER GASTROINTESTINAL ENDOSCOPY     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 I don't know when it started.  + family hx of leg swelling-mother  SUBJECTIVE:  SUBJECTIVE STATEMENT: [name] returns to OT to continue Intensive Phase CDT to LLE. Pt presents with compression wraps in place. Pt reports LE related leg pain as 3/10. Pt's spouse attends session today. Pt reports she heard from referring provider's office about garment script today. She has not had recent contact with DME provider.OT updated Pt on text conversation with provider's office re needed script for garments. Pt is discouraged because garments have not been ordered.  PAIN:  Are you having LE related pain? No: NPRS scale: 0/10 Pain location: feet Pain description: burning Aggravating factors: walking Relieving factors: elevation  PRECAUTIONS: Other: LYMPHEDEMA PRECAUTIONS  WEIGHT BEARING RESTRICTIONS: No  FALLS:  Has patient fallen in last 6 months? 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   OBJECTIVE: Note: Objective measures were completed at Evaluation unless otherwise noted.  COGNITION:  Overall cognitive status: frequent redirection needed during eval to stay on topic   POSTURE: WFL  LE ROM: grossly WFL  STRENGTH: grossly WFL   BLE COMPARATIVE LIMB VOLUMETRICS INITIAL 11/01/23  LANDMARK RIGHT  (dominant)  R LEG (A-D) 4296.6 ml  R THIGH (E-G) ml  R FULL LIMB (A-G) ml  Limb Volume differential (LVD)  %  Volume change since initial %  Volume change overall V  (Blank rows = not tested)  LANDMARK LEFT    L LEG (A-D) 4829.1 ml  L  THIGH (E-G) ml  L FULL LIMB (A-G) ml  Limb Volume differential (LVD)   12.4%, L>R  Volume change since initial %  Volume change overall %  (Blank rows = not tested)    9th Visit 12/06/23        LANDMARK LEFT    L LEG (A-D) 4888.8 ml  L  THIGH (E-G) ml  L FULL LIMB (A-G) ml  Limb Volume differential (LVD)     Volume change since initial  + 1.2 %  Volume change overall %  (Blank rows = not tested)    15 th Visit 01/10/24       LANDMARK LEFT    L LEG (A-D) 4637.8 ml  L  THIGH (E-G) ml  L FULL LIMB (A-G) ml  Limb Volume differential (LVD)     Volume change since last measured 12/06/23  DECREASED by 5.14%  Volume change overall %  (Blank rows = not tested)   20 th Visit- BLE COMPARATIVE LIMB VOLUMETRICS  02/01/24   LANDMARK LEFT    L LEG (A-D) 4139.2 ml  L  THIGH (E-G) ml  L FULL LIMB (A-G) ml  Limb Volume differential (LVD)     Volume change since initial DECREASED 10.8 %  Volume change overall %  (Blank rows = not tested)    30 th Visit- BLE COMPARATIVE LIMB VOLUMETRICS  03/25/24   LANDMARK LEFT    L LEG (A-D) 4173.5 ml  L  THIGH (E-G) ml  L FULL LIMB (A-G)  ml  Limb Volume differential (LVD)     Volume change since LAST MEASURED ON 02/01/24  Inc BY 0.8 %  Volume change overall %  (Blank rows = not tested)   Mild, Stage  II, 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 Obesity  Skin  Description Hyper-Keratosis Peau' de Orange Shiny Tight Fibrotic/ Indurated Fatty Doughy Spongy/ boggy   x    Bilateral Chronic Lipo dermatosclerosis Hardened, thickened, leathery      Skin dry Flaky WNL Macerated   mildly x     Color Redness Varicosities Blanching Hemosiderin Stain Mottled   x x x  Severe bilaterally x   Odor Malodorous Yeast Fungal infection  WNL      x   Temperature Warm Cool wnl    x     Pitting Edema   1+ 2+ 3+ 4+ Non-pitting         x   Girth Symmetrical Asymmetrical                   Distribution    L>R toes to popliteal     Stemmer Sign Positive Negative   +    Lymphorrhea History Of:  Present Absent     x    Wounds History Of Present Absent Venous Arterial Pressure Sheer     x        Signs of Infection Redness Warmth Erythema Acute Swelling Drainage Borders                    Sensation Light Touch Deep pressure Hypersensitivity Neuropathic pain   In Tact Impaired In tact Impaired Absent Impaired Plantar surfaces bilaterally   x  x  x      Nails WNL   Fungus nail dystrophy     x   Hair Growth Symmetrical Asymmetrical   x    Skin Creases Base of toes  Ankles   Base of Fingers knees       Abdominal pannus Thigh Lobules  Face/neck   x           TODAY'S TREATMENT:                                                                                                                                         LLE/LLQ MLD w Simultaneous skin L LEG multilayer gradient compression wraps  PATIENT EDUCATION:  Continued Pt/ CG edu for lymphedema self care home program throughout session. Topics include outcome of comparative limb volumetrics- starting limb volume differentials (LVDs), technology and  gradient techniques used for short stretch, multilayer compression wrapping, simple self-MLD, therapeutic lymphatic pumping exercises, skin/nail care, LE precautions,. compression garment recommendations and specifications, wear and care schedule and compression garment donning / doffing w assistive devices. Discussed progress towards all OT goals since commencing CDT. All questions answered to the Pt's satisfaction. Good return. Person educated: Patient  Education method: Explanation, Demonstration, and Handouts Education comprehension: verbalized understanding, returned demonstration, verbal cues required, and needs further  education  HOME EXERCISE PROGRAM: BLE lymphatic pumping there ex using- 1 sets of 10 reps, each exercise in order-  1-2 x daily, bilaterally Simple self MLD 1 x daily Daily skin care to increase hydration, skin mobility and decrease infection risk- can be done during MLD Compression wraps 23/7 until garment fitting complete   ASSESSMENT  CLINICAL IMPRESSION:   Continued LLE MLD as established. Provided simultaneous skin care, as established. As usual manual therapy is well tolerated without increased pain. L Limb volume is well reduced with very little excess lymphatic fluid remaining in tissues.  Continued  knee length compression wrapping as established.DME vendor still awaiting return of signed script from referring physician. Discussed requesting script from PCP as alternative. If script has not been received by DME vendor and garments ordered by next week we'll fit Pt with Velcro style garment alternatives ASAP to end wrapping . These are not ideal but Pt does not fit into off the shelf stockings, and Velcro style garment alternatives are adjustable. Cont as per POC.  (Initial Eval 09/13/23: Emil Klassen is a 68 y.o. female presenting with mild, BLE lymphedema 2.2 CVI and Obesity which contributes to and is affected by multiple co morbidities , including arthritis,  fibromyalgia, HTN, obesity, plantar fasciitis and sleep apnea. 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 Pt denies hx of known, chronic limb swelling in her family. She does not utilize compression garments, or a vasopneumatic compression pump as conservative measures. Chronic limb swelling and associated pain limits functional performance in all occupational domains, including functional ambulation and mobility, self-care and basic and instrumental ADLs. It limits participation in leisure pursuits, productive activities and social participle[ation.   Pt will benefit from  skilled Occupational Therapy for both  Intensive and Self -management phases of Complete Decongestive Therapy (CDT) to reduce limb swelling and associated pain, to limit progression, to reduce infection risk , and to increase functional performance, social participation and quality of life. CDT include manual lymphatic drainage (MLD), skin care, therapeutic exercise and compression therapies- multilayer bandaging initially, one limb at a time,  and then appropriate compression garments, or alternatives in the final phase. Pt and caregivers who assist are trained to perform a;; aspects of Lymphedema self-care top ensure optimal self management over time. Without skilled  OT for CDT, further progression of lymphedema is certain and further functional decline is expected.)   OBJECTIVE IMPAIRMENTS: decreased activity tolerance, decreased knowledge of condition, decreased knowledge of use of DME, increased edema, pain, and chronic leg swelling.   ACTIVITY LIMITATIONS: limited functional ambulation and functional mobility 2/2 lymphedema -related foot and leg pain; standing, squatting, stairs, bathing, toileting, dressing, and hygiene/grooming  PARTICIPATION LIMITATIONS: meal prep, cooking, cleaning, driving, shopping, community activity, and volunteering  PERSONAL FACTORS: Time since onset of  injury/illness/exacerbation are also affecting patient's functional outcome.   REHAB POTENTIAL: Good  CLINICAL DECISION MAKING: Evolving/moderate complexity  EVALUATION COMPLEXITY: Moderate   GOALS: Goals reviewed with patient? Yes  SHORT TERM GOALS: Target date: 4th OT Rx visit   Pt will demonstrate understanding of lymphedema precautions and prevention strategies with modified independence using a printed reference to identify at least 5 precautions and discussing how s/he may implement them into daily life to reduce risk of progression with extra time. Baseline:Max A Goal status: 11/20/23 GOAL MET  2.  Pt will be able to apply multilayer, knee length, gradient, compression wraps to one leg at a time with max caregiver assistance to decrease limb  volume, to limit infection risk, and to limit lymphedema progression.  Baseline: Dependent Goal status: 11/13/23 GOAL MET  LONG TERM GOALS: Target date: 12/12/22  Given this patient's Intake score of TBD % on the functional outcomes FOTO tool, patient will experience an increase in function of 5 points to improve basic and instrumental ADLs performance, including lymphedema self-care.  Baseline: Max A Goal status: GOAL DEFERRED. FOTO tool discontinued.  2.  Given this patient's Intake score of 64.71 on the Lymphedema Life Impact Scale (LLIS), patient will experience a reduction of at least 5 points in her perceived level of functional impairment resulting from lymphedema to improve functional performance and quality of life (QOL). Baseline: 64.71% Goal status: PROGRESSING  3.  Pt will achieve at least a 10% volume reduction in B legs to return limb to typical size and shape, to limit infection risk and LE progression, to decrease pain, to improve function. Baseline: Dependent Goal status: PARTIALLY MET:  FOR L LEG with 10.8% REDUCTION.   4.  Pt will obtain proper compression garments/devices and achieve modified independence (extra time +  assistive devices) with donning/doffing to optimize limb volume reductions and limit LE progression over time. Baseline:  Goal status: PROGRESSING. OT ordered LLE custom garment on 02/07/24. Process has been delayed by MD not returning signed script to DME vendor. PLAN:  OT FREQUENCY: 2x/week  OT DURATION: 12 weeks  PLANNED INTERVENTIONS: 97110-Therapeutic exercises, 97530- Therapeutic activity, 97535- Self Care, 13244- Manual therapy, Patient/Family education, Manual lymph drainage, Compression bandaging, DME instructions, and fitting with compression garments  Custom-made gradient compression garments and HOS devices are medically necessary because they are uniquely sized and shaped to fit the exact dimensions of the affected extremities, and to provide appropriate medical grade, graduated compression essential for optimally managing chronic, progressive lymphedema. Multiple custom compression garments are needed to ensure proper hygiene to limit infection risk. Custom compression garments should be replaced q 3-6 months When worn consistently for optimal lipo-lymphedema self-management over time. HOS devices, medically necessary to limit fibrosis buildup in tissue, should be replaced q 2 years and PRN when worn out.     Pt should be fit:  3 Left and 3 Right, custom, Jobst, flat knit, Elvarex Classic, ccl 2 or 3, knee length compression stockings with open toes to limit and control chronic leg swelling, to improve lymphatic circulation, to reduce infection risk, to limit formation of tissue fibrosis and to limit progression of lymphedema over time. Custom compression garments should be replaced q 3-6 months when worn consistently for optimal lymphedema self-management.   Pt should be fit with 1 R and 1 L custom, Jobst Relax convoluted Hours-of-Sleep (HOS)  compression device to counteract fluid accumulation and fibrosis formation leading to lymphedema progression. at night. HOS devices, medically  necessary to limit fibrosis buildup in tissue, should be replaced q 2 years and PRN when worn out.    PLAN FOR NEXT SESSION:  LLE MLD as established using functional inguinal LN and deep abdominal lymphatics. Multilayer compression bandaging to L leg using 1 each 8, 10 and 12 cm wide x 5 meters long short stretch wrap over single layer of rosidal foam and cotton stockinett using gradient techniques. Pt instructed to remove wraps and report symptoms to her doctor if she experiences acute pain in the limb, signs/symptoms of infection, or atypical sob.  Pt / family edu for LE self care: simple self MLD leg sequence  Arnold Bicker, MS, OTR/L, CLT-LANA 04/11/24 4:03 PM

## 2024-04-15 ENCOUNTER — Other Ambulatory Visit: Payer: Self-pay | Admitting: General Practice

## 2024-04-15 ENCOUNTER — Ambulatory Visit: Admitting: Occupational Therapy

## 2024-04-15 DIAGNOSIS — I10 Essential (primary) hypertension: Secondary | ICD-10-CM

## 2024-04-15 DIAGNOSIS — I890 Lymphedema, not elsewhere classified: Secondary | ICD-10-CM

## 2024-04-15 NOTE — Therapy (Signed)
 OUTPATIENT OCCUPATIONAL THERAPY TREATMENT NOTE    BILATERAL LOWER EXTREMITY LYMPHEDEMA Patient Name: [name] MRN: [id number] DOB:August 19, 1956, 68 y.o., female Today's Date: 04/15/2024  REPORTING PERIOD:   END OF SESSION:   OT End of Session - 04/15/24 1607     Visit Number 34    Number of Visits 36    Date for OT Re-Evaluation 06/23/24    OT Start Time 0306    OT Stop Time 0408    OT Time Calculation (min) 62 min    Activity Tolerance Patient tolerated treatment well;No increased pain    Behavior During Therapy WFL for tasks assessed/performed          Past Medical History:  Diagnosis Date   Allergy    Anemia    Anxiety    Arthritis    Chronic venous insufficiency 12/04/2015   Depression    Fibromyalgia    History of chicken pox    HTN (hypertension)    MRSA (methicillin resistant Staphylococcus aureus)    Obese    Plantar fasciitis    Pneumonia    Seasonal allergies    Sleep apnea 2012   took test in [place]-told to use cpap-could not ude   Past Surgical History:  Procedure Laterality Date   ANTERIOR CERVICAL DECOMP/DISCECTOMY FUSION  05/09/2008   C5-6, C6-7; with arthrodesis also   GASTRIC BYPASS  10/24/2000   KNEE ARTHROSCOPY  10/24/2002   rt and lt knee   ROTATOR CUFF REPAIR W/ DISTAL CLAVICLE EXCISION  01/06/2006   right   SHOULDER ARTHROSCOPY Left 10/24/2001   TONSILLECTOMY     TOTAL KNEE ARTHROPLASTY  03/29/2004   left   TOTAL KNEE ARTHROPLASTY  11/07/2003   right   TOTAL KNEE REVISION  07/30/2010   right; with lateral release   UPPER GASTROINTESTINAL ENDOSCOPY     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 I don't know when it started.  + family hx of leg swelling-mother  SUBJECTIVE:  SUBJECTIVE STATEMENT: [name] returns to OT to continue Intensive Phase CDT to LLE. Pt presents with compression wraps in place. Pt reports LE related leg pain as 3/10.   PAIN:  Are you having LE related pain? No: NPRS scale: 3/10 Pain location: feet Pain description: burning Aggravating factors: walking Relieving factors: elevation  PRECAUTIONS: Other: LYMPHEDEMA PRECAUTIONS  WEIGHT BEARING RESTRICTIONS: No  FALLS:  Has patient fallen in last 6 months? 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   OBJECTIVE: Note: Objective measures were completed  at Evaluation unless otherwise noted.  COGNITION:  Overall cognitive status: frequent redirection needed during eval to stay on topic   POSTURE: WFL  LE ROM: grossly WFL  STRENGTH: grossly WFL   BLE COMPARATIVE LIMB VOLUMETRICS INITIAL 11/01/23  LANDMARK RIGHT  (dominant)  R LEG (A-D) 4296.6 ml  R THIGH (E-G) ml  R FULL LIMB (A-G) ml  Limb Volume differential (LVD)  %  Volume change since initial %  Volume change overall V  (Blank rows = not tested)  LANDMARK LEFT    L LEG (A-D) 4829.1 ml  L  THIGH (E-G) ml  L FULL LIMB (A-G) ml  Limb Volume differential (LVD)   12.4%, L>R  Volume change since initial %  Volume change overall %  (Blank rows = not tested)    9th Visit 12/06/23        LANDMARK LEFT    L LEG (A-D) 4888.8 ml  L  THIGH (E-G) ml  L FULL LIMB (A-G) ml  Limb Volume differential (LVD)     Volume change since initial  + 1.2 %  Volume change overall %  (Blank rows = not tested)    15 th Visit 01/10/24       LANDMARK LEFT    L LEG (A-D) 4637.8 ml  L  THIGH (E-G) ml  L FULL LIMB (A-G) ml  Limb Volume differential (LVD)     Volume change since last measured 12/06/23  DECREASED by 5.14%  Volume change overall %  (Blank rows = not tested)   20 th Visit- BLE COMPARATIVE LIMB VOLUMETRICS  02/01/24   LANDMARK LEFT    L LEG (A-D) 4139.2 ml  L  THIGH (E-G) ml  L FULL LIMB (A-G) ml  Limb Volume differential (LVD)     Volume change since initial DECREASED 10.8 %  Volume change overall %  (Blank rows = not tested)    30 th Visit- BLE COMPARATIVE LIMB VOLUMETRICS  03/25/24   LANDMARK LEFT    L LEG (A-D) 4173.5 ml  L  THIGH (E-G) ml  L FULL LIMB (A-G) ml  Limb Volume differential (LVD)     Volume change since LAST MEASURED ON 02/01/24  Inc BY 0.8 %  Volume change overall %  (Blank rows = not tested)   Mild, Stage  II, 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 Obesity  Skin  Description Hyper-Keratosis Peau' de Orange Shiny Tight  Fibrotic/ Indurated Fatty Doughy Spongy/ boggy  x    Bilateral Chronic Lipo dermatosclerosis Hardened, thickened, leathery      Skin dry Flaky WNL Macerated   mildly x     Color Redness Varicosities Blanching Hemosiderin Stain Mottled   x x x  Severe bilaterally x   Odor Malodorous Yeast Fungal infection  WNL      x   Temperature Warm Cool wnl    x     Pitting Edema   1+ 2+ 3+ 4+ Non-pitting         x   Girth Symmetrical Asymmetrical                   Distribution    L>R toes to popliteal     Stemmer Sign Positive Negative   +    Lymphorrhea History Of:  Present Absent     x    Wounds History Of Present Absent Venous Arterial Pressure Sheer     x        Signs of Infection Redness Warmth Erythema Acute Swelling Drainage Borders                    Sensation Light Touch Deep pressure Hypersensitivity Neuropathic pain   In Tact Impaired In tact Impaired Absent Impaired Plantar surfaces bilaterally   x  x  x      Nails WNL   Fungus nail dystrophy     x   Hair Growth Symmetrical Asymmetrical   x    Skin Creases Base of toes  Ankles   Base of Fingers knees       Abdominal pannus Thigh Lobules  Face/neck   x           TODAY'S TREATMENT:                                                                                                                                         LLE/LLQ MLD w Simultaneous skin L LEG multilayer gradient compression wraps  PATIENT EDUCATION:  Continued Pt/ CG edu for lymphedema self care home program throughout session. Topics include outcome of comparative limb volumetrics- starting limb volume differentials (LVDs), technology and gradient techniques used for short stretch, multilayer compression wrapping, simple self-MLD, therapeutic lymphatic pumping exercises, skin/nail care, LE precautions,. compression garment recommendations and specifications, wear and care schedule and compression garment donning / doffing w assistive  devices. Discussed progress towards all OT goals since commencing CDT. All questions answered to the Pt's satisfaction. Good return. Person educated: Patient  Education method: Explanation, Demonstration, and Handouts Education comprehension: verbalized understanding, returned demonstration, verbal cues required, and needs further education  HOME EXERCISE PROGRAM: BLE lymphatic pumping there ex using- 1 sets of 10 reps, each exercise in order-  1-2 x daily, bilaterally Simple self MLD 1 x daily Daily skin care to increase hydration, skin mobility and decrease infection risk- can be done during  MLD Compression wraps 23/7 until garment fitting complete   ASSESSMENT  CLINICAL IMPRESSION:   Continued LLE MLD as established. Provided simultaneous skin care, as established. As usual manual therapy is well tolerated without increased pain. L Limb volume is well reduced with very little excess lymphatic fluid remaining in tissues.  Continued  knee length compression wrapping as established.DME vendor still awaiting return of signed script from referring physician. Discussed requesting script from PCP as alternative. If script has not been received by DME vendor and garments ordered by next week we'll fit Pt with Velcro style garment alternatives ASAP to end wrapping . These are not ideal but Pt does not fit into off the shelf stockings, and Velcro style garment alternatives are adjustable. Cont as per POC.  (Initial Eval 09/13/23: [name] is a 68 y.o. 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 Pt denies hx of known, chronic limb swelling in her family. She does not utilize compression garments, or a vasopneumatic compression pump as conservative  measures. Chronic limb swelling and associated pain limits functional performance in all occupational domains, including functional ambulation and mobility, self-care and basic and instrumental ADLs. It limits participation in leisure pursuits, productive activities and social participle[ation.   Pt will benefit from  skilled Occupational Therapy for both  Intensive and Self -management phases of Complete Decongestive Therapy (CDT) to reduce limb swelling and associated pain, to limit progression, to reduce infection risk , and to increase functional performance, social participation and quality of life. CDT include manual lymphatic drainage (MLD), skin care, therapeutic exercise and compression therapies- multilayer bandaging initially, one limb at a time,  and then appropriate compression garments, or alternatives in the final phase. Pt and caregivers who assist are trained to perform a;; aspects of Lymphedema self-care top ensure optimal self management over time. Without skilled  OT for CDT, further progression of lymphedema is certain and further functional decline is expected.)   OBJECTIVE IMPAIRMENTS: decreased activity tolerance, decreased knowledge of condition, decreased knowledge of use of DME, increased edema, pain, and chronic leg swelling.   ACTIVITY LIMITATIONS: limited functional ambulation and functional mobility 2/2 lymphedema -related foot and leg pain; standing, squatting, stairs, bathing, toileting, dressing, and hygiene/grooming  PARTICIPATION LIMITATIONS: meal prep, cooking, cleaning, driving, shopping, community activity, and volunteering  PERSONAL FACTORS: Time since onset of injury/illness/exacerbation are also affecting patient's functional outcome.   REHAB POTENTIAL: Good  CLINICAL DECISION MAKING: Evolving/moderate complexity  EVALUATION COMPLEXITY: Moderate   GOALS: Goals reviewed with patient? Yes  SHORT TERM GOALS: Target date: 4th OT Rx visit   Pt will  demonstrate understanding of lymphedema precautions and prevention strategies with modified independence using a printed reference to identify at least 5 precautions and discussing how s/he may implement them into daily life to reduce risk of progression with extra time. Baseline:Max A Goal status: 11/20/23 GOAL MET  2.  Pt will be able to apply multilayer, knee length, gradient, compression wraps to one leg at a time with max caregiver assistance to decrease limb volume, to limit infection risk, and to limit lymphedema progression.  Baseline: Dependent Goal status: 11/13/23 GOAL MET  LONG TERM GOALS: Target date: 12/12/22  Given this patient's Intake score of TBD % on the functional outcomes FOTO tool, patient will experience an increase in function  of 5 points to improve basic and instrumental ADLs performance, including lymphedema self-care.  Baseline: Max A Goal status: GOAL DEFERRED. FOTO tool discontinued.  2.  Given this patient's Intake score of 64.71 on the Lymphedema Life Impact Scale (LLIS), patient will experience a reduction of at least 5 points in her perceived level of functional impairment resulting from lymphedema to improve functional performance and quality of life (QOL). Baseline: 64.71% Goal status: PROGRESSING  3.  Pt will achieve at least a 10% volume reduction in B legs to return limb to typical size and shape, to limit infection risk and LE progression, to decrease pain, to improve function. Baseline: Dependent Goal status: PARTIALLY MET:  FOR L LEG with 10.8% REDUCTION.   4.  Pt will obtain proper compression garments/devices and achieve modified independence (extra time + assistive devices) with donning/doffing to optimize limb volume reductions and limit LE progression over time. Baseline:  Goal status: PROGRESSING. OT ordered LLE custom garment on 02/07/24. Process has been delayed by MD not returning signed script to DME vendor. PLAN:  OT FREQUENCY: 2x/week  OT  DURATION: 12 weeks  PLANNED INTERVENTIONS: 97110-Therapeutic exercises, 97530- Therapeutic activity, 97535- Self Care, [id number]- Manual therapy, Patient/Family education, Manual lymph drainage, Compression bandaging, DME instructions, and fitting with compression garments  Custom-made gradient compression garments and HOS devices are medically necessary because they are uniquely sized and shaped to fit the exact dimensions of the affected extremities, and to provide appropriate medical grade, graduated compression essential for optimally managing chronic, progressive lymphedema. Multiple custom compression garments are needed to ensure proper hygiene to limit infection risk. Custom compression garments should be replaced q 3-6 months When worn consistently for optimal lipo-lymphedema self-management over time. HOS devices, medically necessary to limit fibrosis buildup in tissue, should be replaced q 2 years and PRN when worn out.     Pt should be fit:  3 Left and 3 Right, custom, Jobst, flat knit, Elvarex Classic, ccl 2 or 3, knee length compression stockings with open toes to limit and control chronic leg swelling, to improve lymphatic circulation, to reduce infection risk, to limit formation of tissue fibrosis and to limit progression of lymphedema over time. Custom compression garments should be replaced q 3-6 months when worn consistently for optimal lymphedema self-management.   Pt should be fit with 1 R and 1 L custom, Jobst Relax convoluted Hours-of-Sleep (HOS)  compression device to counteract fluid accumulation and fibrosis formation leading to lymphedema progression. at night. HOS devices, medically necessary to limit fibrosis buildup in tissue, should be replaced q 2 years and PRN when worn out.    PLAN FOR NEXT SESSION:  LLE MLD as established using functional inguinal LN and deep abdominal lymphatics. Multilayer compression bandaging to L leg using 1 each 8, 10 and 12 cm wide x 5 meters long  short stretch wrap over single layer of rosidal foam and cotton stockinett using gradient techniques. Pt instructed to remove wraps and report symptoms to her doctor if she experiences acute pain in the limb, signs/symptoms of infection, or atypical sob.  Pt / family edu for LE self care: simple self MLD leg sequence  Zebedee Dec, MS, OTR/L, CLT-LANA 04/15/24 4:09 PM

## 2024-04-18 ENCOUNTER — Encounter: Payer: Self-pay | Admitting: Occupational Therapy

## 2024-04-18 ENCOUNTER — Ambulatory Visit: Admitting: Occupational Therapy

## 2024-04-18 DIAGNOSIS — I890 Lymphedema, not elsewhere classified: Secondary | ICD-10-CM | POA: Diagnosis not present

## 2024-04-18 NOTE — Therapy (Signed)
 OUTPATIENT OCCUPATIONAL THERAPY TREATMENT NOTE    BILATERAL LOWER EXTREMITY LYMPHEDEMA Patient Name: [name] MRN: [id number] DOB:1956-06-03, 68 y.o., female Today's Date: 04/18/2024  REPORTING PERIOD:   END OF SESSION:   OT End of Session - 04/18/24 1421     Visit Number 35    Number of Visits 36    Date for OT Re-Evaluation 06/23/24    OT Start Time 0210    OT Stop Time 0310    OT Time Calculation (min) 60 min    Activity Tolerance Patient tolerated treatment well;No increased pain    Behavior During Therapy WFL for tasks assessed/performed          Past Medical History:  Diagnosis Date   Allergy    Anemia    Anxiety    Arthritis    Chronic venous insufficiency 12/04/2015   Depression    Fibromyalgia    History of chicken pox    HTN (hypertension)    MRSA (methicillin resistant Staphylococcus aureus)    Obese    Plantar fasciitis    Pneumonia    Seasonal allergies    Sleep apnea 2012   took test in [place]-told to use cpap-could not ude   Past Surgical History:  Procedure Laterality Date   ANTERIOR CERVICAL DECOMP/DISCECTOMY FUSION  05/09/2008   C5-6, C6-7; with arthrodesis also   GASTRIC BYPASS  10/24/2000   KNEE ARTHROSCOPY  10/24/2002   rt and lt knee   ROTATOR CUFF REPAIR W/ DISTAL CLAVICLE EXCISION  01/06/2006   right   SHOULDER ARTHROSCOPY Left 10/24/2001   TONSILLECTOMY     TOTAL KNEE ARTHROPLASTY  03/29/2004   left   TOTAL KNEE ARTHROPLASTY  11/07/2003   right   TOTAL KNEE REVISION  07/30/2010   right; with lateral release   UPPER GASTROINTESTINAL ENDOSCOPY     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 I don't know when it started.  + family hx of leg swelling-mother  SUBJECTIVE:  SUBJECTIVE STATEMENT: MATILYN FEHRMAN returns to OT to continue Intensive Phase CDT to LLE. Pt presents with compression wraps in place. Pt reports LE related leg pain as 3/10.   PAIN:  Are you having LE related pain? No: NPRS scale: 3/10 Pain location: feet Pain description: burning Aggravating factors: walking Relieving factors: elevation  PRECAUTIONS: Other: LYMPHEDEMA PRECAUTIONS  WEIGHT BEARING RESTRICTIONS: No  FALLS:  Has patient fallen in last 6 months? 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   OBJECTIVE: Note: Objective measures were completed  at Evaluation unless otherwise noted.  COGNITION:  Overall cognitive status: frequent redirection needed during eval to stay on topic   POSTURE: WFL  LE ROM: grossly WFL  STRENGTH: grossly WFL   BLE COMPARATIVE LIMB VOLUMETRICS INITIAL 11/01/23  LANDMARK RIGHT  (dominant)  R LEG (A-D) 4296.6 ml  R THIGH (E-G) ml  R FULL LIMB (A-G) ml  Limb Volume differential (LVD)  %  Volume change since initial %  Volume change overall V  (Blank rows = not tested)  LANDMARK LEFT    L LEG (A-D) 4829.1 ml  L  THIGH (E-G) ml  L FULL LIMB (A-G) ml  Limb Volume differential (LVD)   12.4%, L>R  Volume change since initial %  Volume change overall %  (Blank rows = not tested)    9th Visit 12/06/23        LANDMARK LEFT    L LEG (A-D) 4888.8 ml  L  THIGH (E-G) ml  L FULL LIMB (A-G) ml  Limb Volume differential (LVD)     Volume change since initial  + 1.2 %  Volume change overall %  (Blank rows = not tested)    15 th Visit 01/10/24       LANDMARK LEFT    L LEG (A-D) 4637.8 ml  L  THIGH (E-G) ml  L FULL LIMB (A-G) ml  Limb Volume differential (LVD)     Volume change since last measured 12/06/23  DECREASED by 5.14%  Volume change overall %  (Blank rows = not tested)   20 th Visit- BLE COMPARATIVE LIMB VOLUMETRICS  02/01/24   LANDMARK LEFT    L LEG (A-D) 4139.2 ml  L  THIGH (E-G) ml  L FULL LIMB (A-G) ml  Limb Volume differential (LVD)     Volume change since initial DECREASED 10.8 %  Volume change overall %  (Blank rows = not tested)    30 th Visit- BLE COMPARATIVE LIMB VOLUMETRICS  03/25/24   LANDMARK LEFT    L LEG (A-D) 4173.5 ml  L  THIGH (E-G) ml  L FULL LIMB (A-G) ml  Limb Volume differential (LVD)     Volume change since LAST MEASURED ON 02/01/24  Inc BY 0.8 %  Volume change overall %  (Blank rows = not tested)   Mild, Stage  II, 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 Obesity  Skin  Description Hyper-Keratosis Peau' de Orange Shiny Tight  Fibrotic/ Indurated Fatty Doughy Spongy/ boggy  x    Bilateral Chronic Lipo dermatosclerosis Hardened, thickened, leathery      Skin dry Flaky WNL Macerated   mildly x     Color Redness Varicosities Blanching Hemosiderin Stain Mottled   x x x  Severe bilaterally x   Odor Malodorous Yeast Fungal infection  WNL      x   Temperature Warm Cool wnl    x     Pitting Edema   1+ 2+ 3+ 4+ Non-pitting         x   Girth Symmetrical Asymmetrical                   Distribution    L>R toes to popliteal     Stemmer Sign Positive Negative   +    Lymphorrhea History Of:  Present Absent     x    Wounds History Of Present Absent Venous Arterial Pressure Sheer     x        Signs of Infection Redness Warmth Erythema Acute Swelling Drainage Borders                    Sensation Light Touch Deep pressure Hypersensitivity Neuropathic pain   In Tact Impaired In tact Impaired Absent Impaired Plantar surfaces bilaterally   x  x  x      Nails WNL   Fungus nail dystrophy     x   Hair Growth Symmetrical Asymmetrical   x    Skin Creases Base of toes  Ankles   Base of Fingers knees       Abdominal pannus Thigh Lobules  Face/neck   x           TODAY'S TREATMENT:                                                                                                                                         LLE/LLQ MLD w Simultaneous skin L LEG multilayer gradient compression wraps  PATIENT EDUCATION:  Continued Pt/ CG edu for lymphedema self care home program throughout session. Topics include outcome of comparative limb volumetrics- starting limb volume differentials (LVDs), technology and gradient techniques used for short stretch, multilayer compression wrapping, simple self-MLD, therapeutic lymphatic pumping exercises, skin/nail care, LE precautions,. compression garment recommendations and specifications, wear and care schedule and compression garment donning / doffing w assistive  devices. Discussed progress towards all OT goals since commencing CDT. All questions answered to the Pt's satisfaction. Good return. Person educated: Patient  Education method: Explanation, Demonstration, and Handouts Education comprehension: verbalized understanding, returned demonstration, verbal cues required, and needs further education  HOME EXERCISE PROGRAM: BLE lymphatic pumping there ex using- 1 sets of 10 reps, each exercise in order-  1-2 x daily, bilaterally Simple self MLD 1 x daily Daily skin care to increase hydration, skin mobility and decrease infection risk- can be done during  MLD Compression wraps 23/7 until garment fitting complete   ASSESSMENT  CLINICAL IMPRESSION:   DME vendor emailed Pt that her garment info has been submitted to her insurance company to get benefits information. OT emailed DME vendor and told her Pt    does not use email, and requested she call the Pt, who has left multiple VM requesting call back. Continued LLE MLD as established. Provided simultaneous skin care, as established. As usual manual therapy is well tolerated without increased pain. L Limb volume is well reduced with very little excess lymphatic fluid remaining in tissues.  Continued  knee length compression wrapping as established. Cont as per POC.  (Initial Eval 09/13/23: Janavia Rottman is a 68 y.o. 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 Pt denies hx of known, chronic limb swelling in her family. She does not utilize compression garments, or a vasopneumatic compression pump as conservative measures. Chronic limb swelling and associated pain limits functional performance in all occupational domains, including functional ambulation and mobility,  self-care and basic and instrumental ADLs. It limits participation in leisure pursuits, productive activities and social participle[ation.   Pt will benefit from  skilled Occupational Therapy for both  Intensive and Self -management phases of Complete Decongestive Therapy (CDT) to reduce limb swelling and associated pain, to limit progression, to reduce infection risk , and to increase functional performance, social participation and quality of life. CDT include manual lymphatic drainage (MLD), skin care, therapeutic exercise and compression therapies- multilayer bandaging initially, one limb at a time,  and then appropriate compression garments, or alternatives in the final phase. Pt and caregivers who assist are trained to perform a;; aspects of Lymphedema self-care top ensure optimal self management over time. Without skilled  OT for CDT, further progression of lymphedema is certain and further functional decline is expected.)   OBJECTIVE IMPAIRMENTS: decreased activity tolerance, decreased knowledge of condition, decreased knowledge of use of DME, increased edema, pain, and chronic leg swelling.   ACTIVITY LIMITATIONS: limited functional ambulation and functional mobility 2/2 lymphedema -related foot and leg pain; standing, squatting, stairs, bathing, toileting, dressing, and hygiene/grooming  PARTICIPATION LIMITATIONS: meal prep, cooking, cleaning, driving, shopping, community activity, and volunteering  PERSONAL FACTORS: Time since onset of injury/illness/exacerbation are also affecting patient's functional outcome.   REHAB POTENTIAL: Good  CLINICAL DECISION MAKING: Evolving/moderate complexity  EVALUATION COMPLEXITY: Moderate   GOALS: Goals reviewed with patient? Yes  SHORT TERM GOALS: Target date: 4th OT Rx visit   Pt will demonstrate understanding of lymphedema precautions and prevention strategies with modified independence using a printed reference to identify at least 5  precautions and discussing how s/he may implement them into daily life to reduce risk of progression with extra time. Baseline:Max A Goal status: 11/20/23 GOAL MET  2.  Pt will be able to apply multilayer, knee length, gradient, compression wraps to one leg at a time with max caregiver assistance to decrease limb volume, to limit infection risk, and to limit lymphedema progression.  Baseline: Dependent Goal status: 11/13/23 GOAL MET  LONG TERM GOALS: Target date: 12/12/22  Given this patient's Intake score of TBD % on the functional outcomes FOTO tool, patient will experience an increase in function of 5 points to improve basic and instrumental ADLs performance, including lymphedema self-care.  Baseline: Max A Goal  status: GOAL DEFERRED. FOTO tool discontinued.  2.  Given this patient's Intake score of 64.71 on the Lymphedema Life Impact Scale (LLIS), patient will experience a reduction of at least 5 points in her perceived level of functional impairment resulting from lymphedema to improve functional performance and quality of life (QOL). Baseline: 64.71% Goal status: PROGRESSING  3.  Pt will achieve at least a 10% volume reduction in B legs to return limb to typical size and shape, to limit infection risk and LE progression, to decrease pain, to improve function. Baseline: Dependent Goal status: PARTIALLY MET:  FOR L LEG with 10.8% REDUCTION.   4.  Pt will obtain proper compression garments/devices and achieve modified independence (extra time + assistive devices) with donning/doffing to optimize limb volume reductions and limit LE progression over time. Baseline:  Goal status: PROGRESSING. OT ordered LLE custom garment on 02/07/24. Process has been delayed by MD not returning signed script to DME vendor. PLAN:  OT FREQUENCY: 2x/week  OT DURATION: 12 weeks  PLANNED INTERVENTIONS: 97110-Therapeutic exercises, 97530- Therapeutic activity, 97535- Self Care, [id number]- Manual therapy,  Patient/Family education, Manual lymph drainage, Compression bandaging, DME instructions, and fitting with compression garments  Custom-made gradient compression garments and HOS devices are medically necessary because they are uniquely sized and shaped to fit the exact dimensions of the affected extremities, and to provide appropriate medical grade, graduated compression essential for optimally managing chronic, progressive lymphedema. Multiple custom compression garments are needed to ensure proper hygiene to limit infection risk. Custom compression garments should be replaced q 3-6 months When worn consistently for optimal lipo-lymphedema self-management over time. HOS devices, medically necessary to limit fibrosis buildup in tissue, should be replaced q 2 years and PRN when worn out.     Pt should be fit:  3 Left and 3 Right, custom, Jobst, flat knit, Elvarex Classic, ccl 2 or 3, knee length compression stockings with open toes to limit and control chronic leg swelling, to improve lymphatic circulation, to reduce infection risk, to limit formation of tissue fibrosis and to limit progression of lymphedema over time. Custom compression garments should be replaced q 3-6 months when worn consistently for optimal lymphedema self-management.   Pt should be fit with 1 R and 1 L custom, Jobst Relax convoluted Hours-of-Sleep (HOS)  compression device to counteract fluid accumulation and fibrosis formation leading to lymphedema progression. at night. HOS devices, medically necessary to limit fibrosis buildup in tissue, should be replaced q 2 years and PRN when worn out.    PLAN FOR NEXT SESSION:  LLE MLD as established using functional inguinal LN and deep abdominal lymphatics. Multilayer compression bandaging to L leg using 1 each 8, 10 and 12 cm wide x 5 meters long short stretch wrap over single layer of rosidal foam and cotton stockinett using gradient techniques. Pt instructed to remove wraps and report  symptoms to her doctor if she experiences acute pain in the limb, signs/symptoms of infection, or atypical sob.  Pt / family edu for LE self care: simple self MLD leg sequence  Zebedee Dec, MS, OTR/L, CLT-LANA 04/18/24 3:56 PM

## 2024-04-22 ENCOUNTER — Ambulatory Visit: Admitting: Occupational Therapy

## 2024-04-22 DIAGNOSIS — I890 Lymphedema, not elsewhere classified: Secondary | ICD-10-CM | POA: Diagnosis not present

## 2024-04-22 NOTE — Therapy (Signed)
 OUTPATIENT OCCUPATIONAL THERAPY TREATMENT NOTE    BILATERAL LOWER EXTREMITY LYMPHEDEMA Patient Name: [name] MRN: [id number] DOB:April 03, 1956, 68 y.o., female Today's Date: 04/22/2024  REPORTING PERIOD:   END OF SESSION:   OT End of Session - 04/22/24 1418     Visit Number 36    Number of Visits 36    Date for OT Re-Evaluation 06/23/24    OT Start Time 0209    OT Stop Time 0300    OT Time Calculation (min) 51 min    Activity Tolerance Patient tolerated treatment well;No increased pain    Behavior During Therapy WFL for tasks assessed/performed          Past Medical History:  Diagnosis Date   Allergy    Anemia    Anxiety    Arthritis    Chronic venous insufficiency 12/04/2015   Depression    Fibromyalgia    History of chicken pox    HTN (hypertension)    MRSA (methicillin resistant Staphylococcus aureus)    Obese    Plantar fasciitis    Pneumonia    Seasonal allergies    Sleep apnea 2012   took test in [place]-told to use cpap-could not ude   Past Surgical History:  Procedure Laterality Date   ANTERIOR CERVICAL DECOMP/DISCECTOMY FUSION  05/09/2008   C5-6, C6-7; with arthrodesis also   GASTRIC BYPASS  10/24/2000   KNEE ARTHROSCOPY  10/24/2002   rt and lt knee   ROTATOR CUFF REPAIR W/ DISTAL CLAVICLE EXCISION  01/06/2006   right   SHOULDER ARTHROSCOPY Left 10/24/2001   TONSILLECTOMY     TOTAL KNEE ARTHROPLASTY  03/29/2004   left   TOTAL KNEE ARTHROPLASTY  11/07/2003   right   TOTAL KNEE REVISION  07/30/2010   right; with lateral release   UPPER GASTROINTESTINAL ENDOSCOPY     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 I don't know when it started.  + family hx of leg swelling-mother  SUBJECTIVE:  SUBJECTIVE STATEMENT: [name] returns to OT to continue Intensive Phase CDT to LLE. Pt presents without compression wraps in place. Pt reports LE related leg pain as 3/10. She tells me she has not been wrapped since Saturday night. She states she was supposed to go without compression at least 1-2 x weekly. I thought I read that somewhere.  PAIN:  Are you having LE related pain? No: NPRS scale: 3/10 Pain location: feet Pain description: burning Aggravating factors: walking Relieving factors: elevation  PRECAUTIONS: Other: LYMPHEDEMA PRECAUTIONS  WEIGHT BEARING RESTRICTIONS: No  FALLS:  Has patient fallen in last 6 months? 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   OBJECTIVE: Note: Objective measures were completed at Evaluation unless otherwise noted.  COGNITION:  Overall cognitive status: frequent redirection needed during eval to stay on topic   POSTURE: WFL  LE ROM: grossly WFL  STRENGTH: grossly WFL   BLE COMPARATIVE LIMB VOLUMETRICS INITIAL 11/01/23  LANDMARK RIGHT  (dominant)  R LEG (A-D) 4296.6 ml  R THIGH (E-G) ml  R FULL LIMB (A-G) ml  Limb Volume differential (LVD)  %  Volume change since initial %  Volume change overall V  (Blank rows = not tested)  LANDMARK LEFT    L LEG (A-D) 4829.1 ml  L  THIGH (E-G) ml  L FULL LIMB (A-G) ml  Limb Volume differential (LVD)   12.4%, L>R  Volume change since initial %  Volume change overall %  (Blank rows = not tested)    9th Visit 12/06/23        LANDMARK LEFT    L LEG (A-D) 4888.8 ml  L  THIGH (E-G) ml  L FULL LIMB (A-G) ml  Limb Volume differential (LVD)     Volume change since initial  + 1.2 %  Volume change overall %  (Blank rows = not tested)    15 th Visit 01/10/24       LANDMARK LEFT    L LEG (A-D) 4637.8 ml  L  THIGH (E-G) ml  L FULL LIMB (A-G) ml  Limb Volume differential (LVD)     Volume change since last measured 12/06/23  DECREASED by 5.14%  Volume change overall %  (Blank rows = not tested)   20 th Visit- BLE COMPARATIVE LIMB VOLUMETRICS  02/01/24   LANDMARK LEFT    L LEG (A-D) 4139.2 ml  L  THIGH (E-G) ml  L FULL LIMB (A-G) ml  Limb Volume differential (LVD)     Volume change since initial DECREASED 10.8 %  Volume change overall %  (Blank rows = not tested)    30 th Visit- BLE COMPARATIVE LIMB VOLUMETRICS  03/25/24   LANDMARK LEFT    L LEG (A-D) 4173.5 ml  L  THIGH (E-G) ml  L FULL LIMB (A-G) ml  Limb Volume differential (LVD)     Volume change since LAST MEASURED ON 02/01/24  Inc BY 0.8 %  Volume change overall %  (Blank  rows = not tested)  Mild, Stage  II, 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 Obesity  Skin  Description Hyper-Keratosis Peau' de Orange Shiny Tight Fibrotic/ Indurated Fatty Doughy Spongy/ boggy   x    Bilateral Chronic Lipo dermatosclerosis Hardened, thickened, leathery      Skin dry Flaky WNL Macerated   mildly x     Color Redness Varicosities Blanching Hemosiderin Stain Mottled   x x x  Severe bilaterally x   Odor Malodorous Yeast Fungal infection  WNL      x   Temperature Warm Cool wnl    x     Pitting Edema   1+ 2+ 3+ 4+ Non-pitting         x   Girth Symmetrical Asymmetrical                   Distribution    L>R toes to popliteal     Stemmer Sign Positive Negative   +    Lymphorrhea History Of:  Present Absent     x    Wounds History Of Present Absent Venous Arterial Pressure Sheer     x        Signs of Infection Redness Warmth Erythema Acute Swelling Drainage Borders                    Sensation Light Touch Deep pressure Hypersensitivity Neuropathic pain   In Tact Impaired In tact Impaired Absent Impaired Plantar surfaces bilaterally   x  x  x      Nails WNL   Fungus nail dystrophy     x   Hair Growth Symmetrical Asymmetrical   x    Skin Creases Base of toes  Ankles   Base of Fingers knees       Abdominal pannus Thigh Lobules  Face/neck   x           TODAY'S TREATMENT:                                                                                                                                         LLE/LLQ MLD w Simultaneous skin L LEG multilayer gradient compression wraps  PATIENT EDUCATION:  Continued Pt/ CG edu for lymphedema self care home program throughout session. Topics include outcome of comparative limb volumetrics- starting limb volume differentials (LVDs), technology and gradient techniques used for short stretch, multilayer compression wrapping, simple self-MLD, therapeutic lymphatic pumping exercises,  skin/nail care, LE precautions,. compression garment recommendations and specifications, wear and care schedule and compression garment donning / doffing w assistive devices. Discussed progress towards all OT goals since commencing CDT. All questions answered to the Pt's satisfaction. Good return. Person educated: Patient  Education method: Explanation, Demonstration, and Handouts Education comprehension: verbalized understanding, returned demonstration, verbal cues required, and needs further education  HOME EXERCISE PROGRAM: BLE lymphatic pumping there ex using- 1 sets of 10 reps, each  exercise in order-  1-2 x daily, bilaterally Simple self MLD 1 x daily Daily skin care to increase hydration, skin mobility and decrease infection risk- can be done during MLD Compression wraps 23/7 until garment fitting complete   ASSESSMENT  CLINICAL IMPRESSION:   DME vendor emailed Pt that her garment info has been submitted to her insurance company to get benefits information. Continued LLE MLD as established. Provided simultaneous skin care, as established. As usual manual therapy is well tolerated without increased pain. L Limb volume is well reduced with very little excess lymphatic fluid remaining in tissues.  Continued  knee length compression wrapping as established. Cont as per POC.  (Initial Eval 09/13/23: Mandolin Falwell is a 68 y.o. 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 Pt denies hx of known, chronic limb swelling in her family. She does not utilize compression garments, or a vasopneumatic compression pump as conservative measures. Chronic limb swelling and associated pain limits functional performance in all occupational domains, including  functional ambulation and mobility, self-care and basic and instrumental ADLs. It limits participation in leisure pursuits, productive activities and social participle[ation.   Pt will benefit from  skilled Occupational Therapy for both  Intensive and Self -management phases of Complete Decongestive Therapy (CDT) to reduce limb swelling and associated pain, to limit progression, to reduce infection risk , and to increase functional performance, social participation and quality of life. CDT include manual lymphatic drainage (MLD), skin care, therapeutic exercise and compression therapies- multilayer bandaging initially, one limb at a time,  and then appropriate compression garments, or alternatives in the final phase. Pt and caregivers who assist are trained to perform a;; aspects of Lymphedema self-care top ensure optimal self management over time. Without skilled  OT for CDT, further progression of lymphedema is certain and further functional decline is expected.)   OBJECTIVE IMPAIRMENTS: decreased activity tolerance, decreased knowledge of condition, decreased knowledge of use of DME, increased edema, pain, and chronic leg swelling.   ACTIVITY LIMITATIONS: limited functional ambulation and functional mobility 2/2 lymphedema -related foot and leg pain; standing, squatting, stairs, bathing, toileting, dressing, and hygiene/grooming  PARTICIPATION LIMITATIONS: meal prep, cooking, cleaning, driving, shopping, community activity, and volunteering  PERSONAL FACTORS: Time since onset of injury/illness/exacerbation are also affecting patient's functional outcome.   REHAB POTENTIAL: Good  CLINICAL DECISION MAKING: Evolving/moderate complexity  EVALUATION COMPLEXITY: Moderate   GOALS: Goals reviewed with patient? Yes  SHORT TERM GOALS: Target date: 4th OT Rx visit   Pt will demonstrate understanding of lymphedema precautions and prevention strategies with modified independence using a printed  reference to identify at least 5 precautions and discussing how s/he may implement them into daily life to reduce risk of progression with extra time. Baseline:Max A Goal status: 11/20/23 GOAL MET  2.  Pt will be able to apply multilayer, knee length, gradient, compression wraps to one leg at a time with max caregiver assistance to decrease limb volume, to limit infection risk, and to limit lymphedema progression.  Baseline: Dependent Goal status: 11/13/23 GOAL MET  LONG TERM GOALS: Target date: 12/12/22  Given this patient's Intake score of TBD % on the functional outcomes FOTO tool, patient will experience an increase in function of 5 points to improve basic and instrumental ADLs performance, including lymphedema self-care.  Baseline: Max A  Goal status: GOAL DEFERRED. FOTO tool discontinued.  2.  Given this patient's Intake score of 64.71 on the Lymphedema Life Impact Scale (LLIS), patient will experience a reduction of at least 5 points in her perceived level of functional impairment resulting from lymphedema to improve functional performance and quality of life (QOL). Baseline: 64.71% Goal status: PROGRESSING  3.  Pt will achieve at least a 10% volume reduction in B legs to return limb to typical size and shape, to limit infection risk and LE progression, to decrease pain, to improve function. Baseline: Dependent Goal status: PARTIALLY MET:  FOR L LEG with 10.8% REDUCTION.   4.  Pt will obtain proper compression garments/devices and achieve modified independence (extra time + assistive devices) with donning/doffing to optimize limb volume reductions and limit LE progression over time. Baseline:  Goal status: PROGRESSING. OT ordered LLE custom garment on 02/07/24. Process has been delayed by MD not returning signed script to DME vendor. PLAN:  OT FREQUENCY: 2x/week  OT DURATION: 12 weeks  PLANNED INTERVENTIONS: 97110-Therapeutic exercises, 97530- Therapeutic activity, 97535- Self Care,  97140- Manual therapy, Patient/Family education, Manual lymph drainage, Compression bandaging, DME instructions, and fitting with compression garments  Custom-made gradient compression garments and HOS devices are medically necessary because they are uniquely sized and shaped to fit the exact dimensions of the affected extremities, and to provide appropriate medical grade, graduated compression essential for optimally managing chronic, progressive lymphedema. Multiple custom compression garments are needed to ensure proper hygiene to limit infection risk. Custom compression garments should be replaced q 3-6 months When worn consistently for optimal lipo-lymphedema self-management over time. HOS devices, medically necessary to limit fibrosis buildup in tissue, should be replaced q 2 years and PRN when worn out.     Pt should be fit:  3 Left and 3 Right, custom, Jobst, flat knit, Elvarex Classic, ccl 2 or 3, knee length compression stockings with open toes to limit and control chronic leg swelling, to improve lymphatic circulation, to reduce infection risk, to limit formation of tissue fibrosis and to limit progression of lymphedema over time. Custom compression garments should be replaced q 3-6 months when worn consistently for optimal lymphedema self-management.   Pt should be fit with 1 R and 1 L custom, Jobst Relax convoluted Hours-of-Sleep (HOS)  compression device to counteract fluid accumulation and fibrosis formation leading to lymphedema progression. at night. HOS devices, medically necessary to limit fibrosis buildup in tissue, should be replaced q 2 years and PRN when worn out.    PLAN FOR NEXT SESSION:  LLE MLD as established using functional inguinal LN and deep abdominal lymphatics. Multilayer compression bandaging to L leg using 1 each 8, 10 and 12 cm wide x 5 meters long short stretch wrap over single layer of rosidal foam and cotton stockinett using gradient techniques. Pt instructed to  remove wraps and report symptoms to her doctor if she experiences acute pain in the limb, signs/symptoms of infection, or atypical sob.  Pt / family edu for LE self care: simple self MLD leg sequence  Zebedee Dec, MS, OTR/L, CLT-LANA 04/22/24 3:01 PM

## 2024-04-23 ENCOUNTER — Telehealth: Payer: Self-pay

## 2024-04-23 NOTE — Telephone Encounter (Signed)
Duplicate phone note. Closing this one.  

## 2024-04-23 NOTE — Telephone Encounter (Signed)
 LMTCB

## 2024-04-23 NOTE — Telephone Encounter (Signed)
 Per chart she is to take 2 caps daily. Correct?

## 2024-04-23 NOTE — Telephone Encounter (Signed)
 Copied from CRM [id number]. Topic: Clinical - Medication Question >> Apr 23, 2024 11:58 AM Macario HERO wrote: Reason for CRM: Patient would like clarity on her prescription directions: DULoxetine  (CYMBALTA ) 60 MG capsule [[id number]]

## 2024-04-23 NOTE — Telephone Encounter (Unsigned)
 Copied from CRM [id number]. Topic: Clinical - Medication Question >> Apr 23, 2024 11:58 AM Macario HERO wrote: Reason for CRM: Patient would like clarity on her prescription directions: DULoxetine  (CYMBALTA ) 60 MG capsule [[id number]]

## 2024-04-24 NOTE — Telephone Encounter (Signed)
 Spoke with patient and advised patient that rx is 2 60 mg caps daily. Patient verbalized understanding and was only asking because her insurance is giving her a hard time.

## 2024-04-25 ENCOUNTER — Ambulatory Visit: Attending: Podiatry | Admitting: Occupational Therapy

## 2024-04-25 DIAGNOSIS — I890 Lymphedema, not elsewhere classified: Secondary | ICD-10-CM | POA: Diagnosis not present

## 2024-04-25 NOTE — Therapy (Signed)
 OUTPATIENT OCCUPATIONAL THERAPY TREATMENT NOTE    BILATERAL LOWER EXTREMITY LYMPHEDEMA Patient Name: CANTRELL MARTUS MRN: [id number] DOB:10-07-1956, 68 y.o., female Today's Date: 04/25/2024  REPORTING PERIOD:   END OF SESSION:   OT End of Session - 04/25/24 1510     Visit Number 37    Number of Visits 72    Date for OT Re-Evaluation 06/23/24    OT Start Time 0305    OT Stop Time 0403    OT Time Calculation (min) 58 min    Activity Tolerance Patient tolerated treatment well;No increased pain    Behavior During Therapy WFL for tasks assessed/performed          Past Medical History:  Diagnosis Date   Allergy    Anemia    Anxiety    Arthritis    Chronic venous insufficiency 12/04/2015   Depression    Fibromyalgia    History of chicken pox    HTN (hypertension)    MRSA (methicillin resistant Staphylococcus aureus)    Obese    Plantar fasciitis    Pneumonia    Seasonal allergies    Sleep apnea 2012   took test in [place]-told to use cpap-could not ude   Past Surgical History:  Procedure Laterality Date   ANTERIOR CERVICAL DECOMP/DISCECTOMY FUSION  05/09/2008   C5-6, C6-7; with arthrodesis also   GASTRIC BYPASS  10/24/2000   KNEE ARTHROSCOPY  10/24/2002   rt and lt knee   ROTATOR CUFF REPAIR W/ DISTAL CLAVICLE EXCISION  01/06/2006   right   SHOULDER ARTHROSCOPY Left 10/24/2001   TONSILLECTOMY     TOTAL KNEE ARTHROPLASTY  03/29/2004   left   TOTAL KNEE ARTHROPLASTY  11/07/2003   right   TOTAL KNEE REVISION  07/30/2010   right; with lateral release   UPPER GASTROINTESTINAL ENDOSCOPY     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 I don't know when it started.  + family hx of leg swelling-mother  SUBJECTIVE:  SUBJECTIVE STATEMENT: HOLLAN PHILIPP returns to OT to continue Intensive Phase CDT to LLE. Pt presents without compression wraps in place. Pt reports LE related leg pain as 3/10. She tells me she has not been wrapped since Saturday night. She states she was supposed to go without compression at least 1-2 x weekly. I thought I read that somewhere.  PAIN:  Are you having LE related pain? No: NPRS scale: 3/10 Pain location: feet Pain description: burning Aggravating factors: walking Relieving factors: elevation  PRECAUTIONS: Other: LYMPHEDEMA PRECAUTIONS  WEIGHT BEARING RESTRICTIONS: No  FALLS:  Has patient fallen in last 6 months? 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   OBJECTIVE: Note: Objective measures were completed at Evaluation unless otherwise noted.  COGNITION:  Overall cognitive status: frequent redirection needed during eval to stay on topic   POSTURE: WFL  LE ROM: grossly WFL  STRENGTH: grossly WFL   BLE COMPARATIVE LIMB VOLUMETRICS INITIAL 11/01/23  LANDMARK RIGHT  (dominant)  R LEG (A-D) 4296.6 ml  R THIGH (E-G) ml  R FULL LIMB (A-G) ml  Limb Volume differential (LVD)  %  Volume change since initial %  Volume change overall V  (Blank rows = not tested)  LANDMARK LEFT    L LEG (A-D) 4829.1 ml  L  THIGH (E-G) ml  L FULL LIMB (A-G) ml  Limb Volume differential (LVD)   12.4%, L>R  Volume change since initial %  Volume change overall %  (Blank rows = not tested)    9th Visit 12/06/23        LANDMARK LEFT    L LEG (A-D) 4888.8 ml  L  THIGH (E-G) ml  L FULL LIMB (A-G) ml  Limb Volume differential (LVD)     Volume change since initial  + 1.2 %  Volume change overall %  (Blank rows = not tested)    15 th Visit 01/10/24       LANDMARK LEFT    L LEG (A-D) 4637.8 ml  L  THIGH (E-G) ml  L FULL LIMB (A-G) ml  Limb Volume differential (LVD)     Volume change since last measured 12/06/23  DECREASED by 5.14%  Volume change overall %  (Blank rows = not tested)   20 th Visit- BLE COMPARATIVE LIMB VOLUMETRICS  02/01/24   LANDMARK LEFT    L LEG (A-D) 4139.2 ml  L  THIGH (E-G) ml  L FULL LIMB (A-G) ml  Limb Volume differential (LVD)     Volume change since initial DECREASED 10.8 %  Volume change overall %  (Blank rows = not tested)    30 th Visit- BLE COMPARATIVE LIMB VOLUMETRICS  03/25/24   LANDMARK LEFT    L LEG (A-D) 4173.5 ml  L  THIGH (E-G) ml  L FULL LIMB (A-G) ml  Limb Volume differential (LVD)     Volume change since LAST MEASURED ON 02/01/24  Inc BY 0.8 %  Volume change overall %  (Blank  rows = not tested)  Mild, Stage  II, 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 Obesity  Skin  Description Hyper-Keratosis Peau' de Orange Shiny Tight Fibrotic/ Indurated Fatty Doughy Spongy/ boggy   x    Bilateral Chronic Lipo dermatosclerosis Hardened, thickened, leathery      Skin dry Flaky WNL Macerated   mildly x     Color Redness Varicosities Blanching Hemosiderin Stain Mottled   x x x  Severe bilaterally x   Odor Malodorous Yeast Fungal infection  WNL      x   Temperature Warm Cool wnl    x     Pitting Edema   1+ 2+ 3+ 4+ Non-pitting         x   Girth Symmetrical Asymmetrical                   Distribution    L>R toes to popliteal     Stemmer Sign Positive Negative   +    Lymphorrhea History Of:  Present Absent     x    Wounds History Of Present Absent Venous Arterial Pressure Sheer     x        Signs of Infection Redness Warmth Erythema Acute Swelling Drainage Borders                    Sensation Light Touch Deep pressure Hypersensitivity Neuropathic pain   In Tact Impaired In tact Impaired Absent Impaired Plantar surfaces bilaterally   x  x  x      Nails WNL   Fungus nail dystrophy     x   Hair Growth Symmetrical Asymmetrical   x    Skin Creases Base of toes  Ankles   Base of Fingers knees       Abdominal pannus Thigh Lobules  Face/neck   x           TODAY'S TREATMENT:                                                                                                                                         LLE/LLQ MLD w Simultaneous skin L LEG multilayer gradient compression wraps  PATIENT EDUCATION:  Continued Pt/ CG edu for lymphedema self care home program throughout session. Topics include outcome of comparative limb volumetrics- starting limb volume differentials (LVDs), technology and gradient techniques used for short stretch, multilayer compression wrapping, simple self-MLD, therapeutic lymphatic pumping exercises,  skin/nail care, LE precautions,. compression garment recommendations and specifications, wear and care schedule and compression garment donning / doffing w assistive devices. Discussed progress towards all OT goals since commencing CDT. All questions answered to the Pt's satisfaction. Good return. Person educated: Patient  Education method: Explanation, Demonstration, and Handouts Education comprehension: verbalized understanding, returned demonstration, verbal cues required, and needs further education  HOME EXERCISE PROGRAM: BLE lymphatic pumping there ex using- 1 sets of 10 reps, each  exercise in order-  1-2 x daily, bilaterally Simple self MLD 1 x daily Daily skin care to increase hydration, skin mobility and decrease infection risk- can be done during MLD Compression wraps 23/7 until garment fitting complete   ASSESSMENT  CLINICAL IMPRESSION:  Continued LLE MLD as established. Provided simultaneous skin care w castor oil. Pt has 3 small nicks in the skin she thinks is from toweling off after a shower. Reviewed importance of impeccable skin care, including first aid, to limit cellulitis risk. Cleaned area w alcohol wipe and applied Telfa non stick portion of band aide. Pt not laundering compression bandages as instructed. Wraps are somewhat soiled today. Reapplied compression wraps. Issued 1/2 roll of foam  since existing foam does not cover bulge at top of leg from increased swelling. Pt encouraged to call DME vendor to check status of LLE garment order. Cont as per POC.  (Initial Eval 09/13/23: Rhemi Balbach is a 68 y.o. 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 Pt denies hx of known, chronic limb swelling in her family. She does not utilize  compression garments, or a vasopneumatic compression pump as conservative measures. Chronic limb swelling and associated pain limits functional performance in all occupational domains, including functional ambulation and mobility, self-care and basic and instrumental ADLs. It limits participation in leisure pursuits, productive activities and social participle[ation.   Pt will benefit from  skilled Occupational Therapy for both  Intensive and Self -management phases of Complete Decongestive Therapy (CDT) to reduce limb swelling and associated pain, to limit progression, to reduce infection risk , and to increase functional performance, social participation and quality of life. CDT include manual lymphatic drainage (MLD), skin care, therapeutic exercise and compression therapies- multilayer bandaging initially, one limb at a time,  and then appropriate compression garments, or alternatives in the final phase. Pt and caregivers who assist are trained to perform a;; aspects of Lymphedema self-care top ensure optimal self management over time. Without skilled  OT for CDT, further progression of lymphedema is certain and further functional decline is expected.)   OBJECTIVE IMPAIRMENTS: decreased activity tolerance, decreased knowledge of condition, decreased knowledge of use of DME, increased edema, pain, and chronic leg swelling.   ACTIVITY LIMITATIONS: limited functional ambulation and functional mobility 2/2 lymphedema -related foot and leg pain; standing, squatting, stairs, bathing, toileting, dressing, and hygiene/grooming  PARTICIPATION LIMITATIONS: meal prep, cooking, cleaning, driving, shopping, community activity, and volunteering  PERSONAL FACTORS: Time since onset of injury/illness/exacerbation are also affecting patient's functional outcome.   REHAB POTENTIAL: Good  CLINICAL DECISION MAKING: Evolving/moderate complexity  EVALUATION COMPLEXITY: Moderate   GOALS: Goals reviewed with  patient? Yes  SHORT TERM GOALS: Target date: 4th OT Rx visit   Pt will demonstrate understanding of lymphedema precautions and prevention strategies with modified independence using a printed reference to identify at least 5 precautions and discussing how s/he may implement them into daily life to reduce risk of progression with extra time. Baseline:Max A Goal status: 11/20/23 GOAL MET  2.  Pt will be able to apply multilayer, knee length, gradient, compression wraps to one leg at a time with max caregiver assistance to decrease limb volume, to limit infection risk, and to limit lymphedema progression.  Baseline: Dependent Goal status: 11/13/23 GOAL MET  LONG TERM GOALS: Target date: 12/12/22  Given this patient's Intake score of TBD % on the functional outcomes FOTO tool, patient will experience an increase in function of 5 points to improve basic and instrumental ADLs performance, including lymphedema self-care.  Baseline: Max A Goal status: GOAL DEFERRED. FOTO tool discontinued.  2.  Given this patient's Intake score of 64.71 on the Lymphedema Life Impact Scale (LLIS), patient will experience a reduction of at least 5 points in her perceived level of functional impairment resulting from lymphedema to improve functional performance and quality of life (QOL). Baseline: 64.71% Goal status: PROGRESSING  3.  Pt will achieve at least a 10% volume reduction in B legs to return limb to typical size and shape, to limit infection risk and LE progression, to decrease pain, to improve function. Baseline: Dependent Goal status: PARTIALLY MET:  FOR L LEG with 10.8% REDUCTION.   4.  Pt will obtain proper compression garments/devices and achieve modified independence (extra time + assistive devices) with donning/doffing to optimize limb volume reductions and limit LE progression over time. Baseline:  Goal status: PROGRESSING. OT ordered LLE custom garment on 02/07/24. Process has been delayed by MD not  returning signed script to DME vendor. PLAN:  OT FREQUENCY: 2x/week  OT DURATION: 12 weeks  PLANNED INTERVENTIONS: 97110-Therapeutic exercises, 97530- Therapeutic activity, 97535- Self Care, [id number]- Manual therapy, Patient/Family education, Manual lymph drainage, Compression bandaging, DME instructions, and fitting with compression garments  Custom-made gradient compression garments and HOS devices are medically necessary because they are uniquely sized and shaped to fit the exact dimensions of the affected extremities, and to provide appropriate medical grade, graduated compression essential for optimally managing chronic, progressive lymphedema. Multiple custom compression garments are needed to ensure proper hygiene to limit infection risk. Custom compression garments should be replaced q 3-6 months When worn consistently for optimal lipo-lymphedema self-management over time. HOS devices, medically necessary to limit fibrosis buildup in tissue, should be replaced q 2 years and PRN when worn out.     Pt should be fit:  3 Left and 3 Right, custom, Jobst, flat knit, Elvarex Classic, ccl 2 (23-32 mmHg) knee length compression stockings with open toes to limit and control chronic leg swelling, to improve lymphatic circulation, to reduce infection risk, to limit formation of tissue fibrosis and to limit progression of lymphedema over time. Custom compression garments should be replaced q 3-6 months when worn consistently for optimal lymphedema self-management.   Pt should be fit with 1 R and 1 L custom, Jobst Relax, ccl 2, convoluted Hours-of-Sleep (HOS)  compression device to counteract fluid accumulation and fibrosis formation leading to lymphedema progression. at night. HOS devices, medically necessary to limit fibrosis buildup in tissue, should be replaced q 2 years and PRN when worn out.    PLAN FOR NEXT SESSION:  LLE MLD as established using functional inguinal LN and deep abdominal  lymphatics. Multilayer compression bandaging to L leg using 1 each 8, 10 and 12 cm wide x 5 meters long short stretch wrap over single layer of rosidal foam and cotton stockinett using gradient techniques. Pt instructed to remove wraps and report symptoms to her doctor if she experiences acute pain in the limb, signs/symptoms of infection, or atypical sob.  Pt / family edu for LE self care: simple self MLD leg sequence  Zebedee Dec, MS, OTR/L, CLT-LANA 04/25/24 4:05 PM

## 2024-04-29 ENCOUNTER — Ambulatory Visit: Admitting: Occupational Therapy

## 2024-05-02 ENCOUNTER — Ambulatory Visit: Admitting: Occupational Therapy

## 2024-05-02 DIAGNOSIS — I890 Lymphedema, not elsewhere classified: Secondary | ICD-10-CM | POA: Diagnosis not present

## 2024-05-02 NOTE — Therapy (Signed)
 OUTPATIENT OCCUPATIONAL THERAPY TREATMENT NOTE    BILATERAL LOWER EXTREMITY LYMPHEDEMA Patient Name: [name] MRN: [id number] DOB:01/25/1956, 68 y.o., female Today's Date: 05/02/2024  REPORTING PERIOD:   END OF SESSION:   OT End of Session - 05/02/24 1513     Visit Number 38    Number of Visits 72    Date for OT Re-Evaluation 06/23/24    OT Start Time 0200    OT Stop Time 0300    OT Time Calculation (min) 60 min    Activity Tolerance Patient tolerated treatment well;No increased pain    Behavior During Therapy WFL for tasks assessed/performed          Past Medical History:  Diagnosis Date   Allergy    Anemia    Anxiety    Arthritis    Chronic venous insufficiency 12/04/2015   Depression    Fibromyalgia    History of chicken pox    HTN (hypertension)    MRSA (methicillin resistant Staphylococcus aureus)    Obese    Plantar fasciitis    Pneumonia    Seasonal allergies    Sleep apnea 2012   took test in [place]-told to use cpap-could not ude   Past Surgical History:  Procedure Laterality Date   ANTERIOR CERVICAL DECOMP/DISCECTOMY FUSION  05/09/2008   C5-6, C6-7; with arthrodesis also   GASTRIC BYPASS  10/24/2000   KNEE ARTHROSCOPY  10/24/2002   rt and lt knee   ROTATOR CUFF REPAIR W/ DISTAL CLAVICLE EXCISION  01/06/2006   right   SHOULDER ARTHROSCOPY Left 10/24/2001   TONSILLECTOMY     TOTAL KNEE ARTHROPLASTY  03/29/2004   left   TOTAL KNEE ARTHROPLASTY  11/07/2003   right   TOTAL KNEE REVISION  07/30/2010   right; with lateral release   UPPER GASTROINTESTINAL ENDOSCOPY     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 I don't know when it started.  + family hx of leg swelling-mother  SUBJECTIVE:  SUBJECTIVE STATEMENT: [name] returns to OT to continue Intensive Phase CDT to LLE. Pt presents without compression wraps in place. Pt reports LE related leg pain as 3/10. She tells me she has not been wrapped since Saturday night. She states she was supposed to go without compression at least 1-2 x weekly. I thought I read that somewhere.  PAIN:  Are you having LE related pain? No: NPRS scale: 3/10 Pain location: feet Pain description: burning Aggravating factors: walking Relieving factors: elevation  PRECAUTIONS: Other: LYMPHEDEMA PRECAUTIONS  WEIGHT BEARING RESTRICTIONS: No  FALLS:  Has patient fallen in last 6 months? 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   OBJECTIVE: Note: Objective measures were completed at Evaluation unless otherwise noted.  COGNITION:  Overall cognitive status: frequent redirection needed during eval to stay on topic   POSTURE: WFL  LE ROM: grossly WFL  STRENGTH: grossly WFL   BLE COMPARATIVE LIMB VOLUMETRICS INITIAL 11/01/23  LANDMARK RIGHT  (dominant)  R LEG (A-D) 4296.6 ml  R THIGH (E-G) ml  R FULL LIMB (A-G) ml  Limb Volume differential (LVD)  %  Volume change since initial %  Volume change overall V  (Blank rows = not tested)  LANDMARK LEFT    L LEG (A-D) 4829.1 ml  L  THIGH (E-G) ml  L FULL LIMB (A-G) ml  Limb Volume differential (LVD)   12.4%, L>R  Volume change since initial %  Volume change overall %  (Blank rows = not tested)    9th Visit 12/06/23        LANDMARK LEFT    L LEG (A-D) 4888.8 ml  L  THIGH (E-G) ml  L FULL LIMB (A-G) ml  Limb Volume differential (LVD)     Volume change since initial  + 1.2 %  Volume change overall %  (Blank rows = not tested)    15 th Visit 01/10/24       LANDMARK LEFT    L LEG (A-D) 4637.8 ml  L  THIGH (E-G) ml  L FULL LIMB (A-G) ml  Limb Volume differential (LVD)     Volume change since last measured 12/06/23  DECREASED by 5.14%  Volume change overall %  (Blank rows = not tested)   20 th Visit- BLE COMPARATIVE LIMB VOLUMETRICS  02/01/24   LANDMARK LEFT    L LEG (A-D) 4139.2 ml  L  THIGH (E-G) ml  L FULL LIMB (A-G) ml  Limb Volume differential (LVD)     Volume change since initial DECREASED 10.8 %  Volume change overall %  (Blank rows = not tested)    30 th Visit- BLE COMPARATIVE LIMB VOLUMETRICS  03/25/24   LANDMARK LEFT    L LEG (A-D) 4173.5 ml  L  THIGH (E-G) ml  L FULL LIMB (A-G) ml  Limb Volume differential (LVD)     Volume change since LAST MEASURED ON 02/01/24  Inc BY 0.8 %  Volume change overall %  (Blank  rows = not tested)  Mild, Stage  II, 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 Obesity  Skin  Description Hyper-Keratosis Peau' de Orange Shiny Tight Fibrotic/ Indurated Fatty Doughy Spongy/ boggy   x    Bilateral Chronic Lipo dermatosclerosis Hardened, thickened, leathery      Skin dry Flaky WNL Macerated   mildly x     Color Redness Varicosities Blanching Hemosiderin Stain Mottled   x x x  Severe bilaterally x   Odor Malodorous Yeast Fungal infection  WNL      x   Temperature Warm Cool wnl    x     Pitting Edema   1+ 2+ 3+ 4+ Non-pitting         x   Girth Symmetrical Asymmetrical                   Distribution    L>R toes to popliteal     Stemmer Sign Positive Negative   +    Lymphorrhea History Of:  Present Absent     x    Wounds History Of Present Absent Venous Arterial Pressure Sheer     x        Signs of Infection Redness Warmth Erythema Acute Swelling Drainage Borders                    Sensation Light Touch Deep pressure Hypersensitivity Neuropathic pain   In Tact Impaired In tact Impaired Absent Impaired Plantar surfaces bilaterally   x  x  x      Nails WNL   Fungus nail dystrophy     x   Hair Growth Symmetrical Asymmetrical   x    Skin Creases Base of toes  Ankles   Base of Fingers knees       Abdominal pannus Thigh Lobules  Face/neck   x           TODAY'S TREATMENT:                                                                                                                                         LLE/LLQ MLD w Simultaneous skin L LEG multilayer gradient compression wraps  PATIENT EDUCATION:  Continued Pt/ CG edu for lymphedema self care home program throughout session. Topics include outcome of comparative limb volumetrics- starting limb volume differentials (LVDs), technology and gradient techniques used for short stretch, multilayer compression wrapping, simple self-MLD, therapeutic lymphatic pumping exercises,  skin/nail care, LE precautions,. compression garment recommendations and specifications, wear and care schedule and compression garment donning / doffing w assistive devices. Discussed progress towards all OT goals since commencing CDT. All questions answered to the Pt's satisfaction. Good return. Person educated: Patient  Education method: Explanation, Demonstration, and Handouts Education comprehension: verbalized understanding, returned demonstration, verbal cues required, and needs further education  HOME EXERCISE PROGRAM: BLE lymphatic pumping there ex using- 1 sets of 10 reps, each  exercise in order-  1-2 x daily, bilaterally Simple self MLD 1 x daily Daily skin care to increase hydration, skin mobility and decrease infection risk- can be done during MLD Compression wraps 23/7 until garment fitting complete   ASSESSMENT  CLINICAL IMPRESSION:  Continued LLE/LLQ MLD as established. Provided simultaneous skin care w castor oil to improve hydration. Reapplied compression wraps. Pt encouraged to call DME vendor to check status of LLE garment order. Cont as per POC.  (Initial Eval 09/13/23: Lennie Dunnigan is a 68 y.o. 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 Pt denies hx of known, chronic limb swelling in her family. She does not utilize compression garments, or a vasopneumatic compression pump as conservative measures. Chronic limb swelling and associated pain limits functional performance in all occupational domains, including functional ambulation and mobility, self-care and basic and instrumental ADLs. It limits participation in leisure pursuits, productive activities and social participle[ation.   Pt will benefit from  skilled Occupational  Therapy for both  Intensive and Self -management phases of Complete Decongestive Therapy (CDT) to reduce limb swelling and associated pain, to limit progression, to reduce infection risk , and to increase functional performance, social participation and quality of life. CDT include manual lymphatic drainage (MLD), skin care, therapeutic exercise and compression therapies- multilayer bandaging initially, one limb at a time,  and then appropriate compression garments, or alternatives in the final phase. Pt and caregivers who assist are trained to perform a;; aspects of Lymphedema self-care top ensure optimal self management over time. Without skilled  OT for CDT, further progression of lymphedema is certain and further functional decline is expected.)   OBJECTIVE IMPAIRMENTS: decreased activity tolerance, decreased knowledge of condition, decreased knowledge of use of DME, increased edema, pain, and chronic leg swelling.   ACTIVITY LIMITATIONS: limited functional ambulation and functional mobility 2/2 lymphedema -related foot and leg pain; standing, squatting, stairs, bathing, toileting, dressing, and hygiene/grooming  PARTICIPATION LIMITATIONS: meal prep, cooking, cleaning, driving, shopping, community activity, and volunteering  PERSONAL FACTORS: Time since onset of injury/illness/exacerbation are also affecting patient's functional outcome.   REHAB POTENTIAL: Good  CLINICAL DECISION MAKING: Evolving/moderate complexity  EVALUATION COMPLEXITY: Moderate   GOALS: Goals reviewed with patient? Yes  SHORT TERM GOALS: Target date: 4th OT Rx visit   Pt will demonstrate understanding of lymphedema precautions and prevention strategies with modified independence using a printed reference to identify at least 5 precautions and discussing how s/he may implement them into daily life to reduce risk of progression with extra time. Baseline:Max A Goal status: 11/20/23 GOAL MET  2.  Pt will be able to  apply multilayer, knee length, gradient, compression wraps to one leg at a time with max caregiver assistance to decrease limb volume, to limit infection risk, and to limit lymphedema progression.  Baseline: Dependent Goal status: 11/13/23 GOAL MET  LONG TERM GOALS: Target date: 12/12/22  Given this patient's Intake score of TBD % on the functional outcomes FOTO tool, patient will experience an increase in function of 5 points to improve basic and instrumental ADLs performance, including lymphedema self-care.  Baseline: Max A Goal status: GOAL DEFERRED. FOTO tool discontinued.  2.  Given this patient's Intake score of 64.71 on the Lymphedema Life Impact Scale (LLIS), patient will experience a reduction of at least 5 points  in her perceived level of functional impairment resulting from lymphedema to improve functional performance and quality of life (QOL). Baseline: 64.71% Goal status: PROGRESSING  3.  Pt will achieve at least a 10% volume reduction in B legs to return limb to typical size and shape, to limit infection risk and LE progression, to decrease pain, to improve function. Baseline: Dependent Goal status: PARTIALLY MET:  FOR L LEG with 10.8% REDUCTION.   4.  Pt will obtain proper compression garments/devices and achieve modified independence (extra time + assistive devices) with donning/doffing to optimize limb volume reductions and limit LE progression over time. Baseline:  Goal status: PROGRESSING. OT ordered LLE custom garment on 02/07/24. Process has been delayed by MD not returning signed script to DME vendor. PLAN:  OT FREQUENCY: 2x/week  OT DURATION: 12 weeks  PLANNED INTERVENTIONS: 97110-Therapeutic exercises, 97530- Therapeutic activity, 97535- Self Care, [id number]- Manual therapy, Patient/Family education, Manual lymph drainage, Compression bandaging, DME instructions, and fitting with compression garments  Custom-made gradient compression garments and HOS devices are  medically necessary because they are uniquely sized and shaped to fit the exact dimensions of the affected extremities, and to provide appropriate medical grade, graduated compression essential for optimally managing chronic, progressive lymphedema. Multiple custom compression garments are needed to ensure proper hygiene to limit infection risk. Custom compression garments should be replaced q 3-6 months When worn consistently for optimal lipo-lymphedema self-management over time. HOS devices, medically necessary to limit fibrosis buildup in tissue, should be replaced q 2 years and PRN when worn out.     Pt should be fit:  3 Left and 3 Right, custom, Jobst, flat knit, Elvarex Classic, ccl 2 (23-32 mmHg) knee length compression stockings with open toes to limit and control chronic leg swelling, to improve lymphatic circulation, to reduce infection risk, to limit formation of tissue fibrosis and to limit progression of lymphedema over time. Custom compression garments should be replaced q 3-6 months when worn consistently for optimal lymphedema self-management.   Pt should be fit with 1 R and 1 L custom, Jobst Relax, ccl 2, convoluted Hours-of-Sleep (HOS)  compression device to counteract fluid accumulation and fibrosis formation leading to lymphedema progression. at night. HOS devices, medically necessary to limit fibrosis buildup in tissue, should be replaced q 2 years and PRN when worn out.    PLAN FOR NEXT SESSION:  LLE MLD as established using functional inguinal LN and deep abdominal lymphatics. Multilayer compression bandaging to L leg using 1 each 8, 10 and 12 cm wide x 5 meters long short stretch wrap over single layer of rosidal foam and cotton stockinett using gradient techniques. Pt instructed to remove wraps and report symptoms to her doctor if she experiences acute pain in the limb, signs/symptoms of infection, or atypical sob.  Pt / family edu for LE self care: simple self MLD leg  sequence  Zebedee Dec, MS, OTR/L, CLT-LANA 05/02/24 4:00 PM

## 2024-05-03 ENCOUNTER — Ambulatory Visit (INDEPENDENT_AMBULATORY_CARE_PROVIDER_SITE_OTHER)

## 2024-05-03 VITALS — Ht 66.0 in | Wt 244.0 lb

## 2024-05-03 DIAGNOSIS — Z0000 Encounter for general adult medical examination without abnormal findings: Secondary | ICD-10-CM | POA: Diagnosis not present

## 2024-05-03 NOTE — Progress Notes (Signed)
 Please attest and cosign this visit due to patients primary care provider not being in the office at the time the visit was completed.    Subjective:   AILY TZENG is a 68 y.o. who presents for a Medicare Wellness preventive visit.  As a reminder, Annual Wellness Visits don't include a physical exam, and some assessments may be limited, especially if this visit is performed virtually. We may recommend an in-person follow-up visit with your provider if needed.  Visit Complete: Virtual I connected with  Treva ONEIDA Hafford on 05/03/24 by a audio enabled telemedicine application and verified that I am speaking with the correct person using two identifiers.  Patient Location: Home  Provider Location: Office/Clinic  I discussed the limitations of evaluation and management by telemedicine. The patient expressed understanding and agreed to proceed.  Vital Signs: Because this visit was a virtual/telehealth visit, some criteria may be missing or patient reported. Any vitals not documented were not able to be obtained and vitals that have been documented are patient reported.  VideoDeclined- This patient declined Librarian, academic. Therefore the visit was completed with audio only.  Persons Participating in Visit: Patient.  AWV Questionnaire: No: Patient Medicare AWV questionnaire was not completed prior to this visit.  Cardiac Risk Factors include: advanced age (>41men, >35 women);hypertension;sedentary lifestyle;obesity (BMI >30kg/m2)     Objective:    Today's Vitals   05/03/24 1347 05/03/24 1348  Weight: 244 lb (110.7 kg)   Height: 5' 6 (1.676 m)   PainSc:  4    Body mass index is 39.38 kg/m.     05/03/2024    2:05 PM 11/02/2023    2:34 PM 09/13/2023    1:18 PM 07/14/2018    8:46 PM 07/27/2017    8:32 PM 06/09/2017    4:43 AM 06/08/2017    8:25 PM  Advanced Directives  Does Patient Have a Medical Advance Directive? No No No No  No  No  No   Would  patient like information on creating a medical advance directive?  No - Patient declined  No - Patient declined   No - Patient declined  No - Patient declined      Data saved with a previous flowsheet row definition    Current Medications (verified) Outpatient Encounter Medications as of 05/03/2024  Medication Sig   BIOTIN PO Take 1 tablet by mouth daily.   Cyanocobalamin (B-12 PO) Take 1 tablet by mouth daily.   Dihydroxyaluminum Sod Carb (ROLAIDS PO) Take 1 each by mouth as needed.   DULoxetine  (CYMBALTA ) 60 MG capsule Take 2 capsules (120 mg total) by mouth daily.   ferrous sulfate  325 (65 FE) MG tablet TAKE 1 TABLET BY MOUTH EVERY DAY   hydrochlorothiazide  (HYDRODIURIL ) 12.5 MG tablet TAKE 1 TABLET BY MOUTH EVERY DAY   lisinopril  (ZESTRIL ) 10 MG tablet Take 1 tablet (10 mg total) by mouth daily.   Magnesium 400 MG TABS Take 1 tablet by mouth daily.   morphine  (MS CONTIN ) 30 MG 12 hr tablet Take 1 tablet (30 mg total) by mouth every 12 (twelve) hours.   [START ON 05/08/2024] morphine  (MS CONTIN ) 30 MG 12 hr tablet Take 1 tablet (30 mg total) by mouth every 12 (twelve) hours.   SYMPROIC 0.2 MG TABS Take 1 tablet by mouth daily as needed.   tirzepatide  10 MG/0.5ML injection vial Inject 10 mg into the skin once a week.   tiZANidine  (ZANAFLEX ) 2 MG tablet Take 1 tablet (2  mg total) by mouth 3 (three) times daily.   tiZANidine  (ZANAFLEX ) 2 MG tablet Take 1 tablet (2 mg total) by mouth 3 (three) times daily as needed.   traZODone  (DESYREL ) 50 MG tablet Take 1 tablet (50 mg total) by mouth at bedtime.   traZODone  (DESYREL ) 50 MG tablet Take 1 tablet (50 mg total) by mouth at bedtime.   No facility-administered encounter medications on file as of 05/03/2024.    Allergies (verified) Patient has no known allergies.   History: Past Medical History:  Diagnosis Date   Allergy    Anemia    Anxiety    Arthritis    Chronic venous insufficiency 12/04/2015   Depression    Fibromyalgia     History of chicken pox    HTN (hypertension)    MRSA (methicillin resistant Staphylococcus aureus)    Obese    Plantar fasciitis    Pneumonia    Seasonal allergies    Sleep apnea 2012   took test in [place]-told to use cpap-could not ude   Past Surgical History:  Procedure Laterality Date   ANTERIOR CERVICAL DECOMP/DISCECTOMY FUSION  05/09/2008   C5-6, C6-7; with arthrodesis also   GASTRIC BYPASS  10/24/2000   KNEE ARTHROSCOPY  10/24/2002   rt and lt knee   ROTATOR CUFF REPAIR W/ DISTAL CLAVICLE EXCISION  01/06/2006   right   SHOULDER ARTHROSCOPY Left 10/24/2001   TONSILLECTOMY     TOTAL KNEE ARTHROPLASTY  03/29/2004   left   TOTAL KNEE ARTHROPLASTY  11/07/2003   right   TOTAL KNEE REVISION  07/30/2010   right; with lateral release   UPPER GASTROINTESTINAL ENDOSCOPY     Family History  Problem Relation Age of Onset   Heart disease Mother    COPD Mother        smoker   Bladder Cancer Father    Heart attack Brother    Heart disease Maternal Grandfather    Alcoholism Maternal Grandfather    Diabetes Paternal Grandfather    Colon cancer Other    Esophageal cancer Neg Hx    Rectal cancer Neg Hx    Stomach cancer Neg Hx    Social History   Socioeconomic History   Marital status: Married    Spouse name: Not on file   Number of children: 0   Years of education: Not on file   Highest education level: Not on file  Occupational History   Occupation: retired school system  Tobacco Use   Smoking status: Former    Current packs/day: 0.00    Types: Cigarettes    Quit date: 05/16/1981    Years since quitting: 42.9   Smokeless tobacco: Never  Vaping Use   Vaping status: Never Used  Substance and Sexual Activity   Alcohol use: Yes    Alcohol/week: 28.0 standard drinks of alcohol    Types: 28 Cans of beer per week    Comment: 2-4 per day   Drug use: No   Sexual activity: Not Currently    Partners: Male    Birth control/protection: Post-menopausal    Comment:  INTERCOURSE AGE 68, SEXUAL PARTNERS MORE THAN 5  Other Topics Concern   Not on file  Social History Narrative   Pt lives at home with husband, retired IT sales professional. They are caretakers to a 67 yo nonverbal autistic woman.    Social Drivers of Health   Financial Resource Strain: Low Risk  (05/03/2024)   Overall Financial Resource Strain (CARDIA)    Difficulty  of Paying Living Expenses: Not hard at all  Food Insecurity: No Food Insecurity (05/03/2024)   Hunger Vital Sign    Worried About Running Out of Food in the Last Year: Never true    Ran Out of Food in the Last Year: Never true  Transportation Needs: No Transportation Needs (05/03/2024)   PRAPARE - Administrator, Civil Service (Medical): No    Lack of Transportation (Non-Medical): No  Physical Activity: Inactive (05/03/2024)   Exercise Vital Sign    Days of Exercise per Week: 0 days    Minutes of Exercise per Session: 0 min  Stress: No Stress Concern Present (05/03/2024)   [employer] of Occupational Health - Occupational Stress Questionnaire    Feeling of Stress: Not at all  Social Connections: Moderately Integrated (05/03/2024)   Social Connection and Isolation Panel    Frequency of Communication with Friends and Family: More than three times a week    Frequency of Social Gatherings with Friends and Family: More than three times a week    Attends Religious Services: More than 4 times per year    Active Member of [employer] or Organizations: No    Attends Engineer, structural: Never    Marital Status: Married    Tobacco Counseling Counseling given: Not Answered    Clinical Intake:  Pre-visit preparation completed: Yes  Pain : 0-10 Pain Score: 4  Pain Type: Chronic pain Pain Location: Knee (both knees) Pain Descriptors / Indicators: Aching Pain Onset: More than a month ago Pain Frequency: Constant Pain Relieving Factors: medications, injections Effect of Pain on Daily Activities: cannot walk and  exercise  Pain Relieving Factors: medications, injections  BMI - recorded: 39.38 Nutritional Status: BMI > 30  Obese Nutritional Risks: None Diabetes: No  Lab Results  Component Value Date   HGBA1C 5.6 08/08/2023   HGBA1C 5.4 03/08/2022   HGBA1C 5.2 02/14/2019     How often do you need to have someone help you when you read instructions, pamphlets, or other written materials from your doctor or pharmacy?: 1 - Never  Interpreter Needed?: No  Information entered by :: B.Austine Kelsay,LPN   Activities of Daily Living     05/03/2024    2:05 PM  In your present state of health, do you have any difficulty performing the following activities:  Hearing? 0  Vision? 0  Difficulty concentrating or making decisions? 0  Walking or climbing stairs? 0  Dressing or bathing? 0  Doing errands, shopping? 0  Preparing Food and eating ? N  Using the Toilet? N  In the past six months, have you accidently leaked urine? Y  Do you have problems with loss of bowel control? N  Managing your Medications? N  Managing your Finances? N  Housekeeping or managing your Housekeeping? N    Patient Care Team: Vincente Shivers, NP as PCP - General (General Practice) Yvone Rush, MD as Consulting Physician (Orthopedic Surgery) Everlean Fallow, MD as Consulting Physician (Rheumatology) Maurice Loving, MD as Referring Physician (Psychiatry)  I have updated your Care Teams any recent Medical Services you may have received from other providers in the past year.     Assessment:   This is a routine wellness examination for Keven.  Hearing/Vision screen Hearing Screening - Comments:: Pt says she her hearing is good Vision Screening - Comments:: Pt says her vision is good Next week vision appt at [facility]   Goals Addressed  This Visit's Progress    Weight (lb) < 200 lb (90.7 kg)   244 lb (110.7 kg)    I want to walk more       Depression Screen     05/03/2024    1:59 PM 04/10/2024    12:24 PM 03/11/2024   11:41 AM 12/28/2023   12:25 PM 02/14/2022   12:11 PM 08/31/2020    2:50 PM 06/03/2020    3:21 PM  PHQ 2/9 Scores  PHQ - 2 Score 2 2 2  0 3 0 0  PHQ- 9 Score 4 6 9  0 6      Fall Risk     05/03/2024    1:54 PM 04/10/2024   12:24 PM 03/11/2024   11:41 AM 12/28/2023   12:25 PM 02/14/2022   12:11 PM  Fall Risk   Falls in the past year? 0 0 1 0 1  Number falls in past yr: 0 0 0 0 0  Injury with Fall? 0 0 0 0 1  Risk for fall due to : No Fall Risks No Fall Risks No Fall Risks No Fall Risks History of fall(s)  Follow up Education provided;Falls prevention discussed Falls evaluation completed Falls evaluation completed Falls evaluation completed Falls evaluation completed      Data saved with a previous flowsheet row definition    MEDICARE RISK AT HOME:  Medicare Risk at Home Any stairs in or around the home?: Yes If so, are there any without handrails?: Yes Home free of loose throw rugs in walkways, pet beds, electrical cords, etc?: Yes Adequate lighting in your home to reduce risk of falls?: Yes Life alert?: No Use of a cane, walker or w/c?: No Grab bars in the bathroom?: Yes Shower chair or bench in shower?: No Elevated toilet seat or a handicapped toilet?: Yes  TIMED UP AND GO:  Was the test performed?  No  Cognitive Function: 6CIT completed        05/03/2024    2:08 PM  6CIT Screen  What Year? 0 points  What month? 0 points  What time? 0 points  Count back from 20 0 points  Months in reverse 0 points  Repeat phrase 0 points  Total Score 0 points    Immunizations Immunization History  Administered Date(s) Administered   Fluad Quad(high Dose 65+) 08/01/2022   Influenza, High Dose Seasonal PF 08/08/2023   Influenza, Quadrivalent, Recombinant, Inj, Pf 07/25/2019   Influenza,inj,Quad PF,6+ Mos 09/23/2014, 09/20/2017   Influenza-Unspecified 11/16/2015, 08/28/2020   PFIZER Comirnaty(Gray Top)Covid-19 Tri-Sucrose Vaccine 08/24/2020   PFIZER(Purple  Top)SARS-COV-2 Vaccination 11/28/2019, 12/19/2019   PNEUMOCOCCAL CONJUGATE-20 03/11/2024   Zoster Recombinant(Shingrix) 08/11/2021, 02/22/2022    Screening Tests Health Maintenance  Topic Date Due   DTaP/Tdap/Td (1 - Tdap) Never done   COVID-19 Vaccine (4 - 2024-25 season) 01/13/2028 (Originally 06/25/2023)   INFLUENZA VACCINE  05/24/2024   Medicare Annual Wellness (AWV)  05/03/2025   MAMMOGRAM  09/18/2025   Colonoscopy  07/17/2031   Pneumococcal Vaccine: 50+ Years  Completed   DEXA SCAN  Completed   Hepatitis C Screening  Completed   Zoster Vaccines- Shingrix  Completed   Hepatitis B Vaccines  Aged Out   HPV VACCINES  Aged Out   Meningococcal B Vaccine  Aged Out    Health Maintenance  Health Maintenance Due  Topic Date Due   DTaP/Tdap/Td (1 - Tdap) Never done   Health Maintenance Items Addressed: None needed at this time  Additional Screening:  Vision Screening:  Recommended annual ophthalmology exams for early detection of glaucoma and other disorders of the eye. Would you like a referral to an eye doctor? No    Dental Screening: Recommended annual dental exams for proper oral hygiene  Community Resource Referral / Chronic Care Management: CRR required this visit?  No   CCM required this visit?  No   Plan:    I have personally reviewed and noted the following in the patient's chart:   Medical and social history Use of alcohol, tobacco or illicit drugs  Current medications and supplements including opioid prescriptions. Patient is currently taking opioid prescriptions. Information provided to patient regarding non-opioid alternatives. Patient advised to discuss non-opioid treatment plan with their provider. Functional ability and status Nutritional status Physical activity Advanced directives List of other physicians Hospitalizations, surgeries, and ER visits in previous 12 months Vitals Screenings to include cognitive, depression, and falls Referrals and  appointments  In addition, I have reviewed and discussed with patient certain preventive protocols, quality metrics, and best practice recommendations. A written personalized care plan for preventive services as well as general preventive health recommendations were provided to patient.   Erminio LITTIE Saris, LPN   [id number]/88/7974   After Visit Summary: (Declined) Due to this being a telephonic visit, with patients personalized plan was offered to patient but patient Declined AVS at this time   Notes: Nothing significant to report at this time.

## 2024-05-03 NOTE — Patient Instructions (Signed)
 Ms. [name] , Thank you for taking time out of your busy schedule to complete your Annual Wellness Visit with me. I enjoyed our conversation and look forward to speaking with you again next year. I, as well as your care team,  appreciate your ongoing commitment to your health goals. Please review the following plan we discussed and let me know if I can assist you in the future. Your Game plan/ To Do List     Follow up Visits: Next Medicare AWV with our clinical staff: 05/06/25 @ 1:40pm   Have you seen your provider in the last 6 months (3 months if uncontrolled diabetes)? Yes Next Office Visit with your provider: 06/03/24  Clinician Recommendations:  Aim for 30 minutes of exercise or brisk walking, 6-8 glasses of water, and 5 servings of fruits and vegetables each day.       This is a list of the screening recommended for you and due dates:  Health Maintenance  Topic Date Due   DTaP/Tdap/Td vaccine (1 - Tdap) Never done   COVID-19 Vaccine (4 - 2024-25 season) 01/13/2028*   Flu Shot  05/24/2024   Medicare Annual Wellness Visit  05/03/2025   Mammogram  09/18/2025   Colon Cancer Screening  07/17/2031   Pneumococcal Vaccine for age over 29  Completed   DEXA scan (bone density measurement)  Completed   Hepatitis C Screening  Completed   Zoster (Shingles) Vaccine  Completed   Hepatitis B Vaccine  Aged Out   HPV Vaccine  Aged Out   Meningitis B Vaccine  Aged Out  *Topic was postponed. The date shown is not the original due date.    Advanced directives: (Declined) Advance directive discussed with you today. Even though you declined this today, please call our office should you change your mind, and we can give you the proper paperwork for you to fill out. Advance Care Planning is important because it:  [x]  Makes sure you receive the medical care that is consistent with your values, goals, and preferences  [x]  It provides guidance to your family and loved ones and reduces their decisional  burden about whether or not they are making the right decisions based on your wishes.  Follow the link provided in your after visit summary or read over the paperwork we have mailed to you to help you started getting your Advance Directives in place. If you need assistance in completing these, please reach out to us  so that we can help you!

## 2024-05-06 ENCOUNTER — Ambulatory Visit: Admitting: Occupational Therapy

## 2024-05-09 ENCOUNTER — Other Ambulatory Visit (HOSPITAL_COMMUNITY): Payer: Self-pay

## 2024-05-09 ENCOUNTER — Ambulatory Visit: Admitting: Occupational Therapy

## 2024-05-10 ENCOUNTER — Other Ambulatory Visit (HOSPITAL_COMMUNITY): Payer: Self-pay

## 2024-05-13 ENCOUNTER — Ambulatory Visit: Admitting: Occupational Therapy

## 2024-05-14 ENCOUNTER — Telehealth: Payer: Self-pay

## 2024-05-14 NOTE — Telephone Encounter (Signed)
 Copied from CRM [id number]. Topic: General - Other >> May 14, 2024 10:29 AM Lavanda D wrote: Reason for CRM: Patient would like to let Carrol know that she has submitted to her insurance to cover the Zepbound  and they may be contacting her, it has not been filled yet since it is 995$. She said they may approve Ozempic.

## 2024-05-15 NOTE — Telephone Encounter (Unsigned)
 Copied from CRM [id number]. Topic: Clinical - Medication Prior Auth >> May 14, 2024  1:24 PM Mesmerise C wrote: Reason for CRM: Sonu from [employer] advised they received PA for Zepbound  12.5mg  but needing clinical information from office can be called back at [phone] option 2 >> May 14, 2024  3:11 PM Thersia C wrote: Sonu called back regarding prior auth for the prescription ozempic would like clinic information for this, would like a callback

## 2024-05-16 ENCOUNTER — Ambulatory Visit: Admitting: Occupational Therapy

## 2024-05-16 DIAGNOSIS — I890 Lymphedema, not elsewhere classified: Secondary | ICD-10-CM | POA: Diagnosis not present

## 2024-05-16 NOTE — Therapy (Signed)
 OUTPATIENT OCCUPATIONAL THERAPY TREATMENT NOTE    BILATERAL LOWER EXTREMITY LYMPHEDEMA Patient Name: [name] MRN: [id number] DOB:[birth year], 68 y.o., female Today's Date: 05/16/2024  REPORTING PERIOD:   END OF SESSION:   OT End of Session - 05/16/24 1405     Visit Number 39    Number of Visits 72    Date for OT Re-Evaluation 06/23/24    OT Start Time 0200    OT Stop Time 0301    OT Time Calculation (min) 61 min    Activity Tolerance Patient tolerated treatment well;No increased pain    Behavior During Therapy WFL for tasks assessed/performed          Past Medical History:  Diagnosis Date   Allergy    Anemia    Anxiety    Arthritis    Chronic venous insufficiency 12/04/2015   Depression    Fibromyalgia    History of chicken pox    HTN (hypertension)    MRSA (methicillin resistant Staphylococcus aureus)    Obese    Plantar fasciitis    Pneumonia    Seasonal allergies    Sleep apnea 2012   took test in [place]-told to use cpap-could not ude   Past Surgical History:  Procedure Laterality Date   ANTERIOR CERVICAL DECOMP/DISCECTOMY FUSION  05/09/2008   C5-6, C6-7; with arthrodesis also   GASTRIC BYPASS  10/24/2000   KNEE ARTHROSCOPY  10/24/2002   rt and lt knee   ROTATOR CUFF REPAIR W/ DISTAL CLAVICLE EXCISION  01/06/2006   right   SHOULDER ARTHROSCOPY Left 10/24/2001   TONSILLECTOMY     TOTAL KNEE ARTHROPLASTY  03/29/2004   left   TOTAL KNEE ARTHROPLASTY  11/07/2003   right   TOTAL KNEE REVISION  07/30/2010   right; with lateral release   UPPER GASTROINTESTINAL ENDOSCOPY     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 I don't know when it started.  + family hx of leg swelling-mother  SUBJECTIVE:  SUBJECTIVE STATEMENT: [name] returns to OT to continue Intensive Phase CDT to LLE. Pt missed last 2 scheduled visits. She was last seen on 05/02/24. Pt presents without compression wraps in place. Pt reports LE related leg pain as 0/10. OT informs Pt that garments have been ordered by DME vendor.   PAIN:  Are you having LE related pain? No: NPRS scale: 3/10 Pain location: feet Pain description: burning Aggravating factors: walking Relieving factors: elevation  PRECAUTIONS: Other: LYMPHEDEMA PRECAUTIONS  WEIGHT BEARING RESTRICTIONS: No  FALLS:  Has patient fallen in last 6 months? 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   OBJECTIVE: Note: Objective measures were completed at Evaluation unless otherwise noted.  COGNITION:  Overall cognitive status: frequent redirection needed during eval to stay on topic   POSTURE: WFL  LE ROM: grossly WFL  STRENGTH: grossly WFL   BLE COMPARATIVE LIMB VOLUMETRICS INITIAL 11/01/23  LANDMARK RIGHT  (dominant)  R LEG (A-D) 4296.6 ml  R THIGH (E-G) ml  R FULL LIMB (A-G) ml  Limb Volume differential (LVD)  %  Volume change since initial %  Volume change overall V  (Blank rows = not tested)  LANDMARK LEFT    L LEG (A-D) 4829.1 ml  L  THIGH (E-G) ml  L FULL LIMB (A-G) ml  Limb Volume differential (LVD)   12.4%, L>R  Volume change since initial %  Volume change overall %  (Blank rows = not tested)    9th Visit 12/06/23        LANDMARK LEFT    L LEG (A-D) 4888.8 ml  L  THIGH (E-G) ml  L FULL LIMB (A-G) ml  Limb Volume differential (LVD)     Volume change since initial  + 1.2 %  Volume change overall %  (Blank rows = not tested)    15 th Visit 01/10/24       LANDMARK LEFT    L LEG (A-D) 4637.8 ml  L  THIGH (E-G) ml  L FULL LIMB (A-G) ml  Limb Volume differential (LVD)     Volume change since last measured 12/06/23  DECREASED by 5.14%  Volume change overall %  (Blank rows = not tested)   20 th Visit- BLE COMPARATIVE LIMB VOLUMETRICS  02/01/24   LANDMARK LEFT    L LEG (A-D) 4139.2 ml  L  THIGH (E-G) ml  L FULL LIMB (A-G) ml  Limb Volume differential (LVD)     Volume change since initial DECREASED 10.8 %  Volume change overall %  (Blank rows = not tested)    30 th Visit- BLE COMPARATIVE LIMB VOLUMETRICS  03/25/24   LANDMARK LEFT    L LEG (A-D) 4173.5 ml  L  THIGH (E-G) ml  L FULL LIMB (A-G) ml  Limb Volume differential (LVD)     Volume change since LAST MEASURED ON 02/01/24  Inc BY 0.8 %  Volume change overall %  (Blank rows = not tested)    39 th Visit- BLE  COMPARATIVE LIMB  VOLUMETRICS  05/16/24   LANDMARK LEFT    L LEG (A-D) 4535.0 ml  L  THIGH (E-G) ml  L FULL LIMB (A-G) ml  Limb Volume differential (LVD)     Volume change since LAST MEASURED ON 03/25/24 INCREASED 8.7%  Volume change overall DECREASED 6.1% %  (Blank rows = not tested)   Mild, Stage  II, 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 Obesity  Skin  Description Hyper-Keratosis Peau' de Orange Shiny Tight Fibrotic/ Indurated Fatty Doughy Spongy/ boggy   x    Bilateral Chronic Lipo dermatosclerosis Hardened, thickened, leathery      Skin dry Flaky WNL Macerated   mildly x     Color Redness Varicosities Blanching Hemosiderin Stain Mottled   x x x  Severe bilaterally x   Odor Malodorous Yeast Fungal infection  WNL      x   Temperature Warm Cool wnl    x     Pitting Edema   1+ 2+ 3+ 4+ Non-pitting         x   Girth Symmetrical Asymmetrical                   Distribution    L>R toes to popliteal     Stemmer Sign Positive Negative   +    Lymphorrhea History Of:  Present Absent     x    Wounds History Of Present Absent Venous Arterial Pressure Sheer     x        Signs of Infection Redness Warmth Erythema Acute Swelling Drainage Borders                    Sensation Light Touch Deep pressure Hypersensitivity Neuropathic pain   In Tact Impaired In tact Impaired Absent Impaired Plantar surfaces bilaterally   x  x  x      Nails WNL   Fungus nail dystrophy     x   Hair Growth Symmetrical Asymmetrical   x    Skin Creases Base of toes  Ankles   Base of Fingers knees       Abdominal pannus Thigh Lobules  Face/neck   x           TODAY'S TREATMENT:                                                                                                                                         LLE/LLQ MLD w Simultaneous skin L LEG multilayer gradient compression wraps  PATIENT EDUCATION:  Continued Pt/ CG edu for lymphedema self care home program  throughout session. Topics include outcome of comparative limb volumetrics- starting limb volume differentials (LVDs), technology and gradient techniques used for short stretch, multilayer compression wrapping, simple self-MLD, therapeutic lymphatic pumping exercises, skin/nail care, LE precautions,. compression garment recommendations and specifications, wear and care schedule and compression garment donning / doffing  w assistive devices. Discussed progress towards all OT goals since commencing CDT. All questions answered to the Pt's satisfaction. Good return. Person educated: Patient  Education method: Explanation, Demonstration, and Handouts Education comprehension: verbalized understanding, returned demonstration, verbal cues required, and needs further education  HOME EXERCISE PROGRAM: BLE lymphatic pumping there ex using- 1 sets of 10 reps, each exercise in order-  1-2 x daily, bilaterally Simple self MLD 1 x daily Daily skin care to increase hydration, skin mobility and decrease infection risk- can be done during MLD Compression wraps 23/7 until garment fitting complete   ASSESSMENT  CLINICAL IMPRESSION: LLE comparative limb volumetrics reveal LLE volumetric increase  measuring 8.7% since last measured on 03/25/24. Overall the limb volume is decreased by 6.1%. Continued LLE/LLQ MLD as established. Provided simultaneous skin care w castor oil to improve hydration. Reapplied compression wraps. Pt encouraged to call DME vendor to check status of LLE garment order. Cont as per POC.  (Initial Eval 09/13/23: Nilaya Bouie is a 68 y.o. 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 Pt denies hx of known, chronic limb swelling in her family. She does not  utilize compression garments, or a vasopneumatic compression pump as conservative measures. Chronic limb swelling and associated pain limits functional performance in all occupational domains, including functional ambulation and mobility, self-care and basic and instrumental ADLs. It limits participation in leisure pursuits, productive activities and social participle[ation.   Pt will benefit from  skilled Occupational Therapy for both  Intensive and Self -management phases of Complete Decongestive Therapy (CDT) to reduce limb swelling and associated pain, to limit progression, to reduce infection risk , and to increase functional performance, social participation and quality of life. CDT include manual lymphatic drainage (MLD), skin care, therapeutic exercise and compression therapies- multilayer bandaging initially, one limb at a time,  and then appropriate compression garments, or alternatives in the final phase. Pt and caregivers who assist are trained to perform a;; aspects of Lymphedema self-care top ensure optimal self management over time. Without skilled  OT for CDT, further progression of lymphedema is certain and further functional decline is expected.)   OBJECTIVE IMPAIRMENTS: decreased activity tolerance, decreased knowledge of condition, decreased knowledge of use of DME, increased edema, pain, and chronic leg swelling.   ACTIVITY LIMITATIONS: limited functional ambulation and functional mobility 2/2 lymphedema -related foot and leg pain; standing, squatting, stairs, bathing, toileting, dressing, and hygiene/grooming  PARTICIPATION LIMITATIONS: meal prep, cooking, cleaning, driving, shopping, community activity, and volunteering  PERSONAL FACTORS: Time since onset of injury/illness/exacerbation are also affecting patient's functional outcome.   REHAB POTENTIAL: Good  CLINICAL DECISION MAKING: Evolving/moderate complexity  EVALUATION COMPLEXITY: Moderate   GOALS: Goals reviewed  with patient? Yes  SHORT TERM GOALS: Target date: 4th OT Rx visit   Pt will demonstrate understanding of lymphedema precautions and prevention strategies with modified independence using a printed reference to identify at least 5 precautions and discussing how s/he may implement them into daily life to reduce risk of progression with extra time. Baseline:Max A Goal status: 11/20/23 GOAL MET  2.  Pt will be able to apply multilayer, knee length, gradient, compression wraps to one leg at a time with max caregiver assistance to decrease limb volume, to limit infection risk, and to limit lymphedema progression.  Baseline: Dependent Goal status:  11/13/23 GOAL MET  LONG TERM GOALS: Target date: 12/12/22  Given this patient's Intake score of TBD % on the functional outcomes FOTO tool, patient will experience an increase in function of 5 points to improve basic and instrumental ADLs performance, including lymphedema self-care.  Baseline: Max A Goal status: GOAL DEFERRED. FOTO tool discontinued.  2.  Given this patient's Intake score of 64.71 on the Lymphedema Life Impact Scale (LLIS), patient will experience a reduction of at least 5 points in her perceived level of functional impairment resulting from lymphedema to improve functional performance and quality of life (QOL). Baseline: 64.71% Goal status: PROGRESSING  3.  Pt will achieve at least a 10% volume reduction in B legs to return limb to typical size and shape, to limit infection risk and LE progression, to decrease pain, to improve function. Baseline: Dependent Goal status: PARTIALLY MET:  LLE comparative limb volumetrics on 05/16/24 reveal LLE volumetric increase  measuring 8.7% since last measured on 03/25/24. Overall the limb volume is decreased by 6.1%.  4.  Pt will obtain proper compression garments/devices and achieve modified independence (extra time + assistive devices) with donning/doffing to optimize limb volume reductions and limit LE  progression over time. Baseline:  Goal status: PROGRESSING. OT ordered LLE custom garment on 02/07/24. Process has been delayed by MD not returning signed script to DME vendor. PLAN:  OT FREQUENCY: 2x/week  OT DURATION: 12 weeks  PLANNED INTERVENTIONS: 97110-Therapeutic exercises, 97530- Therapeutic activity, 97535- Self Care, [id number]- Manual therapy, Patient/Family education, Manual lymph drainage, Compression bandaging, DME instructions, and fitting with compression garments  Custom-made gradient compression garments and HOS devices are medically necessary because they are uniquely sized and shaped to fit the exact dimensions of the affected extremities, and to provide appropriate medical grade, graduated compression essential for optimally managing chronic, progressive lymphedema. Multiple custom compression garments are needed to ensure proper hygiene to limit infection risk. Custom compression garments should be replaced q 3-6 months When worn consistently for optimal lipo-lymphedema self-management over time. HOS devices, medically necessary to limit fibrosis buildup in tissue, should be replaced q 2 years and PRN when worn out.     Pt should be fit:  3 Left and 3 Right, custom, Jobst, flat knit, Elvarex Classic, ccl 2 (23-32 mmHg) knee length compression stockings with open toes to limit and control chronic leg swelling, to improve lymphatic circulation, to reduce infection risk, to limit formation of tissue fibrosis and to limit progression of lymphedema over time. Custom compression garments should be replaced q 3-6 months when worn consistently for optimal lymphedema self-management.   Pt should be fit with 1 R and 1 L custom, Jobst Relax, ccl 2, convoluted Hours-of-Sleep (HOS)  compression device to counteract fluid accumulation and fibrosis formation leading to lymphedema progression. at night. HOS devices, medically necessary to limit fibrosis buildup in tissue, should be replaced q 2  years and PRN when worn out.    PLAN FOR NEXT SESSION:  LLE MLD as established using functional inguinal LN and deep abdominal lymphatics. Multilayer compression bandaging to L leg using 1 each 8, 10 and 12 cm wide x 5 meters long short stretch wrap over single layer of rosidal foam and cotton stockinett using gradient techniques. Pt instructed to remove wraps and report symptoms to her doctor if she experiences acute pain in the limb, signs/symptoms of infection, or atypical sob.  Pt / family edu for LE self care: simple self MLD leg sequence  Zebedee Dec, MS, OTR/L, CLT-LANA 05/16/24  3:02 PM  OUTPATIENT OCCUPATIONAL THERAPY TREATMENT NOTE    BILATERAL LOWER EXTREMITY LYMPHEDEMA Patient Name: [name] MRN: [id number] DOB:08/18/1956, 68 y.o., female Today's Date: 05/16/2024  REPORTING PERIOD:   END OF SESSION:   OT End of Session - 05/16/24 1405     Visit Number 39    Number of Visits 72    Date for OT Re-Evaluation 06/23/24    OT Start Time 0200    OT Stop Time 0301    OT Time Calculation (min) 61 min    Activity Tolerance Patient tolerated treatment well;No increased pain    Behavior During Therapy WFL for tasks assessed/performed          Past Medical History:  Diagnosis Date   Allergy    Anemia    Anxiety    Arthritis    Chronic venous insufficiency 12/04/2015   Depression    Fibromyalgia    History of chicken pox    HTN (hypertension)    MRSA (methicillin resistant Staphylococcus aureus)    Obese    Plantar fasciitis    Pneumonia    Seasonal allergies    Sleep apnea 2012   took test in [place]-told to use cpap-could not ude   Past Surgical History:  Procedure Laterality Date   ANTERIOR CERVICAL DECOMP/DISCECTOMY FUSION  05/09/2008   C5-6, C6-7; with arthrodesis also   GASTRIC BYPASS  10/24/2000   KNEE ARTHROSCOPY  10/24/2002   rt and lt knee   ROTATOR CUFF REPAIR W/ DISTAL CLAVICLE EXCISION  01/06/2006   right   SHOULDER ARTHROSCOPY Left  10/24/2001   TONSILLECTOMY     TOTAL KNEE ARTHROPLASTY  03/29/2004   left   TOTAL KNEE ARTHROPLASTY  11/07/2003   right   TOTAL KNEE REVISION  07/30/2010   right; with lateral release   UPPER GASTROINTESTINAL ENDOSCOPY     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 I don't know when it started.  + family hx of leg swelling-mother  SUBJECTIVE:  SUBJECTIVE STATEMENT: [name] returns to OT to continue Intensive Phase CDT to LLE. Pt presents without compression wraps in place. Pt reports LE related leg pain as 3/10. She tells me she has not been wrapped since Saturday night. She states she was supposed to go without compression at least 1-2 x weekly. I thought I read that somewhere.  PAIN:  Are you having LE related pain? No: NPRS scale: 3/10 Pain location: feet Pain description: burning Aggravating factors:  walking Relieving factors: elevation  PRECAUTIONS: Other: LYMPHEDEMA PRECAUTIONS  WEIGHT BEARING RESTRICTIONS: No  FALLS:  Has patient fallen in last 6 months? 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   OBJECTIVE: Note: Objective measures were completed at Evaluation unless otherwise noted.  COGNITION:  Overall cognitive status: frequent redirection needed during eval to stay on topic   POSTURE: WFL  LE ROM: grossly WFL  STRENGTH: grossly WFL   BLE COMPARATIVE LIMB VOLUMETRICS INITIAL 11/01/23  LANDMARK RIGHT  (dominant)  R LEG (A-D) 4296.6 ml  R THIGH (E-G) ml  R FULL LIMB (A-G) ml  Limb Volume differential (LVD)  %  Volume change since initial %  Volume change overall V  (Blank rows = not tested)  LANDMARK LEFT    L LEG (A-D) 4829.1 ml  L  THIGH (E-G) ml  L FULL LIMB (A-G) ml  Limb Volume differential (LVD)   12.4%, L>R  Volume change since initial %  Volume change overall %  (Blank rows = not tested)    9th Visit 12/06/23        LANDMARK LEFT    L LEG (A-D) 4888.8 ml  L  THIGH (E-G) ml  L FULL LIMB (A-G) ml  Limb Volume differential (LVD)     Volume change since initial  + 1.2 %  Volume change overall %  (Blank rows = not tested)    15 th Visit 01/10/24       LANDMARK LEFT    L LEG (A-D) 4637.8 ml  L  THIGH (E-G) ml  L FULL LIMB (A-G) ml  Limb Volume differential (LVD)     Volume change since last measured 12/06/23  DECREASED by 5.14%   Volume change overall %  (Blank rows = not tested)   20 th Visit- BLE COMPARATIVE LIMB VOLUMETRICS  02/01/24   LANDMARK LEFT    L LEG (A-D) 4139.2 ml  L  THIGH (E-G) ml  L FULL LIMB (A-G) ml  Limb Volume differential (LVD)     Volume change since initial DECREASED 10.8 %  Volume change overall %  (Blank rows = not tested)    30 th Visit- BLE COMPARATIVE LIMB VOLUMETRICS  03/25/24   LANDMARK LEFT    L LEG (A-D) 4173.5 ml  L  THIGH (E-G) ml  L FULL LIMB (A-G) ml  Limb Volume differential (LVD)     Volume change since LAST MEASURED ON 02/01/24  Inc BY 0.8 %  Volume change overall %  (Blank rows = not tested)  Mild, Stage  II, 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 Obesity  Skin  Description Hyper-Keratosis Peau' de Orange Shiny Tight Fibrotic/ Indurated Fatty Doughy Spongy/ boggy   x    Bilateral Chronic Lipo dermatosclerosis Hardened, thickened, leathery      Skin dry Flaky WNL Macerated   mildly x     Color Redness Varicosities Blanching Hemosiderin Stain Mottled   x x x  Severe bilaterally x   Odor Malodorous Yeast Fungal infection  WNL      x   Temperature Warm Cool wnl    x     Pitting Edema   1+ 2+ 3+ 4+ Non-pitting         x   Girth Symmetrical Asymmetrical                   Distribution    L>R toes to popliteal     Stemmer Sign Positive Negative   +    Lymphorrhea History Of:  Present Absent     x    Wounds History Of Present Absent Venous Arterial Pressure Sheer     x        Signs of Infection Redness Warmth Erythema Acute Swelling Drainage Borders                    Sensation Light Touch Deep pressure Hypersensitivity Neuropathic pain   In Tact Impaired In tact Impaired Absent Impaired Plantar surfaces bilaterally   x  x  x      Nails WNL   Fungus nail dystrophy     x   Hair Growth Symmetrical Asymmetrical   x    Skin Creases Base of toes  Ankles   Base of Fingers knees       Abdominal pannus Thigh Lobules   Face/neck   x           TODAY'S TREATMENT:                                                                                                                                         LLE/LLQ MLD w Simultaneous skin L LEG multilayer gradient compression wraps  PATIENT EDUCATION:  Continued Pt/ CG edu for lymphedema self care home program throughout session. Topics include outcome of comparative limb volumetrics- starting limb volume differentials (LVDs), technology and gradient techniques used for short stretch, multilayer compression wrapping, simple self-MLD, therapeutic lymphatic pumping exercises, skin/nail care, LE precautions,. compression garment recommendations and specifications, wear and care schedule and compression garment donning / doffing w assistive devices. Discussed progress towards all OT goals since commencing CDT. All questions answered to the Pt's satisfaction. Good return. Person educated: Patient  Education method: Explanation, Demonstration, and Handouts Education comprehension: verbalized understanding, returned demonstration, verbal cues required, and needs further education  HOME EXERCISE PROGRAM: BLE lymphatic pumping there ex using- 1 sets of 10 reps, each  exercise in order-  1-2 x daily, bilaterally Simple self MLD 1 x daily Daily skin care to increase hydration, skin mobility and decrease infection risk- can be done during MLD Compression wraps 23/7 until garment fitting complete   ASSESSMENT  CLINICAL IMPRESSION:  Continued LLE/LLQ MLD as established. Provided simultaneous skin care w castor oil to improve hydration. Reapplied compression wraps. Pt encouraged to call DME vendor to check status of LLE garment order. Cont as per POC.  (Initial Eval 09/13/23: [name] is a 68 y.o. 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 Pt denies hx of known, chronic limb swelling in her family. She does not utilize compression garments, or a vasopneumatic compression pump as conservative measures. Chronic limb swelling and associated pain limits functional performance in all occupational domains, including functional ambulation and mobility, self-care and basic and instrumental ADLs. It limits participation in leisure pursuits, productive activities and social participle[ation.   Pt will benefit from  skilled Occupational Therapy for both  Intensive and Self -management phases of Complete Decongestive Therapy (CDT) to reduce limb swelling and associated pain, to limit progression, to reduce infection risk , and to increase functional performance, social participation and quality of life. CDT include manual lymphatic drainage (MLD), skin care, therapeutic exercise and compression therapies- multilayer bandaging initially, one limb at a time,  and then appropriate compression garments, or alternatives in the final phase. Pt and caregivers who assist are trained to perform a;; aspects of Lymphedema self-care top ensure optimal self management over time. Without skilled  OT for CDT, further progression of lymphedema is certain and further functional decline is expected.)   OBJECTIVE IMPAIRMENTS: decreased activity tolerance, decreased knowledge of condition, decreased knowledge of use of DME, increased edema, pain, and chronic leg swelling.   ACTIVITY LIMITATIONS: limited functional ambulation and functional mobility 2/2 lymphedema -related foot and leg pain; standing, squatting, stairs, bathing, toileting, dressing, and hygiene/grooming  PARTICIPATION LIMITATIONS: meal prep, cooking, cleaning, driving, shopping, community activity, and volunteering  PERSONAL FACTORS: Time since onset of injury/illness/exacerbation are also  affecting patient's functional outcome.   REHAB POTENTIAL: Good  CLINICAL DECISION MAKING: Evolving/moderate complexity  EVALUATION COMPLEXITY: Moderate   GOALS: Goals reviewed with patient? Yes  SHORT TERM GOALS: Target date: 4th OT Rx visit   Pt will demonstrate understanding of lymphedema precautions and prevention strategies with modified independence using a printed reference to identify at least 5 precautions and discussing how s/he may implement them into daily life to reduce risk of progression with extra time. Baseline:Max A Goal status: 11/20/23 GOAL MET  2.  Pt will be able to apply multilayer, knee length, gradient, compression wraps to one leg at a time with max caregiver assistance to decrease limb volume, to limit infection risk, and to limit lymphedema progression.  Baseline: Dependent Goal status: 11/13/23 GOAL MET  LONG TERM GOALS: Target date: 12/12/22  Given this patient's Intake score of TBD % on the functional outcomes FOTO tool, patient will experience an increase in function of 5 points to improve basic and instrumental ADLs performance, including lymphedema self-care.  Baseline: Max A Goal status: GOAL DEFERRED. FOTO tool discontinued.  2.  Given this patient's Intake score of 64.71 on the Lymphedema Life Impact Scale (LLIS), patient will experience a reduction of at least 5 points  in her perceived level of functional impairment resulting from lymphedema to improve functional performance and quality of life (QOL). Baseline: 64.71% Goal status: PROGRESSING  3.  Pt will achieve at least a 10% volume reduction in B legs to return limb to typical size and shape, to limit infection risk and LE progression, to decrease pain, to improve function. Baseline: Dependent Goal status: PARTIALLY MET:  FOR L LEG with 10.8% REDUCTION.   4.  Pt will obtain proper compression garments/devices and achieve modified independence (extra time + assistive devices) with  donning/doffing to optimize limb volume reductions and limit LE progression over time. Baseline:  Goal status: PROGRESSING. OT ordered LLE custom garment on 02/07/24. Process has been delayed by MD not returning signed script to DME vendor. PLAN:  OT FREQUENCY: 2x/week  OT DURATION: 12 weeks  PLANNED INTERVENTIONS: 97110-Therapeutic exercises, 97530- Therapeutic activity, 97535- Self Care, [id number]- Manual therapy, Patient/Family education, Manual lymph drainage, Compression bandaging, DME instructions, and fitting with compression garments  Custom-made gradient compression garments and HOS devices are medically necessary because they are uniquely sized and shaped to fit the exact dimensions of the affected extremities, and to provide appropriate medical grade, graduated compression essential for optimally managing chronic, progressive lymphedema. Multiple custom compression garments are needed to ensure proper hygiene to limit infection risk. Custom compression garments should be replaced q 3-6 months When worn consistently for optimal lipo-lymphedema self-management over time. HOS devices, medically necessary to limit fibrosis buildup in tissue, should be replaced q 2 years and PRN when worn out.     Pt should be fit:  3 Left and 3 Right, custom, Jobst, flat knit, Elvarex Classic, ccl 2 (23-32 mmHg) knee length compression stockings with open toes to limit and control chronic leg swelling, to improve lymphatic circulation, to reduce infection risk, to limit formation of tissue fibrosis and to limit progression of lymphedema over time. Custom compression garments should be replaced q 3-6 months when worn consistently for optimal lymphedema self-management.   Pt should be fit with 1 R and 1 L custom, Jobst Relax, ccl 2, convoluted Hours-of-Sleep (HOS)  compression device to counteract fluid accumulation and fibrosis formation leading to lymphedema progression. at night. HOS devices, medically necessary  to limit fibrosis buildup in tissue, should be replaced q 2 years and PRN when worn out.    PLAN FOR NEXT SESSION:  LLE MLD as established using functional inguinal LN and deep abdominal lymphatics. Multilayer compression bandaging to L leg using 1 each 8, 10 and 12 cm wide x 5 meters long short stretch wrap over single layer of rosidal foam and cotton stockinett using gradient techniques. Pt instructed to remove wraps and report symptoms to her doctor if she experiences acute pain in the limb, signs/symptoms of infection, or atypical sob.  Pt / family edu for LE self care: simple self MLD leg sequence  Zebedee Dec, MS, OTR/L, CLT-LANA 05/16/24 3:02 PM

## 2024-05-17 NOTE — Telephone Encounter (Signed)
 Phone call to [employer] for PA for [employer] .  Completed clinical questions over the phone.  Representative requested that we send notes.  Carrol Aurora, NP visit on 04/07/24 and 03/11/24 sent to [phone] through [facility] routing.  We should expect to receive decision via fax.

## 2024-05-20 ENCOUNTER — Ambulatory Visit: Admitting: Occupational Therapy

## 2024-05-20 DIAGNOSIS — I890 Lymphedema, not elsewhere classified: Secondary | ICD-10-CM | POA: Diagnosis not present

## 2024-05-20 NOTE — Telephone Encounter (Signed)
 LMTCB about denial

## 2024-05-23 ENCOUNTER — Ambulatory Visit: Admitting: Occupational Therapy

## 2024-05-23 DIAGNOSIS — I890 Lymphedema, not elsewhere classified: Secondary | ICD-10-CM | POA: Diagnosis not present

## 2024-05-23 NOTE — Therapy (Signed)
 OUTPATIENT OCCUPATIONAL THERAPY TREATMENT NOTE AND PROGRESS REPORT   BILATERAL LOWER EXTREMITY LYMPHEDEMA Patient Name: [name] MRN: [id number] DOB:[birth year], 68 y.o., female Today's Date: 05/23/2024  REPORTING PERIOD: 03/28/24 - 05/23/24  END OF SESSION:   OT End of Session - 05/23/24 1551     Visit Number 40    Number of Visits 72    Date for OT Re-Evaluation 06/23/24    OT Start Time 0300    OT Stop Time 0350    OT Time Calculation (min) 50 min    Activity Tolerance Patient tolerated treatment well;No increased pain    Behavior During Therapy WFL for tasks assessed/performed          Past Medical History:  Diagnosis Date   Allergy    Anemia    Anxiety    Arthritis    Chronic venous insufficiency 12/04/2015   Depression    Fibromyalgia    History of chicken pox    HTN (hypertension)    MRSA (methicillin resistant Staphylococcus aureus)    Obese    Plantar fasciitis    Pneumonia    Seasonal allergies    Sleep apnea 2012   took test in [place]-told to use cpap-could not ude   Past Surgical History:  Procedure Laterality Date   ANTERIOR CERVICAL DECOMP/DISCECTOMY FUSION  05/09/2008   C5-6, C6-7; with arthrodesis also   GASTRIC BYPASS  10/24/2000   KNEE ARTHROSCOPY  10/24/2002   rt and lt knee   ROTATOR CUFF REPAIR W/ DISTAL CLAVICLE EXCISION  01/06/2006   right   SHOULDER ARTHROSCOPY Left 10/24/2001   TONSILLECTOMY     TOTAL KNEE ARTHROPLASTY  03/29/2004   left   TOTAL KNEE ARTHROPLASTY  11/07/2003   right   TOTAL KNEE REVISION  07/30/2010   right; with lateral release   UPPER GASTROINTESTINAL ENDOSCOPY     Patient Active Problem List   Diagnosis Date Noted   Venous stasis ulcer of other part of left lower leg limited to breakdown of skin with varicose veins (HCC) 12/28/2023   Stage 3a chronic kidney disease (HCC) 12/28/2023   Essential hypertension 02/14/2022   CAP (community acquired pneumonia) 06/09/2017   ADHD, 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 I don't know when it started.  + family hx of leg swelling-mother  SUBJECTIVE:  SUBJECTIVE STATEMENT: [name] returns to OT to continue Intensive Phase CDT to LLE. pT DENIES le RELATED LEG PAIN. Pt BRINGS custom compression garments to clinic. They arrived on Monday. PAIN:  Are you having LE related pain? No: NPRS scale: 0/10 Pain location: feet Pain description: burning Aggravating factors: walking Relieving factors: elevation  PRECAUTIONS: Other: LYMPHEDEMA PRECAUTIONS  WEIGHT BEARING RESTRICTIONS: No  FALLS:  Has patient fallen in last 6 months? 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   OBJECTIVE: Note: Objective measures were completed at Evaluation unless otherwise noted.  COGNITION:  Overall cognitive status: frequent redirection needed during eval to stay on topic   POSTURE: WFL  LE ROM: grossly WFL  STRENGTH: grossly WFL   BLE COMPARATIVE LIMB VOLUMETRICS INITIAL 11/01/23  LANDMARK RIGHT  (dominant)  R LEG (A-D) 4296.6 ml  R THIGH (E-G) ml  R FULL LIMB (A-G) ml  Limb Volume differential (LVD)  %  Volume change since initial %  Volume change overall V  (Blank rows = not tested)  LANDMARK LEFT    L LEG (A-D) 4829.1 ml  L  THIGH (E-G) ml  L FULL LIMB (A-G) ml  Limb Volume differential (LVD)   12.4%, L>R  Volume change since initial %  Volume change overall %  (Blank rows = not tested)    9th Visit 12/06/23        LANDMARK LEFT    L LEG (A-D) 4888.8 ml  L  THIGH (E-G) ml  L FULL LIMB (A-G) ml  Limb Volume differential (LVD)     Volume change since initial  + 1.2 %  Volume change overall %  (Blank rows = not tested)    15 th Visit 01/10/24       LANDMARK LEFT    L LEG (A-D) 4637.8 ml  L  THIGH (E-G) ml  L FULL LIMB (A-G) ml  Limb Volume differential (LVD)     Volume change since last measured 12/06/23  DECREASED by 5.14%  Volume change overall %  (Blank rows = not tested)   20 th Visit- BLE COMPARATIVE LIMB VOLUMETRICS  02/01/24   LANDMARK LEFT    L LEG (A-D) 4139.2 ml  L  THIGH (E-G) ml  L FULL LIMB (A-G) ml  Limb Volume differential (LVD)     Volume change since initial DECREASED 10.8 %  Volume change overall %  (Blank rows = not tested)    30 th Visit- BLE COMPARATIVE LIMB VOLUMETRICS  03/25/24   LANDMARK LEFT    L LEG (A-D) 4173.5 ml  L  THIGH (E-G) ml  L FULL LIMB (A-G) ml  Limb Volume differential (LVD)     Volume change since LAST MEASURED ON 02/01/24  Inc BY 0.8 %  Volume change overall %  (Blank rows = not tested)    39 th Visit- LLE COMPARATIVE LIMB VOLUMETRICS  05/16/24   LANDMARK LEFT    L LEG (A-D)  4535.0 ml  L  THIGH (E-G) ml  L  FULL LIMB (A-G) ml  Limb Volume differential (LVD)     Volume change since LAST MEASURED ON 03/25/24 INCREASED 8.7%  Volume change overall DECREASED 6.1%   (Blank rows = not tested)   Mild, Stage  II, 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 Obesity  Skin  Description Hyper-Keratosis Peau' de Orange Shiny Tight Fibrotic/ Indurated Fatty Doughy Spongy/ boggy   x    Bilateral Chronic Lipo dermatosclerosis Hardened, thickened, leathery      Skin dry Flaky WNL Macerated   mildly x     Color Redness Varicosities Blanching Hemosiderin Stain Mottled   x x x  Severe bilaterally x   Odor Malodorous Yeast Fungal infection  WNL      x   Temperature Warm Cool wnl    x     Pitting Edema   1+ 2+ 3+ 4+ Non-pitting         x   Girth Symmetrical Asymmetrical                   Distribution    L>R toes to popliteal     Stemmer Sign Positive Negative   +    Lymphorrhea History Of:  Present Absent     x    Wounds History Of Present Absent Venous Arterial Pressure Sheer     x        Signs of Infection Redness Warmth Erythema Acute Swelling Drainage Borders                    Sensation Light Touch Deep pressure Hypersensitivity Neuropathic pain   In Tact Impaired In tact Impaired Absent Impaired Plantar surfaces bilaterally   x  x  x      Nails WNL   Fungus nail dystrophy     x   Hair Growth Symmetrical Asymmetrical   x    Skin Creases Base of toes  Ankles   Base of Fingers knees       Abdominal pannus Thigh Lobules  Face/neck   x           TODAY'S TREATMENT:                                                                                                                                         Pt EDU FOR GARMENT DONNING AND DOFFING, WEAR AND CARE, ORDERING ROUTINE  PATIENT EDUCATION:  Continued Pt/ CG edu for lymphedema self care home program throughout session. Topics include outcome of comparative limb volumetrics-  starting limb volume differentials (LVDs), technology and gradient techniques used for short stretch, multilayer compression wrapping, simple self-MLD, therapeutic lymphatic pumping exercises, skin/nail care, LE precautions,. compression garment recommendations and specifications, wear and care schedule and compression garment donning / doffing w assistive devices. Discussed progress towards all OT goals since commencing CDT. All questions answered to the Pt's satisfaction. Good return. Person  educated: Patient  Education method: Programmer, multimedia, Demonstration, and Handouts Education comprehension: verbalized understanding, returned demonstration, verbal cues required, and needs further education  HOME EXERCISE PROGRAM: BLE lymphatic pumping there ex using- 1 sets of 10 reps, each exercise in order-  1-2 x daily, bilaterally Simple self MLD 1 x daily Daily skin care to increase hydration, skin mobility and decrease infection risk- can be done during MLD Compression wraps 23/7 until garment fitting complete   ASSESSMENT  CLINICAL IMPRESSION: LLE comparative limb volumetrics reveal LLE volumetric increase  measuring 8.7% since last measured on 03/25/24. Overall the limb volume is decreased by 6.1%. Ten percent volumetric goal for LLE not met. Please review GOALS section for detailed progress date on remaining goals. Completed initial fitting for custom L LEG Elvarex compression garment. Fit appears OK. Potential problem areas include circumference at top edge, length and length at toe opening. Pt will wear and wash garment during visit interval and well complete final assessment at next session. We commence RLE CDT next session.  (Initial Eval 09/13/23: [name] is a 68 y.o. 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 Pt also diagnosed  recently with idiopathic Periferal neuropathy of bilateral feet. Pt denies hx of known, chronic limb swelling in her family. She does not utilize compression garments, or a vasopneumatic compression pump as conservative measures. Chronic limb swelling and associated pain limits functional performance in all occupational domains, including functional ambulation and mobility, self-care and basic and instrumental ADLs. It limits participation in leisure pursuits, productive activities and social participle[ation.   Pt will benefit from  skilled Occupational Therapy for both  Intensive and Self -management phases of Complete Decongestive Therapy (CDT) to reduce limb swelling and associated pain, to limit progression, to reduce infection risk , and to increase functional performance, social participation and quality of life. CDT include manual lymphatic drainage (MLD), skin care, therapeutic exercise and compression therapies- multilayer bandaging initially, one limb at a time,  and then appropriate compression garments, or alternatives in the final phase. Pt and caregivers who assist are trained to perform a;; aspects of Lymphedema self-care top ensure optimal self management over time. Without skilled  OT for CDT, further progression of lymphedema is certain and further functional decline is expected.)   OBJECTIVE IMPAIRMENTS: decreased activity tolerance, decreased knowledge of condition, decreased knowledge of use of DME, increased edema, pain, and chronic leg swelling.   ACTIVITY LIMITATIONS: limited functional ambulation and functional mobility 2/2 lymphedema -related foot and leg pain; standing, squatting, stairs, bathing, toileting, dressing, and hygiene/grooming  PARTICIPATION LIMITATIONS: meal prep, cooking, cleaning, driving, shopping, community activity, and volunteering  PERSONAL FACTORS: Time since onset of injury/illness/exacerbation are also affecting patient's functional outcome.   REHAB  POTENTIAL: Good  CLINICAL DECISION MAKING: Evolving/moderate complexity  EVALUATION COMPLEXITY: Moderate   GOALS: Goals reviewed with patient? Yes  SHORT TERM GOALS: Target date: 4th OT Rx visit   Pt will demonstrate understanding of lymphedema precautions and prevention strategies with modified independence using a printed reference to identify at least 5 precautions and discussing how s/he may implement them into daily life to reduce risk of progression with extra time. Baseline:Max A Goal status: 11/20/23 GOAL MET  2.  Pt will be able to apply multilayer, knee length, gradient, compression wraps to one leg at a time with max caregiver assistance to decrease limb volume, to limit  infection risk, and to limit lymphedema progression.  Baseline: Dependent Goal status: 11/13/23 GOAL MET  LONG TERM GOALS: Target date: 12/12/22  Given this patient's Intake score of TBD % on the functional outcomes FOTO tool, patient will experience an increase in function of 5 points to improve basic and instrumental ADLs performance, including lymphedema self-care.  Baseline: Max A Goal status: GOAL DEFERRED. FOTO tool discontinued.  2.  Given this patient's Intake score of 64.71 on the Lymphedema Life Impact Scale (LLIS), patient will experience a reduction of at least 5 points in her perceived level of functional impairment resulting from lymphedema to improve functional performance and quality of life (QOL). Baseline: 64.71% Goal status: PROGRESSING  3.  Pt will achieve at least a 10% volume reduction in B legs to return limb to typical size and shape, to limit infection risk and LE progression, to decrease pain, to improve function. Baseline: Dependent Goal status: LLE GOAL PARTIALLY MET:  LLE comparative limb volumetrics on 05/16/24 reveal LLE volumetric increase  measuring 8.7% since last measured on 03/25/24. Overall the limb volume is decreased by 6.1%.  4.  Pt will obtain proper compression  garments/devices and achieve modified independence (extra time + assistive devices) with donning/doffing to optimize limb volume reductions and limit LE progression over time. Baseline:  Goal status: PARTIALLY MET. Initial LLE fitting 05/23/24 PLAN:  OT FREQUENCY: 1-2x/week  OT DURATION: 12 weeks and PRN  PLANNED INTERVENTIONS: 97110-Therapeutic exercises, 97530- Therapeutic activity, 97535- Self Care, 02859- Manual therapy, Patient/Family education, Manual lymph drainage, Compression bandaging, DME instructions, and fitting with compression garments  Custom-made gradient compression garments and HOS devices are medically necessary because they are uniquely sized and shaped to fit the exact dimensions of the affected extremities, and to provide appropriate medical grade, graduated compression essential for optimally managing chronic, progressive lymphedema. Multiple custom compression garments are needed to ensure proper hygiene to limit infection risk. Custom compression garments should be replaced q 3-6 months When worn consistently for optimal lipo-lymphedema self-management over time. HOS devices, medically necessary to limit fibrosis buildup in tissue, should be replaced q 2 years and PRN when worn out.     Pt should be fit:  3 Left and 3 Right, custom, Jobst, flat knit, Elvarex Classic, ccl 2 (23-32 mmHg) knee length compression stockings with open toes to limit and control chronic leg swelling, to improve lymphatic circulation, to reduce infection risk, to limit formation of tissue fibrosis and to limit progression of lymphedema over time. Custom compression garments should be replaced q 3-6 months when worn consistently for optimal lymphedema self-management.   Pt should be fit with 1 R and 1 L custom, Jobst Relax, ccl 2, convoluted Hours-of-Sleep (HOS)  compression device to counteract fluid accumulation and fibrosis formation leading to lymphedema progression. at night. HOS devices,  medically necessary to limit fibrosis buildup in tissue, should be replaced q 2 years and PRN when worn out.    PLAN FOR NEXT SESSION:  RLE volumes RLE MLD RLE compression wrapping.  Complete LLE fitting  Zebedee Dec, MS, OTR/L, CLT-LANA 05/23/24 3:53 PM

## 2024-05-27 ENCOUNTER — Ambulatory Visit: Admitting: Occupational Therapy

## 2024-05-27 ENCOUNTER — Other Ambulatory Visit: Payer: Self-pay | Admitting: General Practice

## 2024-05-27 DIAGNOSIS — F32A Depression, unspecified: Secondary | ICD-10-CM

## 2024-05-28 ENCOUNTER — Other Ambulatory Visit: Payer: Self-pay | Admitting: General Practice

## 2024-05-28 DIAGNOSIS — I10 Essential (primary) hypertension: Secondary | ICD-10-CM

## 2024-05-30 ENCOUNTER — Ambulatory Visit: Attending: Podiatry | Admitting: Occupational Therapy

## 2024-05-30 DIAGNOSIS — I890 Lymphedema, not elsewhere classified: Secondary | ICD-10-CM | POA: Diagnosis not present

## 2024-05-30 NOTE — Therapy (Signed)
 OUTPATIENT OCCUPATIONAL THERAPY TREATMENT NOTE   BILATERAL LOWER EXTREMITY LYMPHEDEMA Patient Name: [name] MRN: [id number] DOB:01/20/1956, 68 y.o., female Today's Date: 05/30/2024  REPORTING PERIOD:   END OF SESSION:   OT End of Session - 05/30/24 1515     Visit Number 41    Number of Visits 72    Date for OT Re-Evaluation 06/23/24    OT Start Time 0307    OT Stop Time 0402    OT Time Calculation (min) 55 min    Activity Tolerance Patient tolerated treatment well;No increased pain    Behavior During Therapy WFL for tasks assessed/performed          Past Medical History:  Diagnosis Date   Allergy    Anemia    Anxiety    Arthritis    Chronic venous insufficiency 12/04/2015   Depression    Fibromyalgia    History of chicken pox    HTN (hypertension)    MRSA (methicillin resistant Staphylococcus aureus)    Obese    Plantar fasciitis    Pneumonia    Seasonal allergies    Sleep apnea 2012   took test in [place]-told to use cpap-could not ude   Past Surgical History:  Procedure Laterality Date   ANTERIOR CERVICAL DECOMP/DISCECTOMY FUSION  05/09/2008   C5-6, C6-7; with arthrodesis also   GASTRIC BYPASS  10/24/2000   KNEE ARTHROSCOPY  10/24/2002   rt and lt knee   ROTATOR CUFF REPAIR W/ DISTAL CLAVICLE EXCISION  01/06/2006   right   SHOULDER ARTHROSCOPY Left 10/24/2001   TONSILLECTOMY     TOTAL KNEE ARTHROPLASTY  03/29/2004   left   TOTAL KNEE ARTHROPLASTY  11/07/2003   right   TOTAL KNEE REVISION  07/30/2010   right; with lateral release   UPPER GASTROINTESTINAL ENDOSCOPY     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 I don't know when it started.  + family hx of leg swelling-mother  SUBJECTIVE:  SUBJECTIVE STATEMENT: [name] returns to OT to continue Intensive Phase CDT to LLE. pT DENIES le RELATED LEG PAIN. Pt BRINGS custom compression garments to clinic. They arrived on Monday. PAIN:  Are you having LE related pain? No: NPRS scale: 0/10 Pain location: feet Pain description: burning Aggravating factors: walking Relieving factors: elevation  PRECAUTIONS: Other: LYMPHEDEMA PRECAUTIONS  WEIGHT BEARING RESTRICTIONS: No  FALLS:  Has patient fallen in last 6 months? 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   OBJECTIVE: Note: Objective  measures were completed at Evaluation unless otherwise noted.  COGNITION:  Overall cognitive status: frequent redirection needed during eval to stay on topic   POSTURE: WFL  LE ROM: grossly WFL  STRENGTH: grossly WFL   BLE COMPARATIVE LIMB VOLUMETRICS INITIAL 11/01/23  LANDMARK RIGHT  (dominant)  R LEG (A-D) 4296.6 ml  R THIGH (E-G) ml  R FULL LIMB (A-G) ml  Limb Volume differential (LVD)  %  Volume change since initial %  Volume change overall V  (Blank rows = not tested)  LANDMARK LEFT    L LEG (A-D) 4829.1 ml  L  THIGH (E-G) ml  L FULL LIMB (A-G) ml  Limb Volume differential (LVD)   12.4%, L>R  Volume change since initial %  Volume change overall %  (Blank rows = not tested)    9th Visit 12/06/23        LANDMARK LEFT    L LEG (A-D) 4888.8 ml  L  THIGH (E-G) ml  L FULL LIMB (A-G) ml  Limb Volume differential (LVD)     Volume change since initial  + 1.2 %  Volume change overall %  (Blank rows = not tested)    15 th Visit 01/10/24       LANDMARK LEFT    L LEG (A-D) 4637.8 ml  L  THIGH (E-G) ml  L FULL LIMB (A-G) ml  Limb Volume differential (LVD)     Volume change since last measured 12/06/23  DECREASED by 5.14%  Volume change overall %  (Blank rows = not tested)   20 th Visit- BLE COMPARATIVE LIMB VOLUMETRICS  02/01/24   LANDMARK LEFT    L LEG (A-D) 4139.2 ml  L  THIGH (E-G) ml  L FULL LIMB (A-G) ml  Limb Volume differential (LVD)     Volume change since initial DECREASED 10.8 %  Volume change overall %  (Blank rows = not tested)    30 th Visit- BLE COMPARATIVE LIMB VOLUMETRICS  03/25/24   LANDMARK LEFT    L LEG (A-D) 4173.5 ml  L  THIGH (E-G) ml  L FULL LIMB (A-G) ml  Limb Volume differential (LVD)     Volume change since LAST MEASURED ON 02/01/24  Inc BY 0.8 %  Volume change overall %  (Blank rows = not tested)    39 th Visit- LLE COMPARATIVE LIMB VOLUMETRICS  05/16/24   LANDMARK LEFT    L LEG (A-D) 4535.0 ml  L  THIGH (E-G) ml  L FULL  LIMB (A-G) ml  Limb Volume differential (LVD)     Volume change since LAST MEASURED ON 03/25/24 INCREASED 8.7%  Volume change overall DECREASED 6.1%   (Blank rows = not tested)   Mild, Stage  II, 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 Obesity  Skin  Description Hyper-Keratosis Peau' de Orange Shiny Tight Fibrotic/ Indurated Fatty Doughy Spongy/ boggy   x    Bilateral Chronic Lipo dermatosclerosis Hardened, thickened, leathery      Skin dry Flaky WNL Macerated   mildly x     Color Redness Varicosities Blanching Hemosiderin Stain Mottled   x x x  Severe bilaterally x   Odor Malodorous Yeast Fungal infection  WNL      x   Temperature Warm Cool wnl    x     Pitting Edema   1+ 2+ 3+ 4+ Non-pitting         x   Girth Symmetrical Asymmetrical                   Distribution    L>R toes to popliteal     Stemmer Sign Positive Negative   +    Lymphorrhea History Of:  Present Absent     x    Wounds History Of Present Absent Venous Arterial Pressure Sheer     x        Signs of Infection Redness Warmth Erythema Acute Swelling Drainage Borders                    Sensation Light Touch Deep pressure Hypersensitivity Neuropathic pain   In Tact Impaired In tact Impaired Absent Impaired Plantar surfaces bilaterally   x  x  x      Nails WNL   Fungus nail dystrophy     x   Hair Growth Symmetrical Asymmetrical   x    Skin Creases Base of toes  Ankles   Base of Fingers knees       Abdominal pannus Thigh Lobules  Face/neck   x           TODAY'S TREATMENT:                                                                                                                                         Pt EDU FOR GARMENT DONNING AND DOFFING, WEAR AND CARE, ORDERING ROUTINE  PATIENT EDUCATION:  Continued Pt/ CG edu for lymphedema self care home program throughout session. Topics include outcome of comparative limb volumetrics- starting limb volume differentials (LVDs),  technology and gradient techniques used for short stretch, multilayer compression wrapping, simple self-MLD, therapeutic lymphatic pumping exercises, skin/nail care, LE precautions,. compression garment recommendations and specifications, wear and care schedule and compression garment donning / doffing w assistive devices. Discussed progress towards all OT goals since commencing CDT. All questions answered to the Pt's satisfaction. Good return. Person educated:  Patient  Education method: Explanation, Demonstration, and Handouts Education comprehension: verbalized understanding, returned demonstration, verbal cues required, and needs further education  HOME EXERCISE PROGRAM: BLE lymphatic pumping there ex using- 1 sets of 10 reps, each exercise in order-  1-2 x daily, bilaterally Simple self MLD 1 x daily Daily skin care to increase hydration, skin mobility and decrease infection risk- can be done during MLD Compression wraps 23/7 until garment fitting complete   ASSESSMENT  CLINICAL IMPRESSION:  Commenced modified Intensive Phase CDT to RLE today. Pt tolerated MLD as established. Applied gradient compression wraps without increased pain. Cont 1 x weekly per Pt's preference for scheduling vs recommended 2 x weekly.  Next session check RLE volumetrics.   (Initial Eval 09/13/23: Rie Mcneil is a 68 y.o. 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 Pt denies hx of known, chronic limb swelling in her family. She does not utilize compression garments, or a vasopneumatic compression pump as conservative measures. Chronic limb swelling and associated pain limits functional performance in all occupational domains, including functional ambulation and mobility,  self-care and basic and instrumental ADLs. It limits participation in leisure pursuits, productive activities and social participle[ation.   Pt will benefit from  skilled Occupational Therapy for both  Intensive and Self -management phases of Complete Decongestive Therapy (CDT) to reduce limb swelling and associated pain, to limit progression, to reduce infection risk , and to increase functional performance, social participation and quality of life. CDT include manual lymphatic drainage (MLD), skin care, therapeutic exercise and compression therapies- multilayer bandaging initially, one limb at a time,  and then appropriate compression garments, or alternatives in the final phase. Pt and caregivers who assist are trained to perform a;; aspects of Lymphedema self-care top ensure optimal self management over time. Without skilled  OT for CDT, further progression of lymphedema is certain and further functional decline is expected.)   OBJECTIVE IMPAIRMENTS: decreased activity tolerance, decreased knowledge of condition, decreased knowledge of use of DME, increased edema, pain, and chronic leg swelling.   ACTIVITY LIMITATIONS: limited functional ambulation and functional mobility 2/2 lymphedema -related foot and leg pain; standing, squatting, stairs, bathing, toileting, dressing, and hygiene/grooming  PARTICIPATION LIMITATIONS: meal prep, cooking, cleaning, driving, shopping, community activity, and volunteering  PERSONAL FACTORS: Time since onset of injury/illness/exacerbation are also affecting patient's functional outcome.   REHAB POTENTIAL: Good  CLINICAL DECISION MAKING: Evolving/moderate complexity  EVALUATION COMPLEXITY: Moderate   GOALS: Goals reviewed with patient? Yes  SHORT TERM GOALS: Target date: 4th OT Rx visit   Pt will demonstrate understanding of lymphedema precautions and prevention strategies with modified independence using a printed reference to identify at least 5  precautions and discussing how s/he may implement them into daily life to reduce risk of progression with extra time. Baseline:Max A Goal status: 11/20/23 GOAL MET  2.  Pt will be able to apply multilayer, knee length, gradient, compression wraps to one leg at a time with max caregiver assistance to decrease limb volume, to limit infection risk, and to limit lymphedema progression.  Baseline: Dependent Goal status: 11/13/23 GOAL MET  LONG TERM GOALS: Target date: 12/12/22  Given this patient's Intake score of TBD % on the functional outcomes FOTO tool, patient will experience an increase in function of 5 points to improve basic and instrumental  ADLs performance, including lymphedema self-care.  Baseline: Max A Goal status: GOAL DEFERRED. FOTO tool discontinued.  2.  Given this patient's Intake score of 64.71 on the Lymphedema Life Impact Scale (LLIS), patient will experience a reduction of at least 5 points in her perceived level of functional impairment resulting from lymphedema to improve functional performance and quality of life (QOL). Baseline: 64.71% Goal status: PROGRESSING  3.  Pt will achieve at least a 10% volume reduction in B legs to return limb to typical size and shape, to limit infection risk and LE progression, to decrease pain, to improve function. Baseline: Dependent Goal status: LLE GOAL PARTIALLY MET:  LLE comparative limb volumetrics on 05/16/24 reveal LLE volumetric increase  measuring 8.7% since last measured on 03/25/24. Overall the limb volume is decreased by 6.1%.  4.  Pt will obtain proper compression garments/devices and achieve modified independence (extra time + assistive devices) with donning/doffing to optimize limb volume reductions and limit LE progression over time. Baseline:  Goal status: PARTIALLY MET. Initial LLE fitting 05/23/24 PLAN:  OT FREQUENCY: 1-2x/week  OT DURATION: 12 weeks and PRN  PLANNED INTERVENTIONS: 97110-Therapeutic exercises, 97530-  Therapeutic activity, 97535- Self Care, [id number]- Manual therapy, Patient/Family education, Manual lymph drainage, Compression bandaging, DME instructions, and fitting with compression garments  Custom-made gradient compression garments and HOS devices are medically necessary because they are uniquely sized and shaped to fit the exact dimensions of the affected extremities, and to provide appropriate medical grade, graduated compression essential for optimally managing chronic, progressive lymphedema. Multiple custom compression garments are needed to ensure proper hygiene to limit infection risk. Custom compression garments should be replaced q 3-6 months When worn consistently for optimal lipo-lymphedema self-management over time. HOS devices, medically necessary to limit fibrosis buildup in tissue, should be replaced q 2 years and PRN when worn out.     Pt should be fit:  3 Left and 3 Right, custom, Jobst, flat knit, Elvarex Classic, ccl 2 (23-32 mmHg) knee length compression stockings with open toes to limit and control chronic leg swelling, to improve lymphatic circulation, to reduce infection risk, to limit formation of tissue fibrosis and to limit progression of lymphedema over time. Custom compression garments should be replaced q 3-6 months when worn consistently for optimal lymphedema self-management.   Pt should be fit with 1 R and 1 L custom, Jobst Relax, ccl 2, convoluted Hours-of-Sleep (HOS)  compression device to counteract fluid accumulation and fibrosis formation leading to lymphedema progression. at night. HOS devices, medically necessary to limit fibrosis buildup in tissue, should be replaced q 2 years and PRN when worn out.    PLAN FOR NEXT SESSION:  RLE volumes RLE MLD RLE compression wrapping.  Complete LLE fitting  Zebedee Dec, MS, OTR/L, CLT-LANA 05/30/24 4:04 PM

## 2024-05-31 ENCOUNTER — Other Ambulatory Visit: Payer: Self-pay | Admitting: General Practice

## 2024-06-03 ENCOUNTER — Ambulatory Visit: Admitting: Occupational Therapy

## 2024-06-03 ENCOUNTER — Ambulatory Visit: Admitting: General Practice

## 2024-06-04 ENCOUNTER — Ambulatory Visit: Admitting: Occupational Therapy

## 2024-06-04 ENCOUNTER — Other Ambulatory Visit (HOSPITAL_COMMUNITY): Payer: Self-pay

## 2024-06-04 DIAGNOSIS — M62831 Muscle spasm of calf: Secondary | ICD-10-CM | POA: Diagnosis not present

## 2024-06-04 DIAGNOSIS — M150 Primary generalized (osteo)arthritis: Secondary | ICD-10-CM | POA: Diagnosis not present

## 2024-06-04 DIAGNOSIS — G894 Chronic pain syndrome: Secondary | ICD-10-CM | POA: Diagnosis not present

## 2024-06-04 DIAGNOSIS — M25551 Pain in right hip: Secondary | ICD-10-CM | POA: Diagnosis not present

## 2024-06-04 MED ORDER — MORPHINE SULFATE ER 30 MG PO TBCR
30.0000 mg | EXTENDED_RELEASE_TABLET | Freq: Two times a day (BID) | ORAL | 0 refills | Status: AC
  Filled 2024-07-15: qty 60, 30d supply, fill #0

## 2024-06-04 MED ORDER — MORPHINE SULFATE ER 30 MG PO TBCR
30.0000 mg | EXTENDED_RELEASE_TABLET | Freq: Two times a day (BID) | ORAL | 0 refills | Status: AC
  Filled 2024-06-08: qty 60, 30d supply, fill #0

## 2024-06-05 ENCOUNTER — Other Ambulatory Visit (HOSPITAL_COMMUNITY): Payer: Self-pay

## 2024-06-06 ENCOUNTER — Ambulatory Visit (INDEPENDENT_AMBULATORY_CARE_PROVIDER_SITE_OTHER): Admitting: General Practice

## 2024-06-06 ENCOUNTER — Ambulatory Visit: Admitting: Occupational Therapy

## 2024-06-06 ENCOUNTER — Encounter: Payer: Self-pay | Admitting: General Practice

## 2024-06-06 ENCOUNTER — Telehealth: Payer: Self-pay

## 2024-06-06 VITALS — BP 112/62 | HR 90 | Temp 97.9°F | Ht 66.0 in | Wt 243.0 lb

## 2024-06-06 DIAGNOSIS — E66812 Obesity, class 2: Secondary | ICD-10-CM | POA: Diagnosis not present

## 2024-06-06 DIAGNOSIS — I890 Lymphedema, not elsewhere classified: Secondary | ICD-10-CM

## 2024-06-06 DIAGNOSIS — Z6839 Body mass index (BMI) 39.0-39.9, adult: Secondary | ICD-10-CM | POA: Diagnosis not present

## 2024-06-06 DIAGNOSIS — F32A Depression, unspecified: Secondary | ICD-10-CM

## 2024-06-06 DIAGNOSIS — F411 Generalized anxiety disorder: Secondary | ICD-10-CM | POA: Diagnosis not present

## 2024-06-06 MED ORDER — HYDROXYZINE PAMOATE 25 MG PO CAPS: 25.0000 mg | ORAL_CAPSULE | Freq: Three times a day (TID) | ORAL | 0 refills | Status: DC | PRN

## 2024-06-06 NOTE — Patient Instructions (Addendum)
 Continue Cymbalta  at current dose.   Start hydroxyzine  25 mg as needed.   Schedule physical in three months.   It was a pleasure to see you today!

## 2024-06-06 NOTE — Therapy (Signed)
 OUTPATIENT OCCUPATIONAL THERAPY TREATMENT NOTE   BILATERAL LOWER EXTREMITY LYMPHEDEMA Patient Name: [name] MRN: [id number] DOB:[birth year], 68 y.o., female Today's Date: 06/07/2024  REPORTING PERIOD:   END OF SESSION:   OT End of Session - 06/06/24 1509     Visit Number 42    Number of Visits 72    Date for OT Re-Evaluation 06/23/24    OT Start Time 0307    OT Stop Time 0400    OT Time Calculation (min) 53 min    Activity Tolerance Patient tolerated treatment well;No increased pain    Behavior During Therapy WFL for tasks assessed/performed          Past Medical History:  Diagnosis Date   Allergy    Anemia    Anxiety    Arthritis    Chronic venous insufficiency 12/04/2015   Depression    Fibromyalgia    History of chicken pox    HTN (hypertension)    MRSA (methicillin resistant Staphylococcus aureus)    Obese    Plantar fasciitis    Pneumonia    Seasonal allergies    Sleep apnea 2012   took test in [place]-told to use cpap-could not ude   Past Surgical History:  Procedure Laterality Date   ANTERIOR CERVICAL DECOMP/DISCECTOMY FUSION  05/09/2008   C5-6, C6-7; with arthrodesis also   GASTRIC BYPASS  10/24/2000   KNEE ARTHROSCOPY  10/24/2002   rt and lt knee   ROTATOR CUFF REPAIR W/ DISTAL CLAVICLE EXCISION  01/06/2006   right   SHOULDER ARTHROSCOPY Left 10/24/2001   TONSILLECTOMY     TOTAL KNEE ARTHROPLASTY  03/29/2004   left   TOTAL KNEE ARTHROPLASTY  11/07/2003   right   TOTAL KNEE REVISION  07/30/2010   right; with lateral release   UPPER GASTROINTESTINAL ENDOSCOPY     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 I don't know when it started.  + family hx of leg swelling-mother  SUBJECTIVE:  SUBJECTIVE STATEMENT: [name] returns to OT to continue Intensive Phase CDT to LLE. Pt reports 5/10 pain in both knees. Pt is accompanied by her spouse , Kristi. When asked how compression went between visits, she states she was unwrapped for the past 2 days. PAIN:  Are you having LE related pain? No: NPRS scale: 0/10 Pain location: feet Pain description: burning Aggravating factors: walking Relieving factors: elevation  PRECAUTIONS: Other: LYMPHEDEMA PRECAUTIONS  WEIGHT BEARING RESTRICTIONS: No  FALLS:  Has patient fallen in last 6 months? 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   OBJECTIVE: Note: Objective measures were completed at Evaluation unless otherwise noted.  COGNITION:  Overall cognitive status: frequent redirection needed during eval to stay on topic   POSTURE: WFL  LE ROM: grossly WFL  STRENGTH: grossly WFL   BLE COMPARATIVE LIMB VOLUMETRICS INITIAL 11/01/23  LANDMARK RIGHT  (dominant)  R LEG (A-D) 4296.6 ml  R THIGH (E-G) ml  R FULL LIMB (A-G) ml  Limb Volume differential (LVD)  %  Volume change since initial %  Volume change overall V  (Blank rows = not tested)  LANDMARK LEFT    L LEG (A-D) 4829.1 ml  L  THIGH (E-G) ml  L FULL LIMB (A-G) ml  Limb Volume differential (LVD)   12.4%, L>R  Volume change since initial %  Volume change overall %  (Blank rows = not tested)    9th Visit 12/06/23        LANDMARK LEFT    L LEG (A-D) 4888.8 ml  L  THIGH (E-G) ml  L FULL LIMB (A-G) ml  Limb Volume differential (LVD)     Volume change since initial  + 1.2 %  Volume change overall %  (Blank rows = not tested)    15 th Visit 01/10/24       LANDMARK LEFT    L LEG (A-D) 4637.8 ml  L  THIGH (E-G) ml  L FULL LIMB (A-G) ml  Limb Volume differential (LVD)     Volume change since last measured 12/06/23  DECREASED by 5.14%  Volume change overall %  (Blank rows = not tested)   20 th Visit- BLE COMPARATIVE LIMB VOLUMETRICS  02/01/24   LANDMARK LEFT    L LEG (A-D) 4139.2 ml  L  THIGH (E-G) ml  L FULL LIMB (A-G) ml  Limb Volume differential (LVD)     Volume change since initial DECREASED 10.8 %  Volume change overall %  (Blank rows = not tested)    30 th Visit- BLE COMPARATIVE LIMB VOLUMETRICS  03/25/24   LANDMARK LEFT    L LEG (A-D) 4173.5 ml  L  THIGH (E-G) ml  L FULL LIMB (A-G) ml  Limb Volume differential (LVD)     Volume change since LAST MEASURED ON 02/01/24  Inc BY 0.8 %  Volume change overall %  (Blank rows = not tested)    39 th Visit- LLE COMPARATIVE LIMB VOLUMETRICS   05/16/24   LANDMARK LEFT  L LEG (A-D) 4535.0 ml  L  THIGH (E-G) ml  L FULL LIMB (A-G) ml  Limb Volume differential (LVD)     Volume change since LAST MEASURED ON 03/25/24 INCREASED 8.7%  Volume change overall DECREASED 6.1%   (Blank rows = not tested)   Mild, Stage  II, 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 Obesity  Skin  Description Hyper-Keratosis Peau' de Orange Shiny Tight Fibrotic/ Indurated Fatty Doughy Spongy/ boggy   x    Bilateral Chronic Lipo dermatosclerosis Hardened, thickened, leathery      Skin dry Flaky WNL Macerated   mildly x     Color Redness Varicosities Blanching Hemosiderin Stain Mottled   x x x  Severe bilaterally x   Odor Malodorous Yeast Fungal infection  WNL      x   Temperature Warm Cool wnl    x     Pitting Edema   1+ 2+ 3+ 4+ Non-pitting         x   Girth Symmetrical Asymmetrical                   Distribution    L>R toes to popliteal     Stemmer Sign Positive Negative   +    Lymphorrhea History Of:  Present Absent     x    Wounds History Of Present Absent Venous Arterial Pressure Sheer     x        Signs of Infection Redness Warmth Erythema Acute Swelling Drainage Borders                    Sensation Light Touch Deep pressure Hypersensitivity Neuropathic pain   In Tact Impaired In tact Impaired Absent Impaired Plantar surfaces bilaterally   x  x  x      Nails WNL   Fungus nail dystrophy     x   Hair Growth Symmetrical Asymmetrical   x    Skin Creases Base of toes  Ankles   Base of Fingers knees       Abdominal pannus Thigh Lobules  Face/neck   x           TODAY'S TREATMENT:                                                                                                                                         Pt EDU FOR GARMENT DONNING AND DOFFING, WEAR AND CARE, ORDERING ROUTINE  PATIENT EDUCATION:  Continued Pt/ CG edu for lymphedema self care home program throughout session. Topics include  outcome of comparative limb volumetrics- starting limb volume differentials (LVDs), technology and gradient techniques used for short stretch, multilayer compression wrapping, simple self-MLD, therapeutic lymphatic pumping exercises, skin/nail care, LE precautions,. compression garment recommendations and specifications, wear and care schedule and compression garment donning / doffing w assistive devices. Discussed progress towards all OT goals  since commencing CDT. All questions answered to the Pt's satisfaction. Good return. Person educated: Patient  Education method: Explanation, Demonstration, and Handouts Education comprehension: verbalized understanding, returned demonstration, verbal cues required, and needs further education  HOME EXERCISE PROGRAM: BLE lymphatic pumping there ex using- 1 sets of 10 reps, each exercise in order-  1-2 x daily, bilaterally Simple self MLD 1 x daily Daily skin care to increase hydration, skin mobility and decrease infection risk- can be done during MLD Compression wraps 23/7 until garment fitting complete   ASSESSMENT  CLINICAL IMPRESSION:  Pt is tolerated the LLE compression stocking well. No remake needed. Pt is wearing stocking as directed. R LEG is quite a bit more swollen today with dramatic increase in palpable tissue density. Skin is blanching and pitting 1+ just distal t to anterior knee. Pt demonstrates fluctuating compliance with RLE compression between visits. Pt not consistently wearing compression wraps, and swelling is fluctuating. Pt understands that next we will complete RLE garment measurements at the end of next week, despite limited compliance this week.  Pt tolerated MLD as established. Applied gradient compression wraps without increased pain. Cont 1 x weekly per Pt's preference.  (Initial Eval 09/13/23: Sherlin Sonier is a 68 y.o. female presenting with mild, BLE lymphedema 2.2 CVI and Obesity which contributes to and is affected by multiple  co morbidities , including arthritis, fibromyalgia, HTN, obesity, plantar fasciitis and sleep apnea. 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 Pt denies hx of known, chronic limb swelling in her family. She does not utilize compression garments, or a vasopneumatic compression pump as conservative measures. Chronic limb swelling and associated pain limits functional performance in all occupational domains, including functional ambulation and mobility, self-care and basic and instrumental ADLs. It limits participation in leisure pursuits, productive activities and social participle[ation.   Pt will benefit from  skilled Occupational Therapy for both  Intensive and Self -management phases of Complete Decongestive Therapy (CDT) to reduce limb swelling and associated pain, to limit progression, to reduce infection risk , and to increase functional performance, social participation and quality of life. CDT include manual lymphatic drainage (MLD), skin care, therapeutic exercise and compression therapies- multilayer bandaging initially, one limb at a time,  and then appropriate compression garments, or alternatives in the final phase. Pt and caregivers who assist are trained to perform a;; aspects of Lymphedema self-care top ensure optimal self management over time. Without skilled  OT for CDT, further progression of lymphedema is certain and further functional decline is expected.)   OBJECTIVE IMPAIRMENTS: decreased activity tolerance, decreased knowledge of condition, decreased knowledge of use of DME, increased edema, pain, and chronic leg swelling.   ACTIVITY LIMITATIONS: limited functional ambulation and functional mobility 2/2 lymphedema -related foot and leg pain; standing, squatting, stairs, bathing, toileting, dressing, and hygiene/grooming  PARTICIPATION LIMITATIONS: meal prep, cooking, cleaning, driving, shopping, community activity, and  volunteering  PERSONAL FACTORS: Time since onset of injury/illness/exacerbation are also affecting patient's functional outcome.   REHAB POTENTIAL: Good  CLINICAL DECISION MAKING: Evolving/moderate complexity  EVALUATION COMPLEXITY: Moderate   GOALS: Goals reviewed with patient? Yes  SHORT TERM GOALS: Target date: 4th OT Rx visit   Pt will demonstrate understanding of lymphedema precautions and prevention strategies with modified independence using a printed reference to identify at least 5 precautions and discussing how s/he may implement them into daily life to reduce risk of progression with extra time. Baseline:Max A Goal status: 11/20/23 GOAL MET  2.  Pt will  be able to apply multilayer, knee length, gradient, compression wraps to one leg at a time with max caregiver assistance to decrease limb volume, to limit infection risk, and to limit lymphedema progression.  Baseline: Dependent Goal status: 11/13/23 GOAL MET  LONG TERM GOALS: Target date: 12/12/22  Given this patient's Intake score of TBD % on the functional outcomes FOTO tool, patient will experience an increase in function of 5 points to improve basic and instrumental ADLs performance, including lymphedema self-care.  Baseline: Max A Goal status: GOAL DEFERRED. FOTO tool discontinued.  2.  Given this patient's Intake score of 64.71 on the Lymphedema Life Impact Scale (LLIS), patient will experience a reduction of at least 5 points in her perceived level of functional impairment resulting from lymphedema to improve functional performance and quality of life (QOL). Baseline: 64.71% Goal status: PROGRESSING  3.  Pt will achieve at least a 10% volume reduction in B legs to return limb to typical size and shape, to limit infection risk and LE progression, to decrease pain, to improve function. Baseline: Dependent Goal status: LLE GOAL PARTIALLY MET:  LLE comparative limb volumetrics on 05/16/24 reveal LLE volumetric increase   measuring 8.7% since last measured on 03/25/24. Overall the limb volume is decreased by 6.1%.  4.  Pt will obtain proper compression garments/devices and achieve modified independence (extra time + assistive devices) with donning/doffing to optimize limb volume reductions and limit LE progression over time. Baseline:  Goal status: PARTIALLY MET. Initial LLE fitting 05/23/24 PLAN:  OT FREQUENCY: 1-2x/week  OT DURATION: 12 weeks and PRN  PLANNED INTERVENTIONS: 97110-Therapeutic exercises, 97530- Therapeutic activity, 97535- Self Care, 02859- Manual therapy, Patient/Family education, Manual lymph drainage, Compression bandaging, DME instructions, and fitting with compression garments  Custom-made gradient compression garments and HOS devices are medically necessary because they are uniquely sized and shaped to fit the exact dimensions of the affected extremities, and to provide appropriate medical grade, graduated compression essential for optimally managing chronic, progressive lymphedema. Multiple custom compression garments are needed to ensure proper hygiene to limit infection risk. Custom compression garments should be replaced q 3-6 months When worn consistently for optimal lipo-lymphedema self-management over time. HOS devices, medically necessary to limit fibrosis buildup in tissue, should be replaced q 2 years and PRN when worn out.     Pt should be fit:  3 Left and 3 Right, custom, Jobst, flat knit, Elvarex Classic, ccl 2 (23-32 mmHg) knee length compression stockings with open toes to limit and control chronic leg swelling, to improve lymphatic circulation, to reduce infection risk, to limit formation of tissue fibrosis and to limit progression of lymphedema over time. Custom compression garments should be replaced q 3-6 months when worn consistently for optimal lymphedema self-management.   Pt should be fit with 1 R and 1 L custom, Jobst Relax, ccl 2, convoluted Hours-of-Sleep (HOS)   compression device to counteract fluid accumulation and fibrosis formation leading to lymphedema progression. at night. HOS devices, medically necessary to limit fibrosis buildup in tissue, should be replaced q 2 years and PRN when worn out.    PLAN FOR NEXT SESSION:  RLE volumes RLE MLD RLE compression wrapping.  Complete LLE fitting  Zebedee Dec, MS, OTR/L, CLT-LANA 06/07/24 12:43 PM

## 2024-06-06 NOTE — Progress Notes (Signed)
 Established Patient Office Visit  Subjective   Patient ID: [name], female    DOB: May 09, 1956  Age: 68 y.o. MRN: [id number]  Chief Complaint  Patient presents with   Depression    Patient here today for follow up; patient is doing okay on the Cymbalta       [name] is a 68 year old female with past medical history of HTN, CAP, Psoriatic arthritis, alcohol abuse, GAD, ADHD, [name] presents today for a follow up in depression.   Discussed the use of AI scribe software for clinical note transcription with the patient, who gave verbal consent to proceed.  History of Present Illness She has been experiencing persistent anxiety symptoms despite recent adjustments to her depression medication. She has had three panic attacks in the past two weeks, characterized by feelings of fear, impending doom, difficulty breathing, and a sensation of the room closing in. These episodes have been severe enough to require her to go outside to calm down, with one episode taking about an hour to resolve.  Her depression medication, Cymbalta , was increased from 60 mg to 120 mg daily on April 10, 2024. She reports that she has not noticed much difference in her symptoms of depression or anxiety since the dosage increase. She started the increased dose approximately 2 weeks after the prescription was issued due to insurance and pharmacy delays.  She is currently taking morphine  and Zanaflex  for pain management. She is concerned about potential interactions with her current medications.  She has a history of arthritis and weight management issues, which she believes contribute to her overall health problems. She is currently on a weight management medication, Zepbound , at a dose of 10 mg, but wants to increase the dose due to insufficient efficacy at the current level.   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   Insomnia    Psoriatic arthritis (HCC)    Cough    Chronic anemia 12/05/2015   Hoarding disorder 03/05/2015   Alcohol abuse 11/21/2014   Anxiety state 11/21/2014   Obesity 11/21/2014   Hematemesis 03/07/2014   Depressive disorder 12/09/2013   GAD (generalized anxiety disorder) 09/17/2013   Fibromyalgia 07/22/2013   Past Medical History:  Diagnosis Date   Allergy    Anemia    Anxiety    Arthritis    Chronic venous insufficiency 12/04/2015   Depression    Fibromyalgia    History of chicken pox    HTN (hypertension)    MRSA (methicillin resistant Staphylococcus aureus)    Obese    Plantar fasciitis    Pneumonia    Seasonal allergies    Sleep apnea 2012   took test in [place]-told to use cpap-could not ude   Past Surgical History:  Procedure Laterality Date   ANTERIOR CERVICAL DECOMP/DISCECTOMY FUSION  05/09/2008   C5-6, C6-7; with arthrodesis also   GASTRIC BYPASS  10/24/2000   KNEE ARTHROSCOPY  10/24/2002   rt and lt knee   ROTATOR CUFF REPAIR W/ DISTAL CLAVICLE EXCISION  01/06/2006   right   SHOULDER ARTHROSCOPY Left 10/24/2001   TONSILLECTOMY     TOTAL KNEE ARTHROPLASTY  03/29/2004   left   TOTAL KNEE  ARTHROPLASTY  11/07/2003   right   TOTAL KNEE REVISION  07/30/2010   right; with lateral release   UPPER GASTROINTESTINAL ENDOSCOPY     Allergies  Allergen Reactions   Infliximab Other (See Comments)         06/06/2024   11:13 AM 05/03/2024    1:59 PM 04/10/2024   12:24 PM  Depression screen PHQ 2/9  Decreased Interest 1 1 1   Down, Depressed, Hopeless 1 1 1   PHQ - 2 Score 2 2 2   Altered sleeping 1 0 1  Tired, decreased energy 3 1 2   Change in appetite 1 1 0  Feeling bad or failure about yourself  1 0 0  Trouble  concentrating 1 0 1  Moving slowly or fidgety/restless 0 0 0  Suicidal thoughts 0 0 0  PHQ-9 Score 9 4 6   Difficult doing work/chores Not difficult at all Somewhat difficult Somewhat difficult       06/06/2024   11:13 AM 04/10/2024   12:24 PM 03/11/2024   11:41 AM 12/28/2023   12:25 PM  GAD 7 : Generalized Anxiety Score  Nervous, Anxious, on Edge 1 1 0 0  Control/stop worrying 0 0 0 0  Worry too much - different things 1 1 1  0  Trouble relaxing 0 1 0 0  Restless 0 1 0 0  Easily annoyed or irritable 0 1 0 0  Afraid - awful might happen 0 0 0 0  Total GAD 7 Score 2 5 1  0  Anxiety Difficulty Not difficult at all Not difficult at all Not difficult at all Not difficult at all      Review of Systems  Constitutional:  Negative for chills and fever.  Respiratory:  Negative for shortness of breath.   Cardiovascular:  Negative for chest pain.  Gastrointestinal:  Negative for abdominal pain, constipation, diarrhea, heartburn, nausea and vomiting.  Genitourinary:  Negative for dysuria, frequency and urgency.  Neurological:  Negative for dizziness and headaches.  Endo/Heme/Allergies:  Negative for polydipsia.  Psychiatric/Behavioral:  Positive for depression. Negative for suicidal ideas. The patient is nervous/anxious.       Objective:     BP 112/62   Pulse 90   Temp 97.9 F (36.6 C) (Temporal)   Ht 5' 6 (1.676 m)   Wt 243 lb (110.2 kg)   SpO2 96%   BMI 39.22 kg/m  BP Readings from Last 3 Encounters:  06/06/24 112/62  04/10/24 122/82  03/11/24 116/80   Wt Readings from Last 3 Encounters:  06/06/24 243 lb (110.2 kg)  05/03/24 244 lb (110.7 kg)  04/10/24 244 lb (110.7 kg)      Physical Exam Vitals and nursing note reviewed.  Constitutional:      Appearance: Normal appearance.  Cardiovascular:     Rate and Rhythm: Normal rate and regular rhythm.     Pulses: Normal pulses.     Heart sounds: Normal heart sounds.  Pulmonary:     Effort: Pulmonary effort is normal.      Breath sounds: Normal breath sounds.  Neurological:     Mental Status: She is alert and oriented to person, place, and time.  Psychiatric:        Mood and Affect: Mood normal.        Behavior: Behavior normal.        Thought Content: Thought content normal.        Judgment: Judgment normal.      No results found for any visits on 06/06/24.  The 10-year ASCVD risk score (Arnett DK, et al., 2019) is: 5.2%    Assessment & Plan:  Assessment and Plan Assessment & Plan Major depressive disorder with inadequate response to current therapy On increased dose of Cymbalta  (120 mg daily) for 3-4 weeks. Full effect expected in 6-8 weeks. No noticeable improvement yet. - Continue Cymbalta  at 120 mg daily. - Reassess symptoms in follow-up.  Generalized anxiety disorder with recent panic attacks Experienced three panic attacks in two weeks. Prefers not adding daily medication. Hydroxyzine  considered for acute management. - Prescribe hydroxyzine  25 mg as needed for panic attacks, up to three times a day. - Declines Buspar at this time.   Class 2 severe obesity (BMI 39.0-39.9) due to excess calories Weight contributes to arthritis and other health issues. Needs increase to Zepbound  12.5 mg for better efficacy. Insurance does not cover higher dose. - Continue Zepbound  10 mg. - Contact [employer] for self-pay pricing of Zepbound  12.5 mg. - If affordable, inform office to send prescription for Zepbound  12.5 mg.     Return in about 3 months (around 09/06/2024) for Physical and fasting labs. SABRA Carrol Aurora, NP

## 2024-06-06 NOTE — Telephone Encounter (Signed)
 Copied from CRM #[id number]. Topic: Clinical - Prescription Issue >> Jun 06, 2024  2:40 PM Rea ORN wrote: Reason for CRM: Pt calling back regarding medication discussed at today's appointment. She would like Zepbound  sent to [facility]. Pt stated PCP will know what this is about because they spoke about it this morning.

## 2024-06-07 ENCOUNTER — Other Ambulatory Visit (HOSPITAL_COMMUNITY): Payer: Self-pay

## 2024-06-07 MED ORDER — ZEPBOUND 12.5 MG/0.5ML ~~LOC~~ SOLN: 12.5000 mg | SUBCUTANEOUS | 0 refills | Status: DC

## 2024-06-07 NOTE — Telephone Encounter (Signed)
 Please advise patient that I have sent the Zepbound  12.5 mg once weekly vial to [facility]. She can update us  if she has any trouble with the prescription.

## 2024-06-07 NOTE — Telephone Encounter (Signed)
 Called patient and left VM about prescription being sent.

## 2024-06-08 ENCOUNTER — Other Ambulatory Visit (HOSPITAL_COMMUNITY): Payer: Self-pay

## 2024-06-10 ENCOUNTER — Other Ambulatory Visit: Payer: Self-pay

## 2024-06-10 ENCOUNTER — Ambulatory Visit: Admitting: Occupational Therapy

## 2024-06-12 ENCOUNTER — Other Ambulatory Visit (HOSPITAL_COMMUNITY): Payer: Self-pay

## 2024-06-13 ENCOUNTER — Ambulatory Visit: Admitting: Occupational Therapy

## 2024-06-13 ENCOUNTER — Other Ambulatory Visit: Payer: Self-pay | Admitting: General Practice

## 2024-06-13 DIAGNOSIS — I890 Lymphedema, not elsewhere classified: Secondary | ICD-10-CM | POA: Diagnosis not present

## 2024-06-13 DIAGNOSIS — F32A Depression, unspecified: Secondary | ICD-10-CM

## 2024-06-14 NOTE — Therapy (Signed)
 OUTPATIENT OCCUPATIONAL THERAPY TREATMENT NOTE   BILATERAL LOWER EXTREMITY LYMPHEDEMA Patient Name: [name] MRN: [id number] DOB:03-24-1956, 68 y.o., female Today's Date: 06/14/2024  REPORTING PERIOD:   END OF SESSION:   OT End of Session - 06/13/24 1415     Visit Number 43    Number of Visits 72    Date for OT Re-Evaluation 06/23/24    OT Start Time 0205    OT Stop Time 0304    OT Time Calculation (min) 59 min    Activity Tolerance Patient tolerated treatment well;No increased pain    Behavior During Therapy WFL for tasks assessed/performed          Past Medical History:  Diagnosis Date   Allergy    Anemia    Anxiety    Arthritis    Chronic venous insufficiency 12/04/2015   Depression    Fibromyalgia    History of chicken pox    HTN (hypertension)    MRSA (methicillin resistant Staphylococcus aureus)    Obese    Plantar fasciitis    Pneumonia    Seasonal allergies    Sleep apnea 2012   took test in [place]-told to use cpap-could not ude   Past Surgical History:  Procedure Laterality Date   ANTERIOR CERVICAL DECOMP/DISCECTOMY FUSION  05/09/2008   C5-6, C6-7; with arthrodesis also   GASTRIC BYPASS  10/24/2000   KNEE ARTHROSCOPY  10/24/2002   rt and lt knee   ROTATOR CUFF REPAIR W/ DISTAL CLAVICLE EXCISION  01/06/2006   right   SHOULDER ARTHROSCOPY Left 10/24/2001   TONSILLECTOMY     TOTAL KNEE ARTHROPLASTY  03/29/2004   left   TOTAL KNEE ARTHROPLASTY  11/07/2003   right   TOTAL KNEE REVISION  07/30/2010   right; with lateral release   UPPER GASTROINTESTINAL ENDOSCOPY     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 I don't know when it started.  + family hx of leg swelling-mother  SUBJECTIVE:  SUBJECTIVE STATEMENT: [name] returns to OT to continue Intensive Phase CDT to LLE. Pt reports 5/10 pain in both knees. Pt is unaccompanied today. Pt reports she likes wearing the custom compression stocking on her L Leg. She tells me she is going to the beach with her family newxt week , so will miss appointments. We discussed how to manage compression garment wear regime and laundry care on facation. Pt plans to leave stocking off on the beach and don it after a shower to limit exposure to sand.   PAIN:  Are you having LE related pain? No: NPRS scale: 0/10 Pain location: feet Pain description: burning Aggravating factors: walking Relieving factors: elevation  PRECAUTIONS: Other: LYMPHEDEMA PRECAUTIONS  WEIGHT BEARING RESTRICTIONS: No  FALLS:  Has patient fallen in last 6 months? 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   OBJECTIVE: Note: Objective measures were completed at Evaluation unless otherwise noted.  COGNITION:  Overall cognitive status: frequent redirection needed during eval to stay on topic   POSTURE: WFL  LE ROM: grossly WFL  STRENGTH: grossly WFL   BLE COMPARATIVE LIMB VOLUMETRICS INITIAL 11/01/23  LANDMARK RIGHT  (dominant)  R LEG (A-D) 4296.6 ml  R THIGH (E-G) ml  R FULL LIMB (A-G) ml  Limb Volume differential (LVD)  %  Volume change since initial %  Volume change overall V  (Blank rows = not tested)  LANDMARK LEFT    L LEG (A-D) 4829.1 ml  L  THIGH (E-G) ml  L FULL LIMB (A-G) ml  Limb Volume differential (LVD)   12.4%, L>R  Volume change since initial %  Volume change overall %  (Blank rows = not tested)    9th Visit 12/06/23        LANDMARK LEFT    L LEG (A-D) 4888.8 ml  L  THIGH (E-G) ml  L FULL LIMB (A-G) ml  Limb Volume differential (LVD)     Volume change since initial  + 1.2 %  Volume change overall %  (Blank rows = not tested)    15 th Visit 01/10/24       LANDMARK LEFT    L LEG (A-D) 4637.8 ml  L  THIGH (E-G) ml  L FULL LIMB (A-G) ml  Limb Volume differential (LVD)     Volume change since last measured 12/06/23  DECREASED by 5.14%  Volume change overall %  (Blank rows = not tested)   20 th Visit- BLE COMPARATIVE LIMB VOLUMETRICS  02/01/24   LANDMARK LEFT    L LEG (A-D) 4139.2 ml  L  THIGH (E-G) ml  L FULL LIMB (A-G) ml  Limb Volume differential (LVD)     Volume change since initial DECREASED 10.8 %  Volume change overall %  (Blank rows = not tested)    30 th Visit- BLE COMPARATIVE LIMB VOLUMETRICS  03/25/24   LANDMARK LEFT    L LEG (A-D) 4173.5 ml  L  THIGH (E-G)  ml  L FULL LIMB (A-G) ml  Limb  Volume differential (LVD)     Volume change since LAST MEASURED ON 02/01/24  Inc BY 0.8 %  Volume change overall %  (Blank rows = not tested)    39 th Visit- LLE COMPARATIVE LIMB VOLUMETRICS  05/16/24   LANDMARK LEFT    L LEG (A-D) 4535.0 ml  L  THIGH (E-G) ml  L FULL LIMB (A-G) ml  Limb Volume differential (LVD)     Volume change since LAST MEASURED ON 03/25/24 INCREASED 8.7%  Volume change overall DECREASED 6.1%   (Blank rows = not tested)   Mild, Stage  II, 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 Obesity  Skin  Description Hyper-Keratosis Peau' de Orange Shiny Tight Fibrotic/ Indurated Fatty Doughy Spongy/ boggy   x    Bilateral Chronic Lipo dermatosclerosis Hardened, thickened, leathery      Skin dry Flaky WNL Macerated   mildly x     Color Redness Varicosities Blanching Hemosiderin Stain Mottled   x x x  Severe bilaterally x   Odor Malodorous Yeast Fungal infection  WNL      x   Temperature Warm Cool wnl    x     Pitting Edema   1+ 2+ 3+ 4+ Non-pitting         x   Girth Symmetrical Asymmetrical                   Distribution    L>R toes to popliteal     Stemmer Sign Positive Negative   +    Lymphorrhea History Of:  Present Absent     x    Wounds History Of Present Absent Venous Arterial Pressure Sheer     x        Signs of Infection Redness Warmth Erythema Acute Swelling Drainage Borders                    Sensation Light Touch Deep pressure Hypersensitivity Neuropathic pain   In Tact Impaired In tact Impaired Absent Impaired Plantar surfaces bilaterally   x  x  x      Nails WNL   Fungus nail dystrophy     x   Hair Growth Symmetrical Asymmetrical   x    Skin Creases Base of toes  Ankles   Base of Fingers knees       Abdominal pannus Thigh Lobules  Face/neck   x           TODAY'S TREATMENT:                                                                                                                                          Pt EDU FOR GARMENT DONNING AND DOFFING, WEAR AND CARE, ORDERING ROUTINE  PATIENT EDUCATION:  Continued Pt/ CG edu for lymphedema self care home program throughout session. Topics include outcome of comparative limb volumetrics- starting  limb volume differentials (LVDs), technology and gradient techniques used for short stretch, multilayer compression wrapping, simple self-MLD, therapeutic lymphatic pumping exercises, skin/nail care, LE precautions,. compression garment recommendations and specifications, wear and care schedule and compression garment donning / doffing w assistive devices. Discussed progress towards all OT goals since commencing CDT. All questions answered to the Pt's satisfaction. Good return. Person educated: Patient  Education method: Explanation, Demonstration, and Handouts Education comprehension: verbalized understanding, returned demonstration, verbal cues required, and needs further education  HOME EXERCISE PROGRAM: BLE lymphatic pumping there ex using- 1 sets of 10 reps, each exercise in order-  1-2 x daily, bilaterally Simple self MLD 1 x daily Daily skin care to increase hydration, skin mobility and decrease infection risk- can be done during MLD Compression wraps 23/7 until garment fitting complete   ASSESSMENT  CLINICAL IMPRESSION:  R Leg swelling is obviously reduced today after increased compliance with compression. Pt is tolerating the LLE compression stocking well. No remake needed. Fitting complete. Pt is wearing stocking as directed.  Pt tolerated RLE/RLQ MLD as established. Applied gradient compression wraps without increased pain. Cont 1 x weekly per Pt's preference.  (Initial Eval 09/13/23: Chaylee Ehrsam is a 68 y.o. female presenting with mild, BLE lymphedema 2.2 CVI and Obesity which contributes to and is affected by multiple co morbidities , including arthritis, fibromyalgia, HTN, obesity, plantar fasciitis and sleep  apnea. 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 Pt denies hx of known, chronic limb swelling in her family. She does not utilize compression garments, or a vasopneumatic compression pump as conservative measures. Chronic limb swelling and associated pain limits functional performance in all occupational domains, including functional ambulation and mobility, self-care and basic and instrumental ADLs. It limits participation in leisure pursuits, productive activities and social participle[ation.   Pt will benefit from  skilled Occupational Therapy for both  Intensive and Self -management phases of Complete Decongestive Therapy (CDT) to reduce limb swelling and associated pain, to limit progression, to reduce infection risk , and to increase functional performance, social participation and quality of life. CDT include manual lymphatic drainage (MLD), skin care, therapeutic exercise and compression therapies- multilayer bandaging initially, one limb at a time,  and then appropriate compression garments, or alternatives in the final phase. Pt and caregivers who assist are trained to perform a;; aspects of Lymphedema self-care top ensure optimal self management over time. Without skilled  OT for CDT, further progression of lymphedema is certain and further functional decline is expected.)   OBJECTIVE IMPAIRMENTS: decreased activity tolerance, decreased knowledge of condition, decreased knowledge of use of DME, increased edema, pain, and chronic leg swelling.   ACTIVITY LIMITATIONS: limited functional ambulation and functional mobility 2/2 lymphedema -related foot and leg pain; standing, squatting, stairs, bathing, toileting, dressing, and hygiene/grooming  PARTICIPATION LIMITATIONS: meal prep, cooking, cleaning, driving, shopping, community activity, and volunteering  PERSONAL FACTORS: Time since onset of injury/illness/exacerbation are also affecting patient's  functional outcome.   REHAB POTENTIAL: Good  CLINICAL DECISION MAKING: Evolving/moderate complexity  EVALUATION COMPLEXITY: Moderate   GOALS: Goals reviewed with patient? Yes  SHORT TERM GOALS: Target date: 4th OT Rx visit   Pt will demonstrate understanding of lymphedema precautions and prevention strategies with modified independence using a printed reference to identify at least 5 precautions and discussing how s/he may implement them into daily life to reduce risk of progression with extra time. Baseline:Max A Goal status: 11/20/23 GOAL MET  2.  Pt will be able to apply  multilayer, knee length, gradient, compression wraps to one leg at a time with max caregiver assistance to decrease limb volume, to limit infection risk, and to limit lymphedema progression.  Baseline: Dependent Goal status: 11/13/23 GOAL MET  LONG TERM GOALS: Target date: 12/12/22  Given this patient's Intake score of TBD % on the functional outcomes FOTO tool, patient will experience an increase in function of 5 points to improve basic and instrumental ADLs performance, including lymphedema self-care.  Baseline: Max A Goal status: GOAL DEFERRED. FOTO tool discontinued.  2.  Given this patient's Intake score of 64.71 on the Lymphedema Life Impact Scale (LLIS), patient will experience a reduction of at least 5 points in her perceived level of functional impairment resulting from lymphedema to improve functional performance and quality of life (QOL). Baseline: 64.71% Goal status: PROGRESSING  3.  Pt will achieve at least a 10% volume reduction in B legs to return limb to typical size and shape, to limit infection risk and LE progression, to decrease pain, to improve function. Baseline: Dependent Goal status: LLE GOAL PARTIALLY MET:  LLE comparative limb volumetrics on 05/16/24 reveal LLE volumetric increase  measuring 8.7% since last measured on 03/25/24. Overall the limb volume is decreased by 6.1%.  4.  Pt will  obtain proper compression garments/devices and achieve modified independence (extra time + assistive devices) with donning/doffing to optimize limb volume reductions and limit LE progression over time. Baseline:  Goal status: PARTIALLY MET. Initial LLE fitting 05/23/24 PLAN:  OT FREQUENCY: 1-2x/week  OT DURATION: 12 weeks and PRN  PLANNED INTERVENTIONS: 97110-Therapeutic exercises, 97530- Therapeutic activity, 97535- Self Care, [id number]- Manual therapy, Patient/Family education, Manual lymph drainage, Compression bandaging, DME instructions, and fitting with compression garments  Custom-made gradient compression garments and HOS devices are medically necessary because they are uniquely sized and shaped to fit the exact dimensions of the affected extremities, and to provide appropriate medical grade, graduated compression essential for optimally managing chronic, progressive lymphedema. Multiple custom compression garments are needed to ensure proper hygiene to limit infection risk. Custom compression garments should be replaced q 3-6 months When worn consistently for optimal lipo-lymphedema self-management over time. HOS devices, medically necessary to limit fibrosis buildup in tissue, should be replaced q 2 years and PRN when worn out.     Pt should be fit:  3 Left and 3 Right, custom, Jobst, flat knit, Elvarex Classic, ccl 2 (23-32 mmHg) knee length compression stockings with open toes to limit and control chronic leg swelling, to improve lymphatic circulation, to reduce infection risk, to limit formation of tissue fibrosis and to limit progression of lymphedema over time. Custom compression garments should be replaced q 3-6 months when worn consistently for optimal lymphedema self-management.   Pt should be fit with 1 R and 1 L custom, Jobst Relax, ccl 2, convoluted Hours-of-Sleep (HOS)  compression device to counteract fluid accumulation and fibrosis formation leading to lymphedema progression. at  night. HOS devices, medically necessary to limit fibrosis buildup in tissue, should be replaced q 2 years and PRN when worn out.    PLAN FOR NEXT SESSION:  RLE volumes RLE MLD RLE compression wrapping.  Complete LLE fitting  Zebedee Dec, MS, OTR/L, CLT-LANA 06/14/24 8:24 AM

## 2024-06-17 ENCOUNTER — Ambulatory Visit: Admitting: Occupational Therapy

## 2024-06-20 ENCOUNTER — Ambulatory Visit: Admitting: Occupational Therapy

## 2024-06-27 ENCOUNTER — Ambulatory Visit: Attending: Podiatry | Admitting: Occupational Therapy

## 2024-06-27 DIAGNOSIS — I890 Lymphedema, not elsewhere classified: Secondary | ICD-10-CM | POA: Insufficient documentation

## 2024-06-27 NOTE — Therapy (Signed)
 OUTPATIENT OCCUPATIONAL THERAPY TREATMENT NOTE   BILATERAL LOWER EXTREMITY LYMPHEDEMA Patient Name: [name] MRN: [id number] DOB:1956-10-24, 68 y.o., female Today's Date: 06/27/2024  REPORTING PERIOD:   END OF SESSION:   OT End of Session - 06/27/24 1423     Visit Number 44    Number of Visits 72    Date for OT Re-Evaluation 09/25/24    OT Start Time 0205    OT Stop Time 0305    OT Time Calculation (min) 60 min    Activity Tolerance Patient tolerated treatment well;No increased pain    Behavior During Therapy WFL for tasks assessed/performed          Past Medical History:  Diagnosis Date   Allergy    Anemia    Anxiety    Arthritis    Chronic venous insufficiency 12/04/2015   Depression    Fibromyalgia    History of chicken pox    HTN (hypertension)    MRSA (methicillin resistant Staphylococcus aureus)    Obese    Plantar fasciitis    Pneumonia    Seasonal allergies    Sleep apnea 2012   took test in [place]-told to use cpap-could not ude   Past Surgical History:  Procedure Laterality Date   ANTERIOR CERVICAL DECOMP/DISCECTOMY FUSION  05/09/2008   C5-6, C6-7; with arthrodesis also   GASTRIC BYPASS  10/24/2000   KNEE ARTHROSCOPY  10/24/2002   rt and lt knee   ROTATOR CUFF REPAIR W/ DISTAL CLAVICLE EXCISION  01/06/2006   right   SHOULDER ARTHROSCOPY Left 10/24/2001   TONSILLECTOMY     TOTAL KNEE ARTHROPLASTY  03/29/2004   left   TOTAL KNEE ARTHROPLASTY  11/07/2003   right   TOTAL KNEE REVISION  07/30/2010   right; with lateral release   UPPER GASTROINTESTINAL ENDOSCOPY     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 I don't know when it started.  + family hx of leg swelling-mother  SUBJECTIVE:  SUBJECTIVE STATEMENT: [name] returns to OT to continue Intensive Phase CDT to LLE. Pt reports 5/10 pain in both knees. Pt is accompanied by her spouse, Kristi, today. Pt reports she likes wearing the custom compression stocking on her L Leg. Pt enjoyed her beach holiday. Pt managed leg swelling    during vacation with wraps and stocking. She denies LE related pain today.  PAIN:  Are you having LE related pain? No: NPRS scale: 0/10 Pain location: feet Pain description: burning Aggravating factors: walking Relieving factors: elevation  PRECAUTIONS: Other: LYMPHEDEMA PRECAUTIONS  WEIGHT BEARING RESTRICTIONS: No  FALLS:  Has patient fallen in last 6 months? 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   OBJECTIVE: Note: Objective measures were completed at Evaluation unless otherwise noted.  COGNITION:  Overall cognitive status: frequent redirection needed during eval to stay on topic   POSTURE: WFL  LE ROM: grossly WFL  STRENGTH: grossly WFL   BLE COMPARATIVE LIMB VOLUMETRICS INITIAL 11/01/23  LANDMARK RIGHT  (dominant)  R LEG (A-D) 4296.6 ml  R THIGH (E-G) ml  R FULL LIMB (A-G) ml  Limb Volume differential (LVD)  %  Volume change since initial %  Volume change overall V  (Blank rows = not tested)  LANDMARK LEFT    L LEG (A-D) 4829.1 ml  L  THIGH (E-G) ml  L FULL LIMB (A-G) ml  Limb Volume differential (LVD)   12.4%, L>R  Volume change since initial %  Volume change overall %  (Blank rows = not tested)    9th Visit 12/06/23        LANDMARK LEFT    L LEG (A-D) 4888.8 ml  L  THIGH (E-G) ml  L FULL LIMB (A-G) ml  Limb Volume differential (LVD)     Volume change since initial  + 1.2 %  Volume change overall %  (Blank rows = not tested)    15 th Visit 01/10/24       LANDMARK LEFT    L LEG (A-D) 4637.8 ml  L  THIGH (E-G) ml  L FULL LIMB (A-G) ml  Limb Volume differential (LVD)     Volume change since last measured 12/06/23  DECREASED by 5.14%  Volume change overall %  (Blank rows = not tested)   20 th Visit- BLE COMPARATIVE LIMB VOLUMETRICS  02/01/24   LANDMARK LEFT    L LEG (A-D) 4139.2 ml  L  THIGH (E-G) ml  L FULL LIMB (A-G) ml  Limb Volume differential (LVD)     Volume change since initial DECREASED 10.8 %  Volume change overall %  (Blank rows = not tested)    30 th Visit- BLE COMPARATIVE LIMB VOLUMETRICS  03/25/24   LANDMARK LEFT    L LEG (A-D) 4173.5 ml  L  THIGH (E-G) ml  L FULL LIMB (A-G) ml  Limb Volume differential (LVD)     Volume change since LAST MEASURED ON 02/01/24  Inc BY 0.8 %   Volume change overall %  (Blank  rows = not tested)    39 th Visit- LLE COMPARATIVE LIMB VOLUMETRICS  05/16/24   LANDMARK LEFT    L LEG (A-D) 4535.0 ml  L  THIGH (E-G) ml  L FULL LIMB (A-G) ml  Limb Volume differential (LVD)     Volume change since LAST MEASURED ON 03/25/24 INCREASED 8.7%  Volume change overall DECREASED 6.1%   (Blank rows = not tested)   Mild, Stage  II, 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 Obesity  Skin  Description Hyper-Keratosis Peau' de Orange Shiny Tight Fibrotic/ Indurated Fatty Doughy Spongy/ boggy   x    Bilateral Chronic Lipo dermatosclerosis Hardened, thickened, leathery      Skin dry Flaky WNL Macerated   mildly x     Color Redness Varicosities Blanching Hemosiderin Stain Mottled   x x x  Severe bilaterally x   Odor Malodorous Yeast Fungal infection  WNL      x   Temperature Warm Cool wnl    x     Pitting Edema   1+ 2+ 3+ 4+ Non-pitting         x   Girth Symmetrical Asymmetrical                   Distribution    L>R toes to popliteal     Stemmer Sign Positive Negative   +    Lymphorrhea History Of:  Present Absent     x    Wounds History Of Present Absent Venous Arterial Pressure Sheer     x        Signs of Infection Redness Warmth Erythema Acute Swelling Drainage Borders                    Sensation Light Touch Deep pressure Hypersensitivity Neuropathic pain   In Tact Impaired In tact Impaired Absent Impaired Plantar surfaces bilaterally   x  x  x      Nails WNL   Fungus nail dystrophy     x   Hair Growth Symmetrical Asymmetrical   x    Skin Creases Base of toes  Ankles   Base of Fingers knees       Abdominal pannus Thigh Lobules  Face/neck   x           TODAY'S TREATMENT:                                                                                                                                         Pt EDU FOR GARMENT DONNING AND DOFFING, WEAR AND CARE, ORDERING ROUTINE  PATIENT  EDUCATION:  Continued Pt/ CG edu for lymphedema self care home program throughout session. Topics include outcome of comparative limb volumetrics- starting limb volume differentials (LVDs), technology and gradient techniques used for short stretch, multilayer compression wrapping, simple self-MLD, therapeutic lymphatic pumping exercises, skin/nail care, LE precautions,. compression  garment recommendations and specifications, wear and care schedule and compression garment donning / doffing w assistive devices. Discussed progress towards all OT goals since commencing CDT. All questions answered to the Pt's satisfaction. Good return. Person educated: Patient  Education method: Explanation, Demonstration, and Handouts Education comprehension: verbalized understanding, returned demonstration, verbal cues required, and needs further education  HOME EXERCISE PROGRAM: BLE lymphatic pumping there ex using- 1 sets of 10 reps, each exercise in order-  1-2 x daily, bilaterally Simple self MLD 1 x daily Daily skin care to increase hydration, skin mobility and decrease infection risk- can be done during MLD Compression wraps 23/7 until garment fitting complete   ASSESSMENT  CLINICAL IMPRESSION:  Completed initial RLE custom compression garment measurements and submitted to DME vendor. Reapplied com,pression wraps as established. Pt agrees to return to OT when R garments arrive for fitting.  (Initial Eval 09/13/23: Ahni Bradwell is a 68 y.o. 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 Pt denies hx of known, chronic limb swelling in her family. She does not utilize compression garments, or a vasopneumatic compression pump as conservative measures. Chronic limb  swelling and associated pain limits functional performance in all occupational domains, including functional ambulation and mobility, self-care and basic and instrumental ADLs. It limits participation in leisure pursuits, productive activities and social participle[ation.   Pt will benefit from  skilled Occupational Therapy for both  Intensive and Self -management phases of Complete Decongestive Therapy (CDT) to reduce limb swelling and associated pain, to limit progression, to reduce infection risk , and to increase functional performance, social participation and quality of life. CDT include manual lymphatic drainage (MLD), skin care, therapeutic exercise and compression therapies- multilayer bandaging initially, one limb at a time,  and then appropriate compression garments, or alternatives in the final phase. Pt and caregivers who assist are trained to perform a;; aspects of Lymphedema self-care top ensure optimal self management over time. Without skilled  OT for CDT, further progression of lymphedema is certain and further functional decline is expected.)   OBJECTIVE IMPAIRMENTS: decreased activity tolerance, decreased knowledge of condition, decreased knowledge of use of DME, increased edema, pain, and chronic leg swelling.   ACTIVITY LIMITATIONS: limited functional ambulation and functional mobility 2/2 lymphedema -related foot and leg pain; standing, squatting, stairs, bathing, toileting, dressing, and hygiene/grooming  PARTICIPATION LIMITATIONS: meal prep, cooking, cleaning, driving, shopping, community activity, and volunteering  PERSONAL FACTORS: Time since onset of injury/illness/exacerbation are also affecting patient's functional outcome.   REHAB POTENTIAL: Good  CLINICAL DECISION MAKING: Evolving/moderate complexity  EVALUATION COMPLEXITY: Moderate   GOALS: Goals reviewed with patient? Yes  SHORT TERM GOALS: Target date: 4th OT Rx visit   Pt will demonstrate understanding of  lymphedema precautions and prevention strategies with modified independence using a printed reference to identify at least 5 precautions and discussing how s/he may implement them into daily life to reduce risk of progression with extra time. Baseline:Max A Goal status: 11/20/23 GOAL MET  2.  Pt will be able to apply multilayer, knee length, gradient, compression wraps to one leg at a time with max caregiver assistance to decrease limb volume, to limit infection risk, and to limit lymphedema progression.  Baseline: Dependent Goal status: 11/13/23 GOAL MET  LONG TERM GOALS: Target date: 12/12/22  Given this patient's Intake  score of TBD % on the functional outcomes FOTO tool, patient will experience an increase in function of 5 points to improve basic and instrumental ADLs performance, including lymphedema self-care.  Baseline: Max A Goal status: GOAL DEFERRED. FOTO tool discontinued.  2.  Given this patient's Intake score of 64.71 on the Lymphedema Life Impact Scale (LLIS), patient will experience a reduction of at least 5 points in her perceived level of functional impairment resulting from lymphedema to improve functional performance and quality of life (QOL). Baseline: 64.71% Goal status: PROGRESSING  3.  Pt will achieve at least a 10% volume reduction in B legs to return limb to typical size and shape, to limit infection risk and LE progression, to decrease pain, to improve function. Baseline: Dependent Goal status: LLE GOAL PARTIALLY MET:  LLE comparative limb volumetrics on 05/16/24 reveal LLE volumetric increase  measuring 8.7% since last measured on 03/25/24. Overall the limb volume is decreased by 6.1%.  4.  Pt will obtain proper compression garments/devices and achieve modified independence (extra time + assistive devices) with donning/doffing to optimize limb volume reductions and limit LE progression over time. Baseline:  Goal status: PARTIALLY MET. Initial LLE fitting  05/23/24 PLAN:  OT FREQUENCY: 1-2x/week  OT DURATION: 12 weeks and PRN  PLANNED INTERVENTIONS: 97110-Therapeutic exercises, 97530- Therapeutic activity, 97535- Self Care, [id number]- Manual therapy, Patient/Family education, Manual lymph drainage, Compression bandaging, DME instructions, and fitting with compression garments  Custom-made gradient compression garments and HOS devices are medically necessary because they are uniquely sized and shaped to fit the exact dimensions of the affected extremities, and to provide appropriate medical grade, graduated compression essential for optimally managing chronic, progressive lymphedema. Multiple custom compression garments are needed to ensure proper hygiene to limit infection risk. Custom compression garments should be replaced q 3-6 months When worn consistently for optimal lipo-lymphedema self-management over time. HOS devices, medically necessary to limit fibrosis buildup in tissue, should be replaced q 2 years and PRN when worn out.     Pt should be fit:  3 Left and 3 Right, custom, Jobst, flat knit, Elvarex Classic, ccl 2 (23-32 mmHg) knee length compression stockings with open toes to limit and control chronic leg swelling, to improve lymphatic circulation, to reduce infection risk, to limit formation of tissue fibrosis and to limit progression of lymphedema over time. Custom compression garments should be replaced q 3-6 months when worn consistently for optimal lymphedema self-management.   Pt should be fit with 1 R and 1 L custom, Jobst Relax, ccl 2, convoluted Hours-of-Sleep (HOS)  compression device to counteract fluid accumulation and fibrosis formation leading to lymphedema progression. at night. HOS devices, medically necessary to limit fibrosis buildup in tissue, should be replaced q 2 years and PRN when worn out.    PLAN FOR NEXT SESSION:  Complete RLE custom garment fitting.  Zebedee Dec, MS, OTR/L, CLT-LANA 06/27/24 3:17 PM

## 2024-07-01 ENCOUNTER — Ambulatory Visit: Admitting: Occupational Therapy

## 2024-07-04 ENCOUNTER — Ambulatory Visit: Admitting: Occupational Therapy

## 2024-07-04 DIAGNOSIS — M25551 Pain in right hip: Secondary | ICD-10-CM | POA: Diagnosis not present

## 2024-07-04 DIAGNOSIS — M79644 Pain in right finger(s): Secondary | ICD-10-CM | POA: Diagnosis not present

## 2024-07-04 DIAGNOSIS — M549 Dorsalgia, unspecified: Secondary | ICD-10-CM | POA: Diagnosis not present

## 2024-07-04 DIAGNOSIS — M7060 Trochanteric bursitis, unspecified hip: Secondary | ICD-10-CM | POA: Diagnosis not present

## 2024-07-04 DIAGNOSIS — M2550 Pain in unspecified joint: Secondary | ICD-10-CM | POA: Diagnosis not present

## 2024-07-04 DIAGNOSIS — M1990 Unspecified osteoarthritis, unspecified site: Secondary | ICD-10-CM | POA: Diagnosis not present

## 2024-07-04 DIAGNOSIS — M797 Fibromyalgia: Secondary | ICD-10-CM | POA: Diagnosis not present

## 2024-07-04 DIAGNOSIS — L4050 Arthropathic psoriasis, unspecified: Secondary | ICD-10-CM | POA: Diagnosis not present

## 2024-07-04 DIAGNOSIS — Z872 Personal history of diseases of the skin and subcutaneous tissue: Secondary | ICD-10-CM | POA: Diagnosis not present

## 2024-07-04 DIAGNOSIS — Z79899 Other long term (current) drug therapy: Secondary | ICD-10-CM | POA: Diagnosis not present

## 2024-07-08 ENCOUNTER — Ambulatory Visit: Admitting: Occupational Therapy

## 2024-07-09 DIAGNOSIS — I890 Lymphedema, not elsewhere classified: Secondary | ICD-10-CM | POA: Diagnosis not present

## 2024-07-11 ENCOUNTER — Ambulatory Visit: Admitting: Occupational Therapy

## 2024-07-15 ENCOUNTER — Other Ambulatory Visit: Payer: Self-pay

## 2024-07-15 ENCOUNTER — Other Ambulatory Visit: Payer: Self-pay | Admitting: General Practice

## 2024-07-15 ENCOUNTER — Other Ambulatory Visit (HOSPITAL_COMMUNITY): Payer: Self-pay

## 2024-07-15 ENCOUNTER — Ambulatory Visit: Admitting: Occupational Therapy

## 2024-07-15 NOTE — Telephone Encounter (Signed)
 Rx was given by another provider; okay to refill?

## 2024-07-17 ENCOUNTER — Other Ambulatory Visit (HOSPITAL_COMMUNITY): Payer: Self-pay

## 2024-07-18 ENCOUNTER — Other Ambulatory Visit (HOSPITAL_COMMUNITY): Payer: Self-pay

## 2024-07-19 ENCOUNTER — Other Ambulatory Visit (HOSPITAL_COMMUNITY): Payer: Self-pay

## 2024-07-19 NOTE — Telephone Encounter (Signed)
 LMTCB

## 2024-07-22 ENCOUNTER — Telehealth: Payer: Self-pay | Admitting: General Practice

## 2024-07-22 NOTE — Telephone Encounter (Signed)
Noted.  Request denied.  

## 2024-07-22 NOTE — Telephone Encounter (Signed)
 Copied from CRM [id number]. Topic: Clinical - Medication Question >> Jul 19, 2024  5:07 PM Drema MATSU wrote: Reason for CRM: Patient stated that she takes tiZANidine  HCl with her morphine  twice a day. She stated to disregard that it is a muscle relaxer and she gets it from her pain doctor. She is going to call her pain doctor on Monday. Pharmacy was suppose to send request to them.

## 2024-07-23 ENCOUNTER — Other Ambulatory Visit (HOSPITAL_COMMUNITY): Payer: Self-pay

## 2024-07-30 ENCOUNTER — Other Ambulatory Visit (HOSPITAL_COMMUNITY): Payer: Self-pay

## 2024-07-31 ENCOUNTER — Other Ambulatory Visit (HOSPITAL_COMMUNITY): Payer: Self-pay

## 2024-07-31 DIAGNOSIS — M150 Primary generalized (osteo)arthritis: Secondary | ICD-10-CM | POA: Diagnosis not present

## 2024-07-31 DIAGNOSIS — M62831 Muscle spasm of calf: Secondary | ICD-10-CM | POA: Diagnosis not present

## 2024-07-31 DIAGNOSIS — G894 Chronic pain syndrome: Secondary | ICD-10-CM | POA: Diagnosis not present

## 2024-07-31 DIAGNOSIS — M25551 Pain in right hip: Secondary | ICD-10-CM | POA: Diagnosis not present

## 2024-07-31 MED ORDER — TRAZODONE HCL 50 MG PO TABS
50.0000 mg | ORAL_TABLET | Freq: Every day | ORAL | 2 refills | Status: AC
  Filled 2024-07-31 – 2024-08-14 (×2): qty 30, 30d supply, fill #0

## 2024-07-31 MED ORDER — MORPHINE SULFATE ER 30 MG PO TBCR
30.0000 mg | EXTENDED_RELEASE_TABLET | Freq: Two times a day (BID) | ORAL | 0 refills | Status: AC
  Filled 2024-09-10 – 2024-09-11 (×2): qty 60, 30d supply, fill #0

## 2024-07-31 MED ORDER — MORPHINE SULFATE ER 30 MG PO TBCR
30.0000 mg | EXTENDED_RELEASE_TABLET | Freq: Two times a day (BID) | ORAL | 0 refills | Status: AC
  Filled 2024-08-12: qty 60, 30d supply, fill #0

## 2024-07-31 MED ORDER — TIZANIDINE HCL 2 MG PO TABS
2.0000 mg | ORAL_TABLET | Freq: Three times a day (TID) | ORAL | 2 refills | Status: AC | PRN
  Filled 2024-07-31 – 2024-08-14 (×2): qty 90, 30d supply, fill #0
  Filled 2024-09-11: qty 90, 30d supply, fill #1

## 2024-08-05 ENCOUNTER — Ambulatory Visit: Attending: Podiatry | Admitting: Occupational Therapy

## 2024-08-05 ENCOUNTER — Other Ambulatory Visit (HOSPITAL_COMMUNITY): Payer: Self-pay

## 2024-08-05 DIAGNOSIS — I890 Lymphedema, not elsewhere classified: Secondary | ICD-10-CM | POA: Insufficient documentation

## 2024-08-05 NOTE — Therapy (Signed)
 OUTPATIENT OCCUPATIONAL THERAPY TREATMENT NOTE and DISCHARGE SUMMARY   BILATERAL LOWER EXTREMITY LYMPHEDEMA  Patient Name: [name] MRN: [id number] DOB:[birth year], 68 y.o., female Today's Date: 08/05/2024  REPORTING PERIOD:   END OF SESSION:   OT End of Session - 08/05/24 1522     Visit Number 45    Number of Visits 72    Date for Recertification  09/25/24    OT Start Time 0300    OT Stop Time 0355    OT Time Calculation (min) 55 min    Activity Tolerance Patient tolerated treatment well;No increased pain    Behavior During Therapy WFL for tasks assessed/performed          Past Medical History:  Diagnosis Date   Allergy    Anemia    Anxiety    Arthritis    Chronic venous insufficiency 12/04/2015   Depression    Fibromyalgia    History of chicken pox    HTN (hypertension)    MRSA (methicillin resistant Staphylococcus aureus)    Obese    Plantar fasciitis    Pneumonia    Seasonal allergies    Sleep apnea 2012   took test in [place]-told to use cpap-could not ude   Past Surgical History:  Procedure Laterality Date   ANTERIOR CERVICAL DECOMP/DISCECTOMY FUSION  05/09/2008   C5-6, C6-7; with arthrodesis also   GASTRIC BYPASS  10/24/2000   KNEE ARTHROSCOPY  10/24/2002   rt and lt knee   ROTATOR CUFF REPAIR W/ DISTAL CLAVICLE EXCISION  01/06/2006   right   SHOULDER ARTHROSCOPY Left 10/24/2001   TONSILLECTOMY     TOTAL KNEE ARTHROPLASTY  03/29/2004   left   TOTAL KNEE ARTHROPLASTY  11/07/2003   right   TOTAL KNEE REVISION  07/30/2010   right; with lateral release   UPPER GASTROINTESTINAL ENDOSCOPY     Patient Active Problem List   Diagnosis Date Noted   Venous stasis ulcer of other part of left lower leg limited to breakdown of skin with varicose veins (HCC) 12/28/2023   Stage 3a chronic kidney disease (HCC) 12/28/2023   Essential hypertension 02/14/2022   CAP (community acquired pneumonia) 06/09/2017   ADHD, predominantly inattentive type  04/03/2017   Insomnia    Psoriatic arthritis (HCC)    Cough    Chronic anemia 12/05/2015   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 I don't know when it started.  + family hx of leg swelling-mother  SUBJECTIVE:  SUBJECTIVE STATEMENT: [name] returns to OT for follow up lymphedema care. Pt was last seen on 06/27/24 for RLE custom compression garment measurements. Pt denies LE related leg pain. She reports she likes her compression stockings and wears them every day.  PAIN:  Are you having LE related pain? No: NPRS scale: 0/10 Pain location: feet Pain description: burning Aggravating factors: walking Relieving factors: elevation  PRECAUTIONS: Other: LYMPHEDEMA PRECAUTIONS  WEIGHT BEARING RESTRICTIONS: No  FALLS:  Has patient fallen in last 6 months? 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   OBJECTIVE: Note: Objective measures were completed at Evaluation unless otherwise noted.  COGNITION:  Overall cognitive status: frequent redirection needed during eval to stay on topic   POSTURE: WFL  LE ROM: grossly WFL  STRENGTH: grossly WFL   BLE COMPARATIVE LIMB VOLUMETRICS INITIAL 11/01/23  LANDMARK RIGHT  (dominant)  R LEG (A-D) 4296.6 ml  R THIGH (E-G) ml  R FULL LIMB (A-G) ml  Limb Volume differential (LVD)  %  Volume change since initial %  Volume change overall V  (Blank rows = not tested)  LANDMARK LEFT    L LEG (A-D) 4829.1 ml  L  THIGH (E-G) ml  L FULL LIMB (A-G) ml  Limb Volume differential (LVD)   12.4%, L>R  Volume change since initial %  Volume change overall %  (Blank rows = not tested)    9th Visit 12/06/23        LANDMARK LEFT    L LEG (A-D) 4888.8 ml  L  THIGH (E-G) ml  L FULL LIMB (A-G) ml  Limb Volume differential (LVD)     Volume change since initial  + 1.2 %  Volume change overall %  (Blank rows = not tested)    15 th Visit 01/10/24       LANDMARK LEFT    L LEG (A-D) 4637.8 ml  L  THIGH (E-G) ml  L FULL LIMB (A-G) ml  Limb Volume differential (LVD)     Volume change since last measured 12/06/23  DECREASED by 5.14%  Volume change overall %  (Blank rows = not tested)   20 th Visit- BLE COMPARATIVE LIMB VOLUMETRICS  02/01/24   LANDMARK LEFT    L LEG (A-D) 4139.2 ml  L  THIGH (E-G) ml  L FULL LIMB (A-G) ml  Limb Volume differential (LVD)     Volume change since initial DECREASED 10.8 %  Volume change overall %  (Blank rows = not tested)    30 th Visit- BLE COMPARATIVE LIMB VOLUMETRICS  03/25/24   LANDMARK LEFT    L LEG (A-D) 4173.5 ml  L  THIGH (E-G) ml  L FULL LIMB (A-G) ml  Limb Volume differential (LVD)     Volume change since LAST MEASURED ON 02/01/24  Inc BY 0.8 %  Volume change overall %  (Blank rows = not tested)    39 th Visit- LLE COMPARATIVE LIMB  VOLUMETRICS  05/16/24   LANDMARK LEFT    L  LEG (A-D) 4535.0 ml  L  THIGH (E-G) ml  L FULL LIMB (A-G) ml  Limb Volume differential (LVD)     Volume change since LAST MEASURED ON 03/25/24 INCREASED 8.7%  Volume change overall DECREASED 6.1%   (Blank rows = not tested)   Mild, Stage  II, 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 Obesity  Skin  Description Hyper-Keratosis Peau' de Orange Shiny Tight Fibrotic/ Indurated Fatty Doughy Spongy/ boggy   x    Bilateral Chronic Lipo dermatosclerosis Hardened, thickened, leathery      Skin dry Flaky WNL Macerated   mildly x     Color Redness Varicosities Blanching Hemosiderin Stain Mottled   x x x  Severe bilaterally x   Odor Malodorous Yeast Fungal infection  WNL      x   Temperature Warm Cool wnl    x     Pitting Edema   1+ 2+ 3+ 4+ Non-pitting         x   Girth Symmetrical Asymmetrical                   Distribution    L>R toes to popliteal     Stemmer Sign Positive Negative   +    Lymphorrhea History Of:  Present Absent     x    Wounds History Of Present Absent Venous Arterial Pressure Sheer     x        Signs of Infection Redness Warmth Erythema Acute Swelling Drainage Borders                    Sensation Light Touch Deep pressure Hypersensitivity Neuropathic pain   In Tact Impaired In tact Impaired Absent Impaired Plantar surfaces bilaterally   x  x  x      Nails WNL   Fungus nail dystrophy     x   Hair Growth Symmetrical Asymmetrical   x    Skin Creases Base of toes  Ankles   Base of Fingers knees       Abdominal pannus Thigh Lobules  Face/neck   x           TODAY'S TREATMENT:                                                                                                                                         Pt f/u for BLE lymphedema: garment assessment, swelling and containment AOK. Good compliance Pt edu for constructing a donning Tyvek donning aid Pt edu for garment replacement  schedule, OT's role, Vendor's role  PATIENT EDUCATION:  Continued Pt/ CG edu for lymphedema self care home program throughout session. Topics include outcome of comparative limb volumetrics- starting limb volume differentials (LVDs), technology and gradient techniques used for short stretch, multilayer compression wrapping, simple self-MLD, therapeutic lymphatic pumping exercises, skin/nail care, LE precautions,. compression garment recommendations and specifications,  wear and care schedule and compression garment donning / doffing w assistive devices. Discussed progress towards all OT goals since commencing CDT. All questions answered to the Pt's satisfaction. Good return. Person educated: Patient  Education method: Explanation, Demonstration, and Handouts Education comprehension: verbalized understanding, returned demonstration, verbal cues required, and needs further education  HOME EXERCISE PROGRAM: BLE lymphatic pumping there ex using- 1 sets of 10 reps, each exercise in order-  1-2 x daily, bilaterally Simple self MLD 1 x daily Daily skin care to increase hydration, skin mobility and decrease infection risk- can be done during MLD Compression wraps 23/7 until garment fitting complete   ASSESSMENT  CLINICAL IMPRESSION:  Leg swelling is well managed today. Garments provide appropriate compression, containment, and Pt is able to don them independently. She is unable to doff them without assistance from her spouse. Pt is compliant with skin care and daily compression as directed. Pt has met all OT goals for lymphedema self management, except for volume reduction goal. Pt is DC from OT for CDT. She will call DME vendor to order remaining RLE garments and she will call to schedule follow up PRN.   (Initial Eval 09/13/23: Shanikqua Zarzycki is a 68 y.o. female presenting with mild, BLE lymphedema 2.2 CVI and Obesity which contributes to and is affected by multiple co morbidities , including arthritis,  fibromyalgia, HTN, obesity, plantar fasciitis and sleep apnea. 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 Pt denies hx of known, chronic limb swelling in her family. She does not utilize compression garments, or a vasopneumatic compression pump as conservative measures. Chronic limb swelling and associated pain limits functional performance in all occupational domains, including functional ambulation and mobility, self-care and basic and instrumental ADLs. It limits participation in leisure pursuits, productive activities and social participle[ation.   Pt will benefit from  skilled Occupational Therapy for both  Intensive and Self -management phases of Complete Decongestive Therapy (CDT) to reduce limb swelling and associated pain, to limit progression, to reduce infection risk , and to increase functional performance, social participation and quality of life. CDT include manual lymphatic drainage (MLD), skin care, therapeutic exercise and compression therapies- multilayer bandaging initially, one limb at a time,  and then appropriate compression garments, or alternatives in the final phase. Pt and caregivers who assist are trained to perform a;; aspects of Lymphedema self-care top ensure optimal self management over time. Without skilled  OT for CDT, further progression of lymphedema is certain and further functional decline is expected.)   OBJECTIVE IMPAIRMENTS: decreased activity tolerance, decreased knowledge of condition, decreased knowledge of use of DME, increased edema, pain, and chronic leg swelling.   ACTIVITY LIMITATIONS: limited functional ambulation and functional mobility 2/2 lymphedema -related foot and leg pain; standing, squatting, stairs, bathing, toileting, dressing, and hygiene/grooming  PARTICIPATION LIMITATIONS: meal prep, cooking, cleaning, driving, shopping, community activity, and volunteering  PERSONAL FACTORS: Time since onset of  injury/illness/exacerbation are also affecting patient's functional outcome.   REHAB POTENTIAL: Good  CLINICAL DECISION MAKING: Evolving/moderate complexity  EVALUATION COMPLEXITY: Moderate   GOALS: Goals reviewed with patient? Yes  SHORT TERM GOALS: Target date: 4th OT Rx visit   Pt will demonstrate understanding of lymphedema precautions and prevention strategies with modified independence using a printed reference to identify at least 5 precautions and discussing how s/he may implement them into daily life to reduce risk of progression with extra time. Baseline:Max A Goal status: 11/20/23 GOAL MET  2.  Pt will be able to  apply multilayer, knee length, gradient, compression wraps to one leg at a time with max caregiver assistance to decrease limb volume, to limit infection risk, and to limit lymphedema progression.  Baseline: Dependent Goal status: 11/13/23 GOAL MET  LONG TERM GOALS: Target date: 12/12/22  Given this patient's Intake score of TBD % on the functional outcomes FOTO tool, patient will experience an increase in function of 5 points to improve basic and instrumental ADLs performance, including lymphedema self-care.  Baseline: Max A Goal status: GOAL DEFERRED. FOTO tool discontinued.  2.  Given this patient's Intake score of 64.71 on the Lymphedema Life Impact Scale (LLIS), patient will experience a reduction of at least 5 points in her perceived level of functional impairment resulting from lymphedema to improve functional performance and quality of life (QOL). Baseline: 64.71% Goal status: GOAL MET. Final score reduced by 57.36 points to 7.35%  3.  Pt will achieve at least a 10% volume reduction in B legs to return limb to typical size and shape, to limit infection risk and LE progression, to decrease pain, to improve function. Baseline: Dependent Goal status: LLE GOAL PARTIALLY MET:  LLE comparative limb volumetrics on 05/16/24 reveal LLE volumetric increase  measuring  8.7% since last measured on 03/25/24. Overall the limb volume is decreased by 6.1%.  4.  Pt will obtain proper compression garments/devices and achieve modified independence (extra time + assistive devices) with donning/doffing to optimize limb volume reductions and limit LE progression over time. Baseline:  Goal status: GOAL MET PLAN:  OT FREQUENCY: DC OT this date  OT DURATION: PRN  PLANNED INTERVENTIONS: 97110-Therapeutic exercises, 97530- Therapeutic activity, 97535- Self Care, [id number]- Manual therapy, Patient/Family education, Manual lymph drainage, Compression bandaging, DME instructions, and fitting with compression garments  Custom-made gradient compression garments and HOS devices are medically necessary because they are uniquely sized and shaped to fit the exact dimensions of the affected extremities, and to provide appropriate medical grade, graduated compression essential for optimally managing chronic, progressive lymphedema. Multiple custom compression garments are needed to ensure proper hygiene to limit infection risk. Custom compression garments should be replaced q 3-6 months When worn consistently for optimal lipo-lymphedema self-management over time. HOS devices, medically necessary to limit fibrosis buildup in tissue, should be replaced q 2 years and PRN when worn out.     Pt should be fit:  3 Left and 3 Right, custom, Jobst, flat knit, Elvarex Classic, ccl 2 (23-32 mmHg) knee length compression stockings with open toes to limit and control chronic leg swelling, to improve lymphatic circulation, to reduce infection risk, to limit formation of tissue fibrosis and to limit progression of lymphedema over time. Custom compression garments should be replaced q 3-6 months when worn consistently for optimal lymphedema self-management.   Pt should be fit with 1 R and 1 L custom, Jobst Relax, ccl 2, convoluted Hours-of-Sleep (HOS)  compression device to counteract fluid accumulation and  fibrosis formation leading to lymphedema progression. at night. HOS devices, medically necessary to limit fibrosis buildup in tissue, should be replaced q 2 years and PRN when worn out.    PLAN FOR NEXT SESSION:  N/A Pt is DC from OT  Zebedee Dec, MS, OTR/L, CLT-LANA 08/05/24

## 2024-08-07 ENCOUNTER — Other Ambulatory Visit (HOSPITAL_COMMUNITY): Payer: Self-pay

## 2024-08-08 ENCOUNTER — Encounter: Admitting: Occupational Therapy

## 2024-08-09 ENCOUNTER — Other Ambulatory Visit (HOSPITAL_COMMUNITY): Payer: Self-pay

## 2024-08-09 ENCOUNTER — Other Ambulatory Visit: Payer: Self-pay

## 2024-08-10 ENCOUNTER — Other Ambulatory Visit (HOSPITAL_COMMUNITY): Payer: Self-pay

## 2024-08-12 ENCOUNTER — Other Ambulatory Visit (HOSPITAL_COMMUNITY): Payer: Self-pay

## 2024-08-14 ENCOUNTER — Other Ambulatory Visit (HOSPITAL_COMMUNITY): Payer: Self-pay

## 2024-08-15 ENCOUNTER — Encounter: Admitting: Occupational Therapy

## 2024-08-19 ENCOUNTER — Other Ambulatory Visit: Payer: Self-pay | Admitting: General Practice

## 2024-08-19 DIAGNOSIS — F411 Generalized anxiety disorder: Secondary | ICD-10-CM

## 2024-08-22 ENCOUNTER — Encounter: Admitting: Occupational Therapy

## 2024-08-28 ENCOUNTER — Encounter: Admitting: Occupational Therapy

## 2024-09-04 ENCOUNTER — Encounter: Admitting: Occupational Therapy

## 2024-09-06 ENCOUNTER — Other Ambulatory Visit (HOSPITAL_COMMUNITY): Payer: Self-pay

## 2024-09-10 ENCOUNTER — Ambulatory Visit (INDEPENDENT_AMBULATORY_CARE_PROVIDER_SITE_OTHER): Admitting: General Practice

## 2024-09-10 ENCOUNTER — Other Ambulatory Visit: Payer: Self-pay

## 2024-09-10 ENCOUNTER — Ambulatory Visit: Payer: Self-pay | Admitting: General Practice

## 2024-09-10 ENCOUNTER — Encounter: Payer: Self-pay | Admitting: General Practice

## 2024-09-10 ENCOUNTER — Other Ambulatory Visit (HOSPITAL_COMMUNITY): Payer: Self-pay

## 2024-09-10 VITALS — BP 134/82 | HR 83 | Temp 97.7°F | Ht 66.0 in | Wt 244.0 lb

## 2024-09-10 DIAGNOSIS — Z0000 Encounter for general adult medical examination without abnormal findings: Secondary | ICD-10-CM

## 2024-09-10 DIAGNOSIS — E66812 Obesity, class 2: Secondary | ICD-10-CM

## 2024-09-10 DIAGNOSIS — I10 Essential (primary) hypertension: Secondary | ICD-10-CM | POA: Diagnosis not present

## 2024-09-10 DIAGNOSIS — F1010 Alcohol abuse, uncomplicated: Secondary | ICD-10-CM | POA: Diagnosis not present

## 2024-09-10 DIAGNOSIS — F411 Generalized anxiety disorder: Secondary | ICD-10-CM | POA: Diagnosis not present

## 2024-09-10 DIAGNOSIS — D649 Anemia, unspecified: Secondary | ICD-10-CM

## 2024-09-10 DIAGNOSIS — Z6839 Body mass index (BMI) 39.0-39.9, adult: Secondary | ICD-10-CM

## 2024-09-10 DIAGNOSIS — E559 Vitamin D deficiency, unspecified: Secondary | ICD-10-CM

## 2024-09-10 DIAGNOSIS — F32A Depression, unspecified: Secondary | ICD-10-CM

## 2024-09-10 DIAGNOSIS — Z1231 Encounter for screening mammogram for malignant neoplasm of breast: Secondary | ICD-10-CM

## 2024-09-10 LAB — TSH: TSH: 3.48 u[IU]/mL (ref 0.35–5.50)

## 2024-09-10 LAB — COMPREHENSIVE METABOLIC PANEL WITH GFR
ALT: 14 U/L (ref 0–35)
AST: 17 U/L (ref 0–37)
Albumin: 4.2 g/dL (ref 3.5–5.2)
Alkaline Phosphatase: 88 U/L (ref 39–117)
BUN: 11 mg/dL (ref 6–23)
CO2: 32 meq/L (ref 19–32)
Calcium: 8.8 mg/dL (ref 8.4–10.5)
Chloride: 96 meq/L (ref 96–112)
Creatinine, Ser: 0.92 mg/dL (ref 0.40–1.20)
GFR: 64.58 mL/min (ref >60.00)
Glucose, Bld: 84 mg/dL (ref 70–99)
Potassium: 4.4 meq/L (ref 3.5–5.1)
Sodium: 136 meq/L (ref 135–145)
Total Bilirubin: 0.6 mg/dL (ref 0.2–1.2)
Total Protein: 6.2 g/dL (ref 6.0–8.3)

## 2024-09-10 LAB — LIPID PANEL
Cholesterol: 154 mg/dL (ref 0–200)
HDL: 84.8 mg/dL (ref >39.00)
LDL Cholesterol: 56 mg/dL (ref 0–99)
NonHDL: 69.03
Total CHOL/HDL Ratio: 2
Triglycerides: 63 mg/dL (ref 0.0–149.0)
VLDL: 12.6 mg/dL (ref 0.0–40.0)

## 2024-09-10 LAB — IBC + FERRITIN
Ferritin: 82.2 ng/mL (ref 10.0–291.0)
Iron: 90 ug/dL (ref 42–145)
Saturation Ratios: 26.8 % (ref 20.0–50.0)
TIBC: 336 ug/dL (ref 250.0–450.0)
Transferrin: 240 mg/dL (ref 212.0–360.0)

## 2024-09-10 LAB — CBC
HCT: 42.4 % (ref 36.0–46.0)
Hemoglobin: 14 g/dL (ref 12.0–15.0)
MCHC: 33.1 g/dL (ref 30.0–36.0)
MCV: 99.9 fl (ref 78.0–100.0)
Platelets: 340 K/uL (ref 150.0–400.0)
RBC: 4.25 Mil/uL (ref 3.87–5.11)
RDW: 14.5 % (ref 11.5–15.5)
WBC: 4.3 K/uL (ref 4.0–10.5)

## 2024-09-10 LAB — VITAMIN D 25 HYDROXY (VIT D DEFICIENCY, FRACTURES): VITD: 40.17 ng/mL (ref 30.00–100.00)

## 2024-09-10 LAB — HEMOGLOBIN A1C: Hgb A1c MFr Bld: 5.1 % (ref 4.6–6.5)

## 2024-09-10 MED ORDER — HYDROXYZINE PAMOATE 25 MG PO CAPS
25.0000 mg | ORAL_CAPSULE | Freq: Three times a day (TID) | ORAL | 2 refills | Status: AC | PRN
Start: 2024-09-10 — End: ?

## 2024-09-10 MED ORDER — BOOSTRIX 5-2.5-18.5 LF-MCG/0.5 IM SUSY
PREFILLED_SYRINGE | INTRAMUSCULAR | 0 refills | Status: AC
  Filled 2024-09-10: qty 0.5, 1d supply, fill #0

## 2024-09-10 NOTE — Assessment & Plan Note (Signed)
 Immunizations tdap given today by the pharmacy clinic; other UTD. Mammogram due, orders placed. Colonoscopy UTD Dexa - UTD.  Discussed the importance of a healthy diet and regular exercise in order for weight loss, and to reduce the risk of further co-morbidity.  Exam stable. Labs pending.  Follow up in 1 year for repeat physical.

## 2024-09-10 NOTE — Assessment & Plan Note (Signed)
 Controlled.

## 2024-09-10 NOTE — Patient Instructions (Addendum)
 Stop by the lab prior to leaving today. I will notify you of your results once received.   Follow up in 6 months.   It was a pleasure to see you today!

## 2024-09-10 NOTE — Assessment & Plan Note (Signed)
 Cbc and iron studies pending.

## 2024-09-10 NOTE — Progress Notes (Signed)
 Established Patient Office Visit  Subjective   Patient ID: [name], female    DOB: 1955-11-08  Age: 68 y.o. MRN: [id number]  Chief Complaint  Patient presents with   Employment Physical    HPI  [name] is a 68 year old female with past medical history of HTN, CAP, psoriatic arthritis, CKD, alcohol abuse, depression disorder, ADHD, fibromyalgia, GAD presents today for complete physical and follow up of chronic conditions.  Immunizations: -Tetanus: due -Influenza: completed -Shingles: Completed Shingrix series -Pneumonia: Completed   Diet: Fair diet.  Exercise: No regular exercise.  Eye exam: Completed several years ago.  Dental exam: Completes semi-annually    Mammogram: Completed in 2024 Bone Density Scan: Completed in 2023  Colonoscopy: Completed in 2022   Patient Active Problem List   Diagnosis Date Noted   Encounter for screening and preventative care 09/10/2024   Venous stasis ulcer of other part of left lower leg limited to breakdown of skin with varicose veins (HCC) 12/28/2023   Stage 3a chronic kidney disease (HCC) 12/28/2023   Essential hypertension 02/14/2022   ADHD, 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   Insomnia    Psoriatic arthritis (HCC)    Chronic anemia 12/05/2015   Hoarding disorder 03/05/2015   Alcohol abuse 11/21/2014   Obesity 11/21/2014   Hematemesis 03/07/2014   Depressive disorder 12/09/2013   GAD (generalized anxiety disorder) 09/17/2013   Fibromyalgia 07/22/2013   Past Medical History:  Diagnosis Date   Allergy    Anemia    Anxiety    Arthritis    Chronic venous insufficiency 12/04/2015   Depression    Fibromyalgia    History of chicken pox    HTN (hypertension)    MRSA (methicillin resistant Staphylococcus aureus)    Obese    Plantar fasciitis    Pneumonia    Seasonal allergies    Sleep apnea 2012   took test in [place]-told to use cpap-could not ude   Past Surgical History:  Procedure Laterality Date    ANTERIOR CERVICAL DECOMP/DISCECTOMY FUSION  05/09/2008   C5-6, C6-7; with arthrodesis also   GASTRIC BYPASS  10/24/2000   KNEE ARTHROSCOPY  10/24/2002   rt and lt knee   ROTATOR CUFF REPAIR W/ DISTAL CLAVICLE EXCISION  01/06/2006   right   SHOULDER ARTHROSCOPY Left 10/24/2001   TONSILLECTOMY     TOTAL KNEE ARTHROPLASTY  03/29/2004   left   TOTAL KNEE ARTHROPLASTY  11/07/2003   right   TOTAL KNEE REVISION  07/30/2010   right; with lateral release   UPPER GASTROINTESTINAL ENDOSCOPY     Allergies  Allergen Reactions   Infliximab Other (See Comments)         06/06/2024   11:13 AM 05/03/2024    1:59 PM 04/10/2024   12:24 PM  Depression screen PHQ 2/9  Decreased Interest 1 1 1   Down, Depressed, Hopeless 1 1 1   PHQ - 2 Score 2 2 2   Altered sleeping 1 0 1  Tired, decreased energy 3 1 2   Change in appetite 1 1 0  Feeling bad or failure about yourself  1 0 0  Trouble concentrating 1 0 1  Moving slowly or fidgety/restless 0 0 0  Suicidal thoughts 0 0 0  PHQ-9 Score 9  4  6    Difficult doing work/chores Not difficult at all Somewhat difficult Somewhat difficult     Data saved with a previous flowsheet row definition       06/06/2024   11:13 AM  04/10/2024   12:24 PM 03/11/2024   11:41 AM 12/28/2023   12:25 PM  GAD 7 : Generalized Anxiety Score  Nervous, Anxious, on Edge 1 1 0 0  Control/stop worrying 0 0 0 0  Worry too much - different things 1 1 1  0  Trouble relaxing 0 1 0 0  Restless 0 1 0 0  Easily annoyed or irritable 0 1 0 0  Afraid - awful might happen 0 0 0 0  Total GAD 7 Score 2 5 1  0  Anxiety Difficulty Not difficult at all Not difficult at all Not difficult at all Not difficult at all      Review of Systems  Constitutional:  Negative for chills, fever, malaise/fatigue and weight loss.  HENT:  Negative for congestion, ear discharge, ear pain, hearing loss, nosebleeds, sinus pain, sore throat and tinnitus.   Eyes:  Negative for blurred vision, double vision,  pain, discharge and redness.  Respiratory:  Negative for cough, shortness of breath, wheezing and stridor.   Cardiovascular:  Negative for chest pain, palpitations and leg swelling.  Gastrointestinal:  Negative for abdominal pain, constipation, diarrhea, heartburn, nausea and vomiting.  Genitourinary:  Negative for dysuria, frequency and urgency.  Musculoskeletal:  Negative for myalgias.  Skin:  Negative for rash.  Neurological:  Negative for dizziness, tingling, seizures, weakness and headaches.  Psychiatric/Behavioral:  Negative for depression, substance abuse and suicidal ideas. The patient is not nervous/anxious.       Objective:     BP 134/82   Pulse 83   Temp 97.7 F (36.5 C) (Temporal)   Ht 5' 6 (1.676 m)   Wt 244 lb (110.7 kg)   SpO2 95%   BMI 39.38 kg/m  BP Readings from Last 3 Encounters:  09/10/24 134/82  06/06/24 112/62  04/10/24 122/82   Wt Readings from Last 3 Encounters:  09/10/24 244 lb (110.7 kg)  06/06/24 243 lb (110.2 kg)  05/03/24 244 lb (110.7 kg)      Physical Exam Vitals and nursing note reviewed.  Constitutional:      Appearance: Normal appearance.  HENT:     Head: Normocephalic and atraumatic.     Right Ear: Tympanic membrane, ear canal and external ear normal.     Left Ear: Tympanic membrane, ear canal and external ear normal.     Nose: Nose normal.     Mouth/Throat:     Mouth: Mucous membranes are moist.     Pharynx: Oropharynx is clear.  Eyes:     Conjunctiva/sclera: Conjunctivae normal.     Pupils: Pupils are equal, round, and reactive to light.  Cardiovascular:     Rate and Rhythm: Normal rate and regular rhythm.     Pulses: Normal pulses.     Heart sounds: Normal heart sounds.  Pulmonary:     Effort: Pulmonary effort is normal.     Breath sounds: Normal breath sounds.  Abdominal:     General: Abdomen is flat. Bowel sounds are normal.     Palpations: Abdomen is soft.  Musculoskeletal:        General: Normal range of motion.      Cervical back: Normal range of motion.  Skin:    General: Skin is warm and dry.     Capillary Refill: Capillary refill takes less than 2 seconds.  Neurological:     General: No focal deficit present.     Mental Status: She is alert and oriented to person, place, and time. Mental status is at  baseline.  Psychiatric:        Mood and Affect: Mood normal.        Behavior: Behavior normal.        Thought Content: Thought content normal.        Judgment: Judgment normal.     No results found for any visits on 09/10/24.     The 10-year ASCVD risk score (Arnett DK, et al., 2019) is: 8.4%    Assessment & Plan:  Encounter for screening and preventative care Assessment & Plan: Immunizations tdap given today by the pharmacy clinic; other UTD. Mammogram due, orders placed. Colonoscopy UTD Dexa - UTD.  Discussed the importance of a healthy diet and regular exercise in order for weight loss, and to reduce the risk of further co-morbidity.  Exam stable. Labs pending.  Follow up in 1 year for repeat physical.   Orders: -     CBC -     Comprehensive metabolic panel with GFR -     Lipid panel -     TSH  GAD (generalized anxiety disorder) Assessment & Plan: Slightly worse.  Increase hydroxyzine  to 25-50 mg PRN. Refill sent. Consider buspar.   Orders: -     hydrOXYzine  Pamoate; Take 1-2 capsules (25-50 mg total) by mouth every 8 (eight) hours as needed for anxiety.  Dispense: 30 capsule; Refill: 2  Encounter for screening mammogram for malignant neoplasm of breast -     MM 3D DIAGNOSTIC MAMMOGRAM BILATERAL BREAST; Future  Vitamin D  deficiency -     VITAMIN D  25 Hydroxy (Vit-D Deficiency, Fractures)  Class 2 severe obesity due to excess calories with serious comorbidity and body mass index (BMI) of 39.0 to 39.9 in adult Assessment & Plan: Continue zepbound  12.5 mg weekly.  Discussed the importance of healthy diet and exercise to affect sustainable weight loss.    Orders: -     Hemoglobin A1c  Chronic anemia Assessment & Plan: Cbc and iron studies pending.  Orders: -     IBC + Ferritin  Alcohol abuse Assessment & Plan: Controlled.   Depressive disorder Assessment & Plan: Slightly better. Denies si/hi.  Declines therapy referral.   Continue cymbalta  120 mg once daily.  Increase hydroxyzine  to 25-50 mg as needed. Rx sent.   Essential hypertension Assessment & Plan: Controlled.  Continue lisinopril  and hydrochlorothiazide .   Discussed monitoring diet and exercise.    Return in about 6 months (around 03/10/2025) for chronic care management.    Carrol Aurora, NP

## 2024-09-10 NOTE — Assessment & Plan Note (Signed)
 Controlled.  Continue lisinopril  and hydrochlorothiazide .   Discussed monitoring diet and exercise.

## 2024-09-10 NOTE — Assessment & Plan Note (Signed)
 Slightly better. Denies si/hi.  Declines therapy referral.   Continue cymbalta  120 mg once daily.  Increase hydroxyzine  to 25-50 mg as needed. Rx sent.

## 2024-09-10 NOTE — Assessment & Plan Note (Addendum)
 Continue zepbound  12.5 mg weekly.  Discussed the importance of healthy diet and exercise to affect sustainable weight loss.

## 2024-09-10 NOTE — Assessment & Plan Note (Signed)
 Slightly worse.  Increase hydroxyzine  to 25-50 mg PRN. Refill sent. Consider buspar.

## 2024-09-11 ENCOUNTER — Other Ambulatory Visit: Payer: Self-pay

## 2024-09-11 ENCOUNTER — Other Ambulatory Visit (HOSPITAL_COMMUNITY): Payer: Self-pay

## 2024-09-12 ENCOUNTER — Encounter: Admitting: Occupational Therapy

## 2024-09-16 ENCOUNTER — Telehealth: Payer: Self-pay

## 2024-09-16 DIAGNOSIS — E66812 Obesity, class 2: Secondary | ICD-10-CM

## 2024-09-16 MED ORDER — ZEPBOUND 15 MG/0.5ML ~~LOC~~ SOAJ: 15.0000 mg | SUBCUTANEOUS | 2 refills | Status: AC

## 2024-09-16 MED ORDER — ZEPBOUND 15 MG/0.5ML ~~LOC~~ SOAJ: 15.0000 mg | SUBCUTANEOUS | 2 refills | Status: DC

## 2024-09-16 NOTE — Telephone Encounter (Signed)
 Copied from CRM #[id number]. Topic: Clinical - Medication Question >> Sep 16, 2024 10:48 AM Victoria A wrote: Reason for CRM: Patient would like for Zepbound  15 MG to be called in with new prescription to [facility] [place], [place] - [id number] [place] [phone] price will expire on 09/21/24-patient would like to be notified was completed

## 2024-09-16 NOTE — Telephone Encounter (Signed)
 Patient is requesting the 15 mg of zepbound  be sent in; okay to send?

## 2024-09-16 NOTE — Addendum Note (Signed)
 Addended by: TENNIE RAISIN B on: 09/16/2024 04:32 PM   Modules accepted: Orders

## 2024-09-16 NOTE — Telephone Encounter (Signed)
 Rx sent for zepbound  15 mg once weekly to [facility].  Please update patient.

## 2024-09-16 NOTE — Telephone Encounter (Signed)
 Patient notified rx was sent in as requested.

## 2024-09-16 NOTE — Addendum Note (Signed)
 Addended by: VINCENTE SHIVERS on: 09/16/2024 11:23 AM   Modules accepted: Orders

## 2024-09-17 ENCOUNTER — Encounter: Admitting: Occupational Therapy

## 2024-09-18 ENCOUNTER — Encounter: Admitting: Occupational Therapy

## 2024-09-24 DIAGNOSIS — Z1231 Encounter for screening mammogram for malignant neoplasm of breast: Secondary | ICD-10-CM | POA: Diagnosis not present

## 2024-09-24 LAB — HM MAMMOGRAPHY

## 2024-09-25 ENCOUNTER — Encounter: Payer: Self-pay | Admitting: General Practice

## 2024-09-26 ENCOUNTER — Ambulatory Visit: Payer: Self-pay | Admitting: General Practice

## 2024-09-26 ENCOUNTER — Encounter: Admitting: Occupational Therapy

## 2024-10-02 ENCOUNTER — Other Ambulatory Visit (HOSPITAL_COMMUNITY): Payer: Self-pay

## 2024-10-02 DIAGNOSIS — M62831 Muscle spasm of calf: Secondary | ICD-10-CM | POA: Diagnosis not present

## 2024-10-02 DIAGNOSIS — M150 Primary generalized (osteo)arthritis: Secondary | ICD-10-CM | POA: Diagnosis not present

## 2024-10-02 DIAGNOSIS — G894 Chronic pain syndrome: Secondary | ICD-10-CM | POA: Diagnosis not present

## 2024-10-02 DIAGNOSIS — M25551 Pain in right hip: Secondary | ICD-10-CM | POA: Diagnosis not present

## 2024-10-02 MED ORDER — TIZANIDINE HCL 2 MG PO TABS
2.0000 mg | ORAL_TABLET | Freq: Three times a day (TID) | ORAL | 2 refills | Status: AC | PRN
  Filled 2024-10-02: qty 90, 30d supply, fill #0

## 2024-10-02 MED ORDER — MORPHINE SULFATE ER 30 MG PO TBCR
30.0000 mg | EXTENDED_RELEASE_TABLET | Freq: Two times a day (BID) | ORAL | 0 refills | Status: AC
  Filled 2024-10-15: qty 60, 30d supply, fill #0

## 2024-10-02 MED ORDER — MORPHINE SULFATE ER 30 MG PO TBCR: 30.0000 mg | EXTENDED_RELEASE_TABLET | Freq: Two times a day (BID) | ORAL | 0 refills | Status: AC

## 2024-10-02 MED ORDER — TRAZODONE HCL 50 MG PO TABS
50.0000 mg | ORAL_TABLET | Freq: Every day | ORAL | 2 refills | Status: AC
  Filled 2024-10-02: qty 30, 30d supply, fill #0

## 2024-10-03 ENCOUNTER — Encounter: Admitting: Occupational Therapy

## 2024-10-03 DIAGNOSIS — L4050 Arthropathic psoriasis, unspecified: Secondary | ICD-10-CM | POA: Diagnosis not present

## 2024-10-04 ENCOUNTER — Other Ambulatory Visit (HOSPITAL_COMMUNITY): Payer: Self-pay

## 2024-10-08 ENCOUNTER — Other Ambulatory Visit: Payer: Self-pay | Admitting: General Practice

## 2024-10-08 DIAGNOSIS — I10 Essential (primary) hypertension: Secondary | ICD-10-CM

## 2024-10-10 ENCOUNTER — Encounter: Admitting: Occupational Therapy

## 2024-10-12 ENCOUNTER — Other Ambulatory Visit (HOSPITAL_COMMUNITY): Payer: Self-pay

## 2024-10-15 ENCOUNTER — Other Ambulatory Visit (HOSPITAL_COMMUNITY): Payer: Self-pay

## 2024-10-15 ENCOUNTER — Other Ambulatory Visit: Payer: Self-pay

## 2024-10-21 ENCOUNTER — Other Ambulatory Visit: Payer: Self-pay | Admitting: General Practice

## 2024-10-21 DIAGNOSIS — F32A Depression, unspecified: Secondary | ICD-10-CM

## 2024-10-22 ENCOUNTER — Ambulatory Visit: Payer: Self-pay

## 2024-10-22 ENCOUNTER — Encounter: Admitting: Occupational Therapy

## 2024-10-22 NOTE — Telephone Encounter (Signed)
 FYI Only or Action Required?: Action required by provider: request for appointment and declines UC/ED.  Patient was last seen in primary care on 09/10/2024 by Vincente Shivers, NP.  Called Nurse Triage reporting Abdominal Pain.  Symptoms began several days ago.  Interventions attempted: OTC medications: rolaids.  Symptoms are: unchanged.  Triage Disposition: See HCP Within 4 Hours (Or PCP Triage)  Patient/caregiver understands and will follow disposition?: No, wishes to speak with PCP   Copied from CRM #[id number]. Topic: Clinical - Red Word Triage >> Oct 22, 2024 10:47 AM Nessti S wrote: [facility] that prompted transfer to Nurse Triage: green mucous Reason for Disposition  [1] MILD-MODERATE pain AND [2] constant AND [3] present > 2 hours  Answer Assessment - Initial Assessment Questions No available appts within 4 hours. Advised UC/ED now and 911 if symptoms worsen: severe diff breathing, chest pain, faint, severe abd pain >1 hour. Patient verbalized understanding.  Patient declines UC/ED and requests appt for tomorrow.  1. LOCATION: Where does it hurt?      Epigastric; up to bra line 2. RADIATION: Does the pain shoot anywhere else? (e.g., chest, back)     no 3. ONSET: When did the pain begin? (e.g., minutes, hours or days ago)      2 days ago 4. SUDDEN: Gradual or sudden onset?     unsure 5. PATTERN Does the pain come and go, or is it constant?     Comes and goes 6. SEVERITY: How bad is the pain?  (e.g., Scale 1-10; mild, moderate, or severe)     6/10; rolaids not helpful 7. RECURRENT SYMPTOM: Have you ever had this type of stomach pain before? If Yes, ask: When was the last time? and What happened that time?      No, years ago 8. CAUSE: What do you think is causing the stomach pain? (e.g., gallstones, recent abdominal surgery)     Ulcer 9. RELIEVING/AGGRAVATING FACTORS: What makes it better or worse? (e.g., antacids, bending or twisting motion, bowel  movement)     Caffeine makes worse 10. OTHER SYMPTOMS: Do you have any other symptoms? (e.g., back pain, diarrhea, fever, urination pain, vomiting)   Denies diff breathing, chest pain(neck, jaw, arm, shoulder), back pain, faint, sweating, fever, chills, n/v/d Last BM yesterday, normal; denies blood, mucous or black tarry Denies distended abd, hard to touch Reports tender to touch,  Protocols used: Abdominal Pain - [facility]

## 2024-10-22 NOTE — Telephone Encounter (Signed)
 Spoke with patient and schedule appt to come in tomorrow at 11:40 am for eval

## 2024-10-23 ENCOUNTER — Ambulatory Visit (INDEPENDENT_AMBULATORY_CARE_PROVIDER_SITE_OTHER): Admitting: General Practice

## 2024-10-23 ENCOUNTER — Ambulatory Visit (INDEPENDENT_AMBULATORY_CARE_PROVIDER_SITE_OTHER)
Admission: RE | Admit: 2024-10-23 | Discharge: 2024-10-23 | Disposition: A | Discharge status: Home / Self Care | Source: Ambulatory Visit | Attending: General Practice | Admitting: General Practice

## 2024-10-23 ENCOUNTER — Encounter: Payer: Self-pay | Admitting: General Practice

## 2024-10-23 ENCOUNTER — Encounter: Admitting: Occupational Therapy

## 2024-10-23 ENCOUNTER — Other Ambulatory Visit: Payer: Self-pay | Admitting: General Practice

## 2024-10-23 ENCOUNTER — Ambulatory Visit: Payer: Self-pay | Admitting: General Practice

## 2024-10-23 VITALS — BP 122/78 | HR 94 | Temp 97.4°F | Ht 66.0 in | Wt 233.6 lb

## 2024-10-23 DIAGNOSIS — R1010 Upper abdominal pain, unspecified: Secondary | ICD-10-CM | POA: Insufficient documentation

## 2024-10-23 DIAGNOSIS — F1010 Alcohol abuse, uncomplicated: Secondary | ICD-10-CM | POA: Diagnosis not present

## 2024-10-23 DIAGNOSIS — J069 Acute upper respiratory infection, unspecified: Secondary | ICD-10-CM | POA: Insufficient documentation

## 2024-10-23 LAB — COMPREHENSIVE METABOLIC PANEL WITH GFR
ALT: 9 U/L (ref 3–35)
AST: 16 U/L (ref 5–37)
Albumin: 4.1 g/dL (ref 3.5–5.2)
Alkaline Phosphatase: 107 U/L (ref 39–117)
BUN: 8 mg/dL (ref 6–23)
CO2: 33 meq/L — ABNORMAL HIGH (ref 19–32)
Calcium: 9.2 mg/dL (ref 8.4–10.5)
Chloride: 98 meq/L (ref 96–112)
Creatinine, Ser: 0.81 mg/dL (ref 0.40–1.20)
GFR: 75.18 mL/min
Glucose, Bld: 106 mg/dL — ABNORMAL HIGH (ref 70–99)
Potassium: 4.1 meq/L (ref 3.5–5.1)
Sodium: 138 meq/L (ref 135–145)
Total Bilirubin: 0.7 mg/dL (ref 0.2–1.2)
Total Protein: 7 g/dL (ref 6.0–8.3)

## 2024-10-23 LAB — CBC WITH DIFFERENTIAL/PLATELET
Basophils Absolute: 0 K/uL (ref 0.0–0.1)
Basophils Relative: 0.7 % (ref 0.0–3.0)
Eosinophils Absolute: 0.4 K/uL (ref 0.0–0.7)
Eosinophils Relative: 5.6 % — ABNORMAL HIGH (ref 0.0–5.0)
HCT: 44.2 % (ref 36.0–46.0)
Hemoglobin: 14.6 g/dL (ref 12.0–15.0)
Lymphocytes Relative: 15.9 % (ref 12.0–46.0)
Lymphs Abs: 1 K/uL (ref 0.7–4.0)
MCHC: 33 g/dL (ref 30.0–36.0)
MCV: 100.6 fl — ABNORMAL HIGH (ref 78.0–100.0)
Monocytes Absolute: 1 K/uL (ref 0.1–1.0)
Monocytes Relative: 15.5 % — ABNORMAL HIGH (ref 3.0–12.0)
Neutro Abs: 3.9 K/uL (ref 1.4–7.7)
Neutrophils Relative %: 62.3 % (ref 43.0–77.0)
Platelets: 345 K/uL (ref 150.0–400.0)
RBC: 4.39 Mil/uL (ref 3.87–5.11)
RDW: 14.3 % (ref 11.5–15.5)
WBC: 6.3 K/uL (ref 4.0–10.5)

## 2024-10-23 LAB — LIPASE: Lipase: 12 U/L (ref 11.0–59.0)

## 2024-10-23 MED ORDER — PANTOPRAZOLE SODIUM 20 MG PO TBEC: 20.0000 mg | DELAYED_RELEASE_TABLET | Freq: Every day | ORAL | 0 refills | Status: DC

## 2024-10-23 NOTE — Progress Notes (Signed)
 "  Established Patient Office Visit  Subjective   Patient ID: [name], female    DOB: 11-28-1955  Age: 68 y.o. MRN: [id number]  Chief Complaint  Patient presents with   Abdominal Pain    Upper abdominal; patient thinks she is forming an ulcer. Patient has had the pain since Sunday. Patient has tried staying off diet mt dew since the pain started.     HPI  Discussed the use of AI scribe software for clinical note transcription with the patient, who gave verbal consent to proceed.  History of Present Illness [name] is a 68 year old female who presents with upper abdominal pain.  She has been experiencing dull, continuous upper abdominal pain located in the mid-epigastric area since Sunday night, without radiation. She has a history of an ulcer and initially thought the pain might be similar. She has not used Zepbound  in over a month and has consumed a small amount of wine recently, which did not exacerbate the pain. No nausea, vomiting, diarrhea, constipation, or blood in stool. She reports normal bowel movements and no fever or chills. She experiences significant fatigue.  She reports a cough producing sputum that started about a week ago, initially green and now pale yellow. She has used nasal spray once in the past few days and has a history of using Flonase without relief. No ear pain but reports a sore throat and sinus pain. She has not experienced dizziness or headaches. She has a history of smoking 30-40 years ago but has since quit. She received her flu and pneumonia shots this year.She has been using Theraflu for her cold symptoms and has tried Sudafed given by a friend. She has a history of using Afrin and is cautious about its use due to past experiences.     Patient Active Problem List   Diagnosis Date Noted   Pain of upper abdomen 10/23/2024   Upper respiratory tract infection 10/23/2024   Encounter for screening and preventative care 09/10/2024   Venous stasis  ulcer of other part of left lower leg limited to breakdown of skin with varicose veins (HCC) 12/28/2023   Stage 3a chronic kidney disease (HCC) 12/28/2023   Essential hypertension 02/14/2022   ADHD, 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   Insomnia    Psoriatic arthritis (HCC)    Chronic anemia 12/05/2015   Hoarding disorder 03/05/2015   Alcohol abuse 11/21/2014   Obesity 11/21/2014   Hematemesis 03/07/2014   Depressive disorder 12/09/2013   GAD (generalized anxiety disorder) 09/17/2013   Fibromyalgia 07/22/2013   Past Medical History:  Diagnosis Date   Allergy    Anemia    Anxiety    Arthritis    Chronic venous insufficiency 12/04/2015   Depression    Fibromyalgia    History of chicken pox    HTN (hypertension)    MRSA (methicillin resistant Staphylococcus aureus)    Obese    Plantar fasciitis    Pneumonia    Seasonal allergies    Sleep apnea 2012   took test in [place]-told to use cpap-could not ude   Past Surgical History:  Procedure Laterality Date   ANTERIOR CERVICAL DECOMP/DISCECTOMY FUSION  05/09/2008   C5-6, C6-7; with arthrodesis also   GASTRIC BYPASS  10/24/2000   KNEE ARTHROSCOPY  10/24/2002   rt and lt knee   ROTATOR CUFF REPAIR W/ DISTAL CLAVICLE EXCISION  01/06/2006   right   SHOULDER ARTHROSCOPY Left 10/24/2001   TONSILLECTOMY     TOTAL  KNEE ARTHROPLASTY  03/29/2004   left   TOTAL KNEE ARTHROPLASTY  11/07/2003   right   TOTAL KNEE REVISION  07/30/2010   right; with lateral release   UPPER GASTROINTESTINAL ENDOSCOPY     Allergies[1]       09/10/2024   11:25 AM 06/06/2024   11:13 AM 05/03/2024    1:59 PM  Depression screen PHQ 2/9  Decreased Interest 2 1 1   Down, Depressed, Hopeless 2 1 1   PHQ - 2 Score 4 2 2   Altered sleeping 2 1 0  Tired, decreased energy 2 3 1   Change in appetite 1 1 1   Feeling bad or failure about yourself  1 1 0  Trouble concentrating 1 1 0  Moving slowly or fidgety/restless 0 0 0  Suicidal thoughts 0 0 0   PHQ-9 Score 11 9  4    Difficult doing work/chores Somewhat difficult Not difficult at all Somewhat difficult     Data saved with a previous flowsheet row definition       09/10/2024   11:26 AM 06/06/2024   11:13 AM 04/10/2024   12:24 PM 03/11/2024   11:41 AM  GAD 7 : Generalized Anxiety Score  Nervous, Anxious, on Edge 2 1 1  0  Control/stop worrying 1 0 0 0  Worry too much - different things 1 1 1 1   Trouble relaxing 1 0 1 0  Restless 0 0 1 0  Easily annoyed or irritable 0 0 1 0  Afraid - awful might happen 0 0 0 0  Total GAD 7 Score 5 2 5 1   Anxiety Difficulty Not difficult at all Not difficult at all Not difficult at all Not difficult at all      Review of Systems  Constitutional:  Positive for malaise/fatigue. Negative for chills and fever.  HENT:  Positive for congestion, sinus pain and sore throat. Negative for ear pain.   Respiratory:  Positive for cough. Negative for shortness of breath.   Cardiovascular:  Negative for chest pain.  Gastrointestinal:  Positive for abdominal pain and heartburn. Negative for blood in stool, constipation, diarrhea, nausea and vomiting.  Genitourinary:  Negative for dysuria, frequency and urgency.  Neurological:  Negative for dizziness and headaches.  Endo/Heme/Allergies:  Negative for polydipsia.  Psychiatric/Behavioral:  Negative for depression and suicidal ideas. The patient is not nervous/anxious.       Objective:     BP 122/78   Pulse 94   Temp (!) 97.4 F (36.3 C) (Temporal)   Ht 5' 6 (1.676 m)   Wt 233 lb 9.6 oz (106 kg)   SpO2 96%   BMI 37.70 kg/m  BP Readings from Last 3 Encounters:  10/23/24 122/78  09/10/24 134/82  06/06/24 112/62   Wt Readings from Last 3 Encounters:  10/23/24 233 lb 9.6 oz (106 kg)  09/10/24 244 lb (110.7 kg)  06/06/24 243 lb (110.2 kg)      Physical Exam Vitals and nursing note reviewed.  Constitutional:      Appearance: Normal appearance. She is ill-appearing.  Cardiovascular:      Rate and Rhythm: Normal rate and regular rhythm.     Pulses: Normal pulses.     Heart sounds: Normal heart sounds.  Pulmonary:     Effort: Pulmonary effort is normal.     Breath sounds: Normal breath sounds.  Abdominal:     General: Bowel sounds are normal. There is no distension.     Palpations: Abdomen is soft. There is no mass.  Tenderness: There is abdominal tenderness in the epigastric area. There is no right CVA tenderness, left CVA tenderness, guarding or rebound.     Hernia: No hernia is present.  Neurological:     Mental Status: She is alert and oriented to person, place, and time.  Psychiatric:        Mood and Affect: Mood normal.        Behavior: Behavior normal.        Thought Content: Thought content normal.        Judgment: Judgment normal.      No results found for any visits on 10/23/24.     The 10-year ASCVD risk score (Arnett DK, et al., 2019) is: 6.5%    Assessment & Plan:  Pain of upper abdomen -     CBC with Differential/Platelet -     Comprehensive metabolic panel with GFR -     Pantoprazole Sodium; Take 1 tablet (20 mg total) by mouth daily.  Dispense: 30 tablet; Refill: 0 -     Lipase -     DG Abd 2 Views  Upper respiratory tract infection, unspecified type  Alcohol abuse    Assessment and Plan Assessment & Plan Evaluation of upper abdominal pain, rule out gastritis or pancreatitis Upper abdominal pain with differential diagnosis of gastritis and pancreatitis. Concern for pancreatitis due to recent alcohol use and Zepbound . Discussed potential hospitalization and management if pancreatitis confirmed. - Abd x-ray STAT. - CBC with diff, CMP, Lipase STAT pending. - Prescribed Protonix 20 mg once daily before breakfast or lunch. - Recommended Align probiotic daily. - Strict ER precautions given.   Acute upper respiratory infection Symptoms include cough, nasal congestion, sore throat, and sinus pain. Lungs clear.  - Recommended Flonase for  nasal congestion.  - Suggested Claritin  or Zyrtec for allergy symptoms. - Recommended Sudafed for cold and sinus symptoms. - Advised use of Coolmist humidifier at night. - Recommended Mucinex  plain twice daily for chest congestion. - Instructed to update on symptoms by Monday for potential antibiotic prescription.  Alcohol use disorder Recent alcohol consumption noted. Discussed risks of alcohol use with Zepbound , including potential for pancreatitis. Advised to avoid alcohol. - Advised to avoid alcohol consumption.    Return if symptoms worsen or fail to improve.    Carrol Aurora, NP    [1]  Allergies Allergen Reactions   Infliximab Other (See Comments)   "

## 2024-10-23 NOTE — Patient Instructions (Addendum)
 Start Protonix 20 mg once daily before breakfast.   Start Align once daily. Probiotic.   You can try a few things over the counter to help with your symptoms including:  Cough: Delsym  or Robitussin (get the off brand, works just as well) Chest Congestion: Mucinex  (plain) twice daily.  Nasal Congestion/Ear Pressure/Sinus Pressure: Try using Flonase (fluticasone) nasal spray. Instill 1 spray in each nostril twice daily. This can be purchased over the counter. Body aches, fevers, headache: Ibuprofen (not to exceed 2400 mg in 24 hours) or Acetaminophen -Tylenol  (not to exceed 3000 mg in 24 hours) Runny Nose/Throat Drainage/Sneezing/Itchy or Watery Eyes: An antihistamine such as Zyrtec, Claritin , Xyzal, Allegra  Update me on Monday regarding your congestion.   Increase fluid intake, rest and use cool-mist humidifier.   Follow up with me in 4 weeks for abdominal pain.   It was a pleasure to see you today!

## 2024-10-28 ENCOUNTER — Ambulatory Visit: Payer: Self-pay

## 2024-10-28 DIAGNOSIS — R1010 Upper abdominal pain, unspecified: Secondary | ICD-10-CM

## 2024-10-28 NOTE — Telephone Encounter (Signed)
 Pt states that she was calling to tell her PCP that her cold is almost gone, but states that her abd pain has not changed, not better, not worse.  Pt states that she has cut out alcohol completely and has significantly limited her caffeine. Denies changes in BM habits, denies SOB, denies N/V.  Copied from CRM [id number]. Topic: Clinical - Red Word Triage >> Oct 28, 2024 11:51 AM [name] wrote: Red Word that prompted transfer to Nurse Triage: Pt calling, states was seen in office on 10/23/24, and was advised to call back regarding update status of abdominal pain.  Pt reports pain is not getting any better since seen on 10/23/24.  Pt is requesting medical advice.  CB# [phone]

## 2024-10-28 NOTE — Addendum Note (Signed)
 Addended by: VINCENTE SHIVERS on: 10/28/2024 04:24 PM   Modules accepted: Orders

## 2024-10-28 NOTE — Telephone Encounter (Signed)
 Spoke with patient and she has been taking the stool softners as told 2 am and 2 pm; she's done miralax  twice since her visit and drinking nothing but water as that's all she seems to be able tolerate. Patient states she has been going to the bathroom but only in the middle of the night every night; states it wakes her up. No diarrhea as its been solid everytime she goes. Patient states she's just in a lot of pain where she showed Bableen at her visit.

## 2024-10-28 NOTE — Telephone Encounter (Signed)
 Patient okay doing scan and does not have preference on location.

## 2024-10-31 ENCOUNTER — Encounter: Admitting: Occupational Therapy

## 2024-11-07 ENCOUNTER — Encounter: Admitting: Occupational Therapy

## 2024-11-08 ENCOUNTER — Ambulatory Visit
Admission: RE | Admit: 2024-11-08 | Discharge: 2024-11-08 | Disposition: A | Discharge status: Home / Self Care | Source: Ambulatory Visit | Attending: General Practice | Admitting: General Practice

## 2024-11-08 DIAGNOSIS — R1010 Upper abdominal pain, unspecified: Secondary | ICD-10-CM | POA: Insufficient documentation

## 2024-11-11 ENCOUNTER — Ambulatory Visit: Payer: Self-pay | Admitting: General Practice

## 2024-11-14 ENCOUNTER — Encounter: Admitting: Occupational Therapy

## 2024-11-14 ENCOUNTER — Other Ambulatory Visit: Payer: Self-pay | Admitting: General Practice

## 2024-11-14 DIAGNOSIS — F32A Depression, unspecified: Secondary | ICD-10-CM

## 2024-11-21 NOTE — Telephone Encounter (Signed)
 Copied from CRM #[id number]. Topic: Clinical - Lab/Test Results >> Nov 21, 2024  3:48 PM Rea C wrote: Reason for CRM: Pt would like to receive a call to go over CT scan results.

## 2024-11-22 NOTE — Telephone Encounter (Signed)
 Read Message to patient she states she does not have access to her MyChart.  States she will speak to provider at next visit regarding. There is a small umbilical hernia and aortic atherosclerosis.   states she does not know what those are, offered a call back from clinical team member.  Patient declined.

## 2025-03-10 ENCOUNTER — Ambulatory Visit: Admitting: General Practice

## 2025-05-06 ENCOUNTER — Ambulatory Visit
